# Patient Record
Sex: Male | Born: 1960 | Race: White | Hispanic: No | Marital: Married | Smoking: Never smoker
Health system: Southern US, Community
[De-identification: ages and names within clinical notes are randomized; demographics above are authoritative.]

## PROBLEM LIST (undated history)

## (undated) DIAGNOSIS — I2510 Atherosclerotic heart disease of native coronary artery without angina pectoris: Secondary | ICD-10-CM

## (undated) DIAGNOSIS — F445 Conversion disorder with seizures or convulsions: Secondary | ICD-10-CM

## (undated) DIAGNOSIS — K219 Gastro-esophageal reflux disease without esophagitis: Secondary | ICD-10-CM

## (undated) DIAGNOSIS — I69328 Other speech and language deficits following cerebral infarction: Secondary | ICD-10-CM

## (undated) DIAGNOSIS — E538 Deficiency of other specified B group vitamins: Secondary | ICD-10-CM

## (undated) DIAGNOSIS — T8859XA Other complications of anesthesia, initial encounter: Secondary | ICD-10-CM

## (undated) DIAGNOSIS — I499 Cardiac arrhythmia, unspecified: Secondary | ICD-10-CM

## (undated) DIAGNOSIS — Z923 Personal history of irradiation: Secondary | ICD-10-CM

## (undated) DIAGNOSIS — F449 Dissociative and conversion disorder, unspecified: Secondary | ICD-10-CM

## (undated) DIAGNOSIS — I82409 Acute embolism and thrombosis of unspecified deep veins of unspecified lower extremity: Secondary | ICD-10-CM

## (undated) DIAGNOSIS — I639 Cerebral infarction, unspecified: Secondary | ICD-10-CM

## (undated) DIAGNOSIS — J45909 Unspecified asthma, uncomplicated: Secondary | ICD-10-CM

## (undated) DIAGNOSIS — I219 Acute myocardial infarction, unspecified: Secondary | ICD-10-CM

## (undated) DIAGNOSIS — T4145XA Adverse effect of unspecified anesthetic, initial encounter: Secondary | ICD-10-CM

## (undated) DIAGNOSIS — R569 Unspecified convulsions: Secondary | ICD-10-CM

## (undated) DIAGNOSIS — R0600 Dyspnea, unspecified: Secondary | ICD-10-CM

## (undated) DIAGNOSIS — E119 Type 2 diabetes mellitus without complications: Secondary | ICD-10-CM

## (undated) DIAGNOSIS — G4730 Sleep apnea, unspecified: Secondary | ICD-10-CM

## (undated) DIAGNOSIS — R251 Tremor, unspecified: Secondary | ICD-10-CM

## (undated) DIAGNOSIS — M792 Neuralgia and neuritis, unspecified: Secondary | ICD-10-CM

## (undated) DIAGNOSIS — C189 Malignant neoplasm of colon, unspecified: Secondary | ICD-10-CM

## (undated) DIAGNOSIS — E785 Hyperlipidemia, unspecified: Secondary | ICD-10-CM

## (undated) DIAGNOSIS — Z87442 Personal history of urinary calculi: Secondary | ICD-10-CM

## (undated) HISTORY — PX: TONSILLECTOMY: SUR1361

---
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
## 1988-07-31 DIAGNOSIS — Z87442 Personal history of urinary calculi: Secondary | ICD-10-CM

## 1988-07-31 DIAGNOSIS — I219 Acute myocardial infarction, unspecified: Secondary | ICD-10-CM

## 1988-07-31 DIAGNOSIS — C189 Malignant neoplasm of colon, unspecified: Secondary | ICD-10-CM

## 1988-07-31 HISTORY — DX: Personal history of urinary calculi: Z87.442

## 1988-07-31 HISTORY — DX: Acute myocardial infarction, unspecified: I21.9

## 1988-07-31 HISTORY — DX: Malignant neoplasm of colon, unspecified: C18.9

## 2010-04-06 ENCOUNTER — Inpatient Hospital Stay: Payer: Self-pay | Admitting: Internal Medicine

## 2010-04-28 ENCOUNTER — Ambulatory Visit: Payer: Self-pay | Admitting: Family Medicine

## 2010-05-09 ENCOUNTER — Ambulatory Visit: Payer: Self-pay | Admitting: Internal Medicine

## 2010-12-13 ENCOUNTER — Inpatient Hospital Stay: Payer: Self-pay | Admitting: Family Medicine

## 2011-04-07 ENCOUNTER — Emergency Department: Payer: Self-pay | Admitting: *Deleted

## 2011-08-06 ENCOUNTER — Emergency Department: Payer: Self-pay | Admitting: Emergency Medicine

## 2011-08-06 LAB — CBC
HCT: 46.8 % (ref 40.0–52.0)
HGB: 16 g/dL (ref 13.0–18.0)
MCH: 31.7 pg (ref 26.0–34.0)
MCHC: 34.1 g/dL (ref 32.0–36.0)
MCV: 93 fL (ref 80–100)
Platelet: 249 10*3/uL (ref 150–440)
RBC: 5.04 10*6/uL (ref 4.40–5.90)

## 2011-08-06 LAB — COMPREHENSIVE METABOLIC PANEL
Albumin: 4 g/dL (ref 3.4–5.0)
Anion Gap: 14 (ref 7–16)
BUN: 16 mg/dL (ref 7–18)
Bilirubin,Total: 0.5 mg/dL (ref 0.2–1.0)
Chloride: 104 mmol/L (ref 98–107)
Creatinine: 1.36 mg/dL — ABNORMAL HIGH (ref 0.60–1.30)
EGFR (African American): >60
EGFR (Non-African Amer.): 59 — ABNORMAL LOW
Glucose: 111 mg/dL — ABNORMAL HIGH (ref 65–99)
Osmolality: 279 (ref 275–301)
Potassium: 3.7 mmol/L (ref 3.5–5.1)
SGOT(AST): 24 U/L (ref 15–37)
Sodium: 139 mmol/L (ref 136–145)
Total Protein: 7.9 g/dL (ref 6.4–8.2)

## 2011-08-06 LAB — MAGNESIUM: Magnesium: 1.7 mg/dL — ABNORMAL LOW

## 2011-08-06 LAB — RAPID INFLUENZA A&B ANTIGENS

## 2011-08-06 LAB — CK TOTAL AND CKMB (NOT AT ARMC): CK-MB: <0.5 ng/mL — ABNORMAL LOW (ref 0.5–3.6)

## 2011-08-06 LAB — TROPONIN I: Troponin-I: <0.02 ng/mL

## 2012-01-07 ENCOUNTER — Emergency Department: Payer: Self-pay | Admitting: Emergency Medicine

## 2012-01-07 LAB — COMPREHENSIVE METABOLIC PANEL
Albumin: 3.6 g/dL (ref 3.4–5.0)
Alkaline Phosphatase: 61 U/L (ref 50–136)
Anion Gap: 12 (ref 7–16)
BUN: 10 mg/dL (ref 7–18)
Bilirubin,Total: 0.4 mg/dL (ref 0.2–1.0)
Chloride: 106 mmol/L (ref 98–107)
Co2: 22 mmol/L (ref 21–32)
Creatinine: 1.07 mg/dL (ref 0.60–1.30)
Osmolality: 277 (ref 275–301)
SGPT (ALT): 37 U/L
Sodium: 140 mmol/L (ref 136–145)
Total Protein: 6.7 g/dL (ref 6.4–8.2)

## 2012-01-07 LAB — URINALYSIS, COMPLETE
Bacteria: NONE SEEN
Glucose,UR: NEGATIVE mg/dL (ref 0–75)
Leukocyte Esterase: NEGATIVE
Nitrite: NEGATIVE
Ph: 7 (ref 4.5–8.0)
Protein: NEGATIVE
RBC,UR: NONE SEEN /HPF (ref 0–5)
Specific Gravity: 1.014 (ref 1.003–1.030)
Squamous Epithelial: NONE SEEN
WBC UR: <1 /HPF (ref 0–5)

## 2012-01-07 LAB — CBC
HCT: 41.1 % (ref 40.0–52.0)
MCH: 30.9 pg (ref 26.0–34.0)
MCHC: 33.4 g/dL (ref 32.0–36.0)
MCV: 92 fL (ref 80–100)
Platelet: 224 10*3/uL (ref 150–440)
RBC: 4.45 10*6/uL (ref 4.40–5.90)
WBC: 7 10*3/uL (ref 3.8–10.6)

## 2012-01-07 LAB — TROPONIN I: Troponin-I: <0.02 ng/mL

## 2012-08-20 ENCOUNTER — Emergency Department: Payer: Self-pay | Admitting: Emergency Medicine

## 2012-08-20 LAB — BASIC METABOLIC PANEL
Anion Gap: 10 (ref 7–16)
BUN: 12 mg/dL (ref 7–18)
Calcium, Total: 9 mg/dL (ref 8.5–10.1)
Chloride: 108 mmol/L — ABNORMAL HIGH (ref 98–107)
Co2: 23 mmol/L (ref 21–32)
Creatinine: 1.17 mg/dL (ref 0.60–1.30)
EGFR (African American): >60
EGFR (Non-African Amer.): >60
Glucose: 70 mg/dL (ref 65–99)
Osmolality: 279 (ref 275–301)
Sodium: 141 mmol/L (ref 136–145)

## 2012-08-20 LAB — CBC WITH DIFFERENTIAL/PLATELET
Basophil #: 0.1 10*3/uL (ref 0.0–0.1)
Eosinophil %: 1.7 %
HCT: 44.7 % (ref 40.0–52.0)
HGB: 15.2 g/dL (ref 13.0–18.0)
Lymphocyte #: 2.3 10*3/uL (ref 1.0–3.6)
Lymphocyte %: 26.8 %
MCH: 31.2 pg (ref 26.0–34.0)
MCHC: 34.1 g/dL (ref 32.0–36.0)
MCV: 92 fL (ref 80–100)
Monocyte #: 0.9 x10 3/mm (ref 0.2–1.0)
Monocyte %: 10.1 %
Neutrophil %: 60.1 %
RDW: 13.4 % (ref 11.5–14.5)
WBC: 8.6 10*3/uL (ref 3.8–10.6)

## 2012-08-20 LAB — URINALYSIS, COMPLETE
Bacteria: NONE SEEN
Bilirubin,UR: NEGATIVE
Glucose,UR: 50 mg/dL (ref 0–75)
Nitrite: NEGATIVE
Ph: 6 (ref 4.5–8.0)
Protein: NEGATIVE
Specific Gravity: 1.021 (ref 1.003–1.030)
WBC UR: 2 /HPF (ref 0–5)

## 2012-08-20 LAB — DRUG SCREEN, URINE
Barbiturates, Ur Screen: NEGATIVE (ref ?–200)
Benzodiazepine, Ur Scrn: POSITIVE (ref ?–200)
Cocaine Metabolite,Ur ~~LOC~~: NEGATIVE (ref ?–300)
Methadone, Ur Screen: NEGATIVE (ref ?–300)
Phencyclidine (PCP) Ur S: NEGATIVE (ref ?–25)
Tricyclic, Ur Screen: POSITIVE (ref ?–1000)

## 2013-01-15 ENCOUNTER — Emergency Department: Payer: Self-pay

## 2013-01-15 LAB — URINALYSIS, COMPLETE
Bilirubin,UR: NEGATIVE
Blood: NEGATIVE
Glucose,UR: NEGATIVE mg/dL (ref 0–75)
Protein: NEGATIVE
Specific Gravity: 1.01 (ref 1.003–1.030)
Squamous Epithelial: <1
WBC UR: 2 /HPF (ref 0–5)

## 2013-01-15 LAB — TROPONIN I: Troponin-I: <0.02 ng/mL

## 2013-01-15 LAB — CBC
HCT: 44.2 % (ref 40.0–52.0)
MCHC: 34.3 g/dL (ref 32.0–36.0)
Platelet: 233 10*3/uL (ref 150–440)
RBC: 4.9 10*6/uL (ref 4.40–5.90)
WBC: 7.6 10*3/uL (ref 3.8–10.6)

## 2013-01-15 LAB — COMPREHENSIVE METABOLIC PANEL
Alkaline Phosphatase: 69 U/L (ref 50–136)
Anion Gap: 8 (ref 7–16)
Bilirubin,Total: 0.7 mg/dL (ref 0.2–1.0)
Chloride: 107 mmol/L (ref 98–107)
Co2: 25 mmol/L (ref 21–32)
EGFR (Non-African Amer.): >60
Glucose: 81 mg/dL (ref 65–99)
Osmolality: 278 (ref 275–301)
Potassium: 4 mmol/L (ref 3.5–5.1)
SGOT(AST): 14 U/L — ABNORMAL LOW (ref 15–37)
Sodium: 140 mmol/L (ref 136–145)
Total Protein: 7.3 g/dL (ref 6.4–8.2)

## 2013-01-15 LAB — AMMONIA: Ammonia, Plasma: 25 mcmol/L (ref 11–32)

## 2013-02-20 ENCOUNTER — Emergency Department: Payer: Self-pay | Admitting: Emergency Medicine

## 2013-02-20 LAB — CBC
HCT: 43.9 % (ref 40.0–52.0)
HGB: 15 g/dL (ref 13.0–18.0)
MCV: 91 fL (ref 80–100)
Platelet: 211 10*3/uL (ref 150–440)
RBC: 4.84 10*6/uL (ref 4.40–5.90)
WBC: 8 10*3/uL (ref 3.8–10.6)

## 2013-02-20 LAB — COMPREHENSIVE METABOLIC PANEL
Bilirubin,Total: 0.5 mg/dL (ref 0.2–1.0)
Calcium, Total: 9.7 mg/dL (ref 8.5–10.1)
Chloride: 103 mmol/L (ref 98–107)
Co2: 22 mmol/L (ref 21–32)
EGFR (African American): >60
Potassium: 3.7 mmol/L (ref 3.5–5.1)
SGOT(AST): 20 U/L (ref 15–37)
Sodium: 136 mmol/L (ref 136–145)

## 2013-02-20 LAB — TROPONIN I: Troponin-I: <0.02 ng/mL

## 2013-02-20 LAB — SEDIMENTATION RATE: Erythrocyte Sed Rate: 4 mm/hr (ref 0–20)

## 2013-02-20 LAB — APTT: Activated PTT: 26.6 secs (ref 23.6–35.9)

## 2013-02-20 LAB — PROTIME-INR: INR: 0.9

## 2013-11-22 ENCOUNTER — Ambulatory Visit: Payer: Self-pay | Admitting: Physician Assistant

## 2013-11-26 ENCOUNTER — Emergency Department: Payer: Self-pay | Admitting: Emergency Medicine

## 2013-11-26 LAB — BASIC METABOLIC PANEL
Anion Gap: 11 (ref 7–16)
BUN: 11 mg/dL (ref 7–18)
Calcium, Total: 9.3 mg/dL (ref 8.5–10.1)
Chloride: 100 mmol/L (ref 98–107)
Co2: 23 mmol/L (ref 21–32)
Creatinine: 1.03 mg/dL (ref 0.60–1.30)
EGFR (African American): >60
EGFR (Non-African Amer.): >60
GLUCOSE: 231 mg/dL — AB (ref 65–99)
OSMOLALITY: 275 (ref 275–301)
Potassium: 4.1 mmol/L (ref 3.5–5.1)
Sodium: 134 mmol/L — ABNORMAL LOW (ref 136–145)

## 2013-11-26 LAB — CBC
HCT: 49 % (ref 40.0–52.0)
HGB: 15.9 g/dL (ref 13.0–18.0)
MCH: 30.3 pg (ref 26.0–34.0)
MCHC: 32.4 g/dL (ref 32.0–36.0)
MCV: 93 fL (ref 80–100)
PLATELETS: 231 10*3/uL (ref 150–440)
RBC: 5.25 10*6/uL (ref 4.40–5.90)
RDW: 12.9 % (ref 11.5–14.5)
WBC: 8 10*3/uL (ref 3.8–10.6)

## 2013-11-26 LAB — URINALYSIS, COMPLETE
BILIRUBIN, UR: NEGATIVE
Bacteria: NONE SEEN
Glucose,UR: >=500 mg/dL (ref 0–75)
KETONE: NEGATIVE
Leukocyte Esterase: NEGATIVE
Nitrite: NEGATIVE
PH: 7 (ref 4.5–8.0)
PROTEIN: NEGATIVE
RBC,UR: NONE SEEN /HPF (ref 0–5)
Specific Gravity: 1.005 (ref 1.003–1.030)
Squamous Epithelial: NONE SEEN
WBC UR: <1 /HPF (ref 0–5)

## 2013-11-26 LAB — CARBAMAZEPINE LEVEL, TOTAL

## 2013-11-26 LAB — PHENYTOIN LEVEL, TOTAL: DILANTIN: 2 ug/mL — AB (ref 10.0–20.0)

## 2013-11-26 LAB — VALPROIC ACID LEVEL: Valproic Acid: <3 ug/mL — ABNORMAL LOW

## 2013-11-26 LAB — PHENOBARBITAL LEVEL: Phenobarbital: <2.1 ug/mL — ABNORMAL LOW (ref 15.0–40.0)

## 2014-01-19 ENCOUNTER — Emergency Department: Payer: Self-pay | Admitting: Emergency Medicine

## 2014-01-19 ENCOUNTER — Inpatient Hospital Stay (HOSPITAL_COMMUNITY)
Admission: AD | Admit: 2014-01-19 | Discharge: 2014-01-21 | DRG: 069 | Disposition: A | Discharge status: Home / Self Care | Payer: Medicare Other | Source: Other Acute Inpatient Hospital | Attending: Neurology | Admitting: Neurology

## 2014-01-19 ENCOUNTER — Inpatient Hospital Stay (HOSPITAL_COMMUNITY): Payer: Medicare Other

## 2014-01-19 ENCOUNTER — Encounter (HOSPITAL_COMMUNITY): Payer: Self-pay | Admitting: Neurology

## 2014-01-19 DIAGNOSIS — Z6832 Body mass index (BMI) 32.0-32.9, adult: Secondary | ICD-10-CM

## 2014-01-19 DIAGNOSIS — R4701 Aphasia: Secondary | ICD-10-CM | POA: Diagnosis present

## 2014-01-19 DIAGNOSIS — E119 Type 2 diabetes mellitus without complications: Secondary | ICD-10-CM | POA: Diagnosis present

## 2014-01-19 DIAGNOSIS — I252 Old myocardial infarction: Secondary | ICD-10-CM

## 2014-01-19 DIAGNOSIS — R29898 Other symptoms and signs involving the musculoskeletal system: Secondary | ICD-10-CM | POA: Diagnosis present

## 2014-01-19 DIAGNOSIS — G459 Transient cerebral ischemic attack, unspecified: Secondary | ICD-10-CM | POA: Diagnosis present

## 2014-01-19 DIAGNOSIS — I6330 Cerebral infarction due to thrombosis of unspecified cerebral artery: Secondary | ICD-10-CM | POA: Insufficient documentation

## 2014-01-19 DIAGNOSIS — I10 Essential (primary) hypertension: Secondary | ICD-10-CM | POA: Diagnosis present

## 2014-01-19 DIAGNOSIS — G4730 Sleep apnea, unspecified: Secondary | ICD-10-CM | POA: Diagnosis present

## 2014-01-19 DIAGNOSIS — R569 Unspecified convulsions: Secondary | ICD-10-CM | POA: Diagnosis present

## 2014-01-19 DIAGNOSIS — G458 Other transient cerebral ischemic attacks and related syndromes: Secondary | ICD-10-CM

## 2014-01-19 DIAGNOSIS — Z85038 Personal history of other malignant neoplasm of large intestine: Secondary | ICD-10-CM | POA: Diagnosis not present

## 2014-01-19 DIAGNOSIS — I2510 Atherosclerotic heart disease of native coronary artery without angina pectoris: Secondary | ICD-10-CM | POA: Diagnosis present

## 2014-01-19 DIAGNOSIS — E538 Deficiency of other specified B group vitamins: Secondary | ICD-10-CM | POA: Diagnosis present

## 2014-01-19 DIAGNOSIS — Z820 Family history of epilepsy and other diseases of the nervous system: Secondary | ICD-10-CM

## 2014-01-19 DIAGNOSIS — E785 Hyperlipidemia, unspecified: Secondary | ICD-10-CM | POA: Diagnosis present

## 2014-01-19 DIAGNOSIS — E669 Obesity, unspecified: Secondary | ICD-10-CM | POA: Diagnosis present

## 2014-01-19 DIAGNOSIS — I69998 Other sequelae following unspecified cerebrovascular disease: Secondary | ICD-10-CM

## 2014-01-19 HISTORY — DX: Sleep apnea, unspecified: G47.30

## 2014-01-19 HISTORY — DX: Type 2 diabetes mellitus without complications: E11.9

## 2014-01-19 HISTORY — DX: Atherosclerotic heart disease of native coronary artery without angina pectoris: I25.10

## 2014-01-19 HISTORY — DX: Deficiency of other specified B group vitamins: E53.8

## 2014-01-19 HISTORY — DX: Hyperlipidemia, unspecified: E78.5

## 2014-01-19 HISTORY — DX: Cerebral infarction, unspecified: I63.9

## 2014-01-19 HISTORY — DX: Malignant neoplasm of colon, unspecified: C18.9

## 2014-01-19 HISTORY — DX: Conversion disorder with seizures or convulsions: F44.5

## 2014-01-19 LAB — URINALYSIS, COMPLETE
BLOOD: NEGATIVE
Bacteria: NONE SEEN
Bilirubin,UR: NEGATIVE
Ketone: NEGATIVE
LEUKOCYTE ESTERASE: NEGATIVE
NITRITE: NEGATIVE
PROTEIN: NEGATIVE
Ph: 6 (ref 4.5–8.0)
RBC,UR: <1 /HPF (ref 0–5)
Specific Gravity: 1.023 (ref 1.003–1.030)
Squamous Epithelial: NONE SEEN
WBC UR: NONE SEEN /HPF (ref 0–5)

## 2014-01-19 LAB — COMPREHENSIVE METABOLIC PANEL
ALBUMIN: 3.5 g/dL (ref 3.4–5.0)
ALT: 29 U/L (ref 12–78)
ANION GAP: 7 (ref 7–16)
AST: 25 U/L (ref 15–37)
Alkaline Phosphatase: 74 U/L
BUN: 13 mg/dL (ref 7–18)
Bilirubin,Total: 0.5 mg/dL (ref 0.2–1.0)
CALCIUM: 9.6 mg/dL (ref 8.5–10.1)
Chloride: 107 mmol/L (ref 98–107)
Co2: 26 mmol/L (ref 21–32)
Creatinine: 1.06 mg/dL (ref 0.60–1.30)
Glucose: 141 mg/dL — ABNORMAL HIGH (ref 65–99)
Osmolality: 282 (ref 275–301)
POTASSIUM: 3.9 mmol/L (ref 3.5–5.1)
Sodium: 140 mmol/L (ref 136–145)
TOTAL PROTEIN: 6.8 g/dL (ref 6.4–8.2)

## 2014-01-19 LAB — CBC WITH DIFFERENTIAL/PLATELET
BASOS PCT: 0.9 %
Basophil #: 0.1 10*3/uL (ref 0.0–0.1)
Eosinophil #: 0.1 10*3/uL (ref 0.0–0.7)
Eosinophil %: 1.5 %
HCT: 46.6 % (ref 40.0–52.0)
HGB: 15.1 g/dL (ref 13.0–18.0)
LYMPHS ABS: 1.9 10*3/uL (ref 1.0–3.6)
LYMPHS PCT: 30.1 %
MCH: 30.6 pg (ref 26.0–34.0)
MCHC: 32.4 g/dL (ref 32.0–36.0)
MCV: 95 fL (ref 80–100)
Monocyte #: 0.7 x10 3/mm (ref 0.2–1.0)
Monocyte %: 11.1 %
Neutrophil #: 3.5 10*3/uL (ref 1.4–6.5)
Neutrophil %: 56.4 %
PLATELETS: 198 10*3/uL (ref 150–440)
RBC: 4.93 10*6/uL (ref 4.40–5.90)
RDW: 13.3 % (ref 11.5–14.5)
WBC: 6.3 10*3/uL (ref 3.8–10.6)

## 2014-01-19 LAB — TROPONIN I: Troponin-I: <0.02 ng/mL

## 2014-01-19 LAB — PROTIME-INR
INR: 0.9
Prothrombin Time: 12.5 secs (ref 11.5–14.7)

## 2014-01-19 LAB — MRSA PCR SCREENING: MRSA BY PCR: NEGATIVE

## 2014-01-19 LAB — GLUCOSE, CAPILLARY: GLUCOSE-CAPILLARY: 98 mg/dL (ref 70–99)

## 2014-01-19 MED ORDER — BIOTENE DRY MOUTH MT LIQD
15.0000 mL | Freq: Two times a day (BID) | OROMUCOSAL | Status: DC
Start: 2014-01-20 — End: 2014-01-20
  Administered 2014-01-20: 15 mL via OROMUCOSAL

## 2014-01-19 MED ORDER — INSULIN ASPART 100 UNIT/ML ~~LOC~~ SOLN: 0.0000 [IU] | Freq: Three times a day (TID) | SUBCUTANEOUS | Status: DC

## 2014-01-19 MED ORDER — PANTOPRAZOLE SODIUM 40 MG IV SOLR
40.0000 mg | Freq: Every day | INTRAVENOUS | Status: DC
  Administered 2014-01-19 – 2014-01-20 (×2): 40 mg via INTRAVENOUS
  Filled 2014-01-19 (×3): qty 40

## 2014-01-19 MED ORDER — SODIUM CHLORIDE 0.9 % IV SOLN
INTRAVENOUS | Status: DC
  Administered 2014-01-19 – 2014-01-21 (×3): via INTRAVENOUS

## 2014-01-19 MED ORDER — LABETALOL HCL 5 MG/ML IV SOLN: 10.0000 mg | INTRAVENOUS | Status: DC | PRN

## 2014-01-19 MED ORDER — SENNOSIDES-DOCUSATE SODIUM 8.6-50 MG PO TABS
1.0000 | ORAL_TABLET | Freq: Every evening | ORAL | Status: DC | PRN
  Filled 2014-01-19: qty 1

## 2014-01-19 MED ORDER — ACETAMINOPHEN 325 MG PO TABS: 650.0000 mg | ORAL_TABLET | ORAL | Status: DC | PRN

## 2014-01-19 MED ORDER — ACETAMINOPHEN 650 MG RE SUPP
650.0000 mg | RECTAL | Status: DC | PRN
  Administered 2014-01-19: 650 mg via RECTAL
  Filled 2014-01-19: qty 1

## 2014-01-19 MED ORDER — INSULIN ASPART 100 UNIT/ML ~~LOC~~ SOLN
0.0000 [IU] | SUBCUTANEOUS | Status: DC
  Administered 2014-01-20: 3 [IU] via SUBCUTANEOUS

## 2014-01-19 NOTE — H&P (Signed)
Admission H&P    Chief Complaint: Unable to speak HPI: Michael Gill is an 53 y.o. male with a history of stroke and pseudoseizures who was not feeling well today.  He had complaints of ha headache and numbness on the left side of his face which is his usual presentation for his pseudoseizures.  He then usually has weakness on the right side and starts to jerk.  Today instead of the jerking he became unable to speak.  It seems gait was affected as well.  Patient was evaluated at [facility] ED and IV tPA was administered.  Patient now transferred to [facility] for further care.  Modified NIHSS of 10.   The patient has a history of stroke three years ago with right sided weakness and effects on cognitive function.   went to the doctor's office and was able to ambulate with a walker and was able to talk  Date last known well: Date: 01/19/2014 Time last known well: Time: 15:45 tPA Given: Yes  Past Medical History  Diagnosis Date  . Pseudoseizure   . Diabetes mellitus   . Hyperlipidemia   . CVA (cerebral infarction)   . Colon cancer   . Sleep apnea   . B12 deficiency   . CAD (coronary artery disease)     s/p MI    Past Surgical History  Procedure Laterality Date  . Appendectomy    . Tonsillectomy     1 Family History  Problem Relation Age of Onset  . Cancer Brother   . Alzheimer's disease Mother     Social History:  reports that he has never smoked. He does not have any smokeless tobacco history on file. He reports that he does not drink alcohol or use illicit drugs.  Allergies: Codeine, sulfa drugs  Medications: Amitriptyline, ASA 81mg , Atorvastatin, Humalog, Loratadine, Losartan, Metformin, Trazadone    ROS: Unable to obtain-patient mute  Physical Examination: Blood pressure 126/78, pulse 79, temperature 98.2 F (36.8 C), temperature source Oral, resp. rate 16, height 5\' 10"  (1.778 m), weight 102 kg (224 lb 13.9 oz), SpO2 94.00%.  General Examination: HEENT-   Normocephalic, no lesions, without obvious abnormality.  Normal external eye and conjunctiva.  Normal TM's bilaterally.  Normal auditory canals and external ears. Normal external nose, mucus membranes and septum.  Normal pharynx. Neck supple with no masses, nodes, nodules or enlargement. Cardiovascular - S1, S2 normal Lungs - chest clear, no wheezing, rales, normal symmetric air entry Abdomen - soft, non-tender; bowel sounds normal; no masses,  no organomegaly Extremities - no edema  Neurologic Examination: Mental Status: Eyes closed.  Shakes head that he is unable to open his eyes.  Mute.  Follows simple commands.  Nods head in response to questioning.     Cranial Nerves: II: Discs flat bilaterally; Blinks to bilateral confrontation, pupils equal, round, reactive to light and accommodation III,IV, VI: Extra-ocular motions grossly intact bilaterally V,VII: Corneal intact bilaterally.  Unable to assess for facial droop VIII: hearing normal bilaterally IX,X: gag reflex present XI: bilateral shoulder shrug unable to be tested XII: midline tongue extension unable to be tested Motor: Able to hold left arm and leg off the bed.  Unable to left the right but makes attempt.  When the right arm is actively lifted the patient does not have flexing at the elbow but arm drops back down to the bed.  Right leg falls to the bed.   Sensory: Pinprick and light touch intact throughout, bilaterally Deep Tendon Reflexes:  2+ in the UE's, 1+ at the knees and absent at the ankles Plantars: Right: downgoing   Left: downgoing Cerebellar: Finger-to-nose  and normal heel-to-shin testing unable to be performed Gait: Unable to test CV: pulses palpable throughout     Laboratory Studies:  Lab work from [employer] [employer] reviewed including CBC, CMET, Coags, UA and troponin.  All were unremarkable.    Basic Metabolic Panel: No results found for this basename: NA, K, CL, CO2, GLUCOSE, BUN, CREATININE, CALCIUM, MG, PHOS,  in  the last 168 hours  Liver Function Tests: No results found for this basename: AST, ALT, ALKPHOS, BILITOT, PROT, ALBUMIN,  in the last 168 hours No results found for this basename: LIPASE, AMYLASE,  in the last 168 hours No results found for this basename: AMMONIA,  in the last 168 hours  CBC: No results found for this basename: WBC, NEUTROABS, HGB, HCT, MCV, PLT,  in the last 168 hours  Cardiac Enzymes: No results found for this basename: CKTOTAL, CKMB, CKMBINDEX, TROPONINI,  in the last 168 hours  BNP: No components found with this basename: POCBNP,   CBG: No results found for this basename: GLUCAP,  in the last 168 hours  Microbiology: No results found for this or any previous visit.  Coagulation Studies: No results found for this basename: LABPROT, INR,  in the last 72 hours  Urinalysis: No results found for this basename: COLORURINE, APPERANCEUR, LABSPEC, PHURINE, GLUCOSEU, HGBUR, BILIRUBINUR, KETONESUR, PROTEINUR, UROBILINOGEN, NITRITE, LEUKOCYTESUR,  in the last 168 hours  Lipid Panel:  No results found for this basename: chol,  trig,  hdl,  cholhdl,  vldl,  ldlcalc    HgbA1C:  No results found for this basename: HGBA1C    Urine Drug Screen:   No results found for this basename: labopia,  cocainscrnur,  labbenz,  amphetmu,  thcu,  labbarb    Alcohol Level: No results found for this basename: ETH,  in the last 168 hours  Other results: EKG: normal sinus rhythm at 83 bpm.  Imaging: CT head: No acute changes.  Assessment: 53 y.o. male with history of stroke presenting with acute onset loss of speech.  TPA administered at [facility] and patient transferred here for further management.  Patient on ASA at home.    Stroke Risk Factors - diabetes mellitus and hyperlipidemia  Plan: 1. HgbA1c, fasting lipid panel 2. MRI, MRA  of the brain without contrast 3. PT consult, OT consult, Speech consult 4. Echocardiogram 5. Carotid dopplers 6. Prophylactic therapy-None 7. Risk  factor modification 8. Telemetry monitoring 9. Frequent neuro checks 10. Admission to ICU 11. Repeat head CT in 24 hours  This patient is critically ill and at significant risk of neurological worsening, death and care requires constant monitoring of vital signs, hemodynamics,respiratory and cardiac monitoring, neurological assessment, discussion with family, other specialists and medical decision making of high complexity. I spent 60 minutes of neurocritical care time  in the care of  this patient.   Alexis Goodell, MD [facility] [phone] 01/19/2014, 10:06 PM

## 2014-01-20 ENCOUNTER — Inpatient Hospital Stay (HOSPITAL_COMMUNITY): Payer: Medicare Other

## 2014-01-20 DIAGNOSIS — I6350 Cerebral infarction due to unspecified occlusion or stenosis of unspecified cerebral artery: Secondary | ICD-10-CM

## 2014-01-20 DIAGNOSIS — R4701 Aphasia: Secondary | ICD-10-CM

## 2014-01-20 DIAGNOSIS — R269 Unspecified abnormalities of gait and mobility: Secondary | ICD-10-CM

## 2014-01-20 DIAGNOSIS — I517 Cardiomegaly: Secondary | ICD-10-CM

## 2014-01-20 LAB — GLUCOSE, CAPILLARY
GLUCOSE-CAPILLARY: 166 mg/dL — AB (ref 70–99)
GLUCOSE-CAPILLARY: 179 mg/dL — AB (ref 70–99)
Glucose-Capillary: 92 mg/dL (ref 70–99)
Glucose-Capillary: 101 mg/dL — ABNORMAL HIGH (ref 70–99)
Glucose-Capillary: 111 mg/dL — ABNORMAL HIGH (ref 70–99)
Glucose-Capillary: 179 mg/dL — ABNORMAL HIGH (ref 70–99)
Glucose-Capillary: 165 mg/dL — ABNORMAL HIGH (ref 70–99)

## 2014-01-20 LAB — TROPONIN I
Troponin I: <0.3 ng/mL (ref <0.30)
Troponin I: <0.3 ng/mL (ref <0.30)

## 2014-01-20 LAB — LIPID PANEL
Cholesterol: 107 mg/dL (ref 0–200)
HDL: 38 mg/dL — ABNORMAL LOW (ref >39)
LDL CALC: 50 mg/dL (ref 0–99)
Total CHOL/HDL Ratio: 2.8 RATIO
Triglycerides: 97 mg/dL (ref <150)
VLDL: 19 mg/dL (ref 0–40)

## 2014-01-20 LAB — HEMOGLOBIN A1C
Hgb A1c MFr Bld: 10.4 % — ABNORMAL HIGH (ref <5.7)
MEAN PLASMA GLUCOSE: 252 mg/dL — AB (ref <117)

## 2014-01-20 MED ORDER — INSULIN ASPART 100 UNIT/ML ~~LOC~~ SOLN
0.0000 [IU] | Freq: Three times a day (TID) | SUBCUTANEOUS | Status: DC
  Administered 2014-01-20: 3 [IU] via SUBCUTANEOUS
  Administered 2014-01-21: 2 [IU] via SUBCUTANEOUS

## 2014-01-20 NOTE — Progress Notes (Signed)
VASCULAR LAB PRELIMINARY  PRELIMINARY  PRELIMINARY  PRELIMINARY  Carotid duplex  completed.    Preliminary report:  Bilateral:  1-39% ICA stenosis.  Vertebral artery flow is antegrade.      Jiayi Lengacher, RVT 01/20/2014, 11:33 AM

## 2014-01-20 NOTE — Progress Notes (Signed)
Pt began having c/o nausea without vomiting.  Pt A&O x 4.  V/S/S.  MD (Neuro-Reynolds) notified and ordered stat head CT.  Will continue to monitor.

## 2014-01-20 NOTE — Progress Notes (Signed)
Stroke Team Progress Note  HISTORY Chief Complaint: Unable to speak  HPI: Michael Gill is an 53 y.o. male with a history of stroke and pseudoseizures who was not feeling well today. He had complaints of ha headache and numbness on the left side of his face which is his usual presentation for his pseudoseizures. He then usually has weakness on the right side and starts to jerk. Today instead of the jerking he became unable to speak. It seems gait was affected as well. Patient was evaluated at [facility] ED and IV tPA was administered. Patient now transferred to [facility] for further care. Modified NIHSS of 10.  The patient has a history of stroke three years ago with right sided weakness and effects on cognitive function. went to the doctor's office and was able to ambulate with a walker and was able to talk  Date last known well: Date: 01/19/2014  Time last known well: Time: 15:45  tPA Given: Yes    He was admitted to the neuro ICU for further evaluation and treatment.  SUBJECTIVE The patient is alert and cooperative, no family at bedside. The patient complains of some chest pain on the left side, some headache.  OBJECTIVE Most recent Vital Signs: Filed Vitals:   01/20/14 0430 01/20/14 0500 01/20/14 0600 01/20/14 0746  BP: 122/77 117/78 127/76   Pulse: 76 72 76   Temp:    97.5 F (36.4 C)  TempSrc:    Oral  Resp: 15 13 13    Height:      Weight:      SpO2: 96% 96% 97%    CBG (last 3)   Recent Labs  01/19/14 2241 01/19/14 2327 01/20/14 0317  GLUCAP 98 92 101*    IV Fluid Intake:   . sodium chloride 75 mL/hr at 01/20/14 0600    MEDICATIONS  . antiseptic oral rinse  15 mL Mouth Rinse BID  . insulin aspart  0-15 Units Subcutaneous 6 times per day  . pantoprazole (PROTONIX) IV  40 mg Intravenous QHS   PRN:  acetaminophen, acetaminophen, labetalol, senna-docusate  Diet:  NPO  liquids Activity:  Bedrest DVT Prophylaxis: none, got tPA  CLINICALLY SIGNIFICANT STUDIES Basic  Metabolic Panel: No results found for this basename: NA, K, CL, CO2, GLUCOSE, BUN, CREATININE, CALCIUM, MG, PHOS,  in the last 168 hours Liver Function Tests: No results found for this basename: AST, ALT, ALKPHOS, BILITOT, PROT, ALBUMIN,  in the last 168 hours CBC: No results found for this basename: WBC, NEUTROABS, HGB, HCT, MCV, PLT,  in the last 168 hours Coagulation: No results found for this basename: LABPROT, INR,  in the last 168 hours Cardiac Enzymes:  Recent Labs Lab 01/20/14 0115 01/20/14 0630  TROPONINI <0.30 <0.30   Urinalysis: No results found for this basename: COLORURINE, APPERANCEUR, LABSPEC, PHURINE, GLUCOSEU, HGBUR, BILIRUBINUR, KETONESUR, PROTEINUR, UROBILINOGEN, NITRITE, LEUKOCYTESUR,  in the last 168 hours Lipid Panel    Component Value Date/Time   CHOL 107 01/20/2014 0115   TRIG 97 01/20/2014 0115   HDL 38* 01/20/2014 0115   CHOLHDL 2.8 01/20/2014 0115   VLDL 19 01/20/2014 0115   LDLCALC 50 01/20/2014 0115   HgbA1C  No results found for this basename: HGBA1C    Urine Drug Screen:   No results found for this basename: labopia, cocainscrnur, labbenz, amphetmu, thcu, labbarb    Alcohol Level: No results found for this basename: ETH,  in the last 168 hours  Ct Head Wo Contrast  01/20/2014  CLINICAL DATA:  Stroke. Patient received tPA. Residual left-sided weakness.  EXAM: CT HEAD WITHOUT CONTRAST  TECHNIQUE: Contiguous axial images were obtained from the base of the skull through the vertex without intravenous contrast.  COMPARISON:  01/19/2014.  FINDINGS: Posterior fossa structures appear normal. No hyperdense MCA sign. Insular ribbon appears within normal limits. Gray-white differentiation in the basal ganglia within normal limits bilaterally. No mass lesion, mass effect, midline shift, hydrocephalus, or hemorrhage. No evidence of an acute or subacute infarct. Mastoid air cells clear. Other paranasal sinuses are within normal limits. 13 mm x 12 mm partially visualized  left parotid gland nodule again noted.  No interval change from CT head 01/19/2014.  IMPRESSION: 1. No acute intracranial abnormality or interval change from yesterday's head CT at 1753 hr. 2. Negative for hemorrhage following administration of thrombolytics.   Electronically Signed   By: Dereck Ligas M.D.   On: 01/20/2014 07:21   Dg Chest Port 1 View  01/20/2014   CLINICAL DATA:  Stroke.  EXAM: PORTABLE CHEST - 1 VIEW  COMPARISON:  11/26/2013  FINDINGS: Shallow inspiration. The heart size and mediastinal contours are within normal limits. Both lungs are clear. The visualized skeletal structures are unremarkable.  IMPRESSION: No active disease.   Electronically Signed   By: Lucienne Capers M.D.   On: 01/20/2014 02:03    CT of the brain   IMPRESSION:  1. No acute intracranial abnormality or interval change from  yesterday's head CT at 1753 hr.  2. Negative for hemorrhage following administration of  thrombolytics.  MRI of the brain    MRA of the brain    2D Echocardiogram    Carotid Doppler    CXR   IMPRESSION:  No active disease.  EKG    Normal sinus rhythm Normal ECG No old tracing to compare Therapy Recommendations pending  Physical Exam Blood pressure 127/76, pulse 76, temperature 97.5 F (36.4 C), temperature source Oral, resp. rate 13, height 5\' 10"  (1.778 m), weight 102 kg (224 lb 13.9 oz), SpO2 97.00%. Gen: Patient is well developed, well nourished elderly woman in no acute distress.  Cardiac: RRR. S1S2 audible. No M/R/G.  Extremities: Cap refill <2 secs. No cyanosis or edema. Pulses 2+ radial and DP. Pulmonary: Respirations regular, symmetric. Lungs clear to auscultation bilat. Abd: Soft, non-tender. BS audible x 4 quadrants.  G/U: Deferred  MS: Alert, follows commands. Oriented to person, place, time, and event. Speech: Speech fluent and non-dysarthric. Able to name and repeat. No alexia or agraphia.   CN: No visual field cut. PERRL. EOMs intact. Facial  sensation intact V1-3 on the right, decreased on the left face. No facial droop. Hearing grossly intact. Strong cough. Sternocleidomastoids and trapezius 5/5 strength. Tongue midline, full strength, no atrophy or fasciculations.  Strength: 5/5 in all four extremities proximally and distally. No drift is seen with the arms. Sensation: Intact to light touch in all four extremities.  Coordination: No ataxia or dysmetria on FTN or HTS bilat. Gait was not tested. Proprioception: Negative Romberg.   Reflexes: 2+ biceps, brachioradialis bilat. 2+ patellar, achilles bilat. No clonus. Downgoing toes bilat.    ASSESSMENT Michael Gill is a 53 y.o. male with prior history of stroke and pseudoseizures presenting with left facial numbness and headache. Status post IV t-PA . Stroke work up underway.   Patient in Neuro ICU on post-tPA care pathway  LDL 50  HgbA1C pending  Diabetes  Obesity  History of pseudoseizures  History of prior stroke,  left-sided deficit  Hypertension  Dyslipidemia, on atorvastatin   Hospital day # 1  Currently, the patient has essentially no deficits. The patient has a history pseudoseizures in the past, but he also has risk factors for stroke. Full stroke workup underway.  TREATMENT/PLAN  MRI brain  MRA head  Carotid Doppler study  2-D echocardiogram  Aspirin therapy following 24 hours following TPA  Mobilize patient following 24 hour period post TPA  Speech therapy evaluation  Physical and occupational therapy when appropriate  Restart atorvastatin when able to take oral medication  SIGNED Lenor Coffin   I have personally obtained a history, examined the patient, evaluated imaging results, and formulated the assessment and plan of care. I agree with the above.    To contact Stroke Continuity provider, please refer to [email or web address] After hours, contact General Neurology

## 2014-01-20 NOTE — Progress Notes (Signed)
Pt continues to have c/o headache and chest pain.  Pt does not appear in distress.  V/S remain stable.  MD (Neuro-Reynolds) notified and ordered troponin levels every 6 hrs x 3, starting now.  MD at bedside to assess pt.  Pt's wife and daughter are also at bedside and have been updated.  Per MD, continue prn acetaminophen as needed.  No additional prn pain med ordered at this time.  Will continue to monitor.

## 2014-01-20 NOTE — Progress Notes (Signed)
OT Cancellation Note  Patient Details Name: Michael Gill MRN: [id number] DOB: 11/04/1960   Cancelled Treatment:    Reason Eval/Treat Not Completed: Medical issues which prohibited therapy (strict bedrest x24 hours s/p TPA)  Peri Maris Pager: [phone]  01/20/2014, 8:47 AM

## 2014-01-20 NOTE — Plan of Care (Addendum)
Problem: tPA Day Progression Outcomes-Only if tPA administered Goal: Post tPA image without hemorrhage MRI scheduled for between 1700-1800 01/20/14

## 2014-01-20 NOTE — Progress Notes (Signed)
Pt has c/o headache and chest pain.  Pt was given prn acetaminophen earlier.  V/S/S.  MD (Neuro-Reynolds) notified and ordered an EKG.  Will continue to monitor.

## 2014-01-20 NOTE — Progress Notes (Signed)
Pt able to speak clearly after being mute since admission.  Speech is clear and pt is A&O x 4.  Pt continues to have c/o headache and chest pain.  V/S/S.  MD (Neuro-Reynolds) updated.  No new orders given at this time.  Will continue to monitor.

## 2014-01-20 NOTE — Progress Notes (Signed)
UR completed.  Michelle Bryson, RN BSN MHA CCM Trauma/Neuro ICU Case Manager [phone]  

## 2014-01-20 NOTE — Progress Notes (Signed)
PT Cancellation Note  Patient Details Name: Michael Gill MRN: [id number] DOB: 22-Jul-1961   Cancelled Treatment:    Reason Eval/Treat Not Completed: Patient not medically ready (24 post TPA bedrest)   Duncan Dull 01/20/2014, 12:18 PM Alben Deeds, [place] DPT  [phone]

## 2014-01-20 NOTE — Progress Notes (Signed)
Echocardiogram 2D Echocardiogram has been performed.  Michael Gill 01/20/2014, 3:58 PM

## 2014-01-20 NOTE — Evaluation (Signed)
Speech Language Pathology Evaluation Patient Details Name: Michael Gill MRN: [id number] DOB: 1961-04-30 Today's Date: 01/20/2014 Time: 5885-0277 SLP Time Calculation (min): 8 min  Problem List:  Patient Active Problem List   Diagnosis Date Noted  . Cerebral thrombosis with cerebral infarction 01/19/2014  . Unspecified cerebral artery occlusion with cerebral infarction 01/19/2014   Past Medical History:  Past Medical History  Diagnosis Date  . Pseudoseizure   . Diabetes mellitus   . Hyperlipidemia   . CVA (cerebral infarction)   . Colon cancer   . Sleep apnea   . B12 deficiency   . CAD (coronary artery disease)     s/p MI   Past Surgical History:  Past Surgical History  Procedure Laterality Date  . Appendectomy    . Tonsillectomy     HPI:  53 y.o. male with prior history of stroke and pseudoseizures presenting with left facial numbness and headache. Status post IV t-PA . Stroke work up underway.Initial head CT negative for acute intracranial abnormality   Assessment / Plan / Recommendation Clinical Impression  Patient presents with functional cognitive-linguistic skills. No SLP f/u indicated at this time.     SLP Assessment  Patient does not need any further Speech Lanaguage Pathology Services    Follow Up Recommendations  None       Pertinent Vitals/Pain n/a   SLP Goals  SLP Goals Progress/Goals/Alternative treatment plan discussed with pt/caregiver and they: Agree  SLP Evaluation Prior Functioning  Cognitive/Linguistic Baseline: Within functional limits   Cognition  Overall Cognitive Status: Within Functional Limits for tasks assessed Orientation Level: Oriented X4    Comprehension  Auditory Comprehension Overall Auditory Comprehension: Appears within functional limits for tasks assessed Visual Recognition/Discrimination Discrimination: Within Function Limits Reading Comprehension Reading Status: Not tested    Expression Expression Primary Mode of  Expression: Verbal Verbal Expression Overall Verbal Expression: Appears within functional limits for tasks assessed   Oral / Motor Oral Motor/Sensory Function Overall Oral Motor/Sensory Function: Appears within functional limits for tasks assessed Motor Speech Overall Motor Speech: Appears within functional limits for tasks assessed   GO   Michael Rainwater MA, CCC-SLP [phone]   Michael Gill 01/20/2014, 2:18 PM

## 2014-01-20 NOTE — Evaluation (Signed)
Clinical/Bedside Swallow Evaluation Patient Details  Name: Michael Gill MRN: [id number] Date of Birth: 09-Jul-1961  Today's Date: 01/20/2014 Time: 6834-1962 SLP Time Calculation (min): 10 min  Past Medical History:  Past Medical History  Diagnosis Date  . Pseudoseizure   . Diabetes mellitus   . Hyperlipidemia   . CVA (cerebral infarction)   . Colon cancer   . Sleep apnea   . B12 deficiency   . CAD (coronary artery disease)     s/p MI   Past Surgical History:  Past Surgical History  Procedure Laterality Date  . Appendectomy    . Tonsillectomy     HPI:  53 y.o. male with prior history of stroke and pseudoseizures presenting with left facial numbness and headache. Status post IV t-PA . Stroke work up underway.Initial head CT negative for acute intracranial abnormality   Assessment / Plan / Recommendation Clinical Impression  Patient presents with a functional oropharyngeal swallow without overt indication of aspiration. No SLP f/u indicated at this time.     Aspiration Risk  Mild    Diet Recommendation Regular;Thin liquid   Liquid Administration via: Cup;Straw Medication Administration: Whole meds with liquid Supervision: Patient able to self feed Compensations: Slow rate;Small sips/bites Postural Changes and/or Swallow Maneuvers: Seated upright 90 degrees    Other  Recommendations Oral Care Recommendations: Oral care BID   Follow Up Recommendations  None    Frequency and Duration        Pertinent Vitals/Pain n/a        Swallow Study    General HPI: 53 y.o. male with prior history of stroke and pseudoseizures presenting with left facial numbness and headache. Status post IV t-PA . Stroke work up underway.Initial head CT negative for acute intracranial abnormality Type of Study: Bedside swallow evaluation Diet Prior to this Study: NPO Temperature Spikes Noted: No Respiratory Status: Room air History of Recent Intubation: No Behavior/Cognition:  Alert;Cooperative;Pleasant mood Oral Cavity - Dentition: Adequate natural dentition Self-Feeding Abilities: Able to feed self Patient Positioning: Upright in bed Baseline Vocal Quality: Clear Volitional Cough: Strong Volitional Swallow: Able to elicit    Oral/Motor/Sensory Function Overall Oral Motor/Sensory Function: Appears within functional limits for tasks assessed   Ice Chips Ice chips: Not tested   Thin Liquid Thin Liquid: Within functional limits Presentation: Cup;Self Fed;Straw    Nectar Thick Nectar Thick Liquid: Not tested   Honey Thick Honey Thick Liquid: Not tested   Puree Puree: Within functional limits Presentation: Self Fed;Spoon   Solid   GO   Leah McCoy MA, CCC-SLP [phone]  Solid: Within functional limits Presentation: [place] 01/20/2014,2:16 PM

## 2014-01-21 DIAGNOSIS — G458 Other transient cerebral ischemic attacks and related syndromes: Secondary | ICD-10-CM

## 2014-01-21 LAB — TROPONIN I: Troponin I: <0.3 ng/mL (ref <0.30)

## 2014-01-21 LAB — GLUCOSE, CAPILLARY
GLUCOSE-CAPILLARY: 143 mg/dL — AB (ref 70–99)
GLUCOSE-CAPILLARY: 191 mg/dL — AB (ref 70–99)

## 2014-01-21 MED ORDER — PANTOPRAZOLE SODIUM 40 MG PO TBEC: 40.0000 mg | DELAYED_RELEASE_TABLET | Freq: Every day | ORAL | Status: DC

## 2014-01-21 MED ORDER — ASPIRIN EC 81 MG PO TBEC
81.0000 mg | DELAYED_RELEASE_TABLET | Freq: Every day | ORAL | Status: DC
  Administered 2014-01-21: 81 mg via ORAL
  Filled 2014-01-21: qty 1

## 2014-01-21 MED ORDER — INSULIN ASPART PROT & ASPART (70-30 MIX) 100 UNIT/ML ~~LOC~~ SUSP
40.0000 [IU] | Freq: Every day | SUBCUTANEOUS | Status: DC
  Filled 2014-01-21: qty 10

## 2014-01-21 MED ORDER — INSULIN LISPRO PROT & LISPRO (75-25 MIX) 100 UNIT/ML KWIKPEN: 40.0000 [IU] | PEN_INJECTOR | Freq: Every day | SUBCUTANEOUS | Status: DC

## 2014-01-21 MED ORDER — ATORVASTATIN CALCIUM 40 MG PO TABS
40.0000 mg | ORAL_TABLET | Freq: Every day | ORAL | Status: DC
  Filled 2014-01-21: qty 1

## 2014-01-21 MED ORDER — LOSARTAN POTASSIUM 25 MG PO TABS
25.0000 mg | ORAL_TABLET | Freq: Every day | ORAL | Status: DC
  Administered 2014-01-21: 25 mg via ORAL
  Filled 2014-01-21: qty 1

## 2014-01-21 MED ORDER — TRAZODONE HCL 50 MG PO TABS
50.0000 mg | ORAL_TABLET | Freq: Every day | ORAL | Status: DC
  Filled 2014-01-21: qty 1

## 2014-01-21 NOTE — Progress Notes (Signed)
PT Cancellation Note  Patient Details Name: Michael Gill MRN: [id number] DOB: 06-Nov-1960   Cancelled Treatment:    Reason Eval/Treat Not Completed: Patient not medically ready (pt still listed as strict bedrest) Will see once activity orders updated.   Duncan Dull 01/21/2014, 7:45 AM Alben Deeds, PT DPT  [phone]

## 2014-01-21 NOTE — Discharge Summary (Signed)
Stroke Discharge Summary  Patient ID: Michael Gill   MRN: [id number]      DOB: 1960-09-07  Date of Admission: 01/19/2014 Date of Discharge: 01/21/2014  Attending Physician:  Lenor Coffin, MD  Consulting Physician(s):     None  Patient's PCP:  PROVIDER NOT IN SYSTEM  Discharge Diagnoses:  Principal Problem:   TIA (transient ischemic attack)  BMI: Body mass index is 32.27 kg/(m^2).  Past Medical History  Diagnosis Date  . Pseudoseizure   . Diabetes mellitus   . Hyperlipidemia   . CVA (cerebral infarction)   . Colon cancer   . Sleep apnea   . B12 deficiency   . CAD (coronary artery disease)     s/p MI   Past Surgical History  Procedure Laterality Date  . Appendectomy    . Tonsillectomy        Medication List         aspirin EC 81 MG tablet  Take 81 mg by mouth daily.     atorvastatin 40 MG tablet  Commonly known as:  LIPITOR  Take 40 mg by mouth daily.     HUMALOG MIX 75/25 KWIKPEN (75-25) 100 UNIT/ML Kwikpen  Generic drug:  Insulin Lispro Prot & Lispro  Inject 40 Units into the skin daily.     losartan 25 MG tablet  Commonly known as:  COZAAR  Take 25 mg by mouth daily.     traZODone 50 MG tablet  Commonly known as:  DESYREL  Take 50-100 mg by mouth at bedtime.        LABORATORY STUDIES CBC No results found for this basename: wbc, rbc, hgb, hct, plt, mcv, mch, mchc, rdw, neutrabs, lymphsabs, monoabs, eosabs, basosabs   CMP No results found for this basename: na, k, cl, co2, glucose, bun, creatinine, calcium, prot, albumin, ast, alt, alkphos, bilitot, gfrnonaa, gfraa   COAGS No results found for this basename: INR, PROTIME   Lipid Panel    Component Value Date/Time   CHOL 107 01/20/2014 0115   TRIG 97 01/20/2014 0115   HDL 38* 01/20/2014 0115   CHOLHDL 2.8 01/20/2014 0115   VLDL 19 01/20/2014 0115   LDLCALC 50 01/20/2014 0115   HgbA1C  Lab Results  Component Value Date   HGBA1C 10.4* 01/20/2014   Cardiac Panel (last 3 results)   Recent Labs  01/20/14 1828 01/21/14 0028 01/21/14 0702  TROPONINI <0.30 <0.30 <0.30   Urinalysis No results found for this basename: colorurine, appearanceur, labspec, phurine, glucoseu, hgbur, bilirubinur, ketonesur, proteinur, urobilinogen, nitrite, leukocytesur   Urine Drug Screen  No results found for this basename: labopia, cocainscrnur, labbenz, amphetmu, thcu, labbarb    Alcohol Level No results found for this basename: eth     SIGNIFICANT DIAGNOSTIC STUDIES CT of the brain  IMPRESSION:  1. No acute intracranial abnormality or interval change from  yesterday's head CT at 1753 hr.  2. Negative for hemorrhage following administration of  thrombolytics.  MRI of the brain  IMPRESSION:  1. No evidence of acute intracranial abnormality.  2. Unremarkable head MRA.  MRA of the brain  See above  2D Echocardiogram  Study Conclusions  - Left ventricle: The cavity size was normal. Wall thickness was increased in a pattern of mild LVH. Systolic function was normal. The estimated ejection fraction was in the range of 55% to 60%. Wall motion was normal; there were no regional wall motion abnormalities. - Atrial septum: A patent foramen ovale cannot be excluded.  Carotid Doppler  Carotid duplex completed.  Preliminary report: Bilateral: 1-39% ICA stenosis. Vertebral artery flow is antegrade.  CXR  IMPRESSION:  No active disease.  EKG  Normal sinus rhythm  Normal ECG  No old tracing to compare    History of Present Illness :  Chief Complaint: Unable to speak  HPI: Michael Gill is an 53 y.o. male with a history of HTN, HLD, DM, prior stroke and pseudoseizures who presented 01/19/14 with complaints of headache and numbness on the left side of his face which is his usual presentation for his pseudoseizures. He then usually has weakness on the right side and starts to jerk. Today instead of the jerking he became unable to speak. It seems gait was affected as well. Patient was  evaluated at [facility] ED and IV tPA was administered. Patient now transferred to [facility] for further care. Modified NIHSS of 10.  The patient has a history of stroke three years ago with right sided weakness and effects on cognitive function. went to the doctor's office and was able to ambulate with a walker and was able to talk  Date last known well: Date: 01/19/2014  Time last known well: Time: 15:45  tPA Given: Yes   Hospital Course Patient presented 01/19/14 to the ED at [facility] with headache, left facial numbness, and speech difficulty. He was given IV tPA and transferred to [facility] for further management. He was admitted to the neuro ICU, and his symptoms completely resolved by the following morning. TIA workup complete. MRI revealed no acute abnormality and MRA was unremarkable. Transthoracic Echocardiogram did not show a definite source of embolus. Carotid Dopplers showed no significant stenosis. EKG showed Normal sinus rhythm. HgbA1C was 10.4, LDL was 50.   Patient with vascular risk factors of:   Hypertension  Hyperlipidema  Diabetes Mellitus  History of prior stroke  Patient's symptoms now completely resolved. Physical therapy evaluated patient and found no needs for continued treatment after discharge.   Discharge Exam  Blood pressure 125/76, pulse 98, temperature 97.9 F (36.6 C), temperature source Oral, resp. rate 16, height 5\' 10"  (1.778 m), weight 102 kg (224 lb 13.9 oz), SpO2 96.00%.   Physical Exam   General: The patient is alert and cooperative at the time of the examination. The patient is moderately to markedly obese.  Respiratory: Lung fields are clear  Cardiovascular: Regular rate and rhythm, no murmurs or rubs  Abdomen: Obese, nontender, positive bowel sounds  Skin: No significant peripheral edema is noted.   Neurologic Exam   Mental status: The patient is oriented x 3.  Cranial nerves: Facial symmetry is present. Speech is normal, no  aphasia or dysarthria is noted. Extraocular movements are full. Visual fields are full.  Motor: The patient has good strength in all 4 extremities.  Sensory examination: Soft touch sensation is symmetric on the face, arms, and legs.  Coordination: The patient has good finger-nose-finger and heel-to-shin bilaterally.  Gait and station: The gait was not tested. No drift is seen with the arms.  Reflexes: Deep tendon reflexes are symmetric.   Discharge Diet   Carb Control thin liquids  Discharge Plan    Disposition:  Home  aspirin 81 mg orally every day for secondary stroke prevention.  Ongoing risk factor control by Primary Care Physician.  Risk factor recommendations:    Hypertension: Continue current medication regimen   Hyperlipidemia: LDL is 50, meeting goal of LDL<70 due to history of stroke and diabetes.  Diabetes: HgbA1C was 10.4, indicating that diabetes is not well controlled. POC blood glucose results 101-179 while hospitalized. Please continue to advise patient about the importance of diet, exercise, and medication adherence for diabetes management.   Follow-up with PCP in 2 weeks.  Follow-up with Dr. Erlinda Hong at [facility] as needed  30 minutes were spent preparing discharge.  Signed Delbert Phenix, MSN, ANP-C, CNRN, MSCS [facility] Stroke Team [phone]  I have personally examined this patient, reviewed pertinent data and developed the plan of care. I agree with above.

## 2014-01-21 NOTE — Progress Notes (Signed)
Stroke Team Progress Note  HISTORY Chief Complaint: Unable to speak  HPI: Michael Gill is an 53 y.o. male with a history of stroke and pseudoseizures who was not feeling well today. He had complaints of ha headache and numbness on the left side of his face which is his usual presentation for his pseudoseizures. He then usually has weakness on the right side and starts to jerk. Today instead of the jerking he became unable to speak. It seems gait was affected as well. Patient was evaluated at [facility] ED and IV tPA was administered. Patient now transferred to [facility] for further care. Modified NIHSS of 10.  The patient has a history of stroke three years ago with right sided weakness and effects on cognitive function. went to the doctor's office and was able to ambulate with a walker and was able to talk  Date last known well: Date: 01/19/2014  Time last known well: Time: 15:45  tPA Given: Yes    He was admitted to the neuro ICU for further evaluation and treatment.  SUBJECTIVE The patient is alert and cooperative, the wife at bedside. The patient feels well this morning.  OBJECTIVE Most recent Vital Signs: Filed Vitals:   01/21/14 0500 01/21/14 0600 01/21/14 0700 01/21/14 0748  BP: 111/76 120/73 111/74   Pulse: 69 67 67   Temp:    97.9 F (36.6 C)  TempSrc:    Oral  Resp: 12 14 12    Height:      Weight:      SpO2: 97% 98% 98%    CBG (last 3)   Recent Labs  01/20/14 1541 01/20/14 1701 01/20/14 2149  GLUCAP 179* 165* 179*    IV Fluid Intake:   . sodium chloride 75 mL/hr at 01/21/14 0300    MEDICATIONS  . insulin aspart  0-15 Units Subcutaneous TID WC  . pantoprazole (PROTONIX) IV  40 mg Intravenous QHS   PRN:  acetaminophen, acetaminophen, labetalol, senna-docusate  Diet:  Carb Control  liquids Activity:  Up as tolerated DVT Prophylaxis: SCD  CLINICALLY SIGNIFICANT STUDIES Basic Metabolic Panel: No results found for this basename: NA, K, CL, CO2, GLUCOSE, BUN,  CREATININE, CALCIUM, MG, PHOS,  in the last 168 hours Liver Function Tests: No results found for this basename: AST, ALT, ALKPHOS, BILITOT, PROT, ALBUMIN,  in the last 168 hours CBC: No results found for this basename: WBC, NEUTROABS, HGB, HCT, MCV, PLT,  in the last 168 hours Coagulation: No results found for this basename: LABPROT, INR,  in the last 168 hours Cardiac Enzymes:   Recent Labs Lab 01/20/14 1208 01/20/14 1828 01/21/14 0028  TROPONINI <0.30 <0.30 <0.30   Urinalysis: No results found for this basename: COLORURINE, APPERANCEUR, LABSPEC, PHURINE, GLUCOSEU, HGBUR, BILIRUBINUR, KETONESUR, PROTEINUR, UROBILINOGEN, NITRITE, LEUKOCYTESUR,  in the last 168 hours Lipid Panel    Component Value Date/Time   CHOL 107 01/20/2014 0115   TRIG 97 01/20/2014 0115   HDL 38* 01/20/2014 0115   CHOLHDL 2.8 01/20/2014 0115   VLDL 19 01/20/2014 0115   LDLCALC 50 01/20/2014 0115   HgbA1C  Lab Results  Component Value Date   HGBA1C 10.4* 01/20/2014    Urine Drug Screen:   No results found for this basename: labopia,  cocainscrnur,  labbenz,  amphetmu,  thcu,  labbarb    Alcohol Level: No results found for this basename: ETH,  in the last 168 hours  Ct Head Wo Contrast  01/20/2014   CLINICAL DATA:  Stroke. Patient  received tPA. Residual left-sided weakness.  EXAM: CT HEAD WITHOUT CONTRAST  TECHNIQUE: Contiguous axial images were obtained from the base of the skull through the vertex without intravenous contrast.  COMPARISON:  01/19/2014.  FINDINGS: Posterior fossa structures appear normal. No hyperdense MCA sign. Insular ribbon appears within normal limits. Gray-white differentiation in the basal ganglia within normal limits bilaterally. No mass lesion, mass effect, midline shift, hydrocephalus, or hemorrhage. No evidence of an acute or subacute infarct. Mastoid air cells clear. Other paranasal sinuses are within normal limits. 13 mm x 12 mm partially visualized left parotid gland nodule again noted.   No interval change from CT head 01/19/2014.  IMPRESSION: 1. No acute intracranial abnormality or interval change from yesterday's head CT at 1753 hr. 2. Negative for hemorrhage following administration of thrombolytics.   Electronically Signed   By: Dereck Ligas M.D.   On: 01/20/2014 07:21   Dg Chest Port 1 View  01/20/2014   CLINICAL DATA:  Stroke.  EXAM: PORTABLE CHEST - 1 VIEW  COMPARISON:  11/26/2013  FINDINGS: Shallow inspiration. The heart size and mediastinal contours are within normal limits. Both lungs are clear. The visualized skeletal structures are unremarkable.  IMPRESSION: No active disease.   Electronically Signed   By: Lucienne Capers M.D.   On: 01/20/2014 02:03    CT of the brain   IMPRESSION:  1. No acute intracranial abnormality or interval change from  yesterday's head CT at 1753 hr.  2. Negative for hemorrhage following administration of  thrombolytics.  MRI of the brain   IMPRESSION:  1. No evidence of acute intracranial abnormality.  2. Unremarkable head MRA.  MRA of the brain   See above 2D Echocardiogram   Study Conclusions  - Left ventricle: The cavity size was normal. Wall thickness was increased in a pattern of mild LVH. Systolic function was normal. The estimated ejection fraction was in the range of 55% to 60%. Wall motion was normal; there were no regional wall motion abnormalities. - Atrial septum: A patent foramen ovale cannot be excluded.   Carotid Doppler   Carotid duplex completed.  Preliminary report: Bilateral: 1-39% ICA stenosis. Vertebral artery flow is antegrade.   CXR   IMPRESSION:  No active disease.  EKG    Normal sinus rhythm Normal ECG No old tracing to compare Therapy Recommendations pending   Physical Exam  General: The patient is alert and cooperative at the time of the examination. The patient is moderately to markedly obese.  Respiratory: Lung fields are clear  Cardiovascular: Regular rate and rhythm, no  murmurs or rubs  Abdomen: Obese, nontender, positive bowel sounds  Skin: No significant peripheral edema is noted.   Neurologic Exam  Mental status: The patient is oriented x 3.  Cranial nerves: Facial symmetry is present. Speech is normal, no aphasia or dysarthria is noted. Extraocular movements are full. Visual fields are full.  Motor: The patient has good strength in all 4 extremities.  Sensory examination: Soft touch sensation is symmetric on the face, arms, and legs.  Coordination: The patient has good finger-nose-finger and heel-to-shin bilaterally.  Gait and station: The gait was not tested. No drift is seen with the arms.  Reflexes: Deep tendon reflexes are symmetric.     ASSESSMENT Michael Gill is a 53 y.o. male with prior history of stroke and pseudoseizures presenting with left facial numbness and headache. Status post IV t-PA . Stroke work up underway.   Patient in Neuro ICU on post-tPA care  pathway  LDL 50  HgbA1C pending  Diabetes  Obesity  History of pseudoseizures  History of prior stroke, left-sided deficit  Hypertension  Dyslipidemia, on atorvastatin   Hospital day # 2  Currently, the patient has essentially no deficits. The patient has a history pseudoseizures in the past, but he also has risk factors for stroke. Stroke workup was negative, the patient either suffered a TIA or had a psychogenic stroke event.  TREATMENT/PLAN  Aspirin therapy   Mobilize patient, if the patient does well this morning, will discharge later today.  Physical and occupational therapy   Restart atorvastatin  SIGNED  Lenor Coffin  I have personally obtained a history, examined the patient, evaluated imaging results, and formulated the assessment and plan of care. I agree with the above.    To contact Stroke Continuity provider, please refer to [email or web address] After hours, contact General Neurology

## 2014-01-21 NOTE — Discharge Instructions (Signed)
Please follow up with your Primary Care provider in the next two weeks.   Please call [phone] or go to the nearest ER if you experience any signs or symptoms suggesting stroke, including:  Visual changes, severe headache, difficulty with speech or thinking, numbness or tingling, weakness, coordination difficulty, or difficulty with walking.

## 2014-01-21 NOTE — Progress Notes (Signed)
OT Cancellation Note  Patient Details Name: Michael Gill MRN: [id number] DOB: [birth year]   Cancelled Treatment:    Reason Eval/Treat Not Completed: OT screened, no needs identified, will sign off  [facility], OTR/L  [phone] 01/21/2014 01/21/2014, 1:41 PM

## 2014-01-21 NOTE — Evaluation (Signed)
Physical Therapy Evaluation Patient Details Name: Michael Gill MRN: [id number] DOB: 1960-08-08 Today's Date: 01/21/2014   History of Present Illness  Patient presented 01/19/14 to the ED at [facility] with headache, left facial numbness, and speech difficulty. He was given IV tPA and transferred to [facility] for further management. He was admitted to the neuro ICU, and his symptoms completely resolved by the following morning. TIA workup complete. MRI revealed no acute abnormality and MRA was unremarkable.  Clinical Impression  Patient independent with mobility, addressed home questions and increasing activity upon discharge. No further acute PT needs, will sign off.     Follow Up Recommendations No PT follow up    Equipment Recommendations  None recommended by PT    Recommendations for Other Services       Precautions / Restrictions Restrictions Weight Bearing Restrictions: No      Mobility  Bed Mobility Overal bed mobility: Independent                Transfers Overall transfer level: Independent                  Ambulation/Gait Ambulation/Gait assistance: Independent Ambulation Distance (Feet): 640 Feet Assistive device: None   Gait velocity: WFL   General Gait Details: steady with gait  Stairs Stairs: Yes Stairs assistance: Supervision Stair Management: One rail Right;Step to pattern Number of Stairs: 4 General stair comments: increased time to perform  Wheelchair Mobility    Modified Rankin (Stroke Patients Only) Modified Rankin (Stroke Patients Only) Pre-Morbid Rankin Score: No symptoms Modified Rankin: No significant disability     Balance Overall balance assessment: Modified Independent                               Standardized Balance Assessment Standardized Balance Assessment : Dynamic Gait Index   Dynamic Gait Index Level Surface: Normal Change in Gait Speed: Normal Gait with Horizontal Head Turns:  Normal Gait with Vertical Head Turns: Normal Gait and Pivot Turn: Mild Impairment Step Over Obstacle: Mild Impairment Step Around Obstacles: Normal Steps: Moderate Impairment Total Score: 20       Pertinent Vitals/Pain No pain, VSS    Home Living Family/patient expects to be discharged to:: Private residence Living Arrangements: Spouse/significant other Available Help at Discharge: Family Type of Home: House Home Access: Ramped entrance     Home Layout: One level Home Equipment: Environmental consultant - 2 wheels      Prior Function Level of Independence: Independent               Hand Dominance   Dominant Hand: Right    Extremity/Trunk Assessment   Upper Extremity Assessment: Overall WFL for tasks assessed           Lower Extremity Assessment: Overall WFL for tasks assessed         Communication   Communication: No difficulties  Cognition Arousal/Alertness: Awake/alert Behavior During Therapy: WFL for tasks assessed/performed                        General Comments General comments (skin integrity, edema, etc.): educated patient and family regarding mobility expectations, addressed all questions related to discharge with mobility. Instructed patient to gradually increase activity. No further concerns, will sign off, patient in agreement.    Exercises        Assessment/Plan    PT Assessment Patent does not need any further PT  services  PT Diagnosis     PT Problem List    PT Treatment Interventions     PT Goals (Current goals can be found in the Care Plan section) Acute Rehab PT Goals Patient Stated Goal: to go home PT Goal Formulation: No goals set, d/c therapy    Frequency     Barriers to discharge        Co-evaluation               End of Session Equipment Utilized During Treatment: Gait belt Activity Tolerance: Patient tolerated treatment well Patient left: in bed;with call bell/phone within reach;with family/visitor  present Nurse Communication: Mobility status         Time: 1310-1336 PT Time Calculation (min): 26 min   Charges:   PT Evaluation $Initial PT Evaluation Tier I: 1 Procedure PT Treatments $Gait Training: 8-22 mins $Self Care/Home Management: 8-22   PT G CodesDuncan Dull 01/21/2014, 3:57 PM Alben Deeds, [place] DPT  [phone]

## 2014-01-21 NOTE — Progress Notes (Signed)
OT Cancellation Note  Patient Details Name: Michael Gill MRN: [id number] DOB: Mar 01, 1961   Cancelled Treatment:    Reason Eval/Treat Not Completed: Other (comment) Pt remains on bedrest. Please notify when pt is appropriate to begin therapy. Thanks. [place], OTR/L  [phone] 01/21/2014 01/21/2014, 8:54 AM

## 2014-05-11 ENCOUNTER — Observation Stay: Payer: Self-pay | Admitting: Internal Medicine

## 2014-05-11 LAB — URINALYSIS, COMPLETE
BILIRUBIN, UR: NEGATIVE
Bacteria: NONE SEEN
Blood: NEGATIVE
Glucose,UR: NEGATIVE mg/dL (ref 0–75)
Ketone: NEGATIVE
Leukocyte Esterase: NEGATIVE
Nitrite: NEGATIVE
Ph: 6 (ref 4.5–8.0)
Protein: NEGATIVE
RBC,UR: <1 /HPF (ref 0–5)
Specific Gravity: 1.014 (ref 1.003–1.030)
Squamous Epithelial: NONE SEEN
WBC UR: 1 /HPF (ref 0–5)

## 2014-05-11 LAB — COMPREHENSIVE METABOLIC PANEL
ALBUMIN: 3.2 g/dL — AB (ref 3.4–5.0)
ALT: 28 U/L
ANION GAP: 8 (ref 7–16)
AST: 29 U/L (ref 15–37)
Alkaline Phosphatase: 62 U/L
BUN: 12 mg/dL (ref 7–18)
Bilirubin,Total: 0.6 mg/dL (ref 0.2–1.0)
CHLORIDE: 110 mmol/L — AB (ref 98–107)
CREATININE: 0.88 mg/dL (ref 0.60–1.30)
Calcium, Total: 8.1 mg/dL — ABNORMAL LOW (ref 8.5–10.1)
Co2: 24 mmol/L (ref 21–32)
EGFR (African American): >60
EGFR (Non-African Amer.): >60
GLUCOSE: 122 mg/dL — AB (ref 65–99)
Osmolality: 284 (ref 275–301)
POTASSIUM: 3.9 mmol/L (ref 3.5–5.1)
Sodium: 142 mmol/L (ref 136–145)
Total Protein: 6.2 g/dL — ABNORMAL LOW (ref 6.4–8.2)

## 2014-05-11 LAB — CBC WITH DIFFERENTIAL/PLATELET
BASOS PCT: 0.5 %
Basophil #: 0 10*3/uL (ref 0.0–0.1)
Eosinophil #: 0.2 10*3/uL (ref 0.0–0.7)
Eosinophil %: 2.4 %
HCT: 45.2 % (ref 40.0–52.0)
HGB: 14.7 g/dL (ref 13.0–18.0)
LYMPHS ABS: 1.3 10*3/uL (ref 1.0–3.6)
LYMPHS PCT: 17.6 %
MCH: 30.6 pg (ref 26.0–34.0)
MCHC: 32.5 g/dL (ref 32.0–36.0)
MCV: 94 fL (ref 80–100)
Monocyte #: 0.7 x10 3/mm (ref 0.2–1.0)
Monocyte %: 9 %
Neutrophil #: 5.3 10*3/uL (ref 1.4–6.5)
Neutrophil %: 70.5 %
PLATELETS: 208 10*3/uL (ref 150–440)
RBC: 4.79 10*6/uL (ref 4.40–5.90)
RDW: 12.5 % (ref 11.5–14.5)
WBC: 7.6 10*3/uL (ref 3.8–10.6)

## 2014-05-11 LAB — PROTIME-INR
INR: 1
Prothrombin Time: 12.8 secs (ref 11.5–14.7)

## 2014-05-11 LAB — TROPONIN I: Troponin-I: <0.02 ng/mL

## 2014-05-11 LAB — APTT: ACTIVATED PTT: 26.4 s (ref 23.6–35.9)

## 2014-05-12 LAB — LIPID PANEL
CHOLESTEROL: 91 mg/dL (ref 0–200)
HDL: 33 mg/dL — AB (ref 40–60)
LDL CHOLESTEROL, CALC: 38 mg/dL (ref 0–100)
Triglycerides: 102 mg/dL (ref 0–200)
VLDL CHOLESTEROL, CALC: 20 mg/dL (ref 5–40)

## 2014-07-11 ENCOUNTER — Emergency Department: Payer: Self-pay | Admitting: Emergency Medicine

## 2014-07-11 LAB — URINALYSIS, COMPLETE
BLOOD: NEGATIVE
Bacteria: NONE SEEN
Bilirubin,UR: NEGATIVE
Glucose,UR: 150 mg/dL (ref 0–75)
KETONE: NEGATIVE
Leukocyte Esterase: NEGATIVE
NITRITE: NEGATIVE
Ph: 5 (ref 4.5–8.0)
Protein: NEGATIVE
RBC,UR: NONE SEEN /HPF (ref 0–5)
SPECIFIC GRAVITY: 1.018 (ref 1.003–1.030)
Squamous Epithelial: NONE SEEN

## 2014-07-11 LAB — CBC
HCT: 46 % (ref 40.0–52.0)
HGB: 15.3 g/dL (ref 13.0–18.0)
MCH: 31.9 pg (ref 26.0–34.0)
MCHC: 33.3 g/dL (ref 32.0–36.0)
MCV: 96 fL (ref 80–100)
Platelet: 178 10*3/uL (ref 150–440)
RBC: 4.8 10*6/uL (ref 4.40–5.90)
RDW: 13.2 % (ref 11.5–14.5)
WBC: 6.2 10*3/uL (ref 3.8–10.6)

## 2014-07-11 LAB — COMPREHENSIVE METABOLIC PANEL
ANION GAP: 8 (ref 7–16)
AST: 26 U/L (ref 15–37)
Albumin: 3.2 g/dL — ABNORMAL LOW (ref 3.4–5.0)
Alkaline Phosphatase: 60 U/L
BUN: 20 mg/dL — ABNORMAL HIGH (ref 7–18)
Bilirubin,Total: 0.5 mg/dL (ref 0.2–1.0)
CALCIUM: 8.4 mg/dL — AB (ref 8.5–10.1)
CHLORIDE: 108 mmol/L — AB (ref 98–107)
CO2: 25 mmol/L (ref 21–32)
CREATININE: 1.26 mg/dL (ref 0.60–1.30)
EGFR (African American): >60
EGFR (Non-African Amer.): >60
Glucose: 166 mg/dL — ABNORMAL HIGH (ref 65–99)
OSMOLALITY: 288 (ref 275–301)
Potassium: 4 mmol/L (ref 3.5–5.1)
SGPT (ALT): 30 U/L
Sodium: 141 mmol/L (ref 136–145)
TOTAL PROTEIN: 6.5 g/dL (ref 6.4–8.2)

## 2014-07-11 LAB — TROPONIN I: Troponin-I: <0.02 ng/mL

## 2014-07-31 DIAGNOSIS — I82409 Acute embolism and thrombosis of unspecified deep veins of unspecified lower extremity: Secondary | ICD-10-CM

## 2014-07-31 HISTORY — DX: Acute embolism and thrombosis of unspecified deep veins of unspecified lower extremity: I82.409

## 2014-09-16 ENCOUNTER — Emergency Department: Payer: Self-pay | Admitting: Emergency Medicine

## 2014-09-29 ENCOUNTER — Emergency Department: Payer: Self-pay | Admitting: Internal Medicine

## 2014-10-22 ENCOUNTER — Emergency Department: Payer: Self-pay | Admitting: Emergency Medicine

## 2014-10-22 LAB — BASIC METABOLIC PANEL
Anion Gap: 9 (ref 7–16)
BUN: 11 mg/dL
CALCIUM: 8.9 mg/dL
CO2: 25 mmol/L
Chloride: 103 mmol/L
Creatinine: 1.02 mg/dL
EGFR (Non-African Amer.): >60
Glucose: 95 mg/dL
POTASSIUM: 4 mmol/L
Sodium: 137 mmol/L

## 2014-10-22 LAB — CBC
HCT: 45.5 % (ref 40.0–52.0)
HGB: 15.3 g/dL (ref 13.0–18.0)
MCH: 31.4 pg (ref 26.0–34.0)
MCHC: 33.7 g/dL (ref 32.0–36.0)
MCV: 93 fL (ref 80–100)
Platelet: 272 10*3/uL (ref 150–440)
RBC: 4.88 10*6/uL (ref 4.40–5.90)
RDW: 13.3 % (ref 11.5–14.5)
WBC: 6.8 10*3/uL (ref 3.8–10.6)

## 2014-10-22 LAB — TROPONIN I: Troponin-I: <0.03 ng/mL

## 2014-10-30 DIAGNOSIS — I82431 Acute embolism and thrombosis of right popliteal vein: Secondary | ICD-10-CM | POA: Insufficient documentation

## 2014-11-01 ENCOUNTER — Emergency Department
Admit: 2014-11-01 | Disposition: A | Discharge status: Other Acute Inpatient Hospital | Payer: Self-pay | Admitting: Internal Medicine

## 2014-11-01 ENCOUNTER — Ambulatory Visit (HOSPITAL_COMMUNITY)
Admission: EM | Admit: 2014-11-01 | Discharge: 2014-11-01 | Disposition: A | Discharge status: Home / Self Care | Payer: Medicare Other | Source: Other Acute Inpatient Hospital | Attending: Internal Medicine | Admitting: Internal Medicine

## 2014-11-01 DIAGNOSIS — A419 Sepsis, unspecified organism: Secondary | ICD-10-CM | POA: Insufficient documentation

## 2014-11-01 LAB — COMPREHENSIVE METABOLIC PANEL
ALBUMIN: 2.2 g/dL — AB
ALT: 27 U/L
ANION GAP: 12 (ref 7–16)
Alkaline Phosphatase: 97 U/L
BILIRUBIN TOTAL: 2.1 mg/dL — AB
BUN: 36 mg/dL — AB
CHLORIDE: 88 mmol/L — AB
Calcium, Total: 7.9 mg/dL — ABNORMAL LOW
Co2: 25 mmol/L
Creatinine: 1.92 mg/dL — ABNORMAL HIGH
EGFR (African American): 44 — ABNORMAL LOW
EGFR (Non-African Amer.): 38 — ABNORMAL LOW
Glucose: 286 mg/dL — ABNORMAL HIGH
POTASSIUM: 3.5 mmol/L
SGOT(AST): 20 U/L
Sodium: 125 mmol/L — ABNORMAL LOW
Total Protein: 5.9 g/dL — ABNORMAL LOW

## 2014-11-01 LAB — CBC WITH DIFFERENTIAL/PLATELET
Basophil #: 0 10*3/uL (ref 0.0–0.1)
Basophil %: 0.3 %
EOS PCT: 1.1 %
Eosinophil #: 0.1 10*3/uL (ref 0.0–0.7)
HCT: 33.3 % — ABNORMAL LOW (ref 40.0–52.0)
HGB: 11.3 g/dL — ABNORMAL LOW (ref 13.0–18.0)
Lymphocyte #: 0.4 10*3/uL — ABNORMAL LOW (ref 1.0–3.6)
Lymphocyte %: 4 %
MCH: 30.6 pg (ref 26.0–34.0)
MCHC: 33.9 g/dL (ref 32.0–36.0)
MCV: 90 fL (ref 80–100)
MONO ABS: 0.4 x10 3/mm (ref 0.2–1.0)
MONOS PCT: 4.2 %
NEUTROS PCT: 90.4 %
Neutrophil #: 8.5 10*3/uL — ABNORMAL HIGH (ref 1.4–6.5)
Platelet: 79 10*3/uL — ABNORMAL LOW (ref 150–440)
RBC: 3.68 10*6/uL — ABNORMAL LOW (ref 4.40–5.90)
RDW: 13.5 % (ref 11.5–14.5)
WBC: 9.4 10*3/uL (ref 3.8–10.6)

## 2014-11-01 LAB — URINALYSIS, COMPLETE
BILIRUBIN, UR: NEGATIVE
Ketone: NEGATIVE
LEUKOCYTE ESTERASE: NEGATIVE
Nitrite: NEGATIVE
PH: 5 (ref 4.5–8.0)
Protein: 100
RBC,UR: 6 /HPF (ref 0–5)
Specific Gravity: 1.012 (ref 1.003–1.030)
Squamous Epithelial: <1
WBC UR: 16 /HPF (ref 0–5)

## 2014-11-01 LAB — LACTIC ACID, PLASMA: Lactic Acid, Venous: 1.8 mmol/L

## 2014-11-01 LAB — PHOSPHORUS: PHOSPHORUS: 3 mg/dL

## 2014-11-01 LAB — TROPONIN I: Troponin-I: 0.04 ng/mL — ABNORMAL HIGH

## 2014-11-01 LAB — PROTIME-INR
INR: 1.2
PROTHROMBIN TIME: 14.9 s

## 2014-11-01 LAB — MAGNESIUM: MAGNESIUM: 1.9 mg/dL

## 2014-11-02 LAB — URINE CULTURE

## 2014-11-06 LAB — CULTURE, BLOOD (SINGLE)

## 2014-11-13 ENCOUNTER — Emergency Department
Admit: 2014-11-13 | Disposition: A | Discharge status: Home / Self Care | Payer: Self-pay | Admitting: Emergency Medicine

## 2014-11-13 LAB — CBC WITH DIFFERENTIAL/PLATELET
BASOS PCT: 0.7 %
Basophil #: 0.1 10*3/uL (ref 0.0–0.1)
EOS ABS: 0.8 10*3/uL — AB (ref 0.0–0.7)
Eosinophil %: 10.2 %
HCT: 32.2 % — ABNORMAL LOW (ref 40.0–52.0)
HGB: 10.7 g/dL — ABNORMAL LOW (ref 13.0–18.0)
Lymphocyte #: 1.3 10*3/uL (ref 1.0–3.6)
Lymphocyte %: 16.8 %
MCH: 30.7 pg (ref 26.0–34.0)
MCHC: 33.4 g/dL (ref 32.0–36.0)
MCV: 92 fL (ref 80–100)
Monocyte #: 0.4 x10 3/mm (ref 0.2–1.0)
Monocyte %: 5.2 %
NEUTROS PCT: 67.1 %
Neutrophil #: 5.1 10*3/uL (ref 1.4–6.5)
Platelet: 376 10*3/uL (ref 150–440)
RBC: 3.5 10*6/uL — AB (ref 4.40–5.90)
RDW: 15.8 % — AB (ref 11.5–14.5)
WBC: 7.6 10*3/uL (ref 3.8–10.6)

## 2014-11-13 LAB — BASIC METABOLIC PANEL
Anion Gap: 6 — ABNORMAL LOW (ref 7–16)
BUN: 7 mg/dL
CHLORIDE: 107 mmol/L
Calcium, Total: 8.5 mg/dL — ABNORMAL LOW
Co2: 26 mmol/L
Creatinine: 1 mg/dL
EGFR (African American): >60
EGFR (Non-African Amer.): >60
Glucose: 249 mg/dL — ABNORMAL HIGH
Potassium: 3.9 mmol/L
Sodium: 139 mmol/L

## 2014-11-13 LAB — PROTIME-INR
INR: 1
Prothrombin Time: 13.2 secs

## 2014-11-13 LAB — D-DIMER(ARMC): D-DIMER: 1740 ng/mL

## 2014-11-20 ENCOUNTER — Emergency Department
Admit: 2014-11-20 | Disposition: A | Discharge status: Home / Self Care | Payer: Self-pay | Admitting: Emergency Medicine

## 2014-11-20 LAB — CBC
HCT: 32.3 % — ABNORMAL LOW (ref 40.0–52.0)
HGB: 10.7 g/dL — ABNORMAL LOW (ref 13.0–18.0)
MCH: 30.4 pg (ref 26.0–34.0)
MCHC: 33.2 g/dL (ref 32.0–36.0)
MCV: 92 fL (ref 80–100)
Platelet: 248 10*3/uL (ref 150–440)
RBC: 3.52 10*6/uL — ABNORMAL LOW (ref 4.40–5.90)
RDW: 15.9 % — ABNORMAL HIGH (ref 11.5–14.5)
WBC: 6.4 10*3/uL (ref 3.8–10.6)

## 2014-11-20 LAB — BASIC METABOLIC PANEL
Anion Gap: 6 — ABNORMAL LOW (ref 7–16)
BUN: 9 mg/dL
CALCIUM: 8.6 mg/dL — AB
Chloride: 100 mmol/L — ABNORMAL LOW
Co2: 28 mmol/L
Creatinine: 1.08 mg/dL
Glucose: 184 mg/dL — ABNORMAL HIGH
POTASSIUM: 3.9 mmol/L
SODIUM: 134 mmol/L — AB

## 2014-11-20 LAB — PRO B NATRIURETIC PEPTIDE: B-Type Natriuretic Peptide: 30 pg/mL

## 2014-11-20 LAB — TROPONIN I: Troponin-I: <0.03 ng/mL

## 2014-11-21 NOTE — H&P (Signed)
PATIENT NAME:  Michael Gill, Michael Gill MR#:  [id number] DATE OF BIRTH:  1960-12-22  DATE OF ADMISSION:  05/11/2014  REFERRING PHYSICIAN:  Ahmed Prima, MD     PRIMARY CARE PHYSICIAN:  Marden Noble, MD   CHIEF COMPLAINT: Left-sided weakness.   HISTORY OF PRESENT ILLNESS:  A 54 year old Caucasian gentleman with history of type 2 diabetes complicated by neuropathy, which is insulin requiring; hyperlipidemia, history of CVA with residual minimal left-sided weakness, presenting with worsened left-sided weakness. According to the wife who is present at bedside states that he has acute onset of left-sided weakness about 2 to 3 hours prior to arrival to the Emergency Department with the inability to talk. His symptoms have been improving steadily but still present. He is now able to talk and is complaining of headache frontal in location, 7 out of 10, in intensity, throbbing in quality, no worsening or relieving factors. No radiation.   REVIEW OF SYSTEMS:  CONSTITUTIONAL: Denies fevers, chills, fatigue; positive for weakness as described above.  EYES: Denies blurred vision, double vision or eye pain.  HEENT: Denies tenderness, ear pain, hearing loss.  RESPIRATORY: Denies cough, wheeze, shortness of breath.  CARDIOVASCULAR: Denies chest pain, palpitations, edema.  GASTROINTESTINAL: Denies nausea, vomiting, diarrhea, or abdominal pain.  GENITOURINARY: Denies dysuria, hematuria.  ENDOCRINE:  Denies nocturia or thyroid problems.  HEMATOLOGIC:  He denies easy bruising, bleeding.  SKIN: Denies rash or lesion.  MUSCULOSKELETAL: Denies pain in neck, back, shoulder, knees, hips or arthritic symptoms.  NEUROLOGIC: Positive for headache as described above as well as left-sided weakness.  PSYCHIATRIC: Denies anxiety or depressive symptoms; otherwise, full review of systems performed by me is negative.   PAST MEDICAL HISTORY: Type 2 diabetes, insulin-requiring; complicated by peripheral neuropathy; hyperlipidemia,  history of CVA with residual minimal left-sided weakness requiring walker for ambulation as well as history of "pseudoseizures."   SOCIAL HISTORY: No alcohol, tobacco, or drug usage.   FAMILY HISTORY: No cardiovascular or pulmonary disorders.   ALLERGIES: CODEINE, SULFA DRUGS.   HOME MEDICATIONS: Aspirin 81 mg p.o. daily, Lyrica 75 mg p.o. b.i.d., 70/30 NovoLog 40 units in the morning, Oseni 25/15 mg p.o. daily, atorvastatin 40 mg p.o. at bedtime.   PHYSICAL EXAMINATION: VITAL SIGNS: Temperature 98.4, heart rate 78, respirations 26, blood pressure 136/91, sating 97% on room air, weight 108.6 kg, BMI 32.5.  GENERAL: Well-nourished, well-developed, Caucasian male currently in minimal distress given headache.  HEAD: Normocephalic, atraumatic.  EYES: Pupils equal, round, and reactive light, extraocular muscles intact, no scleral icterus.  MOUTH: Moist mucosal membrane, dentition intact, no abscess noted.  EAR AND NOSE AND THROAT:  Clear without exudate. No external lesions.  NECK: Supple. No thyromegaly. No nodules. No JVD.  PULMONARY: Clear to auscultation bilaterally without wheezes, rales or rhonchi, no use of accessory muscles, good respiratory effort.  CHEST: Nontender to palpation.  CARDIOVASCULAR: S1, S2, regular rate and rhythm, no murmurs, rubs, or gallops, no edema, pedal pulses are 2+ bilaterally.  GASTROINTESTINAL: Soft, nontender, nondistended, no masses, positive bowel sounds, no hepatosplenomegaly.   MUSCULOSKELETAL: No swelling, clubbing, or edema. Range of motion limited to the left upper and lower extremity given weakness.  NEUROLOGIC: Cranial nerves II through XII intact, does have strength 5/5 in her right upper and lower extremity in both proximal and distal flexion and extension and as compared to the left, the left upper extremity hand grip 3 out of 5, with strength 3 out of 5 in both proximal and distal flexion and extension, which  is the same as the left lower extremity 3  out of 5.  SKIN: No ulceration, lesions, rashes, cyanosis, warm, dry, turgor intact.  PSYCHIATRIC: Mood, affect blunted, he is awake, alert, oriented x 3, insight and judgment intact.   DIAGNOSTIC DATA: Sodium 142, potassium 3.9, chloride 110, bicarbonate 24, BUN 12, creatinine 0.88, glucose 122, protein 6.2, albumin 3.2, otherwise LFTs within normal limits; WBC of 7.6, hemoglobin 14.7, platelets of 208,000; urinalysis negative for evidence of infection; CT head performed, no acute cardiopulmonary process.   ASSESSMENT AND PLAN: A 54 year old Caucasian gentleman with a history of type 2 diabetes complicated by neuropathy as well as a history of cerebrovascular accident, presenting with worsening left-sided weakness.  1.  Transient ischemic attack. He does have improvement in neurological symptoms at this time. We will place him on telemetry under observational status, increase his aspirin therapy to 325 from 81. We will also continue the statin therapy. We will check an MRI, lipid panel and transthoracic echocardiogram as well as Doppler and performed q. 4 hour neurology checks.  2.  Type 2 diabetes, insulin-requiring, complicated by peripheral neuropathy. Place on insulin sliding scale with q. 6 hour Accu-Cheks. Hold p.o. agents. Continue his home doses of 70/30,  3.  Hyperlipidemia: Statin therapy.  4.  Neuropathy. Continue with Lyrica.  5.  Deep venous thrombosis prophylaxis with sequential compression devices.   Time spent:  Was 45 minutes.     ____________________________ Aaron Mose. Hower, MD dkh:nt D: 05/11/2014 21:40:29 ET T: 05/11/2014 22:22:06 ET JOB#: [id number]  cc: Aaron Mose. Hower, MD, <Dictator> DAVID Woodfin Ganja MD ELECTRONICALLY SIGNED 05/12/2014 0:44

## 2014-11-21 NOTE — Discharge Summary (Signed)
PATIENT NAME:  Michael Gill, Michael Gill MR#:  [id number] DATE OF BIRTH:  [birth year]  DATE OF ADMISSION:  05/11/2014 DATE OF DISCHARGE:  05/12/2014  ADMITTING DIAGNOSIS: Left-sided weakness.   DISCHARGE DIAGNOSES: 1.  Left-sided weakness, possibly due to a transient ischemic attack, now symptoms are resolved.  2.  Type 2 diabetes.  3.  Peripheral neuropathy.  4.  Hyperlipidemia.  5.  Previous history of CVA with residual left-sided weakness.   CONSULTANTS: None.   PERTINENT LABORATORIES AND EVALUATIONS: Urinalysis was negative on admission. Admitting glucose 122, BUN 12, creatinine 0.88, sodium 142, potassium 3.9, chloride 110, CO2 is 24. LFTs were normal, except total protein of 6.2 and albumin of 3.2. INR 1.0. WBC 7.6, hemoglobin 14.7, platelet count 208. CT scan of the head without contrast showed negative CT of the head. Fasting lipid panel: Total cholesterol 91, triglycerides 102, HDL 33, LDL 38. Echocardiogram of the heart showed normal EF. No significant valvular disease. MRI of the brain showed unchanged appearance to the brain without evidence of acute CVA. Bilateral carotid Dopplers showed no significant atherosclerotic plaque or stenosis.   HOSPITAL COURSE: Please refer to H and P done by the admitting physician. The patient is a 54 year old white male who has a history of previous CVA, presented with left-sided weakness. The patient's symptoms lasted for a few hours. He was brought to the ED and had a CT scan which was negative. The patient went further evaluation with an echocardiogram, carotid Dopplers, and MRI of the brain without any evidence of acute CVA. He was thought to have a possible TIA. The patient was on an aspirin. His aspirin was changed to Aggrenox. The patient at this time is doing much better, back to baseline, and is stable for discharge.   DISCHARGE MEDICATIONS: Atorvastatin 40 at bedtime, Oseni  25/50 mg daily, Lyrica 75 one tab p.o. b.i.d., trazodone 50 mg 1 to 2 tabs at  bedtime as needed, NovoLog 70/30 with 45 units in the morning, acetaminophen oxycodone 325/5 mg 1 tab p.o. q. 6 p.r.n. for pain, Aggrenox 1 tab p.o. b.i.d.   DISCHARGE DIET: Low-sodium, low-fat, low-cholesterol.   ACTIVITY: As tolerated.   FOLLOWUP: With primary MD in 1 to 2 weeks.    TIME SPENT: 35 minutes on this patient.    ____________________________ Lafonda Mosses. Posey Pronto, MD shp:at D: 05/13/2014 12:10:33 ET T: 05/13/2014 16:14:31 ET JOB#: [id number]  cc: Laroy Mustard H. Posey Pronto, MD, <Dictator> Alric Seton MD ELECTRONICALLY SIGNED 05/25/2014 13:57

## 2014-11-24 ENCOUNTER — Other Ambulatory Visit: Payer: Self-pay | Admitting: Family Medicine

## 2014-11-27 ENCOUNTER — Ambulatory Visit
Admit: 2014-11-27 | Disposition: A | Discharge status: Home / Self Care | Payer: Self-pay | Attending: Family Medicine | Admitting: Family Medicine

## 2015-01-31 ENCOUNTER — Emergency Department
Admission: EM | Admit: 2015-01-31 | Discharge: 2015-01-31 | Disposition: A | Discharge status: Home / Self Care | Payer: Medicare HMO | Attending: Emergency Medicine | Admitting: Emergency Medicine

## 2015-01-31 ENCOUNTER — Encounter: Payer: Self-pay | Admitting: Emergency Medicine

## 2015-01-31 DIAGNOSIS — Z794 Long term (current) use of insulin: Secondary | ICD-10-CM | POA: Insufficient documentation

## 2015-01-31 DIAGNOSIS — E119 Type 2 diabetes mellitus without complications: Secondary | ICD-10-CM | POA: Diagnosis not present

## 2015-01-31 DIAGNOSIS — L500 Allergic urticaria: Secondary | ICD-10-CM | POA: Diagnosis not present

## 2015-01-31 DIAGNOSIS — Z79899 Other long term (current) drug therapy: Secondary | ICD-10-CM | POA: Diagnosis not present

## 2015-01-31 DIAGNOSIS — Z7982 Long term (current) use of aspirin: Secondary | ICD-10-CM | POA: Diagnosis not present

## 2015-01-31 DIAGNOSIS — R21 Rash and other nonspecific skin eruption: Secondary | ICD-10-CM | POA: Diagnosis present

## 2015-01-31 DIAGNOSIS — L509 Urticaria, unspecified: Secondary | ICD-10-CM

## 2015-01-31 DIAGNOSIS — T7840XA Allergy, unspecified, initial encounter: Secondary | ICD-10-CM

## 2015-01-31 HISTORY — DX: Neuralgia and neuritis, unspecified: M79.2

## 2015-01-31 HISTORY — DX: Acute embolism and thrombosis of unspecified deep veins of unspecified lower extremity: I82.409

## 2015-01-31 MED ORDER — HYDROXYZINE HCL 25 MG PO TABS: 25.0000 mg | ORAL_TABLET | Freq: Four times a day (QID) | ORAL | Status: DC | PRN

## 2015-01-31 MED ORDER — HYDROXYZINE HCL 25 MG PO TABS
ORAL_TABLET | ORAL | Status: AC
  Administered 2015-01-31: 50 mg via ORAL
  Filled 2015-01-31: qty 2

## 2015-01-31 MED ORDER — FAMOTIDINE 20 MG PO TABS: 20.0000 mg | ORAL_TABLET | Freq: Two times a day (BID) | ORAL | Status: DC

## 2015-01-31 MED ORDER — PREDNISONE 20 MG PO TABS
ORAL_TABLET | ORAL | Status: DC
Start: 2015-01-31 — End: 2015-10-08

## 2015-01-31 MED ORDER — PREDNISONE 20 MG PO TABS
40.0000 mg | ORAL_TABLET | Freq: Once | ORAL | Status: AC
  Administered 2015-01-31: 40 mg via ORAL

## 2015-01-31 MED ORDER — FAMOTIDINE 20 MG PO TABS
20.0000 mg | ORAL_TABLET | Freq: Once | ORAL | Status: AC
  Administered 2015-01-31: 20 mg via ORAL

## 2015-01-31 MED ORDER — FAMOTIDINE 20 MG PO TABS
ORAL_TABLET | ORAL | Status: AC
  Administered 2015-01-31: 20 mg via ORAL
  Filled 2015-01-31: qty 1

## 2015-01-31 MED ORDER — HYDROXYZINE HCL 25 MG PO TABS
50.0000 mg | ORAL_TABLET | Freq: Once | ORAL | Status: AC
  Administered 2015-01-31: 50 mg via ORAL

## 2015-01-31 MED ORDER — PREDNISONE 20 MG PO TABS
ORAL_TABLET | ORAL | Status: AC
  Administered 2015-01-31: 40 mg via ORAL
  Filled 2015-01-31: qty 2

## 2015-01-31 NOTE — ED Notes (Signed)
Pt presents to ER alert and in NAD. Pt states he has had rash on his body for several days. Pt reports he has been taking benadryl at home to no relief.

## 2015-01-31 NOTE — ED Notes (Signed)
MD at bedside. 

## 2015-01-31 NOTE — ED Provider Notes (Signed)
[facility] Emergency Department Provider Note  ____________________________________________  Time seen: Approximately 2:05 AM  I have reviewed the triage vital signs and the nursing notes.   HISTORY  Chief Complaint Rash    HPI Michael Gill is a 54 y.o. male who presents with a 4 day history of generalized rash. Denies new exposures - no new medicines, foods, OTC medicines, insect bites, lotions, detergents, etc. Has been taking benadryl without relief of itching. Denies tongue or facial swelling, chest pain or difficulty breathing.   Past Medical History  Diagnosis Date  . Pseudoseizure   . Diabetes mellitus   . Hyperlipidemia   . CVA (cerebral infarction)   . Colon cancer   . Sleep apnea   . B12 deficiency   . CAD (coronary artery disease)     s/p MI  . DVT (deep venous thrombosis)   . Neuropathic pain, leg     Patient Active Problem List   Diagnosis Date Noted  . TIA (transient ischemic attack) 01/19/2014    Past Surgical History  Procedure Laterality Date  . Appendectomy    . Tonsillectomy      Current Outpatient Rx  Name  Route  Sig  Dispense  Refill  . gabapentin (NEURONTIN) 300 MG capsule   Oral   Take 300 mg by mouth at bedtime.         . pregabalin (LYRICA) 75 MG capsule   Oral   Take 75 mg by mouth 2 (two) times daily.         . rivaroxaban (XARELTO) 20 MG TABS tablet   Oral   Take 20 mg by mouth daily with supper.         Marland Kitchen aspirin EC 81 MG tablet   Oral   Take 81 mg by mouth daily.         Marland Kitchen atorvastatin (LIPITOR) 40 MG tablet   Oral   Take 40 mg by mouth daily.         . Insulin Lispro Prot & Lispro (HUMALOG MIX 75/25 KWIKPEN) (75-25) 100 UNIT/ML Kwikpen   Subcutaneous   Inject 45 Units into the skin daily.          Marland Kitchen losartan (COZAAR) 25 MG tablet   Oral   Take 25 mg by mouth daily.         . traZODone (DESYREL) 50 MG tablet   Oral   Take 50-100 mg by mouth at bedtime.            Allergies Codeine and Sulfa antibiotics  Family History  Problem Relation Age of Onset  . Cancer Brother   . Alzheimer's disease Mother     Social History History  Substance Use Topics  . Smoking status: Never Smoker   . Smokeless tobacco: Not on file  . Alcohol Use: No    Review of Systems Constitutional: No fever/chills Eyes: No visual changes. ENT: No sore throat. Cardiovascular: Denies chest pain. Respiratory: Denies shortness of breath. Gastrointestinal: No abdominal pain.  No nausea, no vomiting.  No diarrhea.  No constipation. Genitourinary: Negative for dysuria. Musculoskeletal: Negative for back pain. Skin: Positive for rash. Neurological: Negative for headaches, focal weakness or numbness.  10-point ROS otherwise negative.  ____________________________________________   PHYSICAL EXAM:  VITAL SIGNS: ED Triage Vitals  Enc Vitals Group     BP 01/31/15 0056 147/89 mmHg     Pulse Rate 01/31/15 0056 94     Resp 01/31/15 0056 20  Temp 01/31/15 0056 98.3 F (36.8 C)     Temp Source 01/31/15 0056 Oral     SpO2 01/31/15 0056 100 %     Weight 01/31/15 0056 238 lb (107.956 kg)     Height 01/31/15 0056 5\' 8"  (1.727 m)     Head Cir --      Peak Flow --      Pain Score --      Pain Loc --      Pain Edu? --      Excl. in [place]? --     Constitutional: Alert and oriented. Well appearing and in no acute distress. Eyes: Conjunctivae are normal. PERRL. EOMI. Head: Atraumatic. Nose: No congestion/rhinnorhea. Mouth/Throat: No tongue swelling or angioedema. Mucous membranes are moist.  Oropharynx non-erythematous. Neck: No stridor.   Cardiovascular: Normal rate, regular rhythm. Grossly normal heart sounds.  Good peripheral circulation. Respiratory: Normal respiratory effort.  No retractions. Lungs CTAB. Gastrointestinal: Soft and nontender. No distention. No abdominal bruits. No CVA tenderness. Musculoskeletal: No lower extremity tenderness nor edema.  No  joint effusions. Neurologic:  Normal speech and language. No gross focal neurologic deficits are appreciated. Speech is normal. No gait instability. Skin:  Skin is warm, dry and intact. Diffuse hives noted to all extremities and trunk. Psychiatric: Mood and affect are normal. Speech and behavior are normal.  ____________________________________________   LABS (all labs ordered are listed, but only abnormal results are displayed)  Labs Reviewed - No data to display ____________________________________________  EKG  None ____________________________________________  RADIOLOGY  None ____________________________________________   PROCEDURES  Procedure(s) performed: None  Critical Care performed: No  ____________________________________________   INITIAL IMPRESSION / ASSESSMENT AND PLAN / ED COURSE  Pertinent labs & imaging results that were available during my care of the patient were reviewed by me and considered in my medical decision making (see chart for details).  54y/o male who presents with acute allergic reaction with diffuse hives, no angioedema or airway distress. Discussed with patient and wife - patient is a diabetic, baseline BS 200s. Will prescribe lower dose of prednisone, pepcid, atarax, follow up for allergy testing. Strict return precautions given. Both verbalize understanding and agree with plan of care. ____________________________________________   FINAL CLINICAL IMPRESSION(S) / ED DIAGNOSES  Final diagnoses:  Allergic reaction, initial encounter  Hives      Paulette Blanch, MD 01/31/15 [phone]

## 2015-01-31 NOTE — Discharge Instructions (Signed)
1. Take the following medicines for the next 4 days: Prednisone $RemoveBeforeDE'40mg'aKFyXhRGJJGWlfv$  daily Pepcid $RemoveBefo'20mg'SqutFPTOMYV$  twice daily 2. Take Atarax (#20) as needed for itching. 3. Return to the ER for worsening symptoms, persistent vomiting, difficulty breathing or other concerns.  Allergies Allergies may happen from anything your body is sensitive to. This may be food, medicines, pollens, chemicals, and nearly anything around you in everyday life that produces allergens. An allergen is anything that causes an allergy producing substance. Heredity is often a factor in causing these problems. This means you may have some of the same allergies as your parents. Food allergies happen in all age groups. Food allergies are some of the most severe and life threatening. Some common food allergies are cow's milk, seafood, eggs, nuts, wheat, and soybeans. SYMPTOMS   Swelling around the mouth.  An itchy red rash or hives.  Vomiting or diarrhea.  Difficulty breathing. SEVERE ALLERGIC REACTIONS ARE LIFE-THREATENING. This reaction is called anaphylaxis. It can cause the mouth and throat to swell and cause difficulty with breathing and swallowing. In severe reactions only a trace amount of food (for example, peanut oil in a salad) may cause death within seconds. Seasonal allergies occur in all age groups. These are seasonal because they usually occur during the same season every year. They may be a reaction to molds, grass pollens, or tree pollens. Other causes of problems are house dust mite allergens, pet dander, and mold spores. The symptoms often consist of nasal congestion, a runny itchy nose associated with sneezing, and tearing itchy eyes. There is often an associated itching of the mouth and ears. The problems happen when you come in contact with pollens and other allergens. Allergens are the particles in the air that the body reacts to with an allergic reaction. This causes you to release allergic antibodies. Through a chain of events,  these eventually cause you to release histamine into the blood stream. Although it is meant to be protective to the body, it is this release that causes your discomfort. This is why you were given anti-histamines to feel better. If you are unable to pinpoint the offending allergen, it may be determined by skin or blood testing. Allergies cannot be cured but can be controlled with medicine. Hay fever is a collection of all or some of the seasonal allergy problems. It may often be treated with simple over-the-counter medicine such as diphenhydramine. Take medicine as directed. Do not drink alcohol or drive while taking this medicine. Check with your caregiver or package insert for child dosages. If these medicines are not effective, there are many new medicines your caregiver can prescribe. Stronger medicine such as nasal spray, eye drops, and corticosteroids may be used if the first things you try do not work well. Other treatments such as immunotherapy or desensitizing injections can be used if all else fails. Follow up with your caregiver if problems continue. These seasonal allergies are usually not life threatening. They are generally more of a nuisance that can often be handled using medicine. HOME CARE INSTRUCTIONS   If unsure what causes a reaction, keep a diary of foods eaten and symptoms that follow. Avoid foods that cause reactions.  If hives or rash are present:  Take medicine as directed.  You may use an over-the-counter antihistamine (diphenhydramine) for hives and itching as needed.  Apply cold compresses (cloths) to the skin or take baths in cool water. Avoid hot baths or showers. Heat will make a rash and itching worse.  If you  are severely allergic:  Following a treatment for a severe reaction, hospitalization is often required for closer follow-up.  Wear a medic-alert bracelet or necklace stating the allergy.  You and your family must learn how to give adrenaline or use an  anaphylaxis kit.  If you have had a severe reaction, always carry your anaphylaxis kit or EpiPen with you. Use this medicine as directed by your caregiver if a severe reaction is occurring. Failure to do so could have a fatal outcome. SEEK MEDICAL CARE IF:  You suspect a food allergy. Symptoms generally happen within 30 minutes of eating a food.  Your symptoms have not gone away within 2 days or are getting worse.  You develop new symptoms.  You want to retest yourself or your child with a food or drink you think causes an allergic reaction. Never do this if an anaphylactic reaction to that food or drink has happened before. Only do this under the care of a caregiver. SEEK IMMEDIATE MEDICAL CARE IF:   You have difficulty breathing, are wheezing, or have a tight feeling in your chest or throat.  You have a swollen mouth, or you have hives, swelling, or itching all over your body.  You have had a severe reaction that has responded to your anaphylaxis kit or an EpiPen. These reactions may return when the medicine has worn off. These reactions should be considered life threatening. MAKE SURE YOU:   Understand these instructions.  Will watch your condition.  Will get help right away if you are not doing well or get worse. Document Released: 10/10/2002 Document Revised: 11/11/2012 Document Reviewed: 03/16/2008 [facility] Patient Information 2015 [place], [place]. This information is not intended to replace advice given to you by your health care provider. Make sure you discuss any questions you have with your health care provider.  Hives Hives are itchy, red, swollen areas of the skin. They can vary in size and location on your body. Hives can come and go for hours or several days (acute hives) or for several weeks (chronic hives). Hives do not spread from person to person (noncontagious). They may get worse with scratching, exercise, and emotional stress. CAUSES   Allergic reaction to  food, additives, or drugs.  Infections, including the common cold.  Illness, such as vasculitis, lupus, or thyroid disease.  Exposure to sunlight, heat, or cold.  Exercise.  Stress.  Contact with chemicals. SYMPTOMS   Red or white swollen patches on the skin. The patches may change size, shape, and location quickly and repeatedly.  Itching.  Swelling of the hands, feet, and face. This may occur if hives develop deeper in the skin. DIAGNOSIS  Your caregiver can usually tell what is wrong by performing a physical exam. Skin or blood tests may also be done to determine the cause of your hives. In some cases, the cause cannot be determined. TREATMENT  Mild cases usually get better with medicines such as antihistamines. Severe cases may require an emergency epinephrine injection. If the cause of your hives is known, treatment includes avoiding that trigger.  HOME CARE INSTRUCTIONS   Avoid causes that trigger your hives.  Take antihistamines as directed by your caregiver to reduce the severity of your hives. Non-sedating or low-sedating antihistamines are usually recommended. Do not drive while taking an antihistamine.  Take any other medicines prescribed for itching as directed by your caregiver.  Wear loose-fitting clothing.  Keep all follow-up appointments as directed by your caregiver. SEEK MEDICAL CARE IF:  You have persistent or severe itching that is not relieved with medicine.  You have painful or swollen joints. SEEK IMMEDIATE MEDICAL CARE IF:   You have a fever.  Your tongue or lips are swollen.  You have trouble breathing or swallowing.  You feel tightness in the throat or chest.  You have abdominal pain. These problems may be the first sign of a life-threatening allergic reaction. Call your local emergency services (911 in [place] MAKE SURE YOU:   Understand these instructions.  Will watch your condition.  Will get help right away if you are not doing  well or get worse. Document Released: 07/17/2005 Document Revised: 07/22/2013 Document Reviewed: 10/10/2011 [facility] Patient Information 2015 [place], [place]. This information is not intended to replace advice given to you by your health care provider. Make sure you discuss any questions you have with your health care provider.

## 2015-02-11 ENCOUNTER — Other Ambulatory Visit: Payer: Self-pay

## 2015-02-11 ENCOUNTER — Emergency Department: Payer: Medicare HMO

## 2015-02-11 ENCOUNTER — Emergency Department
Admission: EM | Admit: 2015-02-11 | Discharge: 2015-02-11 | Disposition: A | Discharge status: Home / Self Care | Payer: Medicare HMO | Attending: Emergency Medicine | Admitting: Emergency Medicine

## 2015-02-11 ENCOUNTER — Encounter: Payer: Self-pay | Admitting: *Deleted

## 2015-02-11 DIAGNOSIS — Z7952 Long term (current) use of systemic steroids: Secondary | ICD-10-CM | POA: Insufficient documentation

## 2015-02-11 DIAGNOSIS — E119 Type 2 diabetes mellitus without complications: Secondary | ICD-10-CM | POA: Diagnosis not present

## 2015-02-11 DIAGNOSIS — R569 Unspecified convulsions: Secondary | ICD-10-CM | POA: Diagnosis present

## 2015-02-11 DIAGNOSIS — Z7982 Long term (current) use of aspirin: Secondary | ICD-10-CM | POA: Insufficient documentation

## 2015-02-11 DIAGNOSIS — Z79899 Other long term (current) drug therapy: Secondary | ICD-10-CM | POA: Diagnosis not present

## 2015-02-11 DIAGNOSIS — Z794 Long term (current) use of insulin: Secondary | ICD-10-CM | POA: Insufficient documentation

## 2015-02-11 LAB — CBC WITH DIFFERENTIAL/PLATELET
BASOS ABS: 0 10*3/uL (ref 0–0.1)
BASOS PCT: 1 %
EOS ABS: 0.2 10*3/uL (ref 0–0.7)
Eosinophils Relative: 2 %
HCT: 45.4 % (ref 40.0–52.0)
Hemoglobin: 15.4 g/dL (ref 13.0–18.0)
LYMPHS PCT: 22 %
Lymphs Abs: 1.7 10*3/uL (ref 1.0–3.6)
MCH: 29.9 pg (ref 26.0–34.0)
MCHC: 33.8 g/dL (ref 32.0–36.0)
MCV: 88.6 fL (ref 80.0–100.0)
MONOS PCT: 7 %
Monocytes Absolute: 0.6 10*3/uL (ref 0.2–1.0)
NEUTROS ABS: 5.2 10*3/uL (ref 1.4–6.5)
Neutrophils Relative %: 68 %
Platelets: 189 10*3/uL (ref 150–440)
RBC: 5.13 MIL/uL (ref 4.40–5.90)
RDW: 13.7 % (ref 11.5–14.5)
WBC: 7.8 10*3/uL (ref 3.8–10.6)

## 2015-02-11 LAB — BASIC METABOLIC PANEL
ANION GAP: 10 (ref 5–15)
BUN: 16 mg/dL (ref 6–20)
CALCIUM: 9 mg/dL (ref 8.9–10.3)
CHLORIDE: 101 mmol/L (ref 101–111)
CO2: 23 mmol/L (ref 22–32)
CREATININE: 1.02 mg/dL (ref 0.61–1.24)
GFR calc Af Amer: >60 mL/min (ref >60)
GFR calc non Af Amer: >60 mL/min (ref >60)
Glucose, Bld: 259 mg/dL — ABNORMAL HIGH (ref 65–99)
POTASSIUM: 4 mmol/L (ref 3.5–5.1)
SODIUM: 134 mmol/L — AB (ref 135–145)

## 2015-02-11 LAB — TROPONIN I

## 2015-02-11 MED ORDER — LORAZEPAM 2 MG/ML IJ SOLN
1.0000 mg | Freq: Once | INTRAMUSCULAR | Status: AC
  Administered 2015-02-11: 1 mg via INTRAVENOUS
  Filled 2015-02-11: qty 1

## 2015-02-11 NOTE — Discharge Instructions (Signed)
Nonepileptic Seizures °Nonepileptic seizures are seizures that are not caused by abnormal electrical signals in your brain. These seizures often seem like epileptic seizures, but they are not caused by epilepsy.  °There are two types of nonepileptic seizures: °· A physiologic nonepileptic seizure results from a disruption in your brain. °· A psychogenic seizure results from emotional stress. These seizures are sometimes called pseudoseizures. °CAUSES  °Causes of physiologic nonepileptic seizures include:  °· Sudden drop in blood pressure. °· Low blood sugar. °· Low levels of salt (sodium) in your blood. °· Low levels of calcium in your blood. °· Migraine. °· Heart rhythm problems. °· Sleep disorders. °· Drug and alcohol abuse. °Common causes of psychogenic nonepileptic seizures include: °· Stress. °· Emotional trauma. °· Sexual or physical abuse. °· Major life events, such as divorce or the death of a loved one. °· Mental health disorders, including panic attack and hyperactivity disorder. °SIGNS AND SYMPTOMS °A nonepileptic seizure can look like an epileptic seizure, including uncontrollable shaking (convulsions), or changes in attention, behavior, or the ability to remain awake and alert. However, there are some differences. Nonepileptic seizures usually: °· Do not cause physical injuries. °· Start slowly. °· Include crying or shrieking. °· Last longer than 2 minutes. °· Have a short recovery time without headache or exhaustion. °DIAGNOSIS  °Your health care provider can usually diagnose nonepileptic seizures after taking your medical history and giving you a physical exam. Your health care provider may want to talk to your friends or relatives who have seen you have a seizure.  °You may also need to have tests to look for causes of physiologic nonepileptic seizures. This may include an electroencephalogram (EEG), which is a test that measures electrical activity in your brain. If you have had an epileptic  seizure, the results of your EEG will be abnormal. If your health care provider thinks you have had a psychogenic nonepileptic seizure, you may need to see a mental health specialist for an evaluation. °TREATMENT  °Treatment depends on the type and cause of your seizures. °· For physiologic nonepileptic seizures, treatment is aimed at addressing the underlying condition that caused the seizures. These seizures usually stop when the underlying condition is properly treated. °· Nonepileptic seizures do not respond to the seizure medicines used to treat epilepsy. °· For psychogenic seizures, you may need to work with a mental health specialist. °HOME CARE INSTRUCTIONS °Home care will depend on the type of nonepileptic seizures you have.  °· Follow all your health care provider's instructions. °· Keep all your follow-up appointments. °SEEK MEDICAL CARE IF: °You continue to have seizures after treatment. °SEEK IMMEDIATE MEDICAL CARE IF: °· Your seizures change or become more frequent. °· You injure yourself during a seizure. °· You have one seizure after another. °· You have trouble recovering from a seizure. °· You have chest pain or trouble breathing. °MAKE SURE YOU: °· Understand these instructions. °· Will watch your condition. °· Will get help right away if you are not doing well or get worse. °Document Released: 09/01/2005 Document Revised: 12/01/2013 Document Reviewed: 05/13/2013 °ExitCare® Patient Information ©2015 [employer]. This information is not intended to replace advice given to you by your health care provider. Make sure you discuss any questions you have with your health care provider. ° °

## 2015-02-11 NOTE — ED Notes (Signed)
Wife and daughter are at bedside

## 2015-02-11 NOTE — ED Provider Notes (Signed)
[facility] Emergency Department Provider Note  ____________________________________________  Time seen: Seen upon arrival to the emergency department  I have reviewed the triage vital signs and the nursing notes.   HISTORY  Chief Complaint Seizures    HPI Michael Gill is a 54 y.o. male with a history of pseudoseizures and CVA who presents today with a pseudoseizure that started 20 minutes prior to arrival. Per the medics and the patient's wife the patient had been complaining of chest pain for the past 2-3 weeks and increased weakness. He had several falls earlier this week. He was going to his primary care doctor be evaluated for these issues when he began to have atypical pseudoseizure per his wife. He has been mumbling with repetitive movements to his lips. Per the medics, he was following some simple commands and responsive to pain. The wife says that he often has these pseudoseizures in response to pain. She says that they started about a year ago after his stroke. She says that his chest pain has been over the past 2-3 weeks. She does not know the location but says that it is worse with exertion and that the patient has experienced shortness of breath as well.   Past Medical History  Diagnosis Date  . Pseudoseizure   . Diabetes mellitus   . Hyperlipidemia   . CVA (cerebral infarction)   . Colon cancer   . Sleep apnea   . B12 deficiency   . CAD (coronary artery disease)     s/p MI  . DVT (deep venous thrombosis)   . Neuropathic pain, leg     Patient Active Problem List   Diagnosis Date Noted  . TIA (transient ischemic attack) 01/19/2014    Past Surgical History  Procedure Laterality Date  . Appendectomy    . Tonsillectomy      Current Outpatient Rx  Name  Route  Sig  Dispense  Refill  . aspirin EC 81 MG tablet   Oral   Take 81 mg by mouth daily.         Marland Kitchen atorvastatin (LIPITOR) 40 MG tablet   Oral   Take 40 mg by mouth daily.          . famotidine (PEPCID) 20 MG tablet   Oral   Take 1 tablet (20 mg total) by mouth 2 (two) times daily.   8 tablet   0   . gabapentin (NEURONTIN) 300 MG capsule   Oral   Take 300 mg by mouth at bedtime.         . hydrOXYzine (ATARAX/VISTARIL) 25 MG tablet   Oral   Take 1 tablet (25 mg total) by mouth every 6 (six) hours as needed for itching.   20 tablet   0   . Insulin Lispro Prot & Lispro (HUMALOG MIX 75/25 KWIKPEN) (75-25) 100 UNIT/ML Kwikpen   Subcutaneous   Inject 45 Units into the skin daily.          Marland Kitchen losartan (COZAAR) 25 MG tablet   Oral   Take 25 mg by mouth daily.         . predniSONE (DELTASONE) 20 MG tablet      2 tablets daily x 4 days   8 tablet   0   . pregabalin (LYRICA) 75 MG capsule   Oral   Take 75 mg by mouth 2 (two) times daily.         . rivaroxaban (XARELTO) 20 MG TABS tablet  Oral   Take 20 mg by mouth daily with supper.         . traZODone (DESYREL) 50 MG tablet   Oral   Take 50-100 mg by mouth at bedtime.           Allergies Codeine and Sulfa antibiotics  Family History  Problem Relation Age of Onset  . Cancer Brother   . Alzheimer's disease Mother     Social History History  Substance Use Topics  . Smoking status: Never Smoker   . Smokeless tobacco: Not on file  . Alcohol Use: No    Review of Systems Caveat secondary to patient having active pseudoseizure and being unable to answer questions.   ____________________________________________   PHYSICAL EXAM:  VITAL SIGNS: ED Triage Vitals  Enc Vitals Group     BP 02/11/15 1617 148/96 mmHg     Pulse Rate 02/11/15 1617 93     Resp 02/11/15 1617 18     Temp 02/11/15 1617 98.8 F (37.1 C)     Temp Source 02/11/15 1617 Axillary     SpO2 02/11/15 1617 96 %     Weight 02/11/15 1617 245 lb 11.2 oz (111.449 kg)     Height 02/11/15 1617 5\' 7"  (1.702 m)     Head Cir --      Peak Flow --      Pain Score --      Pain Loc --      Pain Edu? --      Excl.  in [place]? --     Constitutional: Eyes clenched and mumbling.  Eyes: Conjunctivae are normal. PERRL.  Head: Atraumatic. Nose: No congestion/rhinnorhea. Mouth/Throat: Mucous membranes are moist.  Oropharynx non-erythematous. Neck: No stridor.   Cardiovascular: Normal rate, regular rhythm. Grossly normal heart sounds.  Good peripheral circulation. Respiratory: Normal respiratory effort.  No retractions. Lungs CTAB. Gastrointestinal: Soft and nontender. No distention. No abdominal bruits. No CVA tenderness. Umbilical hernia which is soft and easily reducible. It is small and just feels the umbilicus without one to 2 cm at its largest diameter. Musculoskeletal: Mild to moderate bilateral lower extremity edema which is worse on the right side where the patient has a DVT. The patient's wife says that this is his baseline..  No joint effusions. Neurologic:  Normal speech and language. No gross focal neurologic deficits are appreciated. No gait instability. Skin:  Skin is warm, dry and intact. No rash noted. Psychiatric: Mood and affect are normal. Speech and behavior are normal.  ____________________________________________   LABS (all labs ordered are listed, but only abnormal results are displayed)  Labs Reviewed  BASIC METABOLIC PANEL - Abnormal; Notable for the following:    Sodium 134 (*)    Glucose, Bld 259 (*)    All other components within normal limits  CBC WITH DIFFERENTIAL/PLATELET  TROPONIN I   ____________________________________________  EKG  ED ECG REPORT I, Doran Stabler, the attending physician, personally viewed and interpreted this ECG.   Date: 02/11/2015  EKG Time: 1616  Rate: 44  Rhythm: normal sinus rhythm  Axis: Normal axis  Intervals:none  ST&T Change: No ST elevations or depressions. No abnormal T-wave inversions.  ____________________________________________  RADIOLOGY  No acute cardiopulmonary disease. I personally reviewed these images.  Mild  ethmoid sinus disease. No acute appearing infarct. ____________________________________________   PROCEDURES   ____________________________________________   INITIAL IMPRESSION / ASSESSMENT AND PLAN / ED COURSE  Pertinent labs & imaging results that were available during my care of  the patient were reviewed by me and considered in my medical decision making (see chart for details). ----------------------------------------- 7:59 PM on 02/11/2015 -----------------------------------------  Patient is now awake and alert. Prior to the CAT scan the patient had left-sided weakness in the patient as well as his family were concerned that he was having another stroke. However, when I examined the patient he appeared to be purposely moving his lips to the left creative facial droop. Furthermore, he had good tone in the upper and lower extremities despite saying that he was unable to move them. At the time at this reevaluation at 7:45 PM the patient has 5 out of 5 strength throughout and is able to ambulate with assistance at his baseline. He is slightly agitated which the patient's wife says is typical for him after he has his pseudoseizures. His condition has greatly improved with Ativan. His CAT scan as well as labs are reassuring. He will be discharged to home. He will follow back up with the [facility] for his ongoing chronic issues of weakness and chest pain. I offered to do a urinalysis but however, the patient would rather be discharged home at this time. We did discuss that this could be a cause of his weakness. However, the patient understands this and says that he does not want to stay for this further testing. Furthermore he denies any dysuria, abdominal pain. ____________________________________________   FINAL CLINICAL IMPRESSION(S) / ED DIAGNOSES  Acute pseudoseizure. Initial visit.    Orbie Pyo, MD 02/11/15 [facility]

## 2015-03-14 ENCOUNTER — Emergency Department: Payer: Medicare HMO

## 2015-03-14 ENCOUNTER — Emergency Department
Admission: EM | Admit: 2015-03-14 | Discharge: 2015-03-14 | Disposition: A | Discharge status: Home / Self Care | Payer: Medicare HMO | Attending: Emergency Medicine | Admitting: Emergency Medicine

## 2015-03-14 ENCOUNTER — Encounter: Payer: Self-pay | Admitting: Emergency Medicine

## 2015-03-14 ENCOUNTER — Ambulatory Visit
Admission: EM | Admit: 2015-03-14 | Discharge: 2015-03-14 | Disposition: A | Discharge status: Other Acute Inpatient Hospital | Payer: Medicare HMO | Attending: Emergency Medicine | Admitting: Emergency Medicine

## 2015-03-14 DIAGNOSIS — R109 Unspecified abdominal pain: Secondary | ICD-10-CM

## 2015-03-14 DIAGNOSIS — E119 Type 2 diabetes mellitus without complications: Secondary | ICD-10-CM | POA: Diagnosis not present

## 2015-03-14 DIAGNOSIS — Z7901 Long term (current) use of anticoagulants: Secondary | ICD-10-CM | POA: Insufficient documentation

## 2015-03-14 DIAGNOSIS — M79604 Pain in right leg: Secondary | ICD-10-CM | POA: Diagnosis not present

## 2015-03-14 DIAGNOSIS — M545 Low back pain, unspecified: Secondary | ICD-10-CM

## 2015-03-14 DIAGNOSIS — I82401 Acute embolism and thrombosis of unspecified deep veins of right lower extremity: Secondary | ICD-10-CM | POA: Diagnosis not present

## 2015-03-14 DIAGNOSIS — Z79899 Other long term (current) drug therapy: Secondary | ICD-10-CM | POA: Diagnosis not present

## 2015-03-14 DIAGNOSIS — R2241 Localized swelling, mass and lump, right lower limb: Secondary | ICD-10-CM | POA: Diagnosis present

## 2015-03-14 LAB — COMPREHENSIVE METABOLIC PANEL
ALT: 23 U/L (ref 17–63)
AST: 20 U/L (ref 15–41)
Albumin: 4.1 g/dL (ref 3.5–5.0)
Alkaline Phosphatase: 59 U/L (ref 38–126)
Anion gap: 10 (ref 5–15)
BILIRUBIN TOTAL: 0.6 mg/dL (ref 0.3–1.2)
BUN: 11 mg/dL (ref 6–20)
CO2: 23 mmol/L (ref 22–32)
Calcium: 9.4 mg/dL (ref 8.9–10.3)
Chloride: 106 mmol/L (ref 101–111)
Creatinine, Ser: 1.14 mg/dL (ref 0.61–1.24)
GFR calc Af Amer: >60 mL/min (ref >60)
Glucose, Bld: 164 mg/dL — ABNORMAL HIGH (ref 65–99)
Potassium: 4 mmol/L (ref 3.5–5.1)
Sodium: 139 mmol/L (ref 135–145)
Total Protein: 6.9 g/dL (ref 6.5–8.1)

## 2015-03-14 LAB — CBC
HCT: 47.3 % (ref 40.0–52.0)
HEMOGLOBIN: 15.9 g/dL (ref 13.0–18.0)
MCH: 29.6 pg (ref 26.0–34.0)
MCHC: 33.7 g/dL (ref 32.0–36.0)
MCV: 88 fL (ref 80.0–100.0)
PLATELETS: 201 10*3/uL (ref 150–440)
RBC: 5.38 MIL/uL (ref 4.40–5.90)
RDW: 13.2 % (ref 11.5–14.5)
WBC: 7 10*3/uL (ref 3.8–10.6)

## 2015-03-14 LAB — URINALYSIS COMPLETE WITH MICROSCOPIC (ARMC ONLY)
Bacteria, UA: NONE SEEN
Bilirubin Urine: NEGATIVE
Glucose, UA: 150 mg/dL — AB
HGB URINE DIPSTICK: NEGATIVE
Leukocytes, UA: NEGATIVE
Nitrite: NEGATIVE
PH: 5 (ref 5.0–8.0)
Protein, ur: 30 mg/dL — AB
SPECIFIC GRAVITY, URINE: 1.029 (ref 1.005–1.030)
SQUAMOUS EPITHELIAL / LPF: NONE SEEN

## 2015-03-14 LAB — PROTIME-INR
INR: 1.85
Prothrombin Time: 21.5 seconds — ABNORMAL HIGH (ref 11.4–15.0)

## 2015-03-14 LAB — APTT: APTT: 36 s (ref 24–36)

## 2015-03-14 MED ORDER — HYDROMORPHONE HCL 2 MG PO TABS: 2.0000 mg | ORAL_TABLET | Freq: Two times a day (BID) | ORAL | Status: DC | PRN

## 2015-03-14 MED ORDER — OXYCODONE-ACETAMINOPHEN 5-325 MG PO TABS: 2.0000 | ORAL_TABLET | Freq: Once | ORAL | Status: DC

## 2015-03-14 MED ORDER — OXYCODONE-ACETAMINOPHEN 5-325 MG PO TABS
1.0000 | ORAL_TABLET | Freq: Once | ORAL | Status: AC
Start: 2015-03-14 — End: 2015-03-14
  Administered 2015-03-14: 1 via ORAL

## 2015-03-14 MED ORDER — ONDANSETRON HCL 4 MG PO TABS: 4.0000 mg | ORAL_TABLET | Freq: Three times a day (TID) | ORAL | Status: DC | PRN

## 2015-03-14 MED ORDER — HYDROMORPHONE HCL 2 MG PO TABS
2.0000 mg | ORAL_TABLET | Freq: Once | ORAL | Status: AC
  Administered 2015-03-14: 2 mg via ORAL
  Filled 2015-03-14: qty 1

## 2015-03-14 MED ORDER — ONDANSETRON 4 MG PO TBDP
4.0000 mg | ORAL_TABLET | Freq: Once | ORAL | Status: AC
  Administered 2015-03-14: 4 mg via ORAL
  Filled 2015-03-14: qty 1

## 2015-03-14 NOTE — Discharge Instructions (Signed)
Abdominal Pain °Many things can cause abdominal pain. Usually, abdominal pain is not caused by a disease and will improve without treatment. It can often be observed and treated at home. Your health care provider will do a physical exam and possibly order blood tests and X-rays to help determine the seriousness of your pain. However, in many cases, more time must pass before a clear cause of the pain can be found. Before that point, your health care provider may not know if you need more testing or further treatment. °HOME CARE INSTRUCTIONS  °Monitor your abdominal pain for any changes. The following actions may help to alleviate any discomfort you are experiencing: °· Only take over-the-counter or prescription medicines as directed by your health care provider. °· Do not take laxatives unless directed to do so by your health care provider. °· Try a clear liquid diet (broth, tea, or water) as directed by your health care provider. Slowly move to a bland diet as tolerated. °SEEK MEDICAL CARE IF: °· You have unexplained abdominal pain. °· You have abdominal pain associated with nausea or diarrhea. °· You have pain when you urinate or have a bowel movement. °· You experience abdominal pain that wakes you in the night. °· You have abdominal pain that is worsened or improved by eating food. °· You have abdominal pain that is worsened with eating fatty foods. °· You have a fever. °SEEK IMMEDIATE MEDICAL CARE IF:  °· Your pain does not go away within 2 hours. °· You keep throwing up (vomiting). °· Your pain is felt only in portions of the abdomen, such as the right side or the left lower portion of the abdomen. °· You pass bloody or black tarry stools. °MAKE SURE YOU: °· Understand these instructions.   °· Will watch your condition.   °· Will get help right away if you are not doing well or get worse.   °Document Released: 04/26/2005 Document Revised: 07/22/2013 Document Reviewed: 03/26/2013 °[employer]® Patient Information  ©2015 [employer]. This information is not intended to replace advice given to you by your health care provider. Make sure you discuss any questions you have with your health care provider. ° °Please return immediately if condition worsens. Please contact her primary physician or the physician you were given for referral. If you have any specialist physicians involved in her treatment and plan please also contact them. Thank you for using [facility]. ° °

## 2015-03-14 NOTE — ED Notes (Signed)
Pt to go into room 17 once back from [place]

## 2015-03-14 NOTE — ED Provider Notes (Signed)
HPI  SUBJECTIVE:  Michael Gill is a 54 y.o. male who presents with 2 complaints. First he complains intermittent, waxing and waning, throbbing  right flank pain for several days. The back pain has been going on for 3-4 weeks. States that the pain started radiating to his groin today. Symptoms are worse with walking and bending over, no alleviating factors. He tried ibuprofen and Tylenol. He reports nausea, no vomiting, fevers.  No urinary urgency, frequency, cloudy or odorous urine, hematuria. No change in chronic right testicular swelling. No testicular pain. He states that this feels similar to when he had a left-sided nonobstructing kidney stone.   Second, he reports worsening swelling and pain in his right lower extremity where he has a known DVT. This has been going on for several days. He reports worsening burning, tingling, numbness He is currently on Xarelto, states that he is compliant with this. The symptoms are worse with walking, no alleviating factors. He  is supposed to have an appointment to see if the blood clot has extended. This has not yet been scheduled. He denies chest pain, coughing, wheezing, shortness of breath, hemoptysis.  Patient has an extensive medical history including CVA with aphasia, pseudoseizures, diabetes with diabetic neuropathy, not obstructing kidney stones, status post appendectomy, DVT right lower extremity, MI 25 years ago. He was admitted for sepsis on 11/18/14. He was last seen in the ED for pseudoseizures on 7/14.  Past Medical History  Diagnosis Date  . Pseudoseizure   . Diabetes mellitus   . Hyperlipidemia   . CVA (cerebral infarction)   . Colon cancer   . Sleep apnea   . B12 deficiency   . CAD (coronary artery disease)     s/p MI  . DVT (deep venous thrombosis)   . Neuropathic pain, leg     Past Surgical History  Procedure Laterality Date  . Appendectomy    . Tonsillectomy      Family History  Problem Relation Age of Onset  . Cancer  Brother   . Alzheimer's disease Mother     Social History  Substance Use Topics  . Smoking status: Never Smoker   . Smokeless tobacco: None  . Alcohol Use: No    No current facility-administered medications for this encounter.  Current outpatient prescriptions:  .  aspirin EC 81 MG tablet, Take 81 mg by mouth daily., Disp: , Rfl:  .  atorvastatin (LIPITOR) 40 MG tablet, Take 40 mg by mouth daily., Disp: , Rfl:  .  famotidine (PEPCID) 20 MG tablet, Take 1 tablet (20 mg total) by mouth 2 (two) times daily., Disp: 8 tablet, Rfl: 0 .  gabapentin (NEURONTIN) 300 MG capsule, Take 300 mg by mouth at bedtime., Disp: , Rfl:  .  hydrOXYzine (ATARAX/VISTARIL) 25 MG tablet, Take 1 tablet (25 mg total) by mouth every 6 (six) hours as needed for itching., Disp: 20 tablet, Rfl: 0 .  Insulin Lispro Prot & Lispro (HUMALOG MIX 75/25 KWIKPEN) (75-25) 100 UNIT/ML Kwikpen, Inject 60 Units into the skin daily. , Disp: , Rfl:  .  losartan (COZAAR) 25 MG tablet, Take 25 mg by mouth daily., Disp: , Rfl:  .  predniSONE (DELTASONE) 20 MG tablet, 2 tablets daily x 4 days, Disp: 8 tablet, Rfl: 0 .  pregabalin (LYRICA) 75 MG capsule, Take 75 mg by mouth 2 (two) times daily., Disp: , Rfl:  .  rivaroxaban (XARELTO) 20 MG TABS tablet, Take 20 mg by mouth daily with supper., Disp: , Rfl:  .  traZODone (DESYREL) 50 MG tablet, Take 50-100 mg by mouth at bedtime., Disp: , Rfl:   Allergies  Allergen Reactions  . Codeine     hallucinations  . Shellfish Allergy Hives  . Sulfa Antibiotics Hives     ROS  As noted in HPI.   Physical Exam  BP 139/93 mmHg  Pulse 89  Temp(Src) 97.7 F (36.5 C) (Oral)  Resp 16  Ht 5\' 8"  (1.727 m)  Wt 200 lb (90.719 kg)  BMI 30.42 kg/m2  SpO2 99%  Constitutional: Well developed, well nourished, appears uncomfortable Eyes: PERRL, EOMI, conjunctiva normal bilaterally HENT: Normocephalic, atraumatic,mucus membranes moist Respiratory: Clear to auscultation bilaterally, no rales,  no wheezing, no rhonchi Cardiovascular: Normal rate and rhythm, no murmurs, no gallops, no rubs GI: Soft, nondistended, normal bowel sounds, tenderness along the right flank,  suprapubic tenderness. Negative Murphy, negative McBurney. no rebound, no guarding Back: + R CVAT skin: No rash, skin intact Musculoskeletal: + edema RLE, R calf size > L, + tenderness behind the popliteal fossa, tenderness along the medial thigh and in the groin. Toes warm, pink, cap refill less than 2 seconds. DP 2+. Neurologic: Alert & oriented x 3, CN II-XII grossly intact, no motor deficits, sensation grossly intact Psychiatric: Speech and behavior appropriate   ED Course   Medications  oxyCODONE-acetaminophen (PERCOCET/ROXICET) 5-325 MG per tablet 1 tablet (1 tablet Oral Given 03/14/15 1540)    No orders of the defined types were placed in this encounter.   No results found for this or any previous visit (from the past 24 hour(s)). No results found.  ED Clinical Impression  Pain of right lower extremity  Flank pain  Right-sided low back pain without sciatica  DVT of leg (deep venous thrombosis), right   ED Assessment/Plan  [facility] narcotic database reviewed. Last prescription was for cough syrup in April. He is not on any chronic opiates.  Patient appears to have 2 problems, first, concern for kidney stone given H&P. However, he may have extension of the DVT into the pelvis/abdomen causing his symptoms. Transferring to the ED for comprehensive evaluation and imaging. withholdingToradol as patient is on Xarelto, but gave single Percocet as patient is opiate nave. Vitals are acceptable, patient is neurologically intact, feel that he is stable to go by private vehicle. Discussed rationale for transfer and the need to go to the ED immediately. Family agrees with plan.  *This clinic note was created using Dragon dictation software. Therefore, there may be occasional mistakes despite careful  proofreading.  ?    Melynda Ripple, MD 03/14/15 1555

## 2015-03-14 NOTE — ED Notes (Signed)
Iv charted on wrong pt

## 2015-03-14 NOTE — Discharge Instructions (Signed)
Go to the [facility] ER for comprehensive evaluation and imaging. We need to make sure that the blood clot has not extended up past your knee.

## 2015-03-14 NOTE — ED Provider Notes (Signed)
Time Seen: Approximately 1730  I have reviewed the triage notes  Chief Complaint: Leg Swelling and Flank Pain   History of Present Illness: Michael Gill is a 54 y.o. male who was referred here from the walk-in clinic and has twofold complaints. He's had already a venous Doppler study of his right lower extremity showed a deep venous thrombosis. Patient's already on Xarelto on an outpatient basis. DVT appears stable and denies any shortness of breath or chest pain. His secondary complaint was some right-sided abdominal pain which is what he was mainly referred here fo for further evaluation. Patient states he right-sided pain started approximately 2-3 weeks ago. States the pain is relatively constant nannies had a history of kidney stone he states he feels that this may be the same type of discomfort. He denies any fever at home. He denies any dysuria, hematuria, urinary frequency. Pain radiates all the way up and down his right side toward his right groin area. He denies any testicular pain or masses. He denies any left-sided abdominal or flank pain. He denies any trauma. Denies any loose stool or diarrhea.   Past Medical History  Diagnosis Date  . Pseudoseizure   . Diabetes mellitus   . Hyperlipidemia   . CVA (cerebral infarction)   . Colon cancer   . Sleep apnea   . B12 deficiency   . CAD (coronary artery disease)     s/p MI  . DVT (deep venous thrombosis)   . Neuropathic pain, leg     Patient Active Problem List   Diagnosis Date Noted  . TIA (transient ischemic attack) 01/19/2014    Past Surgical History  Procedure Laterality Date  . Appendectomy    . Tonsillectomy      Past Surgical History  Procedure Laterality Date  . Appendectomy    . Tonsillectomy      Current Outpatient Rx  Name  Route  Sig  Dispense  Refill  . atorvastatin (LIPITOR) 40 MG tablet   Oral   Take 40 mg by mouth daily.         . hydrOXYzine (ATARAX/VISTARIL) 25 MG tablet   Oral   Take 1  tablet (25 mg total) by mouth every 6 (six) hours as needed for itching. Patient taking differently: Take 20 mg by mouth every 6 (six) hours as needed for itching.    20 tablet   0   . ibuprofen (ADVIL,MOTRIN) 200 MG tablet   Oral   Take 200 mg by mouth every 6 (six) hours as needed for moderate pain.         . Insulin Lispro Prot & Lispro (HUMALOG MIX 75/25 KWIKPEN) (75-25) 100 UNIT/ML Kwikpen   Subcutaneous   Inject 45 Units into the skin daily.          . rivaroxaban (XARELTO) 20 MG TABS tablet   Oral   Take 20 mg by mouth daily with supper.         . traZODone (DESYREL) 50 MG tablet   Oral   Take 50-100 mg by mouth at bedtime.         . famotidine (PEPCID) 20 MG tablet   Oral   Take 1 tablet (20 mg total) by mouth 2 (two) times daily. Patient not taking: Reported on 03/14/2015   8 tablet   0   . HYDROmorphone (DILAUDID) 2 MG tablet   Oral   Take 1 tablet (2 mg total) by mouth every 12 (twelve) hours as needed  for severe pain.   20 tablet   0   . ondansetron (ZOFRAN) 4 MG tablet   Oral   Take 1 tablet (4 mg total) by mouth every 8 (eight) hours as needed for nausea or vomiting.   12 tablet   1   . predniSONE (DELTASONE) 20 MG tablet      2 tablets daily x 4 days Patient not taking: Reported on 03/14/2015   8 tablet   0     Allergies:  Codeine; Shellfish allergy; and Sulfa antibiotics  Family History: Family History  Problem Relation Age of Onset  . Cancer Brother   . Alzheimer's disease Mother     Social History: Social History  Substance Use Topics  . Smoking status: Never Smoker   . Smokeless tobacco: None  . Alcohol Use: No     Review of Systems:   10 point review of systems was performed and was otherwise negative:  Constitutional: No fever Eyes: No visual disturbances ENT: No sore throat, ear pain Cardiac: No chest pain Respiratory: No shortness of breath, wheezing, or stridor Abdomen: Crampy diffuse right-sided mainly toward  her right flank and right lower back toward her right groin. Endocrine: No weight loss, No night sweats Extremities: No peripheral edema, cyanosis Skin: No rashes, easy bruising Neurologic: No focal weakness, trouble with speech or swollowing Urologic: No dysuria, Hematuria, or urinary frequency   Physical Exam:  ED Triage Vitals  Enc Vitals Group     BP 03/14/15 1641 120/84 mmHg     Pulse Rate 03/14/15 1641 92     Resp 03/14/15 1641 16     Temp 03/14/15 1641 97.6 F (36.4 C)     Temp Source 03/14/15 1641 Oral     SpO2 03/14/15 1641 95 %     Weight 03/14/15 1641 239 lb (108.41 kg)     Height 03/14/15 1641 5\' 8"  (1.727 m)     Head Cir --      Peak Flow --      Pain Score 03/14/15 1641 8     Pain Loc --      Pain Edu? --      Excl. in [place]? --     General: Awake , Alert , and Oriented times 3; GCS 15 Head: Normal cephalic , atraumatic Eyes: Pupils equal , round, reactive to light Nose/Throat: No nasal drainage, patent upper airway without erythema or exudate.  Neck: Supple, Full range of motion, No anterior adenopathy or palpable thyroid masses Lungs: Clear to ascultation without wheezes , rhonchi, or rales Heart: Regular rate, regular rhythm without murmurs , gallops , or rubs Abdomen: Soft, non tender without rebound, guarding , or rigidity; bowel sounds positive and symmetric in all 4 quadrants. No organomegaly .        Extremities: 2 plus symmetric pulses. No edema, clubbing or cyanosis Neurologic: normal ambulation, Motor symmetric without deficits, sensory intact Skin: warm, dry, no rashes   Labs:   All laboratory work was reviewed including any pertinent negatives or positives listed below:  Labs Reviewed  URINALYSIS COMPLETEWITH MICROSCOPIC ([facility] ONLY) - Abnormal; Notable for the following:    Color, Urine YELLOW (*)    APPearance CLEAR (*)    Glucose, UA 150 (*)    Ketones, ur TRACE (*)    Protein, ur 30 (*)    All other components within normal limits   PROTIME-INR - Abnormal; Notable for the following:    Prothrombin Time 21.5 (*)  All other components within normal limits  COMPREHENSIVE METABOLIC PANEL - Abnormal; Notable for the following:    Glucose, Bld 164 (*)    All other components within normal limits  APTT  CBC   laboratory work reviewed and does not show any significant findings  EKG: None   CT RENAL STONE STUDY (Final result) Result time: 03/14/15 21:20:55   Final result by Rad Results In Interface (03/14/15 21:20:55)   Narrative:   CLINICAL DATA: Right flank pain for 2 days. Personal history of appendectomy.  EXAM: CT ABDOMEN AND PELVIS WITHOUT CONTRAST  TECHNIQUE: Multidetector CT imaging of the abdomen and pelvis was performed following the standard protocol without IV contrast.  COMPARISON: CT abdomen pelvis 01/15/2013.  FINDINGS: The lung bases are clear without focal nodule, mass, or airspace disease. The heart size is normal. No significant pleural or pericardial effusion is present.  The liver and spleen are within normal limits. The stomach is within normal limits. The duodenum and pancreas are unremarkable. The common bile duct and gallbladder are within normal limits. The adrenal glands are normal bilaterally. There is no nephrolithiasis. The right kidney is mildly atrophic compared to the left. The ureters are within normal limits bilaterally. Urinary bladder is within normal limits.  The rectosigmoid colon is unremarkable. The more proximal colon is within normal limits. The appendix is surgically absent. The small bowel is within normal limits. No significant adenopathy or free fluid is present. A paraumbilical hernia contains fat but no bowel.  The bone windows demonstrate no focal lytic or blastic lesions. The calcified disc is present at L4-5. Moderate central and bilateral foraminal stenosis is present.  IMPRESSION: 1. No evidence for nephrolithiasis or urinary tract  obstruction. 2. Mild atrophy of the right kidney. 3. No acute or focal lesion to explain the patient's symptoms.   Electronically Signed By: San Morelle M.D. On: 03/14/2015 21:20    Radiology:  I personally reviewed the radiologic studies   Procedures: None   Critical Care: None    ED Course: Differential diagnosis includes but is not exclusive to acute appendicitis, renal colic, testicular torsion, urinary tract infection, prostatitis,  diverticulitis, small bowel obstruction, colitis, abdominal aortic aneurysm, gastroenteritis, etc.   Patient primarily has some right flank pain and close examination shows some upper quadrant pain but I cannot find any obvious etiologies at this point. He may have some gastric irritation from being on the bed tender and advised him to take an over-the-counter antacid medication and prescribed him some general pain medication on an outpatient basis.  Assessment: Known DVT in the right lower extremity Acute right-sided abdominal pain of unknown etiology.   Final Clinical Impression: Acute right flank and abdominal pain uncertain etiology Final diagnoses:  Acute right flank pain     Plan:Patient was advised to return immediately if condition worsens. Patient was advised to follow up with her primary care physician or other specialized physicians involved and in their current assessment.            Daymon Larsen, MD 03/14/15 [phone]

## 2015-03-14 NOTE — ED Notes (Signed)
Patient states that he has a blood clot in his right leg.  Patient reports increase pain in his right leg, right side of his abdomen and back.  Patient reports nausea.

## 2015-03-14 NOTE — ED Notes (Signed)
Pt reports right leg pain for a few weeks, reports more swollen then normal. Pt reports numbness, tingling and burning to right leg (hx of neuropathy). Pt reports he is on xarelto for DVT behind right knee (dx in April). Pt also reports right flank pain, pt denies any urinary symptoms.

## 2015-04-21 ENCOUNTER — Encounter: Payer: Self-pay | Admitting: *Deleted

## 2015-04-21 ENCOUNTER — Emergency Department: Payer: Medicare HMO

## 2015-04-21 ENCOUNTER — Emergency Department
Admission: EM | Admit: 2015-04-21 | Discharge: 2015-04-21 | Disposition: A | Discharge status: Home / Self Care | Payer: Medicare HMO | Attending: Emergency Medicine | Admitting: Emergency Medicine

## 2015-04-21 DIAGNOSIS — Z7901 Long term (current) use of anticoagulants: Secondary | ICD-10-CM | POA: Diagnosis not present

## 2015-04-21 DIAGNOSIS — R569 Unspecified convulsions: Secondary | ICD-10-CM | POA: Diagnosis present

## 2015-04-21 DIAGNOSIS — Z794 Long term (current) use of insulin: Secondary | ICD-10-CM | POA: Insufficient documentation

## 2015-04-21 DIAGNOSIS — R51 Headache: Secondary | ICD-10-CM | POA: Insufficient documentation

## 2015-04-21 DIAGNOSIS — E11649 Type 2 diabetes mellitus with hypoglycemia without coma: Secondary | ICD-10-CM | POA: Diagnosis not present

## 2015-04-21 DIAGNOSIS — Z79899 Other long term (current) drug therapy: Secondary | ICD-10-CM | POA: Diagnosis not present

## 2015-04-21 DIAGNOSIS — E162 Hypoglycemia, unspecified: Secondary | ICD-10-CM

## 2015-04-21 DIAGNOSIS — F445 Conversion disorder with seizures or convulsions: Secondary | ICD-10-CM

## 2015-04-21 LAB — URINALYSIS COMPLETE WITH MICROSCOPIC (ARMC ONLY)
BACTERIA UA: NONE SEEN
BILIRUBIN URINE: NEGATIVE
Glucose, UA: NEGATIVE mg/dL
HGB URINE DIPSTICK: NEGATIVE
KETONES UR: NEGATIVE mg/dL
LEUKOCYTES UA: NEGATIVE
NITRITE: NEGATIVE
PH: 7 (ref 5.0–8.0)
Protein, ur: NEGATIVE mg/dL
RBC / HPF: NONE SEEN RBC/hpf (ref 0–5)
SPECIFIC GRAVITY, URINE: 1.002 — AB (ref 1.005–1.030)
Squamous Epithelial / LPF: NONE SEEN
WBC, UA: NONE SEEN WBC/hpf (ref 0–5)

## 2015-04-21 LAB — CBC
HEMATOCRIT: 43.5 % (ref 40.0–52.0)
Hemoglobin: 15 g/dL (ref 13.0–18.0)
MCH: 31 pg (ref 26.0–34.0)
MCHC: 34.5 g/dL (ref 32.0–36.0)
MCV: 90 fL (ref 80.0–100.0)
Platelets: 179 10*3/uL (ref 150–440)
RBC: 4.83 MIL/uL (ref 4.40–5.90)
RDW: 14.3 % (ref 11.5–14.5)
WBC: 8.8 10*3/uL (ref 3.8–10.6)

## 2015-04-21 LAB — BASIC METABOLIC PANEL
ANION GAP: 6 (ref 5–15)
BUN: 13 mg/dL (ref 6–20)
CALCIUM: 9.2 mg/dL (ref 8.9–10.3)
CO2: 27 mmol/L (ref 22–32)
Chloride: 107 mmol/L (ref 101–111)
Creatinine, Ser: 1.04 mg/dL (ref 0.61–1.24)
Glucose, Bld: 128 mg/dL — ABNORMAL HIGH (ref 65–99)
Potassium: 4 mmol/L (ref 3.5–5.1)
SODIUM: 140 mmol/L (ref 135–145)

## 2015-04-21 NOTE — ED Provider Notes (Signed)
[facility] Emergency Department Provider Note  ____________________________________________  Time seen: Approximately 7:08 PM  I have reviewed the triage vital signs and the nursing notes.   HISTORY  Chief Complaint Seizures  History is limited due to patient altered mental status, but his wife who witnessed the entire episode is here and describes everything in detail.  HPI CADAN MAGGART is a 54 y.o. male with a history of DM on insulin,frequent pseudoseizures, CAD and CVA, presenting for headache, hypoglycemia, and pseudoseizure. Wife states that they were a Art therapist today when the patient developed a mild headache and sweatiness, blood sugar was checked and it was in the 50s. He received a cookie, please of candy, and 2 Coca-Colas. He then was escorted to the restroom by a Engineer, structural, and when he returned he began to have a pseudoseizure which is described as aggressive flailing, kicking and agitation. Per wife, pseudoseizure was more aggressive than his typical pseudoseizure. He had associated slurred speech which is also considered typical for her pseudoseizures. EMS was called, patient blood sugar was in the 120s, and he was given 1 mg of Versed for his agitation. His wife denies any recent illness, changes in insulin dosage, changes in meals, nausea, vomiting, diarrhea, chest pain, abdominal pain, headaches.  At this time, the patient is able to respond with thumbs-up and thumbs down but is unable to give verbal answers.   Past Medical History  Diagnosis Date  . Pseudoseizure   . Diabetes mellitus   . Hyperlipidemia   . CVA (cerebral infarction)   . Colon cancer   . Sleep apnea   . B12 deficiency   . CAD (coronary artery disease)     s/p MI  . DVT (deep venous thrombosis)   . Neuropathic pain, leg     Patient Active Problem List   Diagnosis Date Noted  . TIA (transient ischemic attack) 01/19/2014    Past Surgical History  Procedure  Laterality Date  . Appendectomy    . Tonsillectomy      Current Outpatient Rx  Name  Route  Sig  Dispense  Refill  . atorvastatin (LIPITOR) 40 MG tablet   Oral   Take 40 mg by mouth daily.         . famotidine (PEPCID) 20 MG tablet   Oral   Take 1 tablet (20 mg total) by mouth 2 (two) times daily. Patient not taking: Reported on 03/14/2015   8 tablet   0   . HYDROmorphone (DILAUDID) 2 MG tablet   Oral   Take 1 tablet (2 mg total) by mouth every 12 (twelve) hours as needed for severe pain.   20 tablet   0   . hydrOXYzine (ATARAX/VISTARIL) 25 MG tablet   Oral   Take 1 tablet (25 mg total) by mouth every 6 (six) hours as needed for itching. Patient taking differently: Take 20 mg by mouth every 6 (six) hours as needed for itching.    20 tablet   0   . ibuprofen (ADVIL,MOTRIN) 200 MG tablet   Oral   Take 200 mg by mouth every 6 (six) hours as needed for moderate pain.         . Insulin Lispro Prot & Lispro (HUMALOG MIX 75/25 KWIKPEN) (75-25) 100 UNIT/ML Kwikpen   Subcutaneous   Inject 45 Units into the skin daily.          . ondansetron (ZOFRAN) 4 MG tablet   Oral   Take 1 tablet (  4 mg total) by mouth every 8 (eight) hours as needed for nausea or vomiting.   12 tablet   1   . predniSONE (DELTASONE) 20 MG tablet      2 tablets daily x 4 days Patient not taking: Reported on 03/14/2015   8 tablet   0   . rivaroxaban (XARELTO) 20 MG TABS tablet   Oral   Take 20 mg by mouth daily with supper.         . traZODone (DESYREL) 50 MG tablet   Oral   Take 50-100 mg by mouth at bedtime.           Allergies Codeine; Shellfish allergy; and Sulfa antibiotics  Family History  Problem Relation Age of Onset  . Cancer Brother   . Alzheimer's disease Mother     Social History Social History  Substance Use Topics  . Smoking status: Never Smoker   . Smokeless tobacco: None  . Alcohol Use: No    Review of Systems Limited due to altered mental  status  ____________________________________________   PHYSICAL EXAM:  VITAL SIGNS: ED Triage Vitals  Enc Vitals Group     BP 04/21/15 1844 123/87 mmHg     Pulse Rate 04/21/15 1844 93     Resp 04/21/15 1844 16     Temp 04/21/15 1844 98.1 F (36.7 C)     Temp Source 04/21/15 1844 Oral     SpO2 04/21/15 1844 95 %     Weight --      Height --      Head Cir --      Peak Flow --      Pain Score --      Pain Loc --      Pain Edu? --      Excl. in [place]? --     Constitutional: Patient is alerted and repeating "SHU SHU." He is lying in the stretcher with his eyes closed. He is able to answer questions by giving thumbs up or thumbs down. Eyes: Conjunctivae are normal.  EOMI. PERRLA. Patient has forcibly closed eyes and fights against me when I try to open his eyelids. Head: Atraumatic. No raccoon sign or Battle sign. Nose: No congestion/rhinnorhea. Mouth/Throat: Mucous membranes are moist.  Neck: No stridor.  Supple. Full range of motion without pain. Cardiovascular: Normal rate, regular rhythm. No murmurs, rubs or gallops.  Respiratory: Normal respiratory effort.  No retractions. Lungs CTAB.  No wheezes, rales or ronchi. Gastrointestinal: Soft and nontender. No distention. No peritoneal signs. Obese. Musculoskeletal: No LE edema. Moves all extremities well. Neurologic:  Alert with repetitive garbled speech. Moves all extremities well. Is unable to comply with full neurologic exam.  Skin:  Skin is warm, dry and intact. No rash noted.   ____________________________________________   LABS (all labs ordered are listed, but only abnormal results are displayed)  Labs Reviewed  BASIC METABOLIC PANEL - Abnormal; Notable for the following:    Glucose, Bld 128 (*)    All other components within normal limits  URINALYSIS COMPLETEWITH MICROSCOPIC ([facility] ONLY) - Abnormal; Notable for the following:    Color, Urine COLORLESS (*)    APPearance CLEAR (*)    Specific Gravity, Urine 1.002 (*)     All other components within normal limits  CBC   ____________________________________________  EKG  ED ECG REPORT ED ECG REPORT I, Eula Listen, the attending physician, personally viewed and interpreted this ECG.  Date: 04/21/2015 EKG Time: 1859 Rate: 88 Rhythm: normal sinus  rhythm QRS Axis: normal Intervals: normal ST/T Wave abnormalities: normal Conduction Disutrbances: none Narrative Interpretation: unremarkable  ____________________________________________  RADIOLOGY  Ct Head Wo Contrast  04/21/2015   CLINICAL DATA:  Slurred speech, left-sided facial droop.  EXAM: CT HEAD WITHOUT CONTRAST  TECHNIQUE: Contiguous axial images were obtained from the base of the skull through the vertex without intravenous contrast.  COMPARISON:  CT scan February 11, 2015.  FINDINGS: Bony calvarium appears intact. No mass effect or midline shift is noted. Ventricular size is within normal limits. There is no evidence of mass lesion, hemorrhage or acute infarction.  IMPRESSION: Normal head CT.   Electronically Signed   By: Marijo Conception, M.D.   On: 04/21/2015 19:31    ____________________________________________   PROCEDURES  Procedure(s) performed: None  Critical Care performed: No ____________________________________________   INITIAL IMPRESSION / ASSESSMENT AND PLAN / ED COURSE  Pertinent labs & imaging results that were available during my care of the patient were reviewed by me and considered in my medical decision making (see chart for details).  54 y.o. male with history of pseudoseizures and CVA, DM, presenting with episode of hypoglycemia followed by typical pseudoseizure. However, the patient does have history of CVA and has not regained normal mental status. I will get a CT scan to rule out any acute intercranial process. Labs will be obtained to reevaluate his blood sugar and other electrolytes. He will have serial point-of-care glucose testing until he has 3  normals.  Patient's wife reports that he usually improves after pseudoseizures by having people pray for him if it is posted on Facebook that he had a pseudoseizure. She has also called the church.  Plan reevaluation.  ----------------------------------------- 8:31 PM on 04/21/2015 -----------------------------------------  Patient is feeling much better. He is now answering questions, states he is feeling fine. His wife states he is back to his baseline. Repeat blood sugar is normal. Will plan to continue to monitor blood sugars, by mouth challenge, ambulate and if patient is clear from all of these things we will discharge him home.  ----------------------------------------- 9:42 PM on 04/21/2015 -----------------------------------------  The patient is back to his normal mental status. He has had 3 hours of normal blood sugars and is tolerating by mouth. We'll plan to discharge him home with follow-up with his PMD area and her symptoms return ____________________________________________  FINAL CLINICAL IMPRESSION(S) / ED DIAGNOSES  Final diagnoses:  Hypoglycemia  Pseudoseizure      NEW MEDICATIONS STARTED DURING THIS VISIT:  New Prescriptions   No medications on file     Eula Listen, MD 04/21/15 2143

## 2015-04-21 NOTE — ED Notes (Signed)
BG 84

## 2015-04-21 NOTE — ED Notes (Signed)
BG-113

## 2015-04-21 NOTE — ED Notes (Signed)
BG 158 after eating. Pt remains alert and oriented x 4. Wife at bedside. Reports he is feeling much better now.

## 2015-04-21 NOTE — ED Notes (Signed)
Pt now awake, alert and oriented x 4. Talking normally, no facial droop. Reports headache and feeling hungry. MD to bedside and states he can eat. We will recheck BG in 1 more hour to verify 3 normal BG levels. Will get pt something to eat.

## 2015-04-21 NOTE — ED Notes (Signed)
Pt given food and drink. Sat up in bed eating at this time. Wife at bedside. No further needs.

## 2015-04-21 NOTE — Discharge Instructions (Signed)
Please drink plenty of fluids to stay well hydrated. Please continue to to eat small, regular meals throughout the day to prevent low blood sugar.  Please return to the emergency department if you develop any low blood sugars, fainting, fevers, pain, or any other symptoms concerning to you.

## 2015-04-21 NOTE — ED Notes (Signed)
Per EMS pt picked up from [employer], pt had pseudoseizure. Hx of same. Wife states has pseudoseizures when he is in severe pain. Pt given 1 mg versed by EMS due to agitation, kicking, thrashing. VSS. BS WNL. Pt with slurred speech on arrival and left facial droop. Wife states this is typical with his seizures.

## 2015-04-21 NOTE — ED Notes (Signed)
Patient transported to CT 

## 2015-04-22 LAB — GLUCOSE, CAPILLARY
GLUCOSE-CAPILLARY: 84 mg/dL (ref 65–99)
GLUCOSE-CAPILLARY: 158 mg/dL — AB (ref 65–99)
Glucose-Capillary: 113 mg/dL — ABNORMAL HIGH (ref 65–99)

## 2015-06-09 ENCOUNTER — Encounter: Payer: Self-pay | Admitting: Emergency Medicine

## 2015-06-09 ENCOUNTER — Emergency Department: Payer: Medicare HMO

## 2015-06-09 ENCOUNTER — Emergency Department
Admission: EM | Admit: 2015-06-09 | Discharge: 2015-06-09 | Disposition: A | Discharge status: Home / Self Care | Payer: Medicare HMO | Attending: Emergency Medicine | Admitting: Emergency Medicine

## 2015-06-09 DIAGNOSIS — K859 Acute pancreatitis without necrosis or infection, unspecified: Secondary | ICD-10-CM | POA: Diagnosis not present

## 2015-06-09 DIAGNOSIS — Z79899 Other long term (current) drug therapy: Secondary | ICD-10-CM | POA: Diagnosis not present

## 2015-06-09 DIAGNOSIS — Z794 Long term (current) use of insulin: Secondary | ICD-10-CM | POA: Insufficient documentation

## 2015-06-09 DIAGNOSIS — Z7901 Long term (current) use of anticoagulants: Secondary | ICD-10-CM | POA: Diagnosis not present

## 2015-06-09 DIAGNOSIS — E1165 Type 2 diabetes mellitus with hyperglycemia: Secondary | ICD-10-CM | POA: Diagnosis not present

## 2015-06-09 DIAGNOSIS — M6281 Muscle weakness (generalized): Secondary | ICD-10-CM | POA: Diagnosis not present

## 2015-06-09 DIAGNOSIS — R51 Headache: Secondary | ICD-10-CM | POA: Insufficient documentation

## 2015-06-09 DIAGNOSIS — Z7952 Long term (current) use of systemic steroids: Secondary | ICD-10-CM | POA: Diagnosis not present

## 2015-06-09 DIAGNOSIS — R7309 Other abnormal glucose: Secondary | ICD-10-CM | POA: Diagnosis present

## 2015-06-09 LAB — CBC
HCT: 44.8 % (ref 40.0–52.0)
HEMOGLOBIN: 15.2 g/dL (ref 13.0–18.0)
MCH: 31 pg (ref 26.0–34.0)
MCHC: 33.9 g/dL (ref 32.0–36.0)
MCV: 91.5 fL (ref 80.0–100.0)
Platelets: 203 10*3/uL (ref 150–440)
RBC: 4.9 MIL/uL (ref 4.40–5.90)
RDW: 13.4 % (ref 11.5–14.5)
WBC: 6.9 10*3/uL (ref 3.8–10.6)

## 2015-06-09 LAB — LIPASE, BLOOD: LIPASE: 94 U/L — AB (ref 11–51)

## 2015-06-09 LAB — BASIC METABOLIC PANEL
ANION GAP: 4 — AB (ref 5–15)
BUN: 14 mg/dL (ref 6–20)
CALCIUM: 9.2 mg/dL (ref 8.9–10.3)
CO2: 28 mmol/L (ref 22–32)
Chloride: 105 mmol/L (ref 101–111)
Creatinine, Ser: 1.02 mg/dL (ref 0.61–1.24)
GLUCOSE: 192 mg/dL — AB (ref 65–99)
POTASSIUM: 4.3 mmol/L (ref 3.5–5.1)
SODIUM: 137 mmol/L (ref 135–145)

## 2015-06-09 LAB — URINALYSIS COMPLETE WITH MICROSCOPIC (ARMC ONLY)
BACTERIA UA: NONE SEEN
Bilirubin Urine: NEGATIVE
Glucose, UA: 50 mg/dL — AB
Hgb urine dipstick: NEGATIVE
Ketones, ur: NEGATIVE mg/dL
LEUKOCYTES UA: NEGATIVE
Nitrite: NEGATIVE
PH: 7 (ref 5.0–8.0)
PROTEIN: NEGATIVE mg/dL
SQUAMOUS EPITHELIAL / LPF: NONE SEEN
Specific Gravity, Urine: 1.009 (ref 1.005–1.030)

## 2015-06-09 LAB — TROPONIN I

## 2015-06-09 LAB — GLUCOSE, CAPILLARY: Glucose-Capillary: 194 mg/dL — ABNORMAL HIGH (ref 65–99)

## 2015-06-09 MED ORDER — LORAZEPAM 2 MG/ML IJ SOLN
INTRAMUSCULAR | Status: AC
  Administered 2015-06-09: 1 mg via INTRAVENOUS
  Filled 2015-06-09: qty 1

## 2015-06-09 MED ORDER — RIVAROXABAN 15 MG PO TABS
ORAL_TABLET | ORAL | Status: AC
  Administered 2015-06-09: 20 mg via ORAL
  Filled 2015-06-09: qty 2

## 2015-06-09 MED ORDER — OXYCODONE-ACETAMINOPHEN 5-325 MG PO TABS: 1.0000 | ORAL_TABLET | Freq: Four times a day (QID) | ORAL | Status: DC | PRN

## 2015-06-09 MED ORDER — SODIUM CHLORIDE 0.9 % IV BOLUS (SEPSIS)
1000.0000 mL | Freq: Once | INTRAVENOUS | Status: AC
  Administered 2015-06-09: 1000 mL via INTRAVENOUS

## 2015-06-09 MED ORDER — RIVAROXABAN 20 MG PO TABS
20.0000 mg | ORAL_TABLET | Freq: Once | ORAL | Status: AC
  Administered 2015-06-09: 20 mg via ORAL
  Filled 2015-06-09: qty 1

## 2015-06-09 MED ORDER — LORAZEPAM 2 MG/ML IJ SOLN
1.0000 mg | Freq: Once | INTRAMUSCULAR | Status: AC
  Administered 2015-06-09: 1 mg via INTRAVENOUS

## 2015-06-09 NOTE — ED Notes (Signed)
Pt reports blood glucose issues for two weeks. Pt states his sugar was 212 at home today. Pt states has been nauseated and does not feel well.

## 2015-06-09 NOTE — Discharge Instructions (Signed)

## 2015-06-09 NOTE — ED Notes (Signed)
Pt reports hyperglycemia today with the highest blood,sugar 212 complains of lower left abdominal pain" over pancreas" per pt

## 2015-06-09 NOTE — ED Notes (Signed)
CBG 194 in triage.

## 2015-06-09 NOTE — ED Provider Notes (Signed)
[facility] Emergency Department Provider Note  Time seen: 6:08 PM  I have reviewed the triage vital signs and the nursing notes.   HISTORY  Chief Complaint Blood Sugar Problem    HPI Michael Gill is a 54 y.o. male with a past medical history of diabetes, CVA, pseudoseizures, who presents the emergency department for an elevated blood glucose. According to the patient for the past 3-4 days he has had an intermittently elevated blood glucose which she states is somewhat abnormal for him. He has been dosing his insulin as prescribed. He states today he has felt somewhat weak with intermittent dizziness, has a mild headache feels like he is having "pancreas pain." Patient states he has been nauseated for the past 2 days with occasional vomiting. Describes his abdominal pain is mild, dizziness as mild, headache is moderate. Denies fever, diarrhea, dysuria, focal weakness or numbness.    Past Medical History  Diagnosis Date  . 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 Diabetes mellitus ([place])   . Hyperlipidemia   . CVA (cerebral infarction)   . Colon cancer ([place])   . Sleep apnea   . B12 deficiency   . CAD (coronary artery disease)     s/p MI  . DVT (deep venous thrombosis) ([place])   . Neuropathic pain, leg     Patient Active Problem List   Diagnosis Date Noted  . TIA (transient ischemic attack) 01/19/2014    Past Surgical History  Procedure Laterality Date  . Appendectomy    . Tonsillectomy      Current Outpatient Rx  Name  Route  Sig  Dispense  Refill  . atorvastatin (LIPITOR) 40 MG tablet   Oral   Take 40 mg by mouth daily.         . famotidine (PEPCID) 20 MG tablet   Oral   Take 1 tablet (20 mg total) by mouth 2 (two) times daily. Patient not taking: Reported on 03/14/2015   8 tablet   0   . HYDROmorphone (DILAUDID) 2 MG tablet   Oral   Take 1 tablet (2 mg total) by mouth every 12 (twelve) hours as needed for severe pain.   20 tablet   0   .  hydrOXYzine (ATARAX/VISTARIL) 25 MG tablet   Oral   Take 1 tablet (25 mg total) by mouth every 6 (six) hours as needed for itching. Patient taking differently: Take 20 mg by mouth every 6 (six) hours as needed for itching.    20 tablet   0   . ibuprofen (ADVIL,MOTRIN) 200 MG tablet   Oral   Take 200 mg by mouth every 6 (six) hours as needed for moderate pain.         . Insulin Lispro Prot & Lispro (HUMALOG MIX 75/25 KWIKPEN) (75-25) 100 UNIT/ML Kwikpen   Subcutaneous   Inject 45 Units into the skin daily.          . ondansetron (ZOFRAN) 4 MG tablet   Oral   Take 1 tablet (4 mg total) by mouth every 8 (eight) hours as needed for nausea or vomiting.   12 tablet   1   . predniSONE (DELTASONE) 20 MG tablet      2 tablets daily x 4 days Patient not taking: Reported on 03/14/2015   8 tablet   0   . rivaroxaban (XARELTO) 20 MG TABS tablet   Oral   Take 20 mg by mouth daily with supper.         Marland Kitchen  traZODone (DESYREL) 50 MG tablet   Oral   Take 50-100 mg by mouth at bedtime.           Allergies Codeine; Shellfish allergy; and Sulfa antibiotics  Family History  Problem Relation Age of Onset  . Cancer Brother   . Alzheimer's disease Mother     Social History Social History  Substance Use Topics  . Smoking status: Never Smoker   . Smokeless tobacco: None  . Alcohol Use: No    Review of Systems Constitutional: Negative for fever Cardiovascular: Negative for chest pain. Respiratory: Negative for shortness of breath. Gastrointestinal: Mild mid abdominal pain. Nausea and vomiting. Negative for diarrhea. Genitourinary: Negative for dysuria. Musculoskeletal: Negative for back pain. Neurological: Outer headache. Denies focal weakness or numbness. 10-point ROS otherwise negative.  ____________________________________________   PHYSICAL EXAM:  VITAL SIGNS: ED Triage Vitals  Enc Vitals Group     BP 06/09/15 1618 127/85 mmHg     Pulse Rate 06/09/15 1618 87      Resp 06/09/15 1618 18     Temp 06/09/15 1618 98.1 F (36.7 C)     Temp src --      SpO2 06/09/15 1618 95 %     Weight 06/09/15 1618 250 lb (113.399 kg)     Height 06/09/15 1618 5\' 8"  (1.727 m)     Head Cir --      Peak Flow --      Pain Score --      Pain Loc --      Pain Edu? --      Excl. in [place]? --     Constitutional: Alert and oriented. Anxious appearing. Eyes: Normal exam ENT   Head: Normocephalic and atraumatic.   Mouth/Throat: Mucous membranes are moist. Cardiovascular: Normal rate, regular rhythm. No murmur Respiratory: Normal respiratory effort without tachypnea nor retractions. Breath sounds are clear Gastrointestinal: Soft, mild periumbilical tenderness palpation. No rebound or guarding. No distention. Musculoskeletal: Nontender with normal range of motion in all extremities.  Neurologic:  Normal speech and language. No gross focal neurologic deficits  Skin:  Skin is warm, dry and intact.  Psychiatric: Patient is quite anxious appearing.  ____________________________________________   RADIOLOGY  CT head shows no acute abnormality  ____________________________________________    INITIAL IMPRESSION / ASSESSMENT AND PLAN / ED COURSE  Pertinent labs & imaging results that were available during my care of the patient were reviewed by me and considered in my medical decision making (see chart for details).  She presents the emergency department with elevated blood glucose and vague symptoms of dizziness, headache, generalized weakness, intermittent abdominal pain. Patient is very anxious on exam. He has a log of his blood glucose readings, it appears he has taken his blood sugar level approximately 10-15 times today, dosing insulin several times as well. Patient's current finger stick was 196 in triage. Overall the patient appears anxious, but in otherwise well exam. Equal grip strengths. Mild periumbilical tenderness palpation, but no rebound or guarding. No  reaction to palpation. We will check labs including troponin and lipase, urinalysis, and obtain a CT head given his headache and dizziness symptoms with a history of CVA in the past. No focal neurologic deficits identified.   CT head shows no acute abnormality. Lipase is slightly elevated. I discussed with the patient likely pancreatitis the need to limit his diet to a liquid diet for 48 hours then slowly increase his diet. We'll discharge her pain medication. The patient is follow up with his  primary care doctor in 1-2 days for recheck. I discussed my normal abdominal pain return precautions with the patient who is agreeable. ____________________________________________   FINAL CLINICAL IMPRESSION(S) / ED DIAGNOSES  Generalized weakness Hyperglycemia Abdominal pain Pancreatitis  Harvest Dark, MD 06/09/15 2104

## 2015-09-27 ENCOUNTER — Ambulatory Visit: Admission: RE | Admit: 2015-09-27 | Payer: Medicare HMO | Source: Ambulatory Visit | Admitting: Internal Medicine

## 2015-09-27 ENCOUNTER — Encounter: Admission: RE | Payer: Self-pay | Source: Ambulatory Visit

## 2015-09-27 SURGERY — LEFT HEART CATH AND CORONARY ANGIOGRAPHY
Anesthesia: Moderate Sedation

## 2015-10-08 ENCOUNTER — Emergency Department
Admission: EM | Admit: 2015-10-08 | Discharge: 2015-10-08 | Disposition: A | Discharge status: Home / Self Care | Payer: Medicare HMO | Attending: Emergency Medicine | Admitting: Emergency Medicine

## 2015-10-08 ENCOUNTER — Emergency Department: Payer: Medicare HMO

## 2015-10-08 ENCOUNTER — Encounter: Payer: Self-pay | Admitting: Emergency Medicine

## 2015-10-08 DIAGNOSIS — I2510 Atherosclerotic heart disease of native coronary artery without angina pectoris: Secondary | ICD-10-CM | POA: Insufficient documentation

## 2015-10-08 DIAGNOSIS — Z79899 Other long term (current) drug therapy: Secondary | ICD-10-CM | POA: Diagnosis not present

## 2015-10-08 DIAGNOSIS — M79605 Pain in left leg: Secondary | ICD-10-CM | POA: Diagnosis not present

## 2015-10-08 DIAGNOSIS — R079 Chest pain, unspecified: Secondary | ICD-10-CM | POA: Insufficient documentation

## 2015-10-08 DIAGNOSIS — E119 Type 2 diabetes mellitus without complications: Secondary | ICD-10-CM | POA: Diagnosis not present

## 2015-10-08 DIAGNOSIS — R200 Anesthesia of skin: Secondary | ICD-10-CM | POA: Insufficient documentation

## 2015-10-08 DIAGNOSIS — R0602 Shortness of breath: Secondary | ICD-10-CM | POA: Diagnosis not present

## 2015-10-08 DIAGNOSIS — Z794 Long term (current) use of insulin: Secondary | ICD-10-CM | POA: Diagnosis not present

## 2015-10-08 LAB — BASIC METABOLIC PANEL
ANION GAP: 13 (ref 5–15)
BUN: 16 mg/dL (ref 6–20)
CO2: 18 mmol/L — ABNORMAL LOW (ref 22–32)
Calcium: 9.3 mg/dL (ref 8.9–10.3)
Chloride: 105 mmol/L (ref 101–111)
Creatinine, Ser: 1.08 mg/dL (ref 0.61–1.24)
GFR calc Af Amer: >60 mL/min (ref >60)
GLUCOSE: 323 mg/dL — AB (ref 65–99)
POTASSIUM: 4 mmol/L (ref 3.5–5.1)
Sodium: 136 mmol/L (ref 135–145)

## 2015-10-08 LAB — CBC
HEMATOCRIT: 48.7 % (ref 40.0–52.0)
HEMOGLOBIN: 16.6 g/dL (ref 13.0–18.0)
MCH: 31.2 pg (ref 26.0–34.0)
MCHC: 34.1 g/dL (ref 32.0–36.0)
MCV: 91.3 fL (ref 80.0–100.0)
Platelets: 193 10*3/uL (ref 150–440)
RBC: 5.33 MIL/uL (ref 4.40–5.90)
RDW: 13 % (ref 11.5–14.5)
WBC: 7 10*3/uL (ref 3.8–10.6)

## 2015-10-08 LAB — TROPONIN I
Troponin I: <0.03 ng/mL (ref <0.031)
Troponin I: <0.03 ng/mL (ref <0.031)

## 2015-10-08 MED ORDER — NITROGLYCERIN 0.4 MG SL SUBL
0.4000 mg | SUBLINGUAL_TABLET | SUBLINGUAL | Status: DC | PRN
  Administered 2015-10-08 (×3): 0.4 mg via SUBLINGUAL

## 2015-10-08 MED ORDER — NITROGLYCERIN 0.4 MG SL SUBL
SUBLINGUAL_TABLET | SUBLINGUAL | Status: AC
  Administered 2015-10-08: 0.4 mg via SUBLINGUAL
  Filled 2015-10-08: qty 3

## 2015-10-08 MED ORDER — IOHEXOL 350 MG/ML SOLN
100.0000 mL | Freq: Once | INTRAVENOUS | Status: AC | PRN
  Administered 2015-10-08: 100 mL via INTRAVENOUS

## 2015-10-08 MED ORDER — ONDANSETRON HCL 4 MG/2ML IJ SOLN
4.0000 mg | Freq: Once | INTRAMUSCULAR | Status: AC
  Administered 2015-10-08: 4 mg via INTRAVENOUS
  Filled 2015-10-08: qty 2

## 2015-10-08 MED ORDER — MORPHINE SULFATE (PF) 4 MG/ML IV SOLN
4.0000 mg | Freq: Once | INTRAVENOUS | Status: AC
  Administered 2015-10-08: 4 mg via INTRAVENOUS
  Filled 2015-10-08: qty 1

## 2015-10-08 NOTE — Discharge Instructions (Signed)
Please seek medical attention for any high fevers, chest pain, shortness of breath, change in behavior, persistent vomiting, bloody stool or any other new or concerning symptoms. ° ° °Nonspecific Chest Pain °It is often hard to find the cause of chest pain. There is always a chance that your pain could be related to something serious, such as a heart attack or a blood clot in your lungs. Chest pain can also be caused by conditions that are not life-threatening. If you have chest pain, it is very important to follow up with your doctor. ° °HOME CARE °· If you were prescribed an antibiotic medicine, finish it all even if you start to feel better. °· Avoid any activities that cause chest pain. °· Do not use any tobacco products, including cigarettes, chewing tobacco, or electronic cigarettes. If you need help quitting, ask your doctor. °· Do not drink alcohol. °· Take medicines only as told by your doctor. °· Keep all follow-up visits as told by your doctor. This is important. This includes any further testing if your chest pain does not go away. °· Your doctor may tell you to keep your head raised (elevated) while you sleep. °· Make lifestyle changes as told by your doctor. These may include: °¨ Getting regular exercise. Ask your doctor to suggest some activities that are safe for you. °¨ Eating a heart-healthy diet. Your doctor or a diet specialist (dietitian) can help you to learn healthy eating options. °¨ Maintaining a healthy weight. °¨ Managing diabetes, if necessary. °¨ Reducing stress. °GET HELP IF: °· Your chest pain does not go away, even after treatment. °· You have a rash with blisters on your chest. °· You have a fever. °GET HELP RIGHT AWAY IF: °· Your chest pain is worse. °· You have an increasing cough, or you cough up blood. °· You have severe belly (abdominal) pain. °· You feel extremely weak. °· You pass out (faint). °· You have chills. °· You have sudden, unexplained chest discomfort. °· You have  sudden, unexplained discomfort in your arms, back, neck, or jaw. °· You have shortness of breath at any time. °· You suddenly start to sweat, or your skin gets clammy. °· You feel nauseous. °· You vomit. °· You suddenly feel light-headed or dizzy. °· Your heart begins to beat quickly, or it feels like it is skipping beats. °These symptoms may be an emergency. Do not wait to see if the symptoms will go away. Get medical help right away. Call your local emergency services (911 in the [place] Do not drive yourself to the hospital. °  °This information is not intended to replace advice given to you by your health care provider. Make sure you discuss any questions you have with your health care provider. °  °Document Released: 01/03/2008 Document Revised: 08/07/2014 Document Reviewed: 02/20/2014 °Elsevier Interactive Patient Education ©2016 [employer] ° °

## 2015-10-08 NOTE — ED Notes (Signed)
C/o chest pain, SOB x 3 days, symptoms worse today.  Has a heart catheterization scheduled for Tuesday March 14th.

## 2015-10-08 NOTE — ED Provider Notes (Signed)
[facility] Emergency Department Provider Note   ____________________________________________  Time seen: ~1805  I have reviewed the triage vital signs and the nursing notes.   HISTORY  Chief Complaint Chest pain  History limited by: Not Limited   HPI Michael Gill is a 55 y.o. male who presents to the emergency department today with primary concerns for chest pain and shortness of breath. He states that the symptoms have been getting worse for the past 2-3 days. He describes pain as being on the left side. He states there is some radiation to his left shoulder. He has had some associated shortness of breath. It has been severe. He has had a history of angina and has been seen by cardiology. Patient does have a heart catheterization scheduled for next week. In addition the patient states he has been having some left leg pain and a numb tongue. No fevers.   Past Medical History  Diagnosis Date  . 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 Diabetes mellitus ([place])   . Hyperlipidemia   . CVA (cerebral infarction)   . Colon cancer ([place])   . Sleep apnea   . B12 deficiency   . CAD (coronary artery disease)     s/p MI  . DVT (deep venous thrombosis) ([place])   . Neuropathic pain, leg     Patient Active Problem List   Diagnosis Date Noted  . TIA (transient ischemic attack) 01/19/2014    Past Surgical History  Procedure Laterality Date  . Appendectomy    . Tonsillectomy      Current Outpatient Rx  Name  Route  Sig  Dispense  Refill  . atorvastatin (LIPITOR) 40 MG tablet   Oral   Take 40 mg by mouth daily.         . famotidine (PEPCID) 20 MG tablet   Oral   Take 1 tablet (20 mg total) by mouth 2 (two) times daily. Patient not taking: Reported on 03/14/2015   8 tablet   0   . HYDROmorphone (DILAUDID) 2 MG tablet   Oral   Take 1 tablet (2 mg total) by mouth every 12 (twelve) hours as needed for severe pain.   20 tablet   0   . hydrOXYzine (ATARAX/VISTARIL) 25 MG  tablet   Oral   Take 1 tablet (25 mg total) by mouth every 6 (six) hours as needed for itching. Patient taking differently: Take 20 mg by mouth every 6 (six) hours as needed for itching.    20 tablet   0   . ibuprofen (ADVIL,MOTRIN) 200 MG tablet   Oral   Take 200 mg by mouth every 6 (six) hours as needed for moderate pain.         . Insulin Lispro Prot & Lispro (HUMALOG MIX 75/25 KWIKPEN) (75-25) 100 UNIT/ML Kwikpen   Subcutaneous   Inject 45 Units into the skin daily.          . ondansetron (ZOFRAN) 4 MG tablet   Oral   Take 1 tablet (4 mg total) by mouth every 8 (eight) hours as needed for nausea or vomiting.   12 tablet   1   . oxyCODONE-acetaminophen (ROXICET) 5-325 MG tablet   Oral   Take 1 tablet by mouth every 6 (six) hours as needed.   20 tablet   0   . predniSONE (DELTASONE) 20 MG tablet      2 tablets daily x 4 days Patient not taking: Reported on 03/14/2015   8  tablet   0   . rivaroxaban (XARELTO) 20 MG TABS tablet   Oral   Take 20 mg by mouth daily with supper.         . traZODone (DESYREL) 50 MG tablet   Oral   Take 50-100 mg by mouth at bedtime.           Allergies Codeine; Shellfish allergy; and Sulfa antibiotics  Family History  Problem Relation Age of Onset  . Cancer Brother   . Alzheimer's disease Mother     Social History Social History  Substance Use Topics  . Smoking status: Never Smoker   . Smokeless tobacco: None  . Alcohol Use: No    Review of Systems  Constitutional: Negative for fever. Cardiovascular: Negative for chest pain. Respiratory: Negative for shortness of breath. Gastrointestinal: Negative for abdominal pain, vomiting and diarrhea. Neurological: Negative for headaches, focal weakness or numbness.   10-point ROS otherwise negative.  ____________________________________________   PHYSICAL EXAM:  VITAL SIGNS: ED Triage Vitals  Enc Vitals Group     BP 10/08/15 1616 148/79 mmHg     Pulse Rate  10/08/15 1616 92     Resp 10/08/15 1616 22     Temp --      Temp src --      SpO2 10/08/15 1616 97 %     Weight 10/08/15 1616 240 lb (108.863 kg)     Height 10/08/15 1616 5\' 8"  (1.727 m)     Head Cir --      Peak Flow --      Pain Score 10/08/15 1617 8   Constitutional: Alert and oriented. Appears uncomfortable.  Eyes: Conjunctivae are normal. PERRL. Normal extraocular movements. ENT   Head: Normocephalic and atraumatic.   Nose: No congestion/rhinnorhea.   Mouth/Throat: Mucous membranes are moist.   Neck: No stridor. Hematological/Lymphatic/Immunilogical: No cervical lymphadenopathy. Cardiovascular: Normal rate, regular rhythm.  No murmurs, rubs, or gallops. Respiratory: Normal respiratory effort without tachypnea nor retractions. Breath sounds are clear and equal bilaterally. No wheezes/rales/rhonchi. Gastrointestinal: Soft and nontender. No distention.  Genitourinary: Deferred Musculoskeletal: Normal range of motion in all extremities. No joint effusions.  No lower extremity tenderness nor edema. Neurologic:  Normal speech and language. No gross focal neurologic deficits are appreciated.  Skin:  Skin is warm, dry and intact. No rash noted. Psychiatric: Mood and affect are normal. Speech and behavior are normal. Patient exhibits appropriate insight and judgment.  ____________________________________________    LABS (pertinent positives/negatives)  Labs Reviewed  BASIC METABOLIC PANEL - Abnormal; Notable for the following:    CO2 18 (*)    Glucose, Bld 323 (*)    All other components within normal limits  CBC  TROPONIN I  TROPONIN I     ____________________________________________   EKG  I, Nance Pear, attending physician, personally viewed and interpreted this EKG  EKG Time: 1621 Rate: 99 Rhythm: normal sinus rhythm Axis: normal Intervals: qtc 444 QRS: narrow ST changes: no st elevation Impression: abnormal  ekg   ____________________________________________    RADIOLOGY  CXR IMPRESSION: Mild cardiomegaly without evidence of pulmonary edema. There is no acute cardiopulmonary abnormality.  CT angio IMPRESSION: No evidence of pulmonary emboli.  Mild dependent basilar opacities -likely atelectasis.  No other significant abnormalities.  ____________________________________________   PROCEDURES  Procedure(s) performed: None  Critical Care performed: No  ____________________________________________   INITIAL IMPRESSION / ASSESSMENT AND PLAN / ED COURSE  Pertinent labs & imaging results that were available during my care of the patient  were reviewed by me and considered in my medical decision making (see chart for details).  Patient presented to the emergency department today with concerns for chest pain. Patient does have a history of angina and is followed by Dr. Clayborn Bigness. 2 sets of troponin were negative here additionally a CT angiogram was done to evaluate for PE which was negative. Patient felt better after stay here in the emergency department. Will plan on discharging home to follow up with Dr. Clayborn Bigness has catheterization scheduled for this week.  ____________________________________________   FINAL CLINICAL IMPRESSION(S) / ED DIAGNOSES  Final diagnoses:  Chest pain, unspecified chest pain type     Nance Pear, MD 10/08/15 2333

## 2015-10-12 ENCOUNTER — Encounter: Payer: Self-pay | Admitting: Certified Registered Nurse Anesthetist

## 2015-10-12 ENCOUNTER — Encounter
Admission: RE | Disposition: A | Discharge status: Home / Self Care | Payer: Self-pay | Source: Ambulatory Visit | Attending: Internal Medicine

## 2015-10-12 ENCOUNTER — Ambulatory Visit
Admission: RE | Admit: 2015-10-12 | Discharge: 2015-10-12 | Disposition: A | Discharge status: Home / Self Care | Payer: Medicare HMO | Source: Ambulatory Visit | Attending: Internal Medicine | Admitting: Internal Medicine

## 2015-10-12 ENCOUNTER — Ambulatory Visit: Payer: Medicare HMO

## 2015-10-12 DIAGNOSIS — Z85038 Personal history of other malignant neoplasm of large intestine: Secondary | ICD-10-CM | POA: Diagnosis not present

## 2015-10-12 DIAGNOSIS — I209 Angina pectoris, unspecified: Secondary | ICD-10-CM | POA: Diagnosis not present

## 2015-10-12 DIAGNOSIS — E669 Obesity, unspecified: Secondary | ICD-10-CM | POA: Diagnosis not present

## 2015-10-12 DIAGNOSIS — M79604 Pain in right leg: Secondary | ICD-10-CM | POA: Insufficient documentation

## 2015-10-12 DIAGNOSIS — I69859 Hemiplegia and hemiparesis following other cerebrovascular disease affecting unspecified side: Secondary | ICD-10-CM | POA: Diagnosis not present

## 2015-10-12 DIAGNOSIS — Z7982 Long term (current) use of aspirin: Secondary | ICD-10-CM | POA: Insufficient documentation

## 2015-10-12 DIAGNOSIS — Z9889 Other specified postprocedural states: Secondary | ICD-10-CM | POA: Diagnosis not present

## 2015-10-12 DIAGNOSIS — I10 Essential (primary) hypertension: Secondary | ICD-10-CM | POA: Insufficient documentation

## 2015-10-12 DIAGNOSIS — E119 Type 2 diabetes mellitus without complications: Secondary | ICD-10-CM | POA: Diagnosis not present

## 2015-10-12 DIAGNOSIS — G4733 Obstructive sleep apnea (adult) (pediatric): Secondary | ICD-10-CM | POA: Insufficient documentation

## 2015-10-12 DIAGNOSIS — Z7901 Long term (current) use of anticoagulants: Secondary | ICD-10-CM | POA: Insufficient documentation

## 2015-10-12 DIAGNOSIS — I82401 Acute embolism and thrombosis of unspecified deep veins of right lower extremity: Secondary | ICD-10-CM

## 2015-10-12 DIAGNOSIS — Z79891 Long term (current) use of opiate analgesic: Secondary | ICD-10-CM | POA: Insufficient documentation

## 2015-10-12 DIAGNOSIS — Z86718 Personal history of other venous thrombosis and embolism: Secondary | ICD-10-CM | POA: Insufficient documentation

## 2015-10-12 DIAGNOSIS — Z794 Long term (current) use of insulin: Secondary | ICD-10-CM | POA: Insufficient documentation

## 2015-10-12 DIAGNOSIS — R011 Cardiac murmur, unspecified: Secondary | ICD-10-CM | POA: Insufficient documentation

## 2015-10-12 DIAGNOSIS — G8929 Other chronic pain: Secondary | ICD-10-CM | POA: Diagnosis not present

## 2015-10-12 DIAGNOSIS — R0602 Shortness of breath: Secondary | ICD-10-CM | POA: Diagnosis not present

## 2015-10-12 DIAGNOSIS — Z6838 Body mass index (BMI) 38.0-38.9, adult: Secondary | ICD-10-CM | POA: Diagnosis not present

## 2015-10-12 DIAGNOSIS — I252 Old myocardial infarction: Secondary | ICD-10-CM | POA: Insufficient documentation

## 2015-10-12 DIAGNOSIS — Z79899 Other long term (current) drug therapy: Secondary | ICD-10-CM | POA: Diagnosis not present

## 2015-10-12 DIAGNOSIS — R079 Chest pain, unspecified: Secondary | ICD-10-CM | POA: Diagnosis present

## 2015-10-12 HISTORY — PX: CARDIAC CATHETERIZATION: SHX172

## 2015-10-12 SURGERY — LEFT HEART CATH AND CORONARY ANGIOGRAPHY
Anesthesia: Moderate Sedation | Laterality: Left

## 2015-10-12 MED ORDER — MIDAZOLAM HCL 2 MG/2ML IJ SOLN
INTRAMUSCULAR | Status: DC | PRN
Start: 2015-10-12 — End: 2015-10-12
  Administered 2015-10-12 (×2): 1 mg via INTRAVENOUS

## 2015-10-12 MED ORDER — HEPARIN (PORCINE) IN NACL 2-0.9 UNIT/ML-% IJ SOLN
INTRAMUSCULAR | Status: AC
  Filled 2015-10-12: qty 1000

## 2015-10-12 MED ORDER — MIDAZOLAM HCL 2 MG/2ML IJ SOLN
INTRAMUSCULAR | Status: AC
  Filled 2015-10-12: qty 2

## 2015-10-12 MED ORDER — METHYLPREDNISOLONE SODIUM SUCC 125 MG IJ SOLR
INTRAMUSCULAR | Status: AC
  Administered 2015-10-12: 125 mg via INTRAVENOUS
  Filled 2015-10-12: qty 2

## 2015-10-12 MED ORDER — DIPHENHYDRAMINE HCL 25 MG PO CAPS
ORAL_CAPSULE | ORAL | Status: AC
  Administered 2015-10-12: 25 mg via ORAL
  Filled 2015-10-12: qty 1

## 2015-10-12 MED ORDER — FENTANYL CITRATE (PF) 100 MCG/2ML IJ SOLN
INTRAMUSCULAR | Status: DC | PRN
  Administered 2015-10-12 (×2): 25 ug via INTRAVENOUS

## 2015-10-12 MED ORDER — SODIUM CHLORIDE 0.9% FLUSH: 3.0000 mL | Freq: Two times a day (BID) | INTRAVENOUS | Status: DC

## 2015-10-12 MED ORDER — SODIUM CHLORIDE 0.9 % IV SOLN
INTRAVENOUS | Status: DC
  Administered 2015-10-12: 12:00:00 via INTRAVENOUS

## 2015-10-12 MED ORDER — METHYLPREDNISOLONE SODIUM SUCC 125 MG IJ SOLR
125.0000 mg | Freq: Once | INTRAMUSCULAR | Status: AC
  Administered 2015-10-12: 125 mg via INTRAVENOUS

## 2015-10-12 MED ORDER — FENTANYL CITRATE (PF) 100 MCG/2ML IJ SOLN
INTRAMUSCULAR | Status: AC
  Filled 2015-10-12: qty 2

## 2015-10-12 MED ORDER — DIPHENHYDRAMINE HCL 25 MG PO TABS
25.0000 mg | ORAL_TABLET | Freq: Once | ORAL | Status: AC
  Administered 2015-10-12: 25 mg via ORAL
  Filled 2015-10-12: qty 1

## 2015-10-12 SURGICAL SUPPLY — 9 items
CATH INFINITI 5FR ANG PIGTAIL (CATHETERS) ×3 IMPLANT
CATH INFINITI 5FR JL4 (CATHETERS) ×3 IMPLANT
CATH INFINITI JR4 5F (CATHETERS) ×3 IMPLANT
DEVICE CLOSURE MYNXGRIP 5F (Vascular Products) ×3 IMPLANT
KIT MANI 3VAL PERCEP (MISCELLANEOUS) ×3 IMPLANT
NEEDLE PERC 18GX7CM (NEEDLE) ×3 IMPLANT
PACK CARDIAC CATH (CUSTOM PROCEDURE TRAY) ×3 IMPLANT
SHEATH AVANTI 5FR X 11CM (SHEATH) ×3 IMPLANT
WIRE EMERALD 3MM-J .035X150CM (WIRE) ×3 IMPLANT

## 2015-10-12 NOTE — Discharge Instructions (Signed)

## 2015-10-12 NOTE — OR Nursing (Signed)
MD read [place] reports, order to discharge patient home given

## 2015-10-12 NOTE — OR Nursing (Signed)
Patient sat up , groin stable, eating lunch and reports "my leg is numb" patient moving toes on command, reports lateral side of calf feeling numb and unable to lift leg at knee, MD paged and aware of above., new orders noted

## 2015-10-12 NOTE — OR Nursing (Signed)
Continues reporting "unable to move leg", patient does respond to noxious stimuli with toe nailbed pressure,,awaiting ultrasound

## 2015-10-13 ENCOUNTER — Encounter: Payer: Self-pay | Admitting: Internal Medicine

## 2015-12-24 ENCOUNTER — Inpatient Hospital Stay: Payer: Medicare HMO | Admitting: Hematology and Oncology

## 2016-01-17 ENCOUNTER — Inpatient Hospital Stay: Payer: Medicare HMO | Admitting: Hematology and Oncology

## 2016-02-18 ENCOUNTER — Ambulatory Visit: Payer: Medicare HMO | Admitting: Hematology and Oncology

## 2016-03-10 ENCOUNTER — Inpatient Hospital Stay: Payer: Medicare HMO

## 2016-03-10 ENCOUNTER — Ambulatory Visit
Admission: RE | Admit: 2016-03-10 | Discharge: 2016-03-10 | Disposition: A | Discharge status: Home / Self Care | Payer: Medicare HMO | Source: Ambulatory Visit | Attending: Hematology and Oncology | Admitting: Hematology and Oncology

## 2016-03-10 ENCOUNTER — Other Ambulatory Visit: Payer: Self-pay | Admitting: Hematology and Oncology

## 2016-03-10 ENCOUNTER — Encounter (INDEPENDENT_AMBULATORY_CARE_PROVIDER_SITE_OTHER): Payer: Self-pay

## 2016-03-10 ENCOUNTER — Inpatient Hospital Stay: Payer: Medicare HMO | Attending: Hematology and Oncology | Admitting: Hematology and Oncology

## 2016-03-10 DIAGNOSIS — Z7982 Long term (current) use of aspirin: Secondary | ICD-10-CM | POA: Insufficient documentation

## 2016-03-10 DIAGNOSIS — E119 Type 2 diabetes mellitus without complications: Secondary | ICD-10-CM | POA: Insufficient documentation

## 2016-03-10 DIAGNOSIS — G4730 Sleep apnea, unspecified: Secondary | ICD-10-CM | POA: Diagnosis not present

## 2016-03-10 DIAGNOSIS — E538 Deficiency of other specified B group vitamins: Secondary | ICD-10-CM | POA: Diagnosis not present

## 2016-03-10 DIAGNOSIS — I2510 Atherosclerotic heart disease of native coronary artery without angina pectoris: Secondary | ICD-10-CM | POA: Diagnosis not present

## 2016-03-10 DIAGNOSIS — E785 Hyperlipidemia, unspecified: Secondary | ICD-10-CM | POA: Insufficient documentation

## 2016-03-10 DIAGNOSIS — I82431 Acute embolism and thrombosis of right popliteal vein: Secondary | ICD-10-CM

## 2016-03-10 DIAGNOSIS — I252 Old myocardial infarction: Secondary | ICD-10-CM | POA: Diagnosis not present

## 2016-03-10 DIAGNOSIS — Z79899 Other long term (current) drug therapy: Secondary | ICD-10-CM | POA: Insufficient documentation

## 2016-03-10 DIAGNOSIS — Z8673 Personal history of transient ischemic attack (TIA), and cerebral infarction without residual deficits: Secondary | ICD-10-CM | POA: Insufficient documentation

## 2016-03-10 DIAGNOSIS — Z86718 Personal history of other venous thrombosis and embolism: Secondary | ICD-10-CM | POA: Diagnosis not present

## 2016-03-10 LAB — COMPREHENSIVE METABOLIC PANEL
ALT: 20 U/L (ref 17–63)
AST: 17 U/L (ref 15–41)
Albumin: 4.4 g/dL (ref 3.5–5.0)
Alkaline Phosphatase: 61 U/L (ref 38–126)
Anion gap: 7 (ref 5–15)
BUN: 14 mg/dL (ref 6–20)
CO2: 23 mmol/L (ref 22–32)
Calcium: 8.9 mg/dL (ref 8.9–10.3)
Chloride: 107 mmol/L (ref 101–111)
Creatinine, Ser: 1.09 mg/dL (ref 0.61–1.24)
GFR calc Af Amer: >60 mL/min (ref >60)
GFR calc non Af Amer: >60 mL/min (ref >60)
Glucose, Bld: 145 mg/dL — ABNORMAL HIGH (ref 65–99)
Potassium: 3.8 mmol/L (ref 3.5–5.1)
Sodium: 137 mmol/L (ref 135–145)
Total Bilirubin: 1 mg/dL (ref 0.3–1.2)
Total Protein: 7.3 g/dL (ref 6.5–8.1)

## 2016-03-10 LAB — CBC WITH DIFFERENTIAL/PLATELET
Basophils Absolute: 0 10*3/uL (ref 0–0.1)
Basophils Relative: 0 %
Eosinophils Absolute: 0.1 10*3/uL (ref 0–0.7)
Eosinophils Relative: 1 %
HCT: 47.7 % (ref 40.0–52.0)
Hemoglobin: 16.9 g/dL (ref 13.0–18.0)
Lymphocytes Relative: 20 %
Lymphs Abs: 1.8 10*3/uL (ref 1.0–3.6)
MCH: 31.7 pg (ref 26.0–34.0)
MCHC: 35.3 g/dL (ref 32.0–36.0)
MCV: 89.7 fL (ref 80.0–100.0)
Monocytes Absolute: 0.8 10*3/uL (ref 0.2–1.0)
Monocytes Relative: 9 %
Neutro Abs: 6.1 10*3/uL (ref 1.4–6.5)
Neutrophils Relative %: 70 %
Platelets: 212 10*3/uL (ref 150–440)
RBC: 5.32 MIL/uL (ref 4.40–5.90)
RDW: 13.1 % (ref 11.5–14.5)
WBC: 8.8 10*3/uL (ref 3.8–10.6)

## 2016-03-10 LAB — ANTITHROMBIN III: AntiThromb III Func: 104 % (ref 75–120)

## 2016-03-10 NOTE — Progress Notes (Signed)
[facility] day:  03/10/16  Chief Complaint: Michael Gill is a 55 y.o. male with DVT who is referred in consultation by Dr. Rutherford Guys for assessment and management.  HPI:  The patient was hospitalized at [facility] in 10/2014 for sepsis with aseptic meningitis, viral pneumonia with hypoxic respiratory failure.  While hospitalized, he was noted to have a right lower extremity DVT (age indeterminant).  Chest CT angiogram on 11/04/2014 revealed no evidence of pulmonary embolism.  He was started on Xarelto.  He states that he stopped taking Xarelto in 09/2015 as it was too expensive.  He has had several imaging studies:   Bilateral lower extremity duplex on 11/13/2014 revealed nonocclusive thrombus in the right popliteal vein. Acuity was uncertain.  There was no evidence of deep venous thrombosis of the left lower extremity.  Bilateral lower extremity duplex on 03/14/2015 revealed nonocclusive deep venous thrombosis within the right popliteal vein.  There was no evidence of deep venous thrombosis within the left lower extremity.  Right lower extremity duplex on 10/12/2015 revealed no evidence of acute DVT within the right lower extremity.  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  Chest CT angiogram on 10/08/2015 revealed no evidence of pulmonary embolism.  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  Echo in 10/2014 revealed a normal LVEF and normal right ventricular contractile performance.  Symptomatically, he states "I do what I can".  He has pain in his legs (right greater than left) when walking. He needs a wheelchair to get up stairs. His activity varies day to day.  He notes that last night his sugar dropped.  He describes an episode where he turns in his arms and legs (? pseudoseizure).  He has a pain in his right leg.  He had a colonoscopy 2-3 years ago which was  "ok" per patient (he notes a few polyps).  He does not smoke.   Past Medical History:  Diagnosis Date  . B12 deficiency   . CAD (coronary artery disease)    s/p MI  . Colon cancer ([place])   . CVA (cerebral infarction)   . Diabetes mellitus ([place])   . DVT (deep venous thrombosis) ([place])   . Hyperlipidemia   . Neuropathic pain, leg   . 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 Sleep apnea     Past Surgical History:  Procedure Laterality Date  . APPENDECTOMY    . CARDIAC CATHETERIZATION Left 10/12/2015   Procedure: Left Heart Cath and Coronary Angiography;  Surgeon: Yolonda Kida, MD;  Location: [facility];  Service: Cardiovascular;  Laterality: Left;  . TONSILLECTOMY      Family History  Problem Relation Age of Onset  . Cancer Brother   . Alzheimer's disease Mother     Social History:  reports that he has never smoked. He does not have any smokeless tobacco history on file. He reports that he does not drink alcohol or use drugs.  He is a [employer].  He is disabled.  He has been married to his wife 9 years (10 years in 10/2016).  The patient is accompanied by his wife, Butch Penny, today.  Allergies:  Allergies  Allergen Reactions  . Codeine Other (See Comments)    Reaction:  Hallucinations   . Shellfish Allergy Hives  . Sulfa Antibiotics Hives  Current Medications: Current Outpatient Prescriptions  Medication Sig Dispense Refill  . aspirin EC 81 MG tablet Take 81 mg by mouth daily.    Marland Kitchen atorvastatin (LIPITOR) 10 MG tablet Take 10 mg by mouth daily.    Marland Kitchen gabapentin (NEURONTIN) 300 MG capsule Take 300 mg by mouth 3 (three) times daily.    . insulin aspart protamine- aspart (NOVOLOG MIX 70/30) (70-30) 100 UNIT/ML injection Inject 33 Units into the skin 2 (two) times daily.     . traZODone (DESYREL) 50 MG tablet Take 50-100 mg by mouth at bedtime as needed for sleep.     No current facility-administered medications for this visit.     Review of  Systems:  GENERAL:  Fatigue.  No fevers.  Sweats when sugars drop.  Weight loss of 13 pounds (cut back). PERFORMANCE STATUS (ECOG):  3 HEENT:  Visual changes.  No runny nose, sore throat, mouth sores or tenderness. Lungs: Shortness of breath, "some".  Non-productive cough.  No hemoptysis. Cardiac:  No chest pain, palpitations, orthopnea, or PND. GI:  No nausea, vomiting, diarrhea, constipation, melena or hematochezia. GU:  No urgency, frequency, dysuria, or hematuria. Musculoskeletal:  No back pain.  Hip pain.  No muscle tenderness. Extremities:  No pain or swelling. Skin:  No rashes or skin changes. Neuro:  Some headaches.  Pseudoseizures.  CVA x 6.  No numbness or weakness, balance or coordination issues. Endocrine:  Diabetes.  No thyroid issues, hot flashes or night sweats. Psych:  No mood changes, depression or anxiety. Pain:  No focal pain. Review of systems:  All other systems reviewed and found to be negative.  Physical Exam: Blood pressure 115/77, pulse 90, temperature 97.7 F (36.5 C), temperature source Tympanic, resp. rate 18, weight 243 lb 9.4 oz (110.5 kg). GENERAL:  Well developed, well nourished, gentleman sitting comfortably in a wheelchair in the exam room rubbing his legs. MENTAL STATUS:  Alert and oriented to person, place and time. HEAD:  Short gray hair. Male pattern baldness.  Normocephalic, atraumatic, face symmetric, no Cushingoid features. EYES:  Glasses.  Blue eyes.  Pupils equal round and reactive to light and accomodation.  No conjunctivitis or scleral icterus. ENT:  Oropharynx clear without lesion.  Tongue normal. Mucous membranes moist.  RESPIRATORY:  Clear to auscultation without rales, wheezes or rhonchi. CARDIOVASCULAR:  Regular rate and rhythm without murmur, rub or gallop. ABDOMEN:  Soft, non-tender, with active bowel sounds, and no appreciable hepatosplenomegaly.  No masses. SKIN:  No rashes, ulcers or lesions. EXTREMITIES: Right lower extremity edema.   No skin discoloration or tenderness.  No palpable cords. LYMPH NODES: No palpable cervical, supraclavicular, axillary or inguinal adenopathy  NEUROLOGICAL: Unremarkable. PSYCH:  Appropriate.   No visits with results within 3 Day(s) from this visit.  Latest known visit with results is:  Admission on 10/08/2015, Discharged on 10/08/2015  Component Date Value Ref Range Status  . Sodium 10/08/2015 136  135 - 145 mmol/L Final  . Potassium 10/08/2015 4.0  3.5 - 5.1 mmol/L Final  . Chloride 10/08/2015 105  101 - 111 mmol/L Final  . CO2 10/08/2015 18* 22 - 32 mmol/L Final  . Glucose, Bld 10/08/2015 323* 65 - 99 mg/dL Final  . BUN 10/08/2015 16  6 - 20 mg/dL Final  . Creatinine, Ser 10/08/2015 1.08  0.61 - 1.24 mg/dL Final  . Calcium 10/08/2015 9.3  8.9 - 10.3 mg/dL Final  . GFR calc non Af Amer 10/08/2015 >60  >60 mL/min Final  . GFR calc  Af Amer 10/08/2015 >60  >60 mL/min Final   Comment: (NOTE) The eGFR has been calculated using the CKD EPI equation. This calculation has not been validated in all clinical situations. eGFR's persistently <60 mL/min signify possible Chronic Kidney Disease.   . Anion gap 10/08/2015 13  5 - 15 Final  . WBC 10/08/2015 7.0  3.8 - 10.6 K/uL Final  . RBC 10/08/2015 5.33  4.40 - 5.90 MIL/uL Final  . Hemoglobin 10/08/2015 16.6  13.0 - 18.0 g/dL Final  . HCT 10/08/2015 48.7  40.0 - 52.0 % Final  . MCV 10/08/2015 91.3  80.0 - 100.0 fL Final  . MCH 10/08/2015 31.2  26.0 - 34.0 pg Final  . MCHC 10/08/2015 34.1  32.0 - 36.0 g/dL Final  . RDW 10/08/2015 13.0  11.5 - 14.5 % Final  . Platelets 10/08/2015 193  150 - 440 K/uL Final  . Troponin I 10/08/2015 <0.03  <0.031 ng/mL Final   Comment:        NO INDICATION OF MYOCARDIAL INJURY.   . Troponin I 10/08/2015 <0.03  <0.031 ng/mL Final   Comment:        NO INDICATION OF MYOCARDIAL INJURY.     Assessment:  ZIMRI BRENNEN is a 55 y.o. male with a history of right popliteal vein DVT discovered while hospitalized at  [facility] in 10/2014.  He stopped taking Xarelto in 09/2015 secondary to cost issues.  He has had several imaging studies:   Bilateral lower extremity duplex on 11/13/2014 revealed nonocclusive thrombus in the right popliteal vein. Acuity was uncertain.  There was no evidence of deep venous thrombosis of the left lower extremity.  Right lower extremity duplex on 10/12/2015 revealed no evidence of acute DVT within the right lower extremity.  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  Chest CT angiogram on 10/08/2015 revealed no evidence of pulmonary embolism.  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  He has pseudoseizures.  He has undergone an extensive work-up.  Echo in 10/2014 revealed a normal LVEF and normal right ventricular contractile performance.  He had a colonoscopy 2-3 years ago which was "ok" per patient (a few polyps).  Symptomatically, he has a pain in his right leg.    Plan: 1.  Review diagnosis of right popliteal DVT in setting of recent hospitalization (acuity unclear).  He was treated with almost a year of anticoagulation.  Discuss hypercoagulable work-up. 2.  Labs today:  CBC with diff, CMP, factor V Leiden, prothrombin gene mutation, protein C activity/antigen, protein S activity/antigen, ATIII activity/antigen, lupus anticoagulant, anticardiolipin antibodies, beta2-glycoprotein. 3.  Right lower extremity ultrasound. 4.  RTC in 2 weeks for MD assessment and review of testing.   Lequita Asal, MD  03/10/2016, 12:26 PM

## 2016-03-10 NOTE — Progress Notes (Signed)
States had "rough night" last night due to pain in right leg and difficulty with blood sugars. Past 3-4 weeks pt has had severe pain in right leg due to DVT. Took xarelto for almost 1 year and had to stop taking due to cost of medication.

## 2016-03-11 LAB — PROTEIN C ACTIVITY: Protein C Activity: 137 % (ref 73–180)

## 2016-03-11 LAB — PROTEIN S, TOTAL: Protein S Ag, Total: 149 % (ref 60–150)

## 2016-03-11 LAB — LUPUS ANTICOAGULANT PANEL
DRVVT: 39.2 s (ref 0.0–47.0)
PTT Lupus Anticoagulant: 32.3 s (ref 0.0–51.9)

## 2016-03-11 LAB — PROTEIN S ACTIVITY: Protein S Activity: 99 % (ref 63–140)

## 2016-03-12 LAB — PROTEIN C, TOTAL: Protein C, Total: 96 % (ref 60–150)

## 2016-03-13 LAB — CARDIOLIPIN ANTIBODIES, IGG, IGM, IGA
Anticardiolipin IgA: <9 APL U/mL (ref 0–11)
Anticardiolipin IgG: <9 GPL U/mL (ref 0–14)
Anticardiolipin IgM: <9 MPL U/mL (ref 0–12)

## 2016-03-14 LAB — BETA-2-GLYCOPROTEIN I ABS, IGG/M/A
Beta-2 Glyco I IgG: <9 GPI IgG units (ref 0–20)
Beta-2-Glycoprotein I IgA: <9 GPI IgA units (ref 0–25)
Beta-2-Glycoprotein I IgM: <9 GPI IgM units (ref 0–32)

## 2016-03-15 LAB — PROTHROMBIN GENE MUTATION

## 2016-03-15 LAB — FACTOR 5 LEIDEN

## 2016-03-27 ENCOUNTER — Inpatient Hospital Stay: Payer: Medicare HMO | Admitting: Hematology and Oncology

## 2016-03-27 ENCOUNTER — Encounter: Payer: Self-pay | Admitting: Hematology and Oncology

## 2016-03-27 NOTE — Progress Notes (Deleted)
Michael Gill  Chief Complaint: Michael Gill is a 55 y.o. male with DVT who is seen for review of work-up and discussion regarding direction of therapy.  HPI:  The patient was last seen in the hematology clinic on 03/10/2016 for initial consultation.  He was discovered to have a right popliteal DVT (age indeterminate) during at admission at [facility] for sepsis with aseptic meningitis, viral pneumonia with hypoxic respiratory failure.  He was on Xarelto from 10/2014 to 09/2015.  Xarelto was stopped secondary to costs.  At last visit, he complained of right leg pain.  Exam revealed edema.  Right lower extremity duplex revealed no evidence of acute or chronic DVT within the right lower extremity.  Hypercoagulable workup revealed the following normal studies:  CBC with differential, comprehensive metabolic panel,  Factor V Leiden, lupus anticoagulant panel, anticardiolipin antibodies, beta-2 glycoprotein, protein C total (96%), protein C activity (137%), protein S total (149%), protein S activity (99%), and ant-thrombin III activity (104%).    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   Symptomatically, he states "I do what I can".  He has pain in his legs (right greater than left) when walking. He needs a wheelchair to get up stairs. His activity varies day to day.  He notes that last night his sugar dropped.  He describes an episode where he turns in his arms and legs (? pseudoseizure).  He has a pain in his right leg.  He had a colonoscopy 2-3 years ago which was "ok" per patient (he notes a few polyps).  He does not smoke.   Past Medical History:  Diagnosis Date  . B12 deficiency   . CAD (coronary artery disease)    s/p MI  . Colon cancer ([place])   . CVA (cerebral infarction)   . Diabetes mellitus ([place])   . DVT (deep venous thrombosis) ([place])   . Hyperlipidemia   . Neuropathic pain, leg   .  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 Sleep apnea     Past Surgical History:  Procedure Laterality Date  . APPENDECTOMY    . CARDIAC CATHETERIZATION Left 10/12/2015   Procedure: Left Heart Cath and Coronary Angiography;  Surgeon: Yolonda Kida, MD;  Location: [place] [facility];  Service: Cardiovascular;  Laterality: Left;  . TONSILLECTOMY      Family History  Problem Relation Age of Onset  . Cancer Brother   . Alzheimer's disease Mother     Social History:  reports that he has never smoked. He does not have any smokeless tobacco history on file. He reports that he does not drink alcohol or use drugs.  He is a [employer].  He is disabled.  He has been married to his wife 9 years (10 years in 10/2016).  The patient is accompanied by his wife, Butch Penny, today.  Allergies:  Allergies  Allergen Reactions  . Codeine Other (See Comments)    Reaction:  Hallucinations   . Shellfish Allergy Hives  . Sulfa Antibiotics Hives    Current Medications: Current Outpatient Prescriptions  Medication Sig Dispense Refill  . aspirin EC 81 MG tablet Take 81 mg by mouth daily.    Marland Kitchen atorvastatin (LIPITOR) 10 MG tablet Take 10 mg by mouth daily.    Marland Kitchen gabapentin (NEURONTIN) 300 MG capsule Take 300 mg by mouth 3 (three) times daily.    . insulin aspart protamine- aspart (  NOVOLOG MIX 70/30) (70-30) 100 UNIT/ML injection Inject 33 Units into the skin 2 (two) times daily.     . traZODone (DESYREL) 50 MG tablet Take 50-100 mg by mouth at bedtime as needed for sleep.     No current facility-administered medications for this visit.     Review of Systems:  GENERAL:  Fatigue.  No fevers.  Sweats when sugars drop.  Weight loss of 13 pounds (cut back). PERFORMANCE STATUS (ECOG):  3 HEENT:  Visual changes.  No runny nose, sore throat, mouth sores or tenderness. Lungs: Shortness of breath, "some".  Non-productive cough.  No hemoptysis. Cardiac:  No chest pain, palpitations, orthopnea, or  PND. GI:  No nausea, vomiting, diarrhea, constipation, melena or hematochezia. GU:  No urgency, frequency, dysuria, or hematuria. Musculoskeletal:  No back pain.  Hip pain.  No muscle tenderness. Extremities:  No pain or swelling. Skin:  No rashes or skin changes. Neuro:  Some headaches.  Pseudoseizures.  CVA x 6.  No numbness or weakness, balance or coordination issues. Endocrine:  Diabetes.  No thyroid issues, hot flashes or night sweats. Psych:  No mood changes, depression or anxiety. Pain:  No focal pain. Review of systems:  All other systems reviewed and found to be negative.  Physical Exam: There were no vitals taken for this visit. GENERAL:  Well developed, well nourished, gentleman sitting comfortably in a wheelchair in the exam room rubbing his legs. MENTAL STATUS:  Alert and oriented to person, place and time. HEAD:  Short gray hair. Male pattern baldness.  Normocephalic, atraumatic, face symmetric, no Cushingoid features. EYES:  Glasses.  Blue eyes.  Pupils equal round and reactive to light and accomodation.  No conjunctivitis or scleral icterus. ENT:  Oropharynx clear without lesion.  Tongue normal. Mucous membranes moist.  RESPIRATORY:  Clear to auscultation without rales, wheezes or rhonchi. CARDIOVASCULAR:  Regular rate and rhythm without murmur, rub or gallop. ABDOMEN:  Soft, non-tender, with active bowel sounds, and no appreciable hepatosplenomegaly.  No masses. SKIN:  No rashes, ulcers or lesions. EXTREMITIES: Right lower extremity edema.  No skin discoloration or tenderness.  No palpable cords. LYMPH NODES: No palpable cervical, supraclavicular, axillary or inguinal adenopathy  NEUROLOGICAL: Unremarkable. PSYCH:  Appropriate.   No visits with results within 3 Day(s) from this visit.  Latest known visit with results is:  Appointment on 03/10/2016  Component Date Value Ref Range Status  . WBC 03/10/2016 8.8  3.8 - 10.6 K/uL Final  . RBC 03/10/2016 5.32  4.40 - 5.90  MIL/uL Final  . Hemoglobin 03/10/2016 16.9  13.0 - 18.0 g/dL Final  . HCT 03/10/2016 47.7  40.0 - 52.0 % Final  . MCV 03/10/2016 89.7  80.0 - 100.0 fL Final  . MCH 03/10/2016 31.7  26.0 - 34.0 pg Final  . MCHC 03/10/2016 35.3  32.0 - 36.0 g/dL Final  . RDW 03/10/2016 13.1  11.5 - 14.5 % Final  . Platelets 03/10/2016 212  150 - 440 K/uL Final  . Neutrophils Relative % 03/10/2016 70  % Final  . Neutro Abs 03/10/2016 6.1  1.4 - 6.5 K/uL Final  . Lymphocytes Relative 03/10/2016 20  % Final  . Lymphs Abs 03/10/2016 1.8  1.0 - 3.6 K/uL Final  . Monocytes Relative 03/10/2016 9  % Final  . Monocytes Absolute 03/10/2016 0.8  0.2 - 1.0 K/uL Final  . Eosinophils Relative 03/10/2016 1  % Final  . Eosinophils Absolute 03/10/2016 0.1  0 - 0.7 K/uL Final  . Basophils  Relative 03/10/2016 0  % Final  . Basophils Absolute 03/10/2016 0.0  0 - 0.1 K/uL Final  . Sodium 03/10/2016 137  135 - 145 mmol/L Final  . Potassium 03/10/2016 3.8  3.5 - 5.1 mmol/L Final  . Chloride 03/10/2016 107  101 - 111 mmol/L Final  . CO2 03/10/2016 23  22 - 32 mmol/L Final  . Glucose, Bld 03/10/2016 145* 65 - 99 mg/dL Final  . BUN 03/10/2016 14  6 - 20 mg/dL Final  . Creatinine, Ser 03/10/2016 1.09  0.61 - 1.24 mg/dL Final  . Calcium 03/10/2016 8.9  8.9 - 10.3 mg/dL Final  . Total Protein 03/10/2016 7.3  6.5 - 8.1 g/dL Final  . Albumin 03/10/2016 4.4  3.5 - 5.0 g/dL Final  . AST 03/10/2016 17  15 - 41 U/L Final  . ALT 03/10/2016 20  17 - 63 U/L Final  . Alkaline Phosphatase 03/10/2016 61  38 - 126 U/L Final  . Total Bilirubin 03/10/2016 1.0  0.3 - 1.2 mg/dL Final  . GFR calc non Af Amer 03/10/2016 >60  >60 mL/min Final  . GFR calc Af Amer 03/10/2016 >60  >60 mL/min Final   Comment: (NOTE) The eGFR has been calculated using the CKD EPI equation. This calculation has not been validated in all clinical situations. eGFR's persistently <60 mL/min signify possible Chronic Kidney Disease.   . Anion gap 03/10/2016 7  5 - 15  Final  . Anticardiolipin IgG 03/13/2016 <9  0 - 14 GPL U/mL Final   Comment: (NOTE)                          Negative:              <15                          Indeterminate:     15 - 20                          Low-Med Positive: >20 - 80                          High Positive:         >80   . Anticardiolipin IgM 03/13/2016 <9  0 - 12 MPL U/mL Final   Comment: (NOTE)                          Negative:              <13                          Indeterminate:     13 - 20                          Low-Med Positive: >20 - 80                          High Positive:         >80   . Anticardiolipin IgA 03/13/2016 <9  0 - 11 APL U/mL Final   Comment: (NOTE)  Negative:              <12                          Indeterminate:     12 - 20                          Low-Med Positive: >20 - 80                          High Positive:         >80 Performed At: [facility] [place], [place] [id number] Lindon Romp MD [id number]   . Recommendations-F5LEID: 03/15/2016 Comment   Final   Comment: (NOTE) Result:  Negative (no mutation found) Factor V Leiden is a specific mutation (R506Q) in the factor V gene that is associated with an increased risk of venous thrombosis. Factor V Leiden is more resistant to inactivation by activated protein C.  As a result, factor V persists in the circulation leading to a mild hyper- coagulable state.  The Leiden mutation accounts for 90% - 95% of APC resistance.  Factor V Leiden has been reported in patients with deep vein thrombosis, pulmonary embolus, central retinal vein occlusion, cerebral sinus thrombosis and hepatic vein thrombosis. Other risk factors to be considered in the workup for venous thrombosis include the G20210A mutation in the factor II (prothrombin) gene, protein S and C deficiency, and antithrombin deficiencies. Anticardiolipin antibody and lupus anticoagulant analysis may be appropriate  for certain patients, as well as homocysteine levels. Contact your local [facility] for information on how to order additi                          onal testing if desired.   . Comment 03/15/2016 Comment   Corrected   Comment: (NOTE) **Genetic counselors are available for health care providers to**  discuss results at [phone]). Methodology: DNA analysis of the Factor V gene was performed by allele-specific PCR. The diagnostic sensitivity and specificity is >99% for both. Molecular-based testing is highly accurate, but as in any laboratory test, diagnostic errors may occur. All test results must be combined with clinical information for the most accurate interpretation. This test was developed and its performance characteristics determined by [facility]. It has not been cleared or approved by the Food and Drug Administration. References: Voelkerding K (1996).  Clin Lab Med [phone]. Allison Quarry, PhD, [facility] Ruben Reason, PhD, [facility] Jens Som, PhD, [facility] Annetta Maw, M.S., PhD, [facility] Alfredo Bach, PhD, [facility] Norva Riffle, PhD, [facility] Earlean Polka PhD, [facility] Performed At: [place], [place] [id number] Rema Fendt                          D HU:[id number]   . Recommendations-PTGENE: 03/15/2016 Comment*  Final   Comment: (NOTE) SINGLE G-20210-A MUTATION IDENTIFIED (HETEROZYGOTE) Comment: A point mutation (G20210A) in the factor II (prothrombin) gene is the second most common cause of inherited thrombophilia. The incidence of this mutation in the [place] Caucasian population is about 2% and in the [place] American population it is approximately 0.5%. This mutation is rare in the [place] and Native American population. Being heterozygous for a prothrombin mutation increases the risk for developing venous thrombosis about 2  to 3 times above the general population risk. Being homozygous for the prothrombin gene  mutation increases the relative risk for venous thrombosis further, although it is not yet known how much further the risk is increased. In women heterozygous for the prothrombin gene mutation, the use of estrogen containing oral contraceptives increases the relative risk of venous thrombosis about 16 times and the risk of developing cerebral thrombosis is also significantly increased. In                           pregnancy the prothrombin gene mutation increases risk for venous thrombosis and may increase risk for stillbirth, placental abruption, pre-eclampsia and fetal growth restriction. If the patient possesses two or more congenital or acquired thrombophilic risk factors, the risk for thrombosis may rise to more than the sum of the risk ratios for the individual mutations. This assay detects only the prothrombin G20210A mutation and does not measure genetic abnormalities elsewhere in the genome. Other thrombotic risk factors may be pursued through systematic clinical laboratory analysis. These factors include the R506Q (Leiden) mutation in the Factor V gene, plasma homocysteine levels, as well as testing for deficiencies of antithrombin III, protein C and protein S.   . Additional Information 03/15/2016 Comment   Final   Comment: (NOTE) Genetic Counselors are available for health care providers to discuss results at [phone]). Methodology: DNA analysis of the Factor II gene was performed by PCR amplification followed by restriction analysis. The diagnostic sensitivity is >99% for both. All the tests must be combined with clinical information for the most accurate interpretation. Molecular-based testing is highly accurate, but as in any laboratory test, diagnostic errors may occur. This test was developed and its performance characteristics determined by [facility]. It has not been cleared or approved by the Food and Drug Administration. Poort SR, et al. Blood. 1996;  90:3009-2330. Varga EA. Circulation. 2004; 076:A26-J33. Mervin Hack, et [place]; 19:700-703. Allison Quarry, PhD, [facility] Ruben Reason, PhD, [facility] Jens Som, PhD, [facility] Annetta Maw, M.S., PhD, [facility] Alfredo Bach, PhD, [facility] Norva Riffle, PhD, [facility] Earlean Polka,                           PhD, [facility] Performed At: [facility] [address] [place], [place] [id number] Nechama Guard MD [id number]   . Beta-2 Glyco I IgG 03/14/2016 <9  0 - 20 GPI IgG units Final   Comment: (NOTE) The reference interval reflects a 3SD or 99th percentile interval, which is thought to represent a potentially clinically significant result in accordance with the International Consensus Statement on the classification criteria for definitive antiphospholipid syndrome (APS). J Thromb Haem 2006;4:295-306.   . Beta-2-Glycoprotein I IgM 03/14/2016 <9  0 - 32 GPI IgM units Final   Comment: (NOTE) The reference interval reflects a 3SD or 99th percentile interval, which is thought to represent a potentially clinically significant result in accordance with the International Consensus Statement on the classification criteria for definitive antiphospholipid syndrome (APS). J Thromb Haem 2006;4:295-306. Performed At: [facility] [place], [place] [id number] Lindon Romp MD [id number]   . Beta-2-Glycoprotein I IgA 03/14/2016 <9  0 - 25 GPI IgA units Final   Comment: (NOTE) The reference interval reflects a 3SD or 99th percentile interval, which is thought to represent a potentially clinically significant result in accordance with the International Consensus  Statement on the classification criteria for definitive antiphospholipid syndrome (APS). J Thromb Haem 2006;4:295-306.   Marland Kitchen PTT Lupus Anticoagulant 03/11/2016 32.3  0.0 - 51.9 sec Final  . DRVVT 03/11/2016 39.2  0.0 - 47.0 sec Final  . Lupus Anticoag Interp  03/11/2016 Comment:   Corrected   Comment: (NOTE) No lupus anticoagulant was detected. Performed At: [facility] [place], [place] [id number] Lindon Romp MD [id number]   . Protein C, Total 03/12/2016 96  60 - 150 % Final   Comment: (NOTE) Performed At: [facility] [place], [place] [id number] Lindon Romp MD [id number]   . Protein C Activity 03/11/2016 137  73 - 180 % Final   Comment: (NOTE) Performed At: [facility] [place], [place] [id number] Lindon Romp MD [id number]   . Protein S Activity 03/11/2016 99  63 - 140 % Final   Comment: (NOTE) Protein S activity may be falsely increased (masking an abnormal, low result) in patients receiving direct Xa inhibitor (e.g., rivaroxaban, apixaban, edoxaban) or a direct thrombin inhibitor (e.g., dabigatran) anticoagulant treatment due to assay interference by these drugs. Performed At: [facility] [place], [place] [id number] Lindon Romp MD [id number]   . Protein S Ag, Total 03/11/2016 149  60 - 150 % Final   Comment: (NOTE) This test was developed and its performance characteristics determined by [facility]. It has not been cleared or approved by the Food and Drug Administration. Performed At: [facility] [place], [place] [id number] Lindon Romp MD [id number]   . AntiThromb III Func 03/10/2016 104  75 - 120 % Final    Assessment:  Michael Gill is a 55 y.o. male with a history of right popliteal vein DVT discovered while hospitalized at [facility] in 10/2014.  He stopped taking Xarelto in 09/2015 secondary to cost issues.  He has had several imaging studies:   Bilateral lower extremity duplex on 11/13/2014 revealed nonocclusive thrombus in the right popliteal vein. Acuity was uncertain.  There was no evidence of deep venous thrombosis of the left lower extremity.  Right lower extremity  duplex on 10/12/2015 revealed no evidence of acute DVT within the right lower extremity.  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  Chest CT angiogram on 10/08/2015 revealed no evidence of pulmonary embolism.  Right lower extremity duplex on 03/10/2016 revealed no evidence of acute or chronic DVT within the right lower extremity.   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  He has pseudoseizures.  He has undergone an extensive work-up.  Echo in 10/2014 revealed a normal LVEF and normal right ventricular contractile performance.  He had a colonoscopy 2-3 years ago which was "ok" per patient (a few polyps).  Symptomatically, he has a pain in his right leg.    Plan: 1.  Review diagnosis of right popliteal DVT in setting of recent hospitalization (acuity unclear).  He was treated with almost a year of anticoagulation.  Discuss hypercoagulable work-up. 2.  Labs today:  CBC with diff, CMP, factor V Leiden, prothrombin gene mutation, protein C activity/antigen, protein S activity/antigen, ATIII activity/antigen, lupus anticoagulant, anticardiolipin antibodies, beta2-glycoprotein. 3.  Right lower extremity ultrasound. 4.  RTC in 2 weeks for MD assessment and review of testing.   Lequita Asal, MD  03/27/2016, 6:31 AM

## 2016-04-20 ENCOUNTER — Emergency Department: Payer: Medicare HMO

## 2016-04-20 ENCOUNTER — Encounter: Payer: Self-pay | Admitting: Emergency Medicine

## 2016-04-20 ENCOUNTER — Inpatient Hospital Stay: Payer: Medicare HMO | Attending: Hematology and Oncology | Admitting: Hematology and Oncology

## 2016-04-20 ENCOUNTER — Emergency Department
Admission: EM | Admit: 2016-04-20 | Discharge: 2016-04-20 | Disposition: A | Discharge status: Home / Self Care | Payer: Medicare HMO | Attending: Emergency Medicine | Admitting: Emergency Medicine

## 2016-04-20 ENCOUNTER — Encounter: Payer: Self-pay | Admitting: Hematology and Oncology

## 2016-04-20 VITALS — BP 130/89 | HR 80 | Temp 97.2°F | Resp 18 | Wt 252.6 lb

## 2016-04-20 DIAGNOSIS — Z79899 Other long term (current) drug therapy: Secondary | ICD-10-CM

## 2016-04-20 DIAGNOSIS — M79604 Pain in right leg: Secondary | ICD-10-CM

## 2016-04-20 DIAGNOSIS — Z8673 Personal history of transient ischemic attack (TIA), and cerebral infarction without residual deficits: Secondary | ICD-10-CM | POA: Insufficient documentation

## 2016-04-20 DIAGNOSIS — Z7982 Long term (current) use of aspirin: Secondary | ICD-10-CM | POA: Insufficient documentation

## 2016-04-20 DIAGNOSIS — Z794 Long term (current) use of insulin: Secondary | ICD-10-CM | POA: Insufficient documentation

## 2016-04-20 DIAGNOSIS — I2510 Atherosclerotic heart disease of native coronary artery without angina pectoris: Secondary | ICD-10-CM | POA: Insufficient documentation

## 2016-04-20 DIAGNOSIS — I252 Old myocardial infarction: Secondary | ICD-10-CM | POA: Diagnosis not present

## 2016-04-20 DIAGNOSIS — R0600 Dyspnea, unspecified: Secondary | ICD-10-CM

## 2016-04-20 DIAGNOSIS — I82431 Acute embolism and thrombosis of right popliteal vein: Secondary | ICD-10-CM

## 2016-04-20 DIAGNOSIS — Z85038 Personal history of other malignant neoplasm of large intestine: Secondary | ICD-10-CM | POA: Diagnosis not present

## 2016-04-20 DIAGNOSIS — Z86718 Personal history of other venous thrombosis and embolism: Secondary | ICD-10-CM | POA: Diagnosis not present

## 2016-04-20 DIAGNOSIS — N62 Hypertrophy of breast: Secondary | ICD-10-CM

## 2016-04-20 DIAGNOSIS — E785 Hyperlipidemia, unspecified: Secondary | ICD-10-CM | POA: Diagnosis not present

## 2016-04-20 DIAGNOSIS — E119 Type 2 diabetes mellitus without complications: Secondary | ICD-10-CM | POA: Diagnosis not present

## 2016-04-20 DIAGNOSIS — D6852 Prothrombin gene mutation: Secondary | ICD-10-CM | POA: Diagnosis not present

## 2016-04-20 DIAGNOSIS — R0602 Shortness of breath: Secondary | ICD-10-CM | POA: Diagnosis not present

## 2016-04-20 DIAGNOSIS — G4730 Sleep apnea, unspecified: Secondary | ICD-10-CM | POA: Insufficient documentation

## 2016-04-20 DIAGNOSIS — E538 Deficiency of other specified B group vitamins: Secondary | ICD-10-CM | POA: Insufficient documentation

## 2016-04-20 LAB — COMPREHENSIVE METABOLIC PANEL
ALK PHOS: 58 U/L (ref 38–126)
ALT: 32 U/L (ref 17–63)
AST: 22 U/L (ref 15–41)
Albumin: 3.9 g/dL (ref 3.5–5.0)
Anion gap: 4 — ABNORMAL LOW (ref 5–15)
BILIRUBIN TOTAL: 0.9 mg/dL (ref 0.3–1.2)
BUN: 13 mg/dL (ref 6–20)
CALCIUM: 9.1 mg/dL (ref 8.9–10.3)
CHLORIDE: 109 mmol/L (ref 101–111)
CO2: 23 mmol/L (ref 22–32)
CREATININE: 1.08 mg/dL (ref 0.61–1.24)
Glucose, Bld: 210 mg/dL — ABNORMAL HIGH (ref 65–99)
Potassium: 3.9 mmol/L (ref 3.5–5.1)
Sodium: 136 mmol/L (ref 135–145)
TOTAL PROTEIN: 6.6 g/dL (ref 6.5–8.1)

## 2016-04-20 LAB — CBC WITH DIFFERENTIAL/PLATELET
BASOS PCT: 0 %
Basophils Absolute: 0 10*3/uL (ref 0–0.1)
EOS ABS: 0.1 10*3/uL (ref 0–0.7)
EOS PCT: 2 %
HCT: 44.5 % (ref 40.0–52.0)
HEMOGLOBIN: 15.8 g/dL (ref 13.0–18.0)
Lymphocytes Relative: 27 %
Lymphs Abs: 2 10*3/uL (ref 1.0–3.6)
MCH: 32.1 pg (ref 26.0–34.0)
MCHC: 35.5 g/dL (ref 32.0–36.0)
MCV: 90.5 fL (ref 80.0–100.0)
MONOS PCT: 11 %
Monocytes Absolute: 0.8 10*3/uL (ref 0.2–1.0)
NEUTROS PCT: 60 %
Neutro Abs: 4.5 10*3/uL (ref 1.4–6.5)
PLATELETS: 189 10*3/uL (ref 150–440)
RBC: 4.91 MIL/uL (ref 4.40–5.90)
RDW: 13.2 % (ref 11.5–14.5)
WBC: 7.4 10*3/uL (ref 3.8–10.6)

## 2016-04-20 LAB — PROTIME-INR
INR: 0.91
PROTHROMBIN TIME: 12.2 s (ref 11.4–15.2)

## 2016-04-20 LAB — APTT: APTT: 29 s (ref 24–36)

## 2016-04-20 MED ORDER — DIPHENHYDRAMINE HCL 50 MG/ML IJ SOLN
50.0000 mg | Freq: Once | INTRAMUSCULAR | Status: AC
  Administered 2016-04-20: 50 mg via INTRAVENOUS
  Filled 2016-04-20: qty 1

## 2016-04-20 MED ORDER — IOPAMIDOL (ISOVUE-370) INJECTION 76%
75.0000 mL | Freq: Once | INTRAVENOUS | Status: AC | PRN
  Administered 2016-04-20: 75 mL via INTRAVENOUS

## 2016-04-20 NOTE — ED Triage Notes (Signed)
Pt presents to ED with c/o swelling and pain to R calf. Pt states hx of DVT in that leg. Pt also c/o intermittent SHOB, pt not noted to be [facility] during triage. Pt states was sent by the cancer center physician.

## 2016-04-20 NOTE — Progress Notes (Signed)
Patient states he has left breast swelling that is tender and sore.  Also states he has "brown spots" on the areolas of both breasts.

## 2016-04-20 NOTE — ED Notes (Signed)
Discharge instructions reviewed with patient. Questions fielded by this RN. Patient verbalizes understanding of instructions. Patient discharged home in stable condition per Stafford MD . No acute distress noted at time of discharge.  

## 2016-04-20 NOTE — ED Provider Notes (Signed)
[facility] Emergency Department Provider Note  ____________________________________________  Time seen: Approximately 5:50 PM  I have reviewed the triage vital signs and the nursing notes.   HISTORY  Chief Complaint Leg Pain and Shortness of Breath    HPI Michael Gill is a 55 y.o. male sent to the ED from oncology clinic due to right leg pain and swelling for the past 2 weeks associated with shortness of breath and chest pressure that are constant and pleuritic. No alleviating factors. Also has cough which is nonproductive. No fevers or chills. No syncope. Has a history of DVT. Has a history of colon cancer in the 1990s.     Past Medical History:  Diagnosis Date  . B12 deficiency   . CAD (coronary artery disease)    s/p MI  . Colon cancer ([place])   . CVA (cerebral infarction)   . Diabetes mellitus ([place])   . DVT (deep venous thrombosis) ([place])   . Hyperlipidemia   . Neuropathic pain, leg   . 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 Sleep apnea      Patient Active Problem List   Diagnosis Date Noted  . Deep vein thrombosis (DVT) of popliteal vein of right lower extremity ([place]) 10/30/2014  . TIA (transient ischemic attack) 01/19/2014     Past Surgical History:  Procedure Laterality Date  . APPENDECTOMY    . CARDIAC CATHETERIZATION Left 10/12/2015   Procedure: Left Heart Cath and Coronary Angiography;  Surgeon: Yolonda Kida, MD;  Location: [place];  Service: Cardiovascular;  Laterality: Left;  . TONSILLECTOMY       Prior to Admission medications   Medication Sig Start Date End Date Taking? Authorizing Provider  aspirin EC 81 MG tablet Take 81 mg by mouth every morning.    Yes Historical Provider, MD  atorvastatin (LIPITOR) 10 MG tablet Take 10 mg by mouth every evening.    Yes Historical Provider, MD  gabapentin (NEURONTIN) 300 MG capsule Take 300 mg by mouth 3 (three) times daily.   Yes Historical Provider, MD  insulin aspart protamine-  aspart (NOVOLOG MIX 70/30) (70-30) 100 UNIT/ML injection Inject 33 Units into the skin 2 (two) times daily.    Yes Historical Provider, MD  traZODone (DESYREL) 50 MG tablet Take 50-100 mg by mouth at bedtime as needed for sleep.   Yes Historical Provider, MD     Allergies Codeine; Shellfish allergy; and Sulfa antibiotics   Family History  Problem Relation Age of Onset  . Alzheimer's disease Mother   . Cancer Brother     Social History Social History  Substance Use Topics  . Smoking status: Never Smoker  . Smokeless tobacco: Never Used  . Alcohol use No    Review of Systems  Constitutional:   No fever or chills.  ENT:   No sore throat. No rhinorrhea. Cardiovascular:   Positive as above chest pain. Respiratory:   Positive shortness of breath and cough.  Musculoskeletal:   Right leg pain and Swelling 10-point ROS otherwise negative.  ____________________________________________   PHYSICAL EXAM:  VITAL SIGNS: ED Triage Vitals [04/20/16 1635]  Enc Vitals Group     BP 127/80     Pulse Rate 76     Resp 16     Temp 97.7 F (36.5 C)     Temp Source Oral     SpO2 95 %     Weight 240 lb (108.9 kg)     Height 5\' 8"  (1.727 m)  Head Circumference      Peak Flow      Pain Score 7     Pain Loc      Pain Edu?      Excl. in [place]?     Vital signs reviewed, nursing assessments reviewed.   Constitutional:   Alert and oriented. Well appearing and in no distress. Eyes:   No scleral icterus. No conjunctival pallor. PERRL. EOMI.  No nystagmus. ENT   Head:   Normocephalic and atraumatic.   Nose:   No congestion/rhinnorhea. No septal hematoma   Mouth/Throat:   MMM, no pharyngeal erythema. No peritonsillar mass.    Neck:   No stridor. No SubQ emphysema. No meningismus. Hematological/Lymphatic/Immunilogical:   No cervical lymphadenopathy. Cardiovascular:   RRR. Symmetric bilateral radial and DP pulses.  No murmurs.  Respiratory:   Normal respiratory effort  without tachypnea nor retractions. Breath sounds are clear and equal bilaterally. No wheezes/rales/rhonchi.Chest wall nontender Gastrointestinal:   Soft and nontender. Non distended. There is no CVA tenderness.  No rebound, rigidity, or guarding. Genitourinary:   deferred Musculoskeletal:   Right calf circumference enlarged, Tender. No phlegmasia Neurologic:   Normal speech and language.  CN 2-10 normal. Motor grossly intact. No gross focal neurologic deficits are appreciated.  Skin:    Skin is warm, dry and intact. No rash noted.  No petechiae, purpura, or bullae.  ____________________________________________    LABS (pertinent positives/negatives) (all labs ordered are listed, but only abnormal results are displayed) Labs Reviewed  COMPREHENSIVE METABOLIC PANEL - Abnormal; Notable for the following:       Result Value   Glucose, Bld 210 (*)    Anion gap 4 (*)    All other components within normal limits  CBC WITH DIFFERENTIAL/PLATELET  APTT  PROTIME-INR   ____________________________________________   EKG  Interpreted by me  Date: 04/20/2016  Rate: 77  Rhythm: normal sinus rhythm  QRS Axis: normal  Intervals: normal  ST/T Wave abnormalities: normal  Conduction Disutrbances: none  Narrative Interpretation: unremarkable      ____________________________________________    RADIOLOGY  Ultrasound right lower extremity negative for DVT  ____________________________________________   PROCEDURES Procedures  ____________________________________________   INITIAL IMPRESSION / ASSESSMENT AND PLAN / ED COURSE  Pertinent labs & imaging results that were available during my care of the patient were reviewed by me and considered in my medical decision making (see chart for details).  Patient presents with right calf pain and swelling and pleuritic chest pain in the setting of history of DVT and cancer. Ultrasound of the leg is negative for DVT. We'll proceed to CT  angiogram of the chest to evaluate for PE.     ----------------------------------------- 8:10 PM on 04/20/2016 -----------------------------------------  CT angiogram negative. No infiltrates. Case discussed with patient and family. Follow closely with primary care doctor for continued monitoring of symptoms.Considering the patient's symptoms, medical history, and physical examination today, I have low suspicion for ACS, PE, TAD, pneumothorax, carditis, mediastinitis, pneumonia, CHF, or sepsis.    Clinical Course   ____________________________________________   FINAL CLINICAL IMPRESSION(S) / ED DIAGNOSES  Final diagnoses:  Right leg pain  Dyspnea       Portions of this note were generated with dragon dictation software. Dictation errors may occur despite best attempts at proofreading.    Carrie Mew, MD 04/20/16 2011

## 2016-04-20 NOTE — ED Notes (Signed)
Patient transported to CT 

## 2016-04-20 NOTE — Progress Notes (Signed)
[facility] day:  04/20/16  Chief Complaint: Michael Gill is a 55 y.o. male with a DVT who is seen for review of work-up and discussion regarding direction of therapy.  HPI:  The patient was last seen in the hematology clinic on 03/10/2016 for initial consultation.  He was discovered to have a right popliteal DVT (age indeterminate) during at admission at [facility] for sepsis with aseptic meningitis, viral pneumonia with hypoxic respiratory failure.  He was on Xarelto from 10/2014 to 09/2015.  Xarelto was stopped secondary to costs.  At last visit, he complained of right leg pain.  Exam revealed edema.  Right lower extremity duplex revealed no evidence of acute or chronic DVT within the right lower extremity.  Hypercoagulable workup revealed the following normal studies:  CBC with differential, comprehensive metabolic panel,  Factor V Leiden, lupus anticoagulant panel, anticardiolipin antibodies, beta-2 glycoprotein, protein C total (96%), protein C activity (137%), protein S total (149%), protein S activity (99%), and ant-thrombin III activity (104%).    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  Symptomatically, he notes increased shortness of breath x 2-4 weeks.  He notes pain on taking a deep breath (? pleuritic) in the middle of his chest.  He notes breast changes with pain and swelling and "brown spots around the areolar".  He denies any hemoptysis.   Past Medical History:  Diagnosis Date  . B12 deficiency   . CAD (coronary artery disease)    s/p MI  . Colon cancer ([place])   . CVA (cerebral infarction)   . Diabetes mellitus ([place])   . DVT (deep venous thrombosis) ([place])   . Hyperlipidemia   . Neuropathic pain, leg   . 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 Sleep apnea     Past Surgical History:  Procedure Laterality Date  . APPENDECTOMY    . CARDIAC CATHETERIZATION Left 10/12/2015   Procedure: Left Heart Cath and  Coronary Angiography;  Surgeon: Yolonda Kida, MD;  Location: [facility];  Service: Cardiovascular;  Laterality: Left;  . TONSILLECTOMY      Family History  Problem Relation Age of Onset  . Cancer Brother   . Alzheimer's disease Mother     Social History:  reports that he has never smoked. He does not have any smokeless tobacco history on file. He reports that he does not drink alcohol or use drugs.  He is a [employer].  He is disabled.  He has been married to his wife 9 years (10 years in 10/2016).  The patient is accompanied by his wife, Butch Penny, today.  Allergies:  Allergies  Allergen Reactions  . Codeine Other (See Comments)    Reaction:  Hallucinations   . Shellfish Allergy Hives  . Sulfa Antibiotics Hives    Current Medications: Current Outpatient Prescriptions  Medication Sig Dispense Refill  . aspirin EC 81 MG tablet Take 81 mg by mouth daily.    Marland Kitchen atorvastatin (LIPITOR) 10 MG tablet Take 10 mg by mouth daily.    Marland Kitchen gabapentin (NEURONTIN) 300 MG capsule Take 300 mg by mouth 3 (three) times daily.    . insulin aspart protamine- aspart (NOVOLOG MIX 70/30) (70-30) 100 UNIT/ML injection Inject 33 Units into the skin 2 (two) times daily.     . traZODone (DESYREL) 50 MG tablet Take 50-100 mg by mouth at bedtime as needed for sleep.     No current  facility-administered medications for this visit.     Review of Systems:  GENERAL:  Feels "ok".  No fevers or sweats.  Weight up and down by 5-10 pounds. PERFORMANCE STATUS (ECOG):  3 HEENT:  No runny nose, sore throat, mouth sores or tenderness. Lungs: Shortness of breath x 2-4 weeks.  Mid sternal pain on taking a deep breath.  No cough.  No hemoptysis. Cardiac:  No chest pain, palpitations, orthopnea, or PND. GI:  No nausea, vomiting, diarrhea, constipation, melena or hematochezia. GU:  No urgency, frequency, dysuria, or hematuria. Musculoskeletal:  No back pain.  Hip pain.  No muscle  tenderness. Extremities:  No pain or swelling. Skin:  No rashes or skin changes. Neuro:  Pseudoseizures.  CVA x 6.  No numbness or weakness, balance or coordination issues. Endocrine:  Diabetes.  No thyroid issues, hot flashes or night sweats. Psych:  No mood changes, depression or anxiety. Pain:  No focal pain. Review of systems:  All other systems reviewed and found to be negative.  Physical Exam: Blood pressure 130/89, pulse 80, temperature 97.2 F (36.2 C), temperature source Tympanic, resp. rate 18, weight 252 lb 10.4 oz (114.6 kg). GENERAL:  Well developed, well nourished, gentleman sitting comfortably in the exam room in no acute distress.  He struggles to ambulate and is short of breath.. MENTAL STATUS:  Alert and oriented to person, place and time. HEAD:  Short gray hair. Male pattern baldness.  Normocephalic, atraumatic, face symmetric, no Cushingoid features. EYES:  Glasses.  Blue eyes.  Pupils equal round and reactive to light and accomodation.  No conjunctivitis or scleral icterus. ENT:  Oropharynx clear without lesion.  Tongue normal. Mucous membranes moist.  RESPIRATORY:  Clear to auscultation without rales, wheezes or rhonchi. CARDIOVASCULAR:  Regular rate and rhythm without murmur, rub or gallop. BREASTS:  Gynecomastia, tender (bilateral).  Bilateral areola tiny brown pigmentation.  Apparent fibrocystic changes on the right.  Left breast with small area of discrete firmness in upper outer quadrant.  Left breast > right breast tenderness. ABDOMEN:  Soft, non-tender, with active bowel sounds, and no appreciable hepatosplenomegaly.  No masses. SKIN:  No rashes, ulcers or lesions. EXTREMITIES: Right lower extremity edema, tender.  No skin discoloration or tenderness.  No palpable cords. LYMPH NODES: No palpable cervical, supraclavicular, axillary or inguinal adenopathy  NEUROLOGICAL: Unremarkable. PSYCH:  Appropriate.   No visits with results within 3 Day(s) from this visit.   Latest known visit with results is:  Appointment on 03/10/2016  Component Date Value Ref Range Status  . WBC 03/10/2016 8.8  3.8 - 10.6 K/uL Final  . RBC 03/10/2016 5.32  4.40 - 5.90 MIL/uL Final  . Hemoglobin 03/10/2016 16.9  13.0 - 18.0 g/dL Final  . HCT 03/10/2016 47.7  40.0 - 52.0 % Final  . MCV 03/10/2016 89.7  80.0 - 100.0 fL Final  . MCH 03/10/2016 31.7  26.0 - 34.0 pg Final  . MCHC 03/10/2016 35.3  32.0 - 36.0 g/dL Final  . RDW 03/10/2016 13.1  11.5 - 14.5 % Final  . Platelets 03/10/2016 212  150 - 440 K/uL Final  . Neutrophils Relative % 03/10/2016 70  % Final  . Neutro Abs 03/10/2016 6.1  1.4 - 6.5 K/uL Final  . Lymphocytes Relative 03/10/2016 20  % Final  . Lymphs Abs 03/10/2016 1.8  1.0 - 3.6 K/uL Final  . Monocytes Relative 03/10/2016 9  % Final  . Monocytes Absolute 03/10/2016 0.8  0.2 - 1.0 K/uL Final  . Eosinophils  Relative 03/10/2016 1  % Final  . Eosinophils Absolute 03/10/2016 0.1  0 - 0.7 K/uL Final  . Basophils Relative 03/10/2016 0  % Final  . Basophils Absolute 03/10/2016 0.0  0 - 0.1 K/uL Final  . Sodium 03/10/2016 137  135 - 145 mmol/L Final  . Potassium 03/10/2016 3.8  3.5 - 5.1 mmol/L Final  . Chloride 03/10/2016 107  101 - 111 mmol/L Final  . CO2 03/10/2016 23  22 - 32 mmol/L Final  . Glucose, Bld 03/10/2016 145* 65 - 99 mg/dL Final  . BUN 03/10/2016 14  6 - 20 mg/dL Final  . Creatinine, Ser 03/10/2016 1.09  0.61 - 1.24 mg/dL Final  . Calcium 03/10/2016 8.9  8.9 - 10.3 mg/dL Final  . Total Protein 03/10/2016 7.3  6.5 - 8.1 g/dL Final  . Albumin 03/10/2016 4.4  3.5 - 5.0 g/dL Final  . AST 03/10/2016 17  15 - 41 U/L Final  . ALT 03/10/2016 20  17 - 63 U/L Final  . Alkaline Phosphatase 03/10/2016 61  38 - 126 U/L Final  . Total Bilirubin 03/10/2016 1.0  0.3 - 1.2 mg/dL Final  . GFR calc non Af Amer 03/10/2016 >60  >60 mL/min Final  . GFR calc Af Amer 03/10/2016 >60  >60 mL/min Final   Comment: (NOTE) The eGFR has been calculated using the CKD EPI  equation. This calculation has not been validated in all clinical situations. eGFR's persistently <60 mL/min signify possible Chronic Kidney Disease.   . Anion gap 03/10/2016 7  5 - 15 Final  . Anticardiolipin IgG 03/13/2016 <9  0 - 14 GPL U/mL Final   Comment: (NOTE)                          Negative:              <15                          Indeterminate:     15 - 20                          Low-Med Positive: >20 - 80                          High Positive:         >80   . Anticardiolipin IgM 03/13/2016 <9  0 - 12 MPL U/mL Final   Comment: (NOTE)                          Negative:              <13                          Indeterminate:     13 - 20                          Low-Med Positive: >20 - 80                          High Positive:         >80   . Anticardiolipin IgA 03/13/2016 <9  0 - 11 APL U/mL Final   Comment: (NOTE)  Negative:              <12                          Indeterminate:     12 - 20                          Low-Med Positive: >20 - 80                          High Positive:         >80 Performed At: [facility] [place], [place] [id number] Lindon Romp MD GQ:[id number]   . Recommendations-F5LEID: 03/15/2016 Comment   Final   Comment: (NOTE) Result:  Negative (no mutation found) Factor V Leiden is a specific mutation (R506Q) in the factor V gene that is associated with an increased risk of venous thrombosis. Factor V Leiden is more resistant to inactivation by activated protein C.  As a result, factor V persists in the circulation leading to a mild hyper- coagulable state.  The Leiden mutation accounts for 90% - 95% of APC resistance.  Factor V Leiden has been reported in patients with deep vein thrombosis, pulmonary embolus, central retinal vein occlusion, cerebral sinus thrombosis and hepatic vein thrombosis. Other risk factors to be considered in the workup for venous thrombosis include  the G20210A mutation in the factor II (prothrombin) gene, protein S and C deficiency, and antithrombin deficiencies. Anticardiolipin antibody and lupus anticoagulant analysis may be appropriate for certain patients, as well as homocysteine levels. Contact your local [facility] for information on how to order additi                          onal testing if desired.   . Comment 03/15/2016 Comment   Corrected   Comment: (NOTE) **Genetic counselors are available for health care providers to**  discuss results at [phone] [phone]). Methodology: DNA analysis of the Factor V gene was performed by allele-specific PCR. The diagnostic sensitivity and specificity is >99% for both. Molecular-based testing is highly accurate, but as in any laboratory test, diagnostic errors may occur. All test results must be combined with clinical information for the most accurate interpretation. This test was developed and its performance characteristics determined by [facility]. It has not been cleared or approved by the Food and Drug Administration. References: Voelkerding K (1996).  Clin Lab Med [phone]. Allison Quarry, PhD, [facility] Ruben Reason, PhD, [facility] Jens Som, PhD, [facility] Annetta Maw, M.S., PhD, [facility] Alfredo Bach, PhD, [facility] Norva Riffle, PhD, [facility] Earlean Polka PhD, [facility] Performed At: [place], [place] [id number] Rema Fendt                          D QB:[id number]   . Recommendations-PTGENE: 03/15/2016 Comment*  Final   Comment: (NOTE) SINGLE G-20210-A MUTATION IDENTIFIED (HETEROZYGOTE) Comment: A point mutation (G20210A) in the factor II (prothrombin) gene is the second most common cause of inherited thrombophilia. The incidence of this mutation in the [place] Caucasian population is about 2% and in the [place] American population it is approximately 0.5%. This mutation is rare in the [place] and Native American  population. Being heterozygous for a prothrombin mutation increases the risk for developing venous thrombosis about  2 to 3 times above the general population risk. Being homozygous for the prothrombin gene mutation increases the relative risk for venous thrombosis further, although it is not yet known how much further the risk is increased. In women heterozygous for the prothrombin gene mutation, the use of estrogen containing oral contraceptives increases the relative risk of venous thrombosis about 16 times and the risk of developing cerebral thrombosis is also significantly increased. In                           pregnancy the prothrombin gene mutation increases risk for venous thrombosis and may increase risk for stillbirth, placental abruption, pre-eclampsia and fetal growth restriction. If the patient possesses two or more congenital or acquired thrombophilic risk factors, the risk for thrombosis may rise to more than the sum of the risk ratios for the individual mutations. This assay detects only the prothrombin G20210A mutation and does not measure genetic abnormalities elsewhere in the genome. Other thrombotic risk factors may be pursued through systematic clinical laboratory analysis. These factors include the R506Q (Leiden) mutation in the Factor V gene, plasma homocysteine levels, as well as testing for deficiencies of antithrombin III, protein C and protein S.   . Additional Information 03/15/2016 Comment   Final   Comment: (NOTE) Genetic Counselors are available for health care providers to discuss results at [phone] [phone]). Methodology: DNA analysis of the Factor II gene was performed by PCR amplification followed by restriction analysis. The diagnostic sensitivity is >99% for both. All the tests must be combined with clinical information for the most accurate interpretation. Molecular-based testing is highly accurate, but as in any laboratory test, diagnostic  errors may occur. This test was developed and its performance characteristics determined by [facility]. It has not been cleared or approved by the Food and Drug Administration. Poort SR, et al. Blood. 1996; 24:2353-6144. Varga EA. Circulation. 2004; 315:Q00-Q67. Mervin Hack, et [place]; 19:700-703. Allison Quarry, PhD, [facility] Ruben Reason, PhD, [facility] Jens Som, PhD, [facility] Annetta Maw, M.S., PhD, [facility] Alfredo Bach, PhD, [facility] Norva Riffle, PhD, [facility] Earlean Polka,                           PhD, [facility] Performed At: [facility] [address] [place], [place] [id number] Nechama Guard MD ZT:[id number]   . Beta-2 Glyco I IgG 03/14/2016 <9  0 - 20 GPI IgG units Final   Comment: (NOTE) The reference interval reflects a 3SD or 99th percentile interval, which is thought to represent a potentially clinically significant result in accordance with the International Consensus Statement on the classification criteria for definitive antiphospholipid syndrome (APS). J Thromb Haem 2006;4:295-306.   . Beta-2-Glycoprotein I IgM 03/14/2016 <9  0 - 32 GPI IgM units Final   Comment: (NOTE) The reference interval reflects a 3SD or 99th percentile interval, which is thought to represent a potentially clinically significant result in accordance with the International Consensus Statement on the classification criteria for definitive antiphospholipid syndrome (APS). J Thromb Haem 2006;4:295-306. Performed At: [facility] [place], [place] [id number] Lindon Romp MD [id number]   . Beta-2-Glycoprotein I IgA 03/14/2016 <9  0 - 25 GPI IgA units Final   Comment: (NOTE) The reference interval reflects a 3SD or 99th percentile interval, which is thought to represent a potentially clinically significant result in accordance with the International Consensus  Statement on the classification criteria for definitive  antiphospholipid syndrome (APS). J Thromb Haem 2006;4:295-306.   Marland Kitchen PTT Lupus Anticoagulant 03/11/2016 32.3  0.0 - 51.9 sec Final  . DRVVT 03/11/2016 39.2  0.0 - 47.0 sec Final  . Lupus Anticoag Interp 03/11/2016 Comment:   Corrected   Comment: (NOTE) No lupus anticoagulant was detected. Performed At: [facility] [place], [place] [id number] Lindon Romp MD RA:[id number]   . Protein C, Total 03/12/2016 96  60 - 150 % Final   Comment: (NOTE) Performed At: [facility] [place], [place] [id number] Lindon Romp MD [id number]   . Protein C Activity 03/11/2016 137  73 - 180 % Final   Comment: (NOTE) Performed At: [facility] [place], [place] [id number] Lindon Romp MD [id number]   . Protein S Activity 03/11/2016 99  63 - 140 % Final   Comment: (NOTE) Protein S activity may be falsely increased (masking an abnormal, low result) in patients receiving direct Xa inhibitor (e.g., rivaroxaban, apixaban, edoxaban) or a direct thrombin inhibitor (e.g., dabigatran) anticoagulant treatment due to assay interference by these drugs. Performed At: [facility] [place], [place] [id number] Lindon Romp MD [id number]   . Protein S Ag, Total 03/11/2016 149  60 - 150 % Final   Comment: (NOTE) This test was developed and its performance characteristics determined by [facility]. It has not been cleared or approved by the Food and Drug Administration. Performed At: [facility] [place], [place] [id number] Lindon Romp MD EK:[id number]   . AntiThromb III Func 03/10/2016 104  75 - 120 % Final    Assessment:  Michael Gill is a 54 y.o. male with a history of right popliteal vein DVT and prothrombin gene mutation (heterozygote point mutation G20210A) discovered while hospitalized at [facility] in 10/2014.  He stopped taking Xarelto in 09/2015 secondary to cost  issues.  He has had several imaging studies:   Bilateral lower extremity duplex on 11/13/2014 revealed nonocclusive thrombus in the right popliteal vein. Acuity was uncertain.  There was no evidence of deep venous thrombosis of the left lower extremity.  Right lower extremity duplex on 10/12/2015 revealed no evidence of acute DVT within the right lower extremity.  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  Chest CT angiogram on 10/08/2015 revealed no evidence of pulmonary embolism.  Right lower extremity duplex on 03/10/2016 revealed no evidence of acute or chronic DVT within the right lower extremity.  Hypercoagulable workup on 03/10/2016 revealed the following normal studies:  CBC with differential, comprehensive metabolic panel,  Factor V Leiden, lupus anticoagulant panel, anticardiolipin antibodies, beta-2 glycoprotein, protein C total (96%), protein C activity (137%), protein S total (149%), protein S activity (99%), and ant-thrombin III activity (104%).    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  He has pseudoseizures.  He has undergone an extensive work-up.  Echo in 10/2014 revealed a normal LVEF and normal right ventricular contractile performance.  He had a colonoscopy 2-3 years ago which was "ok" per patient (a few polyps).  Symptomatically, he notes increased shortness of breath x 2-4 weeks.  He notes pain on taking a deep breath (? pleuritic) in the middle of his chest.  He notes breast changes with pain and swelling and "  brown spots around the areolar".  Exam reveals gynecomastia  possibly related to Lipitor.  Plan: 1.  Review diagnosis of prothrombin gene mutation.  Discuss plan for life long anticoagulation if develops another clot.  Discuss analogy of boat with rocks (rocks being risk factors for thrombosis; sinking  representing a clot).  Discuss anticoagulation in at risk times. 2.  Discuss current chest discomfort.  Refer to ER.  If D-dimers elevated will need to r/o pulmonary embolism. 3.  Follow-up with Dr. Rebeca Alert regarding gynecomastia and left breast changes. 4.  Patient to ER. 5.  RTC in 3 months for MD assessment and +/- labs.  Addendum:  Chest CT angiogram was negative.  Right lower extremity duplex revealed no evidence of DVT.   Lequita Asal, MD  04/20/2016, 3:18 PM

## 2016-04-22 ENCOUNTER — Encounter: Payer: Self-pay | Admitting: Hematology and Oncology

## 2016-06-05 ENCOUNTER — Emergency Department: Payer: Medicare HMO

## 2016-06-05 ENCOUNTER — Observation Stay
Admission: EM | Admit: 2016-06-05 | Discharge: 2016-06-06 | Disposition: A | Discharge status: Home / Self Care | Payer: Medicare HMO | Attending: Internal Medicine | Admitting: Internal Medicine

## 2016-06-05 DIAGNOSIS — Z91013 Allergy to seafood: Secondary | ICD-10-CM | POA: Diagnosis not present

## 2016-06-05 DIAGNOSIS — I081 Rheumatic disorders of both mitral and tricuspid valves: Secondary | ICD-10-CM | POA: Insufficient documentation

## 2016-06-05 DIAGNOSIS — R4701 Aphasia: Secondary | ICD-10-CM | POA: Diagnosis present

## 2016-06-05 DIAGNOSIS — G459 Transient cerebral ischemic attack, unspecified: Principal | ICD-10-CM | POA: Diagnosis present

## 2016-06-05 DIAGNOSIS — Z7982 Long term (current) use of aspirin: Secondary | ICD-10-CM | POA: Diagnosis not present

## 2016-06-05 DIAGNOSIS — E119 Type 2 diabetes mellitus without complications: Secondary | ICD-10-CM | POA: Insufficient documentation

## 2016-06-05 DIAGNOSIS — I2510 Atherosclerotic heart disease of native coronary artery without angina pectoris: Secondary | ICD-10-CM | POA: Insufficient documentation

## 2016-06-05 DIAGNOSIS — M6281 Muscle weakness (generalized): Secondary | ICD-10-CM

## 2016-06-05 DIAGNOSIS — Z85038 Personal history of other malignant neoplasm of large intestine: Secondary | ICD-10-CM | POA: Insufficient documentation

## 2016-06-05 DIAGNOSIS — Z885 Allergy status to narcotic agent status: Secondary | ICD-10-CM | POA: Insufficient documentation

## 2016-06-05 DIAGNOSIS — I252 Old myocardial infarction: Secondary | ICD-10-CM | POA: Diagnosis not present

## 2016-06-05 DIAGNOSIS — E538 Deficiency of other specified B group vitamins: Secondary | ICD-10-CM | POA: Diagnosis not present

## 2016-06-05 DIAGNOSIS — R569 Unspecified convulsions: Secondary | ICD-10-CM | POA: Diagnosis not present

## 2016-06-05 DIAGNOSIS — E785 Hyperlipidemia, unspecified: Secondary | ICD-10-CM | POA: Diagnosis not present

## 2016-06-05 DIAGNOSIS — R262 Difficulty in walking, not elsewhere classified: Secondary | ICD-10-CM

## 2016-06-05 DIAGNOSIS — Z794 Long term (current) use of insulin: Secondary | ICD-10-CM | POA: Diagnosis not present

## 2016-06-05 DIAGNOSIS — Z882 Allergy status to sulfonamides status: Secondary | ICD-10-CM | POA: Insufficient documentation

## 2016-06-05 DIAGNOSIS — G4730 Sleep apnea, unspecified: Secondary | ICD-10-CM | POA: Diagnosis not present

## 2016-06-05 DIAGNOSIS — Z86718 Personal history of other venous thrombosis and embolism: Secondary | ICD-10-CM | POA: Diagnosis not present

## 2016-06-05 DIAGNOSIS — Z8673 Personal history of transient ischemic attack (TIA), and cerebral infarction without residual deficits: Secondary | ICD-10-CM | POA: Diagnosis not present

## 2016-06-05 DIAGNOSIS — E1110 Type 2 diabetes mellitus with ketoacidosis without coma: Secondary | ICD-10-CM

## 2016-06-05 LAB — URINE DRUG SCREEN, QUALITATIVE (ARMC ONLY)
Amphetamines, Ur Screen: NOT DETECTED
BARBITURATES, UR SCREEN: NOT DETECTED
Benzodiazepine, Ur Scrn: NOT DETECTED
CANNABINOID 50 NG, UR ~~LOC~~: NOT DETECTED
Cocaine Metabolite,Ur ~~LOC~~: NOT DETECTED
MDMA (ECSTASY) UR SCREEN: NOT DETECTED
METHADONE SCREEN, URINE: NOT DETECTED
Opiate, Ur Screen: NOT DETECTED
Phencyclidine (PCP) Ur S: NOT DETECTED
TRICYCLIC, UR SCREEN: NOT DETECTED

## 2016-06-05 LAB — URINALYSIS COMPLETE WITH MICROSCOPIC (ARMC ONLY)
Bacteria, UA: NONE SEEN
Bilirubin Urine: NEGATIVE
Glucose, UA: 50 mg/dL — AB
HGB URINE DIPSTICK: NEGATIVE
KETONES UR: NEGATIVE mg/dL
LEUKOCYTES UA: NEGATIVE
NITRITE: NEGATIVE
PH: 6 (ref 5.0–8.0)
PROTEIN: NEGATIVE mg/dL
RBC / HPF: NONE SEEN RBC/hpf (ref 0–5)
SPECIFIC GRAVITY, URINE: 1.004 — AB (ref 1.005–1.030)
Squamous Epithelial / LPF: NONE SEEN
WBC UA: NONE SEEN WBC/hpf (ref 0–5)

## 2016-06-05 LAB — DIFFERENTIAL
BASOS ABS: 0 10*3/uL (ref 0–0.1)
BASOS PCT: 0 %
EOS ABS: 0.2 10*3/uL (ref 0–0.7)
EOS PCT: 3 %
Lymphocytes Relative: 28 %
Lymphs Abs: 2.3 10*3/uL (ref 1.0–3.6)
MONOS PCT: 9 %
Monocytes Absolute: 0.8 10*3/uL (ref 0.2–1.0)
NEUTROS PCT: 60 %
Neutro Abs: 5.1 10*3/uL (ref 1.4–6.5)

## 2016-06-05 LAB — CBC
HCT: 46.7 % (ref 40.0–52.0)
Hemoglobin: 16.1 g/dL (ref 13.0–18.0)
MCH: 31.6 pg (ref 26.0–34.0)
MCHC: 34.4 g/dL (ref 32.0–36.0)
MCV: 91.9 fL (ref 80.0–100.0)
Platelets: 173 10*3/uL (ref 150–440)
RBC: 5.09 MIL/uL (ref 4.40–5.90)
RDW: 13.1 % (ref 11.5–14.5)
WBC: 8.5 10*3/uL (ref 3.8–10.6)

## 2016-06-05 LAB — COMPREHENSIVE METABOLIC PANEL
ALT: 21 U/L (ref 17–63)
ANION GAP: 10 (ref 5–15)
AST: 26 U/L (ref 15–41)
Albumin: 3.8 g/dL (ref 3.5–5.0)
Alkaline Phosphatase: 57 U/L (ref 38–126)
BUN: 12 mg/dL (ref 6–20)
CHLORIDE: 107 mmol/L (ref 101–111)
CO2: 21 mmol/L — ABNORMAL LOW (ref 22–32)
Calcium: 9.1 mg/dL (ref 8.9–10.3)
Creatinine, Ser: 1.07 mg/dL (ref 0.61–1.24)
GFR calc Af Amer: >60 mL/min (ref >60)
Glucose, Bld: 170 mg/dL — ABNORMAL HIGH (ref 65–99)
POTASSIUM: 4.3 mmol/L (ref 3.5–5.1)
Sodium: 138 mmol/L (ref 135–145)
TOTAL PROTEIN: 6.7 g/dL (ref 6.5–8.1)
Total Bilirubin: 0.5 mg/dL (ref 0.3–1.2)

## 2016-06-05 LAB — APTT: APTT: 27 s (ref 24–36)

## 2016-06-05 LAB — GLUCOSE, CAPILLARY
GLUCOSE-CAPILLARY: 165 mg/dL — AB (ref 65–99)
Glucose-Capillary: 152 mg/dL — ABNORMAL HIGH (ref 65–99)

## 2016-06-05 LAB — PROTIME-INR
INR: 0.92
Prothrombin Time: 12.3 seconds (ref 11.4–15.2)

## 2016-06-05 MED ORDER — TRAZODONE HCL 50 MG PO TABS: 50.0000 mg | ORAL_TABLET | Freq: Every evening | ORAL | Status: DC | PRN

## 2016-06-05 MED ORDER — INSULIN ASPART PROT & ASPART (70-30 MIX) 100 UNIT/ML ~~LOC~~ SUSP
33.0000 [IU] | Freq: Two times a day (BID) | SUBCUTANEOUS | Status: DC
  Administered 2016-06-06: 33 [IU] via SUBCUTANEOUS
  Filled 2016-06-05: qty 33

## 2016-06-05 MED ORDER — GABAPENTIN 100 MG PO CAPS
100.0000 mg | ORAL_CAPSULE | Freq: Three times a day (TID) | ORAL | Status: DC
  Administered 2016-06-06: 11:00:00 100 mg via ORAL
  Filled 2016-06-05: qty 1

## 2016-06-05 MED ORDER — ACETAMINOPHEN 325 MG PO TABS: 650.0000 mg | ORAL_TABLET | Freq: Four times a day (QID) | ORAL | Status: DC | PRN

## 2016-06-05 MED ORDER — INSULIN ASPART 100 UNIT/ML ~~LOC~~ SOLN
0.0000 [IU] | Freq: Three times a day (TID) | SUBCUTANEOUS | Status: DC
  Administered 2016-06-06: 12:00:00 2 [IU] via SUBCUTANEOUS
  Administered 2016-06-06: 1 [IU] via SUBCUTANEOUS
  Filled 2016-06-05: qty 1
  Filled 2016-06-05: qty 2

## 2016-06-05 MED ORDER — GABAPENTIN 300 MG PO CAPS: 300.0000 mg | ORAL_CAPSULE | Freq: Three times a day (TID) | ORAL | Status: DC

## 2016-06-05 MED ORDER — STROKE: EARLY STAGES OF RECOVERY BOOK
Freq: Once | Status: AC
  Administered 2016-06-05: 21:00:00

## 2016-06-05 MED ORDER — ASPIRIN EC 81 MG PO TBEC
81.0000 mg | DELAYED_RELEASE_TABLET | ORAL | Status: DC
  Administered 2016-06-06: 06:00:00 81 mg via ORAL
  Filled 2016-06-05: qty 1

## 2016-06-05 MED ORDER — ATORVASTATIN CALCIUM 20 MG PO TABS: 10.0000 mg | ORAL_TABLET | Freq: Every evening | ORAL | Status: DC

## 2016-06-05 MED ORDER — ENOXAPARIN SODIUM 40 MG/0.4ML ~~LOC~~ SOLN
40.0000 mg | SUBCUTANEOUS | Status: DC
  Administered 2016-06-05: 40 mg via SUBCUTANEOUS
  Filled 2016-06-05: qty 0.4

## 2016-06-05 MED ORDER — INSULIN ASPART PROT & ASPART (70-30 MIX) 100 UNIT/ML ~~LOC~~ SUSP
33.0000 [IU] | Freq: Two times a day (BID) | SUBCUTANEOUS | Status: DC
  Filled 2016-06-05: qty 33

## 2016-06-05 MED ORDER — OXYCODONE-ACETAMINOPHEN 5-325 MG PO TABS
1.0000 | ORAL_TABLET | Freq: Four times a day (QID) | ORAL | Status: DC | PRN
Start: 2016-06-05 — End: 2016-06-06
  Administered 2016-06-05: 23:00:00 1 via ORAL
  Administered 2016-06-06: 12:00:00 2 via ORAL
  Filled 2016-06-05: qty 2
  Filled 2016-06-05: qty 1

## 2016-06-05 NOTE — ED Notes (Signed)
[facility]  DONE  REPORT  GIVEN  TO  DR  Marcelene Butte MD

## 2016-06-05 NOTE — ED Triage Notes (Signed)
Pt presents with R sided weakenss x 15 minutes. L sided facial droop. LKW 15 minutes PTA. Pt stuttering, but alert to name and DOB. Pt leaning toward L. Pt able to follow commands. Points to entire R side when asked about pain.

## 2016-06-05 NOTE — ED Provider Notes (Signed)
Time Seen: Approximately 1650  I have reviewed the triage notes  Chief Complaint: Code Stroke   History of Present Illness: Michael Gill is a 55 y.o. male who presents as a code stroke. 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 Does have a history of previous cerebral vascular infarct along with coronary artery disease. His wife states he stopped speaking there was no obvious facial droop. Patient stopped moving the right side of his body. Patient will not verbalize his complaints at this point and it sounds as though he is having more right-sided pain as opposed to weakness. She was seen by the specialist on call neurology who agrees that he doesn't feel that this is a code stroke at this time.   Past Medical History:  Diagnosis Date  . B12 deficiency   . CAD (coronary artery disease)    s/p MI  . Colon cancer ([place])   . CVA (cerebral infarction)   . Diabetes mellitus ([place])   . DVT (deep venous thrombosis) ([place])   . Hyperlipidemia   . Neuropathic pain, leg   . Pseudoseizure   . Sleep apnea     Patient Active Problem List   Diagnosis Date Noted  . Deep vein thrombosis (DVT) of popliteal vein of right lower extremity ([place]) 10/30/2014  . TIA (transient ischemic attack) 01/19/2014    Past Surgical History:  Procedure Laterality Date  . APPENDECTOMY    . CARDIAC CATHETERIZATION Left 10/12/2015   Procedure: Left Heart Cath and Coronary Angiography;  Surgeon: Yolonda Kida, MD;  Location: [facility];  Service: Cardiovascular;  Laterality: Left;  . TONSILLECTOMY      Past Surgical History:  Procedure Laterality Date  . APPENDECTOMY    . CARDIAC CATHETERIZATION Left 10/12/2015   Procedure: Left Heart Cath and Coronary Angiography;  Surgeon: Yolonda Kida, MD;  Location: [facility];  Service: Cardiovascular;  Laterality: Left;  . TONSILLECTOMY        Allergies:  Codeine; Shellfish allergy; and Sulfa antibiotics  Family  History: Family History  Problem Relation Age of Onset  . Alzheimer's disease Mother   . Cancer Brother     Social History: Social History  Substance Use Topics  . Smoking status: Never Smoker  . Smokeless tobacco: Never Used  . Alcohol use No     Review of Systems:   10 point review of systems was performed and was otherwise negative: Review of systems was acquired from the wife as patient's currently nonverbal Constitutional: No fever Eyes: No visual disturbances ENT: No sore throat, ear pain Cardiac: No chest pain Respiratory: No shortness of breath, wheezing, or stridor Abdomen: No abdominal pain, no vomiting, No diarrhea Endocrine: No weight loss, No night sweats Extremities: No peripheral edema, cyanosis Skin: No rashes, easy bruising Neurologic: She states his right lower extremities seemed to "" gets this "". Urologic: No dysuria, Hematuria, or urinary frequency   Physical Exam:  ED Triage Vitals  Enc Vitals Group     BP 06/05/16 1640 (!) 142/92     Pulse Rate 06/05/16 1640 (!) 101     Resp 06/05/16 1640 20     Temp 06/05/16 1640 97.9 F (36.6 C)     Temp Source 06/05/16 1640 Oral     SpO2 06/05/16 1640 98 %     Weight 06/05/16 2035 246 lb 5 oz (111.7 kg)     Height 06/05/16 2035 5\' 8"  (1.727 m)  Head Circumference --      Peak Flow --      Pain Score 06/05/16 1802 7     Pain Loc --      Pain Edu? --      Excl. in [place]? --     General: Awake , And arousable. There is no focal weakness of his face still the does not seem to want to speak. He will open his eyes on command. Head: Normal cephalic , atraumatic Eyes: Pupils equal , round, reactive to light Nose/Throat: No nasal drainage, patent upper airway without erythema or exudate.  Neck: Supple, Full range of motion, No anterior adenopathy or palpable thyroid masses Lungs: Clear to ascultation without wheezes , rhonchi, or rales Heart: Regular rate, regular rhythm without murmurs , gallops , or  rubs Abdomen: Soft, non tender without rebound, guarding , or rigidity; bowel sounds positive and symmetric in all 4 quadrants. No organomegaly .        Extremities: 2 plus symmetric pulses. No edema, clubbing or cyanosis Neurologic: Patient has bilateral downgoing Babinski. He will not attempt to squeeze her hand or wiggle his toes on command in either lower extremity or upper extremities Skin: warm, dry, no rashes   Labs:   All laboratory work was reviewed including any pertinent negatives or positives listed below:  Labs Reviewed  COMPREHENSIVE METABOLIC PANEL - Abnormal; Notable for the following:       Result Value   CO2 21 (*)    Glucose, Bld 170 (*)    All other components within normal limits  URINALYSIS COMPLETEWITH MICROSCOPIC ([facility] ONLY) - Abnormal; Notable for the following:    Color, Urine STRAW (*)    APPearance CLEAR (*)    Glucose, UA 50 (*)    Specific Gravity, Urine 1.004 (*)    All other components within normal limits  GLUCOSE, CAPILLARY - Abnormal; Notable for the following:    Glucose-Capillary 165 (*)    All other components within normal limits  GLUCOSE, CAPILLARY - Abnormal; Notable for the following:    Glucose-Capillary 152 (*)    All other components within normal limits  PROTIME-INR  APTT  CBC  DIFFERENTIAL  URINE DRUG SCREEN, QUALITATIVE ([facility] ONLY)  HEMOGLOBIN A1C  LIPID PANEL  I-STAT TROPOININ, ED    EKG:  ED ECG REPORT I, Daymon Larsen, the attending physician, personally viewed and interpreted this ECG.  Date: 06/05/2016 EKG Time: 1703 Rate: 82 Rhythm: normal sinus rhythm QRS Axis: normal Intervals: normal ST/T Wave abnormalities: normal Conduction Disturbances: none Narrative Interpretation: unremarkable No acute ischemic changes  Radiology:  "Ct Head Wo Contrast  Result Date: 06/05/2016 CLINICAL DATA:  Right arm weakness.  Code stroke. EXAM: CT HEAD WITHOUT CONTRAST TECHNIQUE: Contiguous axial images were obtained from the  base of the skull through the vertex without intravenous contrast. COMPARISON:  06/09/2015 FINDINGS: Brain: No evidence of acute infarction, hemorrhage, hydrocephalus, extra-axial collection or mass lesion/mass effect. Vascular: No hyperdense vessel or unexpected calcification. Skull: Normal. Negative for fracture or focal lesion. Sinuses/Orbits: No acute finding. Other: None. IMPRESSION: 1. Normal unenhanced CT scan of the brain. These results were called by telephone at the time of interpretation on 06/05/2016 at 4:59 pm to Nurse Jeannett Senior, who verbally acknowledged these results. Electronically Signed   By: Lajean Manes M.D.   On: 06/05/2016 17:01  "  I personally reviewed the radiologic studies    ED Course: * The neurologist and I both agree that is difficult to make  the patient a TPA candidate based on his current clinical presentation. The patient will be continued to be evaluated for neurologic emergency. I reviewed the case with the hospitalist, further disposition and management is per their evaluation with likely neurology consultation Clinical Course      Assessment: aphasia   Final Clinical Impression:   Final diagnoses:  Aphasia     Plan:  Inpatient management           Daymon Larsen, MD 06/05/16 2253

## 2016-06-05 NOTE — ED Notes (Signed)
Pt. Taken to stat CT.

## 2016-06-05 NOTE — ED Notes (Signed)
Code  Stroke  Called  To 333 

## 2016-06-05 NOTE — ED Notes (Signed)
Soc  called

## 2016-06-05 NOTE — H&P (Signed)
Michael Gill at [place] NAME: Michael Gill    MR#:  [id number]  DATE OF BIRTH:  08/08/1960  DATE OF ADMISSION:  06/05/2016  PRIMARY CARE PHYSICIAN: Letta Median, MD   REQUESTING/REFERRING PHYSICIAN:  Derrek Gu MD  CHIEF COMPLAINT:   Chief Complaint  Patient presents with  . Code Stroke    HISTORY OF PRESENT ILLNESS: Michael Gill  is a 55 y.o. male with a known history of B12 deficiency, coronary artery disease, reported history of CVA, pseudoseizures and untreated sleep apnea who had similar type episode in May 2017 was seen at [facility] and was discharged from the ER. After being seen by neurology who felt that patient had a conversional disorder. Patient today was at [facility] and according to his wife he started feeling  " weird" and stated that he wanted to go to the hospital. Patient also started having right-sided weakness in his legs and states that he also started having bilateral facial numbness. He also states that he started having pain in that right leg. He is also complaining of headache. In the ER CT scan of the head is negative. PAST MEDICAL HISTORY:   Past Medical History:  Diagnosis Date  . B12 deficiency   . CAD (coronary artery disease)    s/p MI  . Colon cancer ([place])   . CVA (cerebral infarction)   . Diabetes mellitus ([place])   . DVT (deep venous thrombosis) ([place])   . Hyperlipidemia   . Neuropathic pain, leg   . Pseudoseizure   . Sleep apnea     PAST SURGICAL HISTORY: Past Surgical History:  Procedure Laterality Date  . APPENDECTOMY    . CARDIAC CATHETERIZATION Left 10/12/2015   Procedure: Left Heart Cath and Coronary Angiography;  Surgeon: Yolonda Kida, MD;  Location: [place] [facility];  Service: Cardiovascular;  Laterality: Left;  . TONSILLECTOMY      SOCIAL HISTORY:  Social History  Substance Use Topics  . Smoking status: Never Smoker  . Smokeless tobacco: Never Used  . Alcohol use No    FAMILY  HISTORY:  Family History  Problem Relation Age of Onset  . Alzheimer's disease Mother   . Cancer Brother     DRUG ALLERGIES:  Allergies  Allergen Reactions  . Codeine Other (See Comments)    Reaction:  Hallucinations   . Shellfish Allergy Hives  . Sulfa Antibiotics Hives    REVIEW OF SYSTEMS:   CONSTITUTIONAL: No fever, fatigue or weakness.  EYES: No blurred or double vision.  EARS, NOSE, AND THROAT: No tinnitus or ear pain.  RESPIRATORY: No cough, shortness of breath, wheezing or hemoptysis.  CARDIOVASCULAR: No chest pain, orthopnea, edema.  GASTROINTESTINAL: No nausea, vomiting, diarrhea or abdominal pain.  GENITOURINARY: No dysuria, hematuria.  ENDOCRINE: No polyuria, nocturia,  HEMATOLOGY: No anemia, easy bruising or bleeding SKIN: No rash or lesion. MUSCULOSKELETAL: No joint pain or arthritis.   NEUROLOGIC: Complains of right upper extremity and right lower extremity weakness bilateral facial numbness complains of headache  PSYCHIATRY: No anxiety or depression.   MEDICATIONS AT HOME:  Prior to Admission medications   Medication Sig Start Date End Date Taking? Authorizing Provider  aspirin EC 81 MG tablet Take 81 mg by mouth every morning.    Yes Historical Provider, MD  atorvastatin (LIPITOR) 10 MG tablet Take 10 mg by mouth every evening.    Yes Historical Provider, MD  gabapentin (NEURONTIN) 300 MG capsule Take 300 mg by mouth  3 (three) times daily.   Yes Historical Provider, MD  insulin aspart protamine- aspart (NOVOLOG MIX 70/30) (70-30) 100 UNIT/ML injection Inject 33 Units into the skin 2 (two) times daily.    Yes Historical Provider, MD  traZODone (DESYREL) 50 MG tablet Take 50-100 mg by mouth at bedtime as needed for sleep.   Yes Historical Provider, MD      PHYSICAL EXAMINATION:   VITAL SIGNS: Blood pressure 136/81, pulse (!) 101, temperature 97.9 F (36.6 C), temperature source Oral, resp. rate 16, SpO2 98 %.  GENERAL:  55 y.o.-year-old patient lying in  the bed with no acute distress.  EYES: Pupils equal, round, reactive to light and accommodation. No scleral icterus. Extraocular muscles intact.  HEENT: Head atraumatic, normocephalic. Oropharynx and nasopharynx clear.  NECK:  Supple, no jugular venous distention. No thyroid enlargement, no tenderness.  LUNGS: Normal breath sounds bilaterally, no wheezing, rales,rhonchi or crepitation. No use of accessory muscles of respiration.  CARDIOVASCULAR: S1, S2 normal. No murmurs, rubs, or gallops.  ABDOMEN: Soft, nontender, nondistended. Bowel sounds present. No organomegaly or mass.  EXTREMITIES: No pedal edema, cyanosis, or clubbing.  NEUROLOGIC: Cranial nerves II through XII are intact. Muscle strength 4/5 in Right upper extremity and right lower extremity. Sensation intact. Gait not checked.  PSYCHIATRIC: The patient is alert and oriented x 3. Appears anxious SKIN: No obvious rash, lesion, or ulcer.   LABORATORY PANEL:   CBC  Recent Labs Lab 06/05/16 1706  WBC 8.5  HGB 16.1  HCT 46.7  PLT 173  MCV 91.9  MCH 31.6  MCHC 34.4  RDW 13.1  LYMPHSABS 2.3  MONOABS 0.8  EOSABS 0.2  BASOSABS 0.0   ------------------------------------------------------------------------------------------------------------------  Chemistries   Recent Labs Lab 06/05/16 1706  NA 138  K 4.3  CL 107  CO2 21*  GLUCOSE 170*  BUN 12  CREATININE 1.07  CALCIUM 9.1  AST 26  ALT 21  ALKPHOS 57  BILITOT 0.5   ------------------------------------------------------------------------------------------------------------------ CrCl cannot be calculated (Unknown ideal weight.). ------------------------------------------------------------------------------------------------------------------ No results for input(s): TSH, T4TOTAL, T3FREE, THYROIDAB in the last 72 hours.  Invalid input(s): FREET3   Coagulation profile  Recent Labs Lab 06/05/16 1706  INR 0.92    ------------------------------------------------------------------------------------------------------------------- No results for input(s): DDIMER in the last 72 hours. -------------------------------------------------------------------------------------------------------------------  Cardiac Enzymes No results for input(s): CKMB, TROPONINI, MYOGLOBIN in the last 168 hours.  Invalid input(s): CK ------------------------------------------------------------------------------------------------------------------ Invalid input(s): POCBNP  ---------------------------------------------------------------------------------------------------------------  Urinalysis    Component Value Date/Time   COLORURINE STRAW (A) 06/05/2016 1706   APPEARANCEUR CLEAR (A) 06/05/2016 1706   APPEARANCEUR Hazy 11/01/2014 1413   LABSPEC 1.004 (L) 06/05/2016 1706   LABSPEC 1.012 11/01/2014 1413   PHURINE 6.0 06/05/2016 1706   GLUCOSEU 50 (A) 06/05/2016 1706   GLUCOSEU 150 mg/dL 11/01/2014 1413   HGBUR NEGATIVE 06/05/2016 1706   BILIRUBINUR NEGATIVE 06/05/2016 1706   BILIRUBINUR Negative 11/01/2014 1413   KETONESUR NEGATIVE 06/05/2016 1706   PROTEINUR NEGATIVE 06/05/2016 1706   NITRITE NEGATIVE 06/05/2016 1706   LEUKOCYTESUR NEGATIVE 06/05/2016 1706   LEUKOCYTESUR Negative 11/01/2014 1413     RADIOLOGY: Ct Head Wo Contrast  Result Date: 06/05/2016 CLINICAL DATA:  Right arm weakness.  Code stroke. EXAM: CT HEAD WITHOUT CONTRAST TECHNIQUE: Contiguous axial images were obtained from the base of the skull through the vertex without intravenous contrast. COMPARISON:  06/09/2015 FINDINGS: Brain: No evidence of acute infarction, hemorrhage, hydrocephalus, extra-axial collection or mass lesion/mass effect. Vascular: No hyperdense vessel or unexpected calcification. Skull: Normal. Negative for fracture  or focal lesion. Sinuses/Orbits: No acute finding. Other: None. IMPRESSION: 1. Normal unenhanced CT scan of the  brain. These results were called by telephone at the time of interpretation on 06/05/2016 at 4:59 pm to Nurse Jeannett Senior, who verbally acknowledged these results. Electronically Signed   By: Lajean Manes M.D.   On: 06/05/2016 17:01    EKG: Orders placed or performed during the hospital encounter of 06/05/16  . ED EKG  . ED EKG    IMPRESSION AND PLAN: Patient is a 55 year old white male presenting with right sided weakness  1. Right-sided weakness I have low suspicion for CVA his symptoms are not consistent I will place under observation obtain MRI of the brain, carotid Dopplers and echocardiogram of the heart. Continue aspirin  2. Diabetes type 2 I will place patient on sliding scale insulin continue 7030 insulin  3. Hyperlipidemia continue Lipitor  4. History of pseudoseizure Continue gabapentin and trazodone  5. Miscellaneous Lovenox for DVT prophylaxis   All the records are reviewed and case discussed with ED provider. Management plans discussed with the patient, family and they are in agreement.  CODE STATUS: Code Status History    This patient does not have a recorded code status. Please follow your organizational policy for patients in this situation.       TOTAL TIME TAKING CARE OF THIS PATIENT: 55 minutes.    Dustin Flock M.D on 06/05/2016 at 6:17 PM  Between 7am to 6pm - Pager - [phone]  After 6pm go to [email or web address] - password [email or web address] [place] [facility]  Office  [phone]  CC: Primary care physician; Letta Median, MD

## 2016-06-05 NOTE — Progress Notes (Signed)
[place] responded to a pager to visit a Pt a code blue Pt in the ED Rm 24. When the [facility] arrived, the Pt had been take to CT Scan, [place] talked to Pt's wife who was very anxious and calmed her. Pt returned, he was alert, responsive, but could not verbalized a word. [place] left to visit with another Pt, and returned to visit with the Pt again at 5:41pm. Pt recognized the [facility] voice, opened his eyes, and talked with both the [facility] and his wife. Pt wanted to know if [facility] is spirit filled; [place] answered yes, and then Pt requested for prayers for himself and his family, which the [facility], provided.    06/05/16 1900  Clinical Encounter Type  Visited With Patient;Patient and family together  Visit Type Initial;Spiritual support;Code;ED  Referral From Nurse  Consult/Referral To Nurse  Spiritual Encounters  Spiritual Needs Prayer;Other (Comment)  Stress Factors  Patient Stress Factors None identified  Family Stress Factors Other (Comment)

## 2016-06-06 ENCOUNTER — Observation Stay: Payer: Medicare HMO

## 2016-06-06 ENCOUNTER — Observation Stay
Admit: 2016-06-06 | Discharge: 2016-06-06 | Disposition: A | Discharge status: Home / Self Care | Payer: Medicare HMO | Attending: Internal Medicine | Admitting: Internal Medicine

## 2016-06-06 LAB — LIPID PANEL
Cholesterol: 125 mg/dL (ref 0–200)
HDL: 37 mg/dL — AB (ref >40)
LDL Cholesterol: 65 mg/dL (ref 0–99)
Total CHOL/HDL Ratio: 3.4 RATIO
Triglycerides: 114 mg/dL (ref <150)
VLDL: 23 mg/dL (ref 0–40)

## 2016-06-06 LAB — GLUCOSE, CAPILLARY
GLUCOSE-CAPILLARY: 145 mg/dL — AB (ref 65–99)
GLUCOSE-CAPILLARY: 177 mg/dL — AB (ref 65–99)

## 2016-06-06 LAB — ECHOCARDIOGRAM COMPLETE
Height: 68 in
WEIGHTICAEL: 3941 [oz_av]

## 2016-06-06 NOTE — Evaluation (Signed)
Occupational Therapy Evaluation Patient Details Name: Michael Gill MRN: [id number] DOB: Jan 01, 1961 Today's Date: 06/06/2016    History of Present Illness Pt. is a 55 y.o. male who was admitted to [facility] with right sided weakness.   Clinical Impression   Pt. Is a 55 y.o. Male who was admitted to [facility] with right sided weakness. Pt. Is now able to use his LUE and hand for ADL tasks. Pt. Grip strength: R: 30#, L: 40#. Pt. could benefit from. Pt. Reports a tremor in the left hand. Pt. sensation to light touch, and proprioceptive awareness are all intact. No further OT services are warranted at this time at this level of care. Pt. could benefit from follow-up OT services for a home assessment, pt. Education in home modification, and DME.    Follow Up Recommendations  Home health OT    Equipment Recommendations       Recommendations for Other Services                                                    ADL Overall ADL's : Needs assistance/impaired Eating/Feeding: Set up   Grooming: Set up           Upper Body Dressing : Minimal assistance;Set up                     General ADL Comments: Pt. is able to use his bilateral UE's for use during ADLs.     Vision     Perception     Praxis      Pertinent Vitals/Pain Pain Assessment: No/denies pain     Hand Dominance Right   Extremity/Trunk Assessment Upper Extremity Assessment Upper Extremity Assessment: Generalized weakness           Communication Communication Communication: No difficulties   Cognition Arousal/Alertness: Awake/alert Behavior During Therapy: WFL for tasks assessed/performed Overall Cognitive Status: Within Functional Limits for tasks assessed                     General Comments       Exercises       Shoulder Instructions      Home Living Family/patient expects to be discharged to:: Private residence Living Arrangements: Spouse/significant  other Available Help at Discharge: Family Type of Home: House Home Access: Ramped entrance     Home Layout: One level     Bathroom Shower/Tub: Tub/shower unit;Curtain Shower/tub characteristics: Curtain       Home Equipment: Environmental consultant - 2 wheels          Prior Functioning/Environment Level of Independence: Independent                 OT Problem List: Decreased strength;Pain;Decreased activity tolerance   OT Treatment/Interventions:      OT Goals(Current goals can be found in the care plan section)    OT Frequency:     Barriers to D/C:            Co-evaluation              End of Session    Activity Tolerance: Patient tolerated treatment well Patient left: with call bell/phone within reach   Time: 1120-1145 OT Time Calculation (min): 25 min Charges:  OT General Charges $OT Visit: 1 Procedure OT Evaluation $OT Eval Moderate Complexity: 1 Procedure  G-Codes:    Harrel Carina, MS, OTR/L 06/06/2016, 2:12 PM

## 2016-06-06 NOTE — Evaluation (Signed)
Physical Therapy Evaluation Patient Details Name: Michael Gill MRN: [id number] DOB: 01-Jul-1961 Today's Date: 06/06/2016   History of Present Illness  Pt. is a 55 y.o. male who was admitted to [facility] with right sided weakness.  CT and MRI negative for CVA.  Clinical Impression  Pt did well with PT and he and his wife report that he feels he is at his baseline. Overall pt did well with mobility and ambulation and ultimately he did well with all acts.  He has mild baseline R LE pain/limp that appears to be unchanged.  Pt should be safe to return home w/o further PT intervention.     Follow Up Recommendations No PT follow up    Equipment Recommendations       Recommendations for Other Services       Precautions / Restrictions Precautions Precautions: Fall Restrictions Weight Bearing Restrictions: No      Mobility  Bed Mobility Overal bed mobility: Independent                Transfers Overall transfer level: Independent Equipment used: Rolling walker (2 wheeled) (Pt able to rise and maintain balance w/o AD)             General transfer comment: Pt with good confidence and balance in getting to standing  Ambulation/Gait Ambulation/Gait assistance: Modified independent (Device/Increase time) Ambulation Distance (Feet): 200 Feet Assistive device: Rolling walker (2 wheeled)       General Gait Details: Pt able to maintain consistent though slow cadence.  He has very minimal R LE limp that is apparently baseline, no LOBs or other safety issues.   Stairs            Wheelchair Mobility    Modified Rankin (Stroke Patients Only)       Balance Overall balance assessment: Modified Independent                                           Pertinent Vitals/Pain Pain Assessment: No/denies pain    Home Living Family/patient expects to be discharged to:: Private residence Living Arrangements: Spouse/significant other Available Help at Discharge:  Family Type of Home: House Home Access: Ramped entrance     Home Layout: One level Home Equipment: Environmental consultant - 2 wheels      Prior Function Level of Independence: Independent               Hand Dominance   Dominant Hand: Right    Extremity/Trunk Assessment   Upper Extremity Assessment: Defer to OT evaluation           Lower Extremity Assessment: Overall WFL for tasks assessed         Communication   Communication: No difficulties  Cognition Arousal/Alertness: Awake/alert Behavior During Therapy: WFL for tasks assessed/performed Overall Cognitive Status: Within Functional Limits for tasks assessed                      General Comments      Exercises     Assessment/Plan    PT Assessment Patent does not need any further PT services  PT Problem List            PT Treatment Interventions      PT Goals (Current goals can be found in the Care Plan section)  Acute Rehab PT Goals Patient Stated Goal: go home soon  Frequency     Barriers to discharge        Co-evaluation               End of Session Equipment Utilized During Treatment: Gait belt Activity Tolerance: Patient tolerated treatment well Patient left: with bed alarm set;with call bell/phone within reach      Functional Assessment Tool Used: clinical judgement Functional Limitation: Mobility: Walking and moving around Mobility: Walking and Moving Around Current Status [id number]): At least 1 percent but less than 20 percent impaired, limited or restricted Mobility: Walking and Moving Around Goal Status [id number]): At least 1 percent but less than 20 percent impaired, limited or restricted Mobility: Walking and Moving Around Discharge Status [phone]): At least 1 percent but less than 20 percent impaired, limited or restricted    Time: 1400-1422 PT Time Calculation (min) (ACUTE ONLY): 22 min   Charges:   PT Evaluation $PT Eval Low Complexity: 1 Procedure     PT G Codes:    PT G-Codes **NOT FOR INPATIENT CLASS** Functional Assessment Tool Used: clinical judgement Functional Limitation: Mobility: Walking and moving around Mobility: Walking and Moving Around Current Status [id number]): At least 1 percent but less than 20 percent impaired, limited or restricted Mobility: Walking and Moving Around Goal Status [phone]): At least 1 percent but less than 20 percent impaired, limited or restricted Mobility: Walking and Moving Around Discharge Status [phone]): At least 1 percent but less than 20 percent impaired, limited or restricted    Kreg Shropshire, DPT 06/06/2016, 3:50 PM

## 2016-06-06 NOTE — Care Management Obs Status (Signed)
[place] NOTIFICATION   Patient Details  Name: Michael Gill MRN: [id number] Date of Birth: [birth year]   Medicare Observation Status Notification Given:  Yes    Shelbie Ammons, RN 06/06/2016, 1:39 PM

## 2016-06-06 NOTE — Progress Notes (Signed)
Patient discharged home per MD order. All discharge instructions given and all questions answered. 

## 2016-06-06 NOTE — Progress Notes (Signed)
While rounding, [place] made initial visit to room 103. Pt was enjoying breakfast. Wife was bedside. Pt was in good spirits and spoke highly of the on-call [facility] who ministered with them last evening. CH provided prayer and a blessing for the meal. CH is available for follow up as needed.    06/06/16 1200  Clinical Encounter Type  Visited With Patient;Patient and family together  Visit Type Initial;Spiritual support  Referral From Nurse  Spiritual Encounters  Spiritual Needs Prayer

## 2016-06-06 NOTE — Progress Notes (Signed)
*  PRELIMINARY RESULTS* Echocardiogram 2D Echocardiogram has been performed.  Michael Gill 06/06/2016, 7:59 AM

## 2016-06-06 NOTE — Progress Notes (Signed)
SLP Cancellation Note  Patient Details Name: Michael Gill MRN: [id number] DOB: 10/21/1960   Cancelled treatment:       Reason Eval/Treat Not Completed: SLP screened, no needs identified, will sign off (in the area of Swallowing/Dysphagia per NSG report). Tolerating po's and swallowing pills per NSG report.  Pt initially stopped talking during time of admission, per report. Currently, pt is verbally responding to NSG re: ordering a breakfast tray; speech is appropriate to indicate wants/needs adequately, per NSG. Will await Neurology consult and MRA/MRI results to rule in/out a CVA before initiating a Cognitive-Linguistic evaluation. NSG agreed.   Orinda Kenner, MS, CCC-SLP Michael Gill 06/06/2016, 10:20 AM

## 2016-06-06 NOTE — Care Management (Signed)
Admitted to [facility] with the diagnosis of TIA under observation status. Lives with wife, Butch Penny x 10 years [phone]). Seen Dr. Rebeca Alert at [facility] 2 weeks ago. Prescriptions are filled at [facility]. Home Health per [facility] 6 years go. No skilled facility. No home oxygen. Last fall was a month ago. Wonderful appetite. Takes care of all basic activities of daily living himself, doesn't drive. Wife drives. Rolling walker in the home.  Discharge to home today per Dr. Posey Pronto. No follow-up needs identified. Shelbie Ammons RN MSN CCM Care Management [phone]

## 2016-06-06 NOTE — Discharge Summary (Signed)
[place] [facility] [place] NAME: Fitzpatrick Vivian    MR#:  [id number]  DATE OF BIRTH:  [birth year]  DATE OF ADMISSION:  06/05/2016 ADMITTING PHYSICIAN: Dustin Flock, MD  DATE OF DISCHARGE: 06/06/16  PRIMARY CARE PHYSICIAN: Letta Median, MD    ADMISSION DIAGNOSIS:  Aphasia [R47.01]  DISCHARGE DIAGNOSIS:  TIA  SECONDARY DIAGNOSIS:   Past Medical History:  Diagnosis Date  . B12 deficiency   . CAD (coronary artery disease)    s/p MI  . Colon cancer ([place])   . CVA (cerebral infarction)   . Diabetes mellitus ([place])   . DVT (deep venous thrombosis) ([place])   . Hyperlipidemia   . Neuropathic pain, leg   . Pseudoseizure   . Sleep apnea     HOSPITAL COURSE:   55 year old white male presenting with right sided weakness  1. TIA -came in with right sided pain,weakness and mild slurred speech Continue aspirin -MRI brain neg for CVA -PT to see pt  2. Diabetes type 2 - on sliding scale insulin and continue 7030 insulin  3. Hyperlipidemia continue Lipitor  4. History of pseudoseizure Continue gabapentin and trazodone  5. Miscellaneous Lovenox for DVT prophylaxis  Overall feels a lot better D/c home after seen by PT D/w pt and wife CONSULTS OBTAINED:    DRUG ALLERGIES:   Allergies  Allergen Reactions  . Codeine Other (See Comments)    Reaction:  Hallucinations   . Shellfish Allergy Hives  . Sulfa Antibiotics Hives    DISCHARGE MEDICATIONS:   Current Discharge Medication List    CONTINUE these medications which have NOT CHANGED   Details  aspirin EC 81 MG tablet Take 81 mg by mouth every morning.     atorvastatin (LIPITOR) 10 MG tablet Take 10 mg by mouth every evening.     gabapentin (NEURONTIN) 300 MG capsule Take 100 mg by mouth 3 (three) times daily.     insulin aspart protamine- aspart (NOVOLOG MIX 70/30) (70-30) 100 UNIT/ML injection Inject 33 Units into the skin 2 (two) times daily.     traZODone  (DESYREL) 50 MG tablet Take 50-100 mg by mouth at bedtime as needed for sleep.        If you experience worsening of your admission symptoms, develop shortness of breath, life threatening emergency, suicidal or homicidal thoughts you must seek medical attention immediately by calling 911 or calling your MD immediately  if symptoms less severe.  You Must read complete instructions/literature along with all the possible adverse reactions/side effects for all the Medicines you take and that have been prescribed to you. Take any new Medicines after you have completely understood and accept all the possible adverse reactions/side effects.   Please note  You were cared for by a hospitalist during your hospital stay. If you have any questions about your discharge medications or the care you received while you were in the hospital after you are discharged, you can call the unit and asked to speak with the hospitalist on call if the hospitalist that took care of you is not available. Once you are discharged, your primary care physician will handle any further medical issues. Please note that NO REFILLS for any discharge medications will be authorized once you are discharged, as it is imperative that you return to your primary care physician (or establish a relationship with a primary care physician if you do not have one) for your aftercare needs so that they can reassess your  need for medications and monitor your lab values. Today   SUBJECTIVE  Doing well   VITAL SIGNS:  Blood pressure 126/82, pulse 82, temperature 98.2 F (36.8 C), temperature source Oral, resp. rate 20, height 5\' 8"  (1.727 m), weight 111.7 kg (246 lb 5 oz), SpO2 95 %.  I/O:   Intake/Output Summary (Last 24 hours) at 06/06/16 1249 Last data filed at 06/06/16 0100  Gross per 24 hour  Intake                0 ml  Output              750 ml  Net             -750 ml    PHYSICAL EXAMINATION:  GENERAL:  55 y.o.-year-old patient  lying in the bed with no acute distress. obese EYES: Pupils equal, round, reactive to light and accommodation. No scleral icterus. Extraocular muscles intact.  HEENT: Head atraumatic, normocephalic. Oropharynx and nasopharynx clear.  NECK:  Supple, no jugular venous distention. No thyroid enlargement, no tenderness.  LUNGS: Normal breath sounds bilaterally, no wheezing, rales,rhonchi or crepitation. No use of accessory muscles of respiration.  CARDIOVASCULAR: S1, S2 normal. No murmurs, rubs, or gallops.  ABDOMEN: Soft, non-tender, non-distended. Bowel sounds present. No organomegaly or mass.  EXTREMITIES: No pedal edema, cyanosis, or clubbing.  NEUROLOGIC: Cranial nerves II through XII are intact. Muscle strength 5/5 in all extremities. Sensation intact. Gait not checked.  PSYCHIATRIC: The patient is alert and oriented x 3.  SKIN: No obvious rash, lesion, or ulcer.   DATA REVIEW:   CBC   Recent Labs Lab 06/05/16 1706  WBC 8.5  HGB 16.1  HCT 46.7  PLT 173    Chemistries   Recent Labs Lab 06/05/16 1706  NA 138  K 4.3  CL 107  CO2 21*  GLUCOSE 170*  BUN 12  CREATININE 1.07  CALCIUM 9.1  AST 26  ALT 21  ALKPHOS 57  BILITOT 0.5    Microbiology Results   No results found for this or any previous visit (from the past 240 hour(s)).  RADIOLOGY:  Ct Head Wo Contrast  Result Date: 06/05/2016 CLINICAL DATA:  Right arm weakness.  Code stroke. EXAM: CT HEAD WITHOUT CONTRAST TECHNIQUE: Contiguous axial images were obtained from the base of the skull through the vertex without intravenous contrast. COMPARISON:  06/09/2015 FINDINGS: Brain: No evidence of acute infarction, hemorrhage, hydrocephalus, extra-axial collection or mass lesion/mass effect. Vascular: No hyperdense vessel or unexpected calcification. Skull: Normal. Negative for fracture or focal lesion. Sinuses/Orbits: No acute finding. Other: None. IMPRESSION: 1. Normal unenhanced CT scan of the brain. These results were  called by telephone at the time of interpretation on 06/05/2016 at 4:59 pm to Nurse Jeannett Senior, who verbally acknowledged these results. Electronically Signed   By: Lajean Manes M.D.   On: 06/05/2016 17:01   Mr Brain Wo Contrast  Result Date: 06/06/2016 CLINICAL DATA:  55 year old diabetic male with hyperlipidemia and pseudo seizures presenting with episode of patient stopping speaking. Stopped moving right-side of body. Would not verbalized at this point. More right-sided pain as opposed to weakness. Subsequent encounter. EXAM: MRI HEAD WITHOUT CONTRAST MRA HEAD WITHOUT CONTRAST TECHNIQUE: Multiplanar, multiecho pulse sequences of the brain and surrounding structures were obtained without intravenous contrast. Angiographic images of the head were obtained using MRA technique without contrast. COMPARISON:  06/05/2016 CT. 05/12/2014 MR. 01/20/2014 MR and MR angiogram. 02/20/2013 neck CT. 04/08/2010 PET-CT. FINDINGS: MRI HEAD FINDINGS Brain:  No acute infarct or intracranial hemorrhage. Minimal punctate nonspecific white matter changes are stable and may reflect result of mild chronic microvascular disease. No intracranial mass lesion noted on this unenhanced exam. No hydrocephalus. Slightly small pituitary gland unchanged. Vascular: As below. Skull and upper cervical spine: Negative. Sinuses/Orbits: No acute orbital abnormality. Minimal mucosal thickening ethmoid sinus air cells and right sphenoid sinus. Minimal polypoid opacification maxillary sinuses. Other: Left parotid nonspecific lesions measuring up to 2.3 cm. Findings have been noted previously and changed slightly since 2011 when largest lesion measured up to 2 cm. MRA HEAD FINDINGS Exam is motion degraded. Anterior circulation without medium or large size vessel significant stenosis or occlusion. Branch vessel narrowing and irregularity may be related to motion artifact versus intracranial atherosclerotic changes. Fetal contribution to the posterior cerebral  arteries. Left vertebral artery is dominant. Right vertebral artery markedly attenuated in caliber after takeoff of the right posterior inferior cerebellar artery. Narrowing proximal basilar artery versus result of artifact. Moderate narrowing portions of the posterior inferior cerebellar artery greater on the right. Nonvisualized anterior inferior cerebellar artery bilaterally. Narrowing distal posterior cerebral artery branches greater on the right. No aneurysm noted. IMPRESSION: MRI HEAD No acute infarct or intracranial hemorrhage. Minimal punctate nonspecific white matter changes are stable and may reflect result of mild chronic microvascular disease. Minimal mucosal thickening ethmoid sinus air cells and right sphenoid sinus. Minimal polypoid opacification maxillary sinuses. Left parotid nonspecific lesions measuring up to 2.3 cm. Findings have been noted previously and changed slightly since 2011 when largest lesion measured up to 2 cm. MRA HEAD Exam is motion degraded. Question branch vessel atherosclerotic changes versus motion artifact. Electronically Signed   By: Genia Del M.D.   On: 06/06/2016 10:29   US Carotid Bilateral (at [facility] And [facility] Only)  Result Date: 06/06/2016 CLINICAL DATA:  TIA. EXAM: BILATERAL CAROTID DUPLEX ULTRASOUND TECHNIQUE: Pearline Cables scale imaging, color Doppler and duplex ultrasound were performed of bilateral carotid and vertebral arteries in the neck. COMPARISON:  CT 06/05/2016. FINDINGS: Criteria: Quantification of carotid stenosis is based on velocity parameters that correlate the residual internal carotid diameter with NASCET-based stenosis levels, using the diameter of the distal internal carotid lumen as the denominator for stenosis measurement. The following velocity measurements were obtained: RIGHT ICA:  69/19 cm/sec CCA:  [id number] cm/sec SYSTOLIC ICA/CCA RATIO:  0.9 DIASTOLIC ICA/CCA RATIO:  0.8 ECA:  68 cm/sec LEFT ICA:  57/24 cm/sec CCA:  [id number] cm/sec SYSTOLIC ICA/CCA RATIO:   0.7 DIASTOLIC ICA/CCA RATIO:  0.7 ECA:  65 cm/sec RIGHT CAROTID ARTERY: No significant carotid atherosclerotic vascular disease. RIGHT VERTEBRAL ARTERY:  Patent with antegrade flow. LEFT CAROTID ARTERY: No significant carotid atherosclerotic vascular disease . LEFT VERTEBRAL ARTERY:  Patent antegrade flow . IMPRESSION: 1. No significant carotid atherosclerotic vascular disease. 2. Vertebral arteries are patent with antegrade flow. Electronically Signed   By: Marcello Moores  Register   On: 06/06/2016 08:57   Mr Jodene Nam Head/brain Wo Cm  Result Date: 06/06/2016 CLINICAL DATA:  55 year old diabetic male with hyperlipidemia and pseudo seizures presenting with episode of patient stopping speaking. Stopped moving right-side of body. Would not verbalized at this point. More right-sided pain as opposed to weakness. Subsequent encounter. EXAM: MRI HEAD WITHOUT CONTRAST MRA HEAD WITHOUT CONTRAST TECHNIQUE: Multiplanar, multiecho pulse sequences of the brain and surrounding structures were obtained without intravenous contrast. Angiographic images of the head were obtained using MRA technique without contrast. COMPARISON:  06/05/2016 CT. 05/12/2014 MR. 01/20/2014 MR and MR angiogram. 02/20/2013 neck CT. 04/08/2010 PET-CT.  FINDINGS: MRI HEAD FINDINGS Brain: No acute infarct or intracranial hemorrhage. Minimal punctate nonspecific white matter changes are stable and may reflect result of mild chronic microvascular disease. No intracranial mass lesion noted on this unenhanced exam. No hydrocephalus. Slightly small pituitary gland unchanged. Vascular: As below. Skull and upper cervical spine: Negative. Sinuses/Orbits: No acute orbital abnormality. Minimal mucosal thickening ethmoid sinus air cells and right sphenoid sinus. Minimal polypoid opacification maxillary sinuses. Other: Left parotid nonspecific lesions measuring up to 2.3 cm. Findings have been noted previously and changed slightly since 2011 when largest lesion measured up to 2  cm. MRA HEAD FINDINGS Exam is motion degraded. Anterior circulation without medium or large size vessel significant stenosis or occlusion. Branch vessel narrowing and irregularity may be related to motion artifact versus intracranial atherosclerotic changes. Fetal contribution to the posterior cerebral arteries. Left vertebral artery is dominant. Right vertebral artery markedly attenuated in caliber after takeoff of the right posterior inferior cerebellar artery. Narrowing proximal basilar artery versus result of artifact. Moderate narrowing portions of the posterior inferior cerebellar artery greater on the right. Nonvisualized anterior inferior cerebellar artery bilaterally. Narrowing distal posterior cerebral artery branches greater on the right. No aneurysm noted. IMPRESSION: MRI HEAD No acute infarct or intracranial hemorrhage. Minimal punctate nonspecific white matter changes are stable and may reflect result of mild chronic microvascular disease. Minimal mucosal thickening ethmoid sinus air cells and right sphenoid sinus. Minimal polypoid opacification maxillary sinuses. Left parotid nonspecific lesions measuring up to 2.3 cm. Findings have been noted previously and changed slightly since 2011 when largest lesion measured up to 2 cm. MRA HEAD Exam is motion degraded. Question branch vessel atherosclerotic changes versus motion artifact. Electronically Signed   By: Genia Del M.D.   On: 06/06/2016 10:29     Management plans discussed with the patient, family and they are in agreement.  CODE STATUS:     Code Status Orders        Start     Ordered   06/05/16 2027  Full code  Continuous     06/05/16 2026    Code Status History    Date Active Date Inactive Code Status Order ID Comments User Context   This patient has a current code status but no historical code status.      TOTAL TIME TAKING CARE OF THIS PATIENT: 40 minutes.    Lasean Rahming M.D on 06/06/2016 at 12:49 PM  Between 7am to  6pm - Pager - [phone] After 6pm go to [email or web address] - password [email or web address] [facility]  Office  [phone]  CC: Primary care physician; Letta Median, MD

## 2016-06-07 LAB — HEMOGLOBIN A1C
HEMOGLOBIN A1C: 7.4 % — AB (ref 4.8–5.6)
MEAN PLASMA GLUCOSE: 166 mg/dL

## 2016-06-07 NOTE — Progress Notes (Signed)
   06/06/16 1411  OT G-codes **NOT FOR INPATIENT CLASS**  Functional Assessment Tool Used Clinical judgement based on pt. current functional status  Self Care Current Status [id number]) CJ  Self Care Goal Status [id number]) CJ  06/07/2016: Late entry G-code assessed and entered by Harrel Carina, MS, OTR/L. Initial patient evaluation was performed by Harrel Carina, MS, OTR/L

## 2016-06-27 ENCOUNTER — Other Ambulatory Visit: Payer: Self-pay | Admitting: Family Medicine

## 2016-06-27 DIAGNOSIS — N63 Unspecified lump in unspecified breast: Secondary | ICD-10-CM

## 2016-07-11 ENCOUNTER — Ambulatory Visit
Admission: RE | Admit: 2016-07-11 | Discharge: 2016-07-11 | Disposition: A | Discharge status: Home / Self Care | Payer: Medicare HMO | Source: Ambulatory Visit | Attending: Family Medicine | Admitting: Family Medicine

## 2016-07-11 DIAGNOSIS — N63 Unspecified lump in unspecified breast: Secondary | ICD-10-CM

## 2016-07-11 DIAGNOSIS — N644 Mastodynia: Secondary | ICD-10-CM | POA: Insufficient documentation

## 2016-07-11 DIAGNOSIS — N62 Hypertrophy of breast: Secondary | ICD-10-CM | POA: Insufficient documentation

## 2016-07-27 ENCOUNTER — Inpatient Hospital Stay: Payer: Medicare HMO

## 2016-07-27 ENCOUNTER — Inpatient Hospital Stay: Payer: Medicare HMO | Admitting: Hematology and Oncology

## 2016-08-08 ENCOUNTER — Encounter: Payer: Self-pay | Admitting: *Deleted

## 2016-08-08 ENCOUNTER — Emergency Department
Admission: EM | Admit: 2016-08-08 | Discharge: 2016-08-08 | Disposition: A | Discharge status: Home / Self Care | Payer: Medicare HMO | Attending: Emergency Medicine | Admitting: Emergency Medicine

## 2016-08-08 DIAGNOSIS — Z85038 Personal history of other malignant neoplasm of large intestine: Secondary | ICD-10-CM | POA: Diagnosis not present

## 2016-08-08 DIAGNOSIS — Z7982 Long term (current) use of aspirin: Secondary | ICD-10-CM | POA: Diagnosis not present

## 2016-08-08 DIAGNOSIS — I2510 Atherosclerotic heart disease of native coronary artery without angina pectoris: Secondary | ICD-10-CM | POA: Diagnosis not present

## 2016-08-08 DIAGNOSIS — E119 Type 2 diabetes mellitus without complications: Secondary | ICD-10-CM | POA: Insufficient documentation

## 2016-08-08 DIAGNOSIS — Z794 Long term (current) use of insulin: Secondary | ICD-10-CM | POA: Diagnosis not present

## 2016-08-08 DIAGNOSIS — H9202 Otalgia, left ear: Secondary | ICD-10-CM | POA: Diagnosis not present

## 2016-08-08 LAB — CBC WITH DIFFERENTIAL/PLATELET
Basophils Absolute: 0 10*3/uL (ref 0–0.1)
Basophils Relative: 1 %
EOS ABS: 0.1 10*3/uL (ref 0–0.7)
Eosinophils Relative: 2 %
HCT: 45.9 % (ref 40.0–52.0)
HEMOGLOBIN: 15.9 g/dL (ref 13.0–18.0)
LYMPHS ABS: 1.6 10*3/uL (ref 1.0–3.6)
LYMPHS PCT: 27 %
MCH: 31.5 pg (ref 26.0–34.0)
MCHC: 34.6 g/dL (ref 32.0–36.0)
MCV: 91.3 fL (ref 80.0–100.0)
MONOS PCT: 10 %
Monocytes Absolute: 0.6 10*3/uL (ref 0.2–1.0)
NEUTROS PCT: 60 %
Neutro Abs: 3.6 10*3/uL (ref 1.4–6.5)
Platelets: 177 10*3/uL (ref 150–440)
RBC: 5.03 MIL/uL (ref 4.40–5.90)
RDW: 12.8 % (ref 11.5–14.5)
WBC: 5.9 10*3/uL (ref 3.8–10.6)

## 2016-08-08 LAB — SEDIMENTATION RATE: SED RATE: 2 mm/h (ref 0–20)

## 2016-08-08 MED ORDER — OXYCODONE-ACETAMINOPHEN 5-325 MG PO TABS: 1.0000 | ORAL_TABLET | ORAL | 0 refills | Status: DC | PRN

## 2016-08-08 MED ORDER — NEOMYCIN-POLYMYXIN-HC 3.5-10000-1 OT SOLN: 3.0000 [drp] | Freq: Four times a day (QID) | OTIC | 0 refills | Status: AC

## 2016-08-08 MED ORDER — OXYCODONE-ACETAMINOPHEN 5-325 MG PO TABS
2.0000 | ORAL_TABLET | Freq: Once | ORAL | Status: AC
  Administered 2016-08-08: 2 via ORAL
  Filled 2016-08-08: qty 2

## 2016-08-08 NOTE — ED Notes (Signed)
Pt in via triage with complaints left ear pain radiating down into neck and up into left eye and temple region since Saturday.  Swelling noted to left neck and face.  Pt ambulatory to room, no immediate distress at this time.

## 2016-08-08 NOTE — ED Provider Notes (Signed)
[facility] Emergency Department Provider Note   ____________________________________________   None    (approximate)  I have reviewed the triage vital signs and the nursing notes.   HISTORY  Chief Complaint Otalgia and Headache   HPI Michael Gill is a 56 y.o. male is here with complaint of left ear pain 3 days. Patient states that he is taken Advil last evening without any relief. Patient states that her pain radiates up to his head and down his neck. He denies any fever or chills. He is unaware of any upper respiratory symptoms. Patient states he has had similar symptoms in the past and was seen by ENT but he does not recall where he saw his doctor. Patient has a history of TIAs. Wife is present and states that she does not recall the name of his ENT as well or at which hospital they were out. Currently patient denies any dizziness, nausea or vomiting. Patient states that the left side of his face is extremely tender near his ear. There is been no drainage. He rates his pain as 10 over 10.   Past Medical History:  Diagnosis Date  . B12 deficiency   . CAD (coronary artery disease)    s/p MI  . Colon cancer ([place])   . CVA (cerebral infarction)   . Diabetes mellitus ([place])   . DVT (deep venous thrombosis) ([place])   . Hyperlipidemia   . Neuropathic pain, leg   . Pseudoseizure   . Sleep apnea     Patient Active Problem List   Diagnosis Date Noted  . Deep vein thrombosis (DVT) of popliteal vein of right lower extremity ([place]) 10/30/2014  . TIA (transient ischemic attack) 01/19/2014    Past Surgical History:  Procedure Laterality Date  . APPENDECTOMY    . CARDIAC CATHETERIZATION Left 10/12/2015   Procedure: Left Heart Cath and Coronary Angiography;  Surgeon: Yolonda Kida, MD;  Location: [place] [facility];  Service: Cardiovascular;  Laterality: Left;  . TONSILLECTOMY      Prior to Admission medications   Medication Sig Start Date End Date  Taking? Authorizing Provider  aspirin EC 81 MG tablet Take 81 mg by mouth every morning.     Historical Provider, MD  atorvastatin (LIPITOR) 10 MG tablet Take 10 mg by mouth every evening.     Historical Provider, MD  gabapentin (NEURONTIN) 300 MG capsule Take 100 mg by mouth 3 (three) times daily.     Historical Provider, MD  insulin aspart protamine- aspart (NOVOLOG MIX 70/30) (70-30) 100 UNIT/ML injection Inject 33 Units into the skin 2 (two) times daily.     Historical Provider, MD  neomycin-polymyxin-hydrocortisone (CORTISPORIN) otic solution Place 3 drops into the left ear 4 (four) times daily. 08/08/16 08/18/16  Johnn Hai, PA-C  oxyCODONE-acetaminophen (PERCOCET) 5-325 MG tablet Take 1 tablet by mouth every 4 (four) hours as needed for severe pain. 08/08/16   Johnn Hai, PA-C  traZODone (DESYREL) 50 MG tablet Take 50-100 mg by mouth at bedtime as needed for sleep.    Historical Provider, MD    Allergies Codeine; Shellfish allergy; and Sulfa antibiotics  Family History  Problem Relation Age of Onset  . Alzheimer's disease Mother   . Cancer Brother     Social History Social History  Substance Use Topics  . Smoking status: Never Smoker  . Smokeless tobacco: Never Used  . Alcohol use No    Review of Systems Constitutional: No fever/chills Eyes: No  visual changes. ENT: Positive for left ear pain. Cardiovascular: Denies chest pain. Respiratory: Denies shortness of breath. Gastrointestinal: No abdominal pain.  No nausea, no vomiting.   Musculoskeletal: Negative for back pain. Skin: Negative for rash. Neurological: Negative for headaches, focal weakness or numbness.  10-point ROS otherwise negative.  ____________________________________________   PHYSICAL EXAM:  VITAL SIGNS: ED Triage Vitals  Enc Vitals Group     BP 08/08/16 0758 (!) 145/86     Pulse Rate 08/08/16 0758 82     Resp 08/08/16 0758 18     Temp 08/08/16 0758 97.3 F (36.3 C)     Temp Source  08/08/16 0758 Oral     SpO2 08/08/16 0758 96 %     Weight 08/08/16 0759 245 lb (111.1 kg)     Height 08/08/16 0759 5\' 8"  (1.727 m)     Head Circumference --      Peak Flow --      Pain Score 08/08/16 0759 10     Pain Loc --      Pain Edu? --      Excl. in [place]? --     Constitutional: Alert and oriented. Well appearing and in no acute distress. Eyes: Conjunctivae are normal. PERRL. EOMI. Head: Atraumatic. Nose: No congestion/rhinnorhea.  Right EAC and TM are clear. Left EAC is extremely tender to visualize. There is no exudate or drainage in the canal. TM is dull but no erythema or injection is noted and no fluid level present. Patient is also tender preauricular area with out erythema or soft tissue swelling. There is no drainage from the canal. Mouth/Throat: Mucous membranes are moist.  Oropharynx non-erythematous. Neck: No stridor.   Hematological/Lymphatic/Immunilogical: No cervical lymphadenopathy. Cardiovascular: Normal rate, regular rhythm. Grossly normal heart sounds.  Good peripheral circulation. Respiratory: Normal respiratory effort.  No retractions. Lungs CTAB. Musculoskeletal: Moves upper and lower extremities without any difficulty.  Neurologic:  Normal speech and language. No gross focal neurologic deficits are appreciated. No gait instability. Skin:  Skin is warm, dry and intact. No rash noted. Psychiatric: Mood and affect are normal. Speech and behavior are normal.  ____________________________________________   LABS (all labs ordered are listed, but only abnormal results are displayed)  Labs Reviewed  CBC WITH DIFFERENTIAL/PLATELET  SEDIMENTATION RATE     PROCEDURES  Procedure(s) performed: None  Procedures  Critical Care performed: No  ____________________________________________   INITIAL IMPRESSION / ASSESSMENT AND PLAN / ED COURSE  Pertinent labs & imaging results that were available during my care of the patient were reviewed by me and considered  in my medical decision making (see chart for details).    Clinical Course    Patient is follow-up with [facility] and information for Dr. Tami Ribas was given since he is the specialist on call today. Patient and spouse was made aware that her white count and sedimentation rate were within normal limits. While in the emergency room patient was given Percocet which eased his pain and he was able to sleep while waiting for test results. Patient was given a prescription for Percocet along with Cortisporin otic suspension. He is to follow-up with ENT for further evaluation of his ear pain.  ____________________________________________   FINAL CLINICAL IMPRESSION(S) / ED DIAGNOSES  Final diagnoses:  Left ear pain      NEW MEDICATIONS STARTED DURING THIS VISIT:  Discharge Medication List as of 08/08/2016 10:08 AM    START taking these medications   Details  neomycin-polymyxin-hydrocortisone (CORTISPORIN) otic solution Place 3 drops  into the left ear 4 (four) times daily., Starting Tue 08/08/2016, Until Fri 08/18/2016, Print    oxyCODONE-acetaminophen (PERCOCET) 5-325 MG tablet Take 1 tablet by mouth every 4 (four) hours as needed for severe pain., Starting Tue 08/08/2016, Print         Note:  This document was prepared using Dragon voice recognition software and may include unintentional dictation errors.    Johnn Hai, PA-C 08/08/16 [place], MD 08/12/16 1504

## 2016-08-08 NOTE — ED Triage Notes (Signed)
Pt states left ear pain radiating up to his head and down his neck since Saturday, states no relief with advil, states he has had this problem on and off for several years, states he has seen an ENT

## 2016-08-08 NOTE — Discharge Instructions (Signed)
Follow-up with Dr. Tami Ribas who is on call for [facility].  Further testing needs to be done to find the cause of your ear pain. Today's lab tests did not show any signs of infection or elevated sedimentation rate. Percocet as needed for pain. Cortisporin otic 4 times a day to help with ear pain.

## 2016-08-24 ENCOUNTER — Emergency Department
Admission: EM | Admit: 2016-08-24 | Discharge: 2016-08-24 | Disposition: A | Discharge status: Home / Self Care | Payer: Medicare HMO | Attending: Emergency Medicine | Admitting: Emergency Medicine

## 2016-08-24 ENCOUNTER — Encounter: Payer: Self-pay | Admitting: Emergency Medicine

## 2016-08-24 ENCOUNTER — Emergency Department: Payer: Medicare HMO

## 2016-08-24 DIAGNOSIS — Z7982 Long term (current) use of aspirin: Secondary | ICD-10-CM | POA: Diagnosis not present

## 2016-08-24 DIAGNOSIS — Z794 Long term (current) use of insulin: Secondary | ICD-10-CM | POA: Insufficient documentation

## 2016-08-24 DIAGNOSIS — R0602 Shortness of breath: Secondary | ICD-10-CM

## 2016-08-24 DIAGNOSIS — I2510 Atherosclerotic heart disease of native coronary artery without angina pectoris: Secondary | ICD-10-CM | POA: Diagnosis not present

## 2016-08-24 DIAGNOSIS — E119 Type 2 diabetes mellitus without complications: Secondary | ICD-10-CM | POA: Insufficient documentation

## 2016-08-24 DIAGNOSIS — J189 Pneumonia, unspecified organism: Secondary | ICD-10-CM

## 2016-08-24 DIAGNOSIS — Z85038 Personal history of other malignant neoplasm of large intestine: Secondary | ICD-10-CM | POA: Diagnosis not present

## 2016-08-24 LAB — CBC
HCT: 42.9 % (ref 40.0–52.0)
HEMOGLOBIN: 15 g/dL (ref 13.0–18.0)
MCH: 31.5 pg (ref 26.0–34.0)
MCHC: 35 g/dL (ref 32.0–36.0)
MCV: 90 fL (ref 80.0–100.0)
PLATELETS: 191 10*3/uL (ref 150–440)
RBC: 4.77 MIL/uL (ref 4.40–5.90)
RDW: 12.9 % (ref 11.5–14.5)
WBC: 6.4 10*3/uL (ref 3.8–10.6)

## 2016-08-24 LAB — TROPONIN I

## 2016-08-24 LAB — BASIC METABOLIC PANEL
ANION GAP: 5 (ref 5–15)
BUN: 16 mg/dL (ref 6–20)
CALCIUM: 9 mg/dL (ref 8.9–10.3)
CO2: 25 mmol/L (ref 22–32)
CREATININE: 1 mg/dL (ref 0.61–1.24)
Chloride: 108 mmol/L (ref 101–111)
GLUCOSE: 119 mg/dL — AB (ref 65–99)
Potassium: 4.1 mmol/L (ref 3.5–5.1)
Sodium: 138 mmol/L (ref 135–145)

## 2016-08-24 LAB — BRAIN NATRIURETIC PEPTIDE: B NATRIURETIC PEPTIDE 5: 8 pg/mL (ref 0.0–100.0)

## 2016-08-24 MED ORDER — ONDANSETRON 4 MG PO TBDP
4.0000 mg | ORAL_TABLET | Freq: Once | ORAL | Status: AC
  Administered 2016-08-24: 4 mg via ORAL

## 2016-08-24 MED ORDER — IPRATROPIUM-ALBUTEROL 0.5-2.5 (3) MG/3ML IN SOLN
3.0000 mL | Freq: Once | RESPIRATORY_TRACT | Status: AC
  Administered 2016-08-24: 3 mL via RESPIRATORY_TRACT
  Filled 2016-08-24: qty 3

## 2016-08-24 MED ORDER — AZITHROMYCIN 250 MG PO TABS: ORAL_TABLET | ORAL | 0 refills | Status: AC

## 2016-08-24 MED ORDER — ONDANSETRON 4 MG PO TBDP
ORAL_TABLET | ORAL | Status: AC
  Filled 2016-08-24: qty 1

## 2016-08-24 MED ORDER — ALBUTEROL SULFATE HFA 108 (90 BASE) MCG/ACT IN AERS: 2.0000 | INHALATION_SPRAY | Freq: Four times a day (QID) | RESPIRATORY_TRACT | 0 refills | Status: DC | PRN

## 2016-08-24 NOTE — Discharge Instructions (Signed)
Please seek medical attention for any high fevers, chest pain, shortness of breath, change in behavior, persistent vomiting, bloody stool or any other new or concerning symptoms.  

## 2016-08-24 NOTE — ED Provider Notes (Signed)
[facility] Emergency Department Provider Note   ____________________________________________   I have reviewed the triage vital signs and the nursing notes.   HISTORY  Chief Complaint Shortness of Breath   History limited by: Not Limited   HPI Michael Gill is a 56 y.o. male who presents to the emergency department today because of concerns for shortness of breath and leg swelling. Patient states the shortness breath is been present past 3 days. He is having a hard time lying flat. There is associated cough productive of clear phlegm. In addition there is swelling in his legs. There is some discomfort with the swelling. Patient denies similar symptoms in the past. He denies any fevers.    Past Medical History:  Diagnosis Date  . B12 deficiency   . CAD (coronary artery disease)    s/p MI  . Colon cancer ([place])   . CVA (cerebral infarction)   . Diabetes mellitus ([place])   . DVT (deep venous thrombosis) ([place])   . Hyperlipidemia   . Neuropathic pain, leg   . Pseudoseizure   . Sleep apnea     Patient Active Problem List   Diagnosis Date Noted  . Deep vein thrombosis (DVT) of popliteal vein of right lower extremity ([place]) 10/30/2014  . TIA (transient ischemic attack) 01/19/2014    Past Surgical History:  Procedure Laterality Date  . APPENDECTOMY    . CARDIAC CATHETERIZATION Left 10/12/2015   Procedure: Left Heart Cath and Coronary Angiography;  Surgeon: Yolonda Kida, MD;  Location: [place] [facility];  Service: Cardiovascular;  Laterality: Left;  . TONSILLECTOMY      Prior to Admission medications   Medication Sig Start Date End Date Taking? Authorizing Provider  aspirin EC 81 MG tablet Take 81 mg by mouth every morning.     Historical Provider, MD  atorvastatin (LIPITOR) 10 MG tablet Take 10 mg by mouth every evening.     Historical Provider, MD  gabapentin (NEURONTIN) 300 MG capsule Take 100 mg by mouth 3 (three) times daily.     Historical  Provider, MD  insulin aspart protamine- aspart (NOVOLOG MIX 70/30) (70-30) 100 UNIT/ML injection Inject 33 Units into the skin 2 (two) times daily.     Historical Provider, MD  oxyCODONE-acetaminophen (PERCOCET) 5-325 MG tablet Take 1 tablet by mouth every 4 (four) hours as needed for severe pain. 08/08/16   Johnn Hai, PA-C  traZODone (DESYREL) 50 MG tablet Take 50-100 mg by mouth at bedtime as needed for sleep.    Historical Provider, MD    Allergies Codeine; Shellfish allergy; and Sulfa antibiotics  Family History  Problem Relation Age of Onset  . Alzheimer's disease Mother   . Cancer Brother     Social History Social History  Substance Use Topics  . Smoking status: Never Smoker  . Smokeless tobacco: Never Used  . Alcohol use No    Review of Systems  Constitutional: Negative for fever. Cardiovascular: Positive for chest pain. Respiratory: Positive for shortness of breath. Gastrointestinal: Negative for abdominal pain, vomiting and diarrhea. Genitourinary: Negative for dysuria. Musculoskeletal: Negative for back pain. Positive for leg swelling and pain. Skin: Negative for rash. Neurological: Negative for headaches, focal weakness or numbness.  10-point ROS otherwise negative.  ____________________________________________   PHYSICAL EXAM:  VITAL SIGNS: ED Triage Vitals  Enc Vitals Group     BP 08/24/16 1540 130/90     Pulse Rate 08/24/16 1540 74     Resp 08/24/16 1540 17  Temp 08/24/16 1540 97.5 F (36.4 C)     Temp src --      SpO2 08/24/16 1540 96 %     Weight 08/24/16 1541 245 lb (111.1 kg)     Height 08/24/16 1541 5\' 8"  (1.727 m)     Head Circumference --      Peak Flow --      Pain Score 08/24/16 1541 10    Constitutional: Alert and oriented. Well appearing and in no distress. Eyes: Conjunctivae are normal. Normal extraocular movements. ENT   Head: Normocephalic and atraumatic.   Nose: No congestion/rhinnorhea.   Mouth/Throat:  Mucous membranes are moist.   Neck: No stridor. Hematological/Lymphatic/Immunilogical: No cervical lymphadenopathy. Cardiovascular: Normal rate, regular rhythm.  No murmurs, rubs, or gallops.  Respiratory: Normal respiratory effort without tachypnea nor retractions. Breath sounds are clear and equal bilaterally. No wheezes/rales/rhonchi. Gastrointestinal: Soft and non tender. No rebound. No guarding.  Genitourinary: Deferred Musculoskeletal: Normal range of motion in all extremities. 1+ pitting bilateral lower extremity edema.  Neurologic:  Normal speech and language. No gross focal neurologic deficits are appreciated.  Skin:  Skin is warm, dry and intact. No rash noted. Psychiatric: Mood and affect are normal. Speech and behavior are normal. Patient exhibits appropriate insight and judgment.  ____________________________________________    LABS (pertinent positives/negatives)  Labs Reviewed  BASIC METABOLIC PANEL - Abnormal; Notable for the following:       Result Value   Glucose, Bld 119 (*)    All other components within normal limits  CBC  TROPONIN I  BRAIN NATRIURETIC PEPTIDE     ____________________________________________   EKG  I, Nance Pear, attending physician, personally viewed and interpreted this EKG  EKG Time: 1543 Rate: 80 Rhythm: normal sinus rhythm Axis: normal Intervals: qtc 429 QRS: narrow ST changes: no st elevation Impression: normal ekg   ____________________________________________    RADIOLOGY  CXR IMPRESSION:  Subtle increased density in the left lower lung possibly in the  lingula which may reflect atelectasis or early pneumonia. Followup  PA and lateral chest X-ray is recommended in 3-4 weeks following  trial of antibiotic therapy to ensure resolution.     ___________________________________________   PROCEDURES  Procedures  ____________________________________________   INITIAL IMPRESSION / ASSESSMENT AND PLAN /  ED COURSE  Pertinent labs & imaging results that were available during my care of the patient were reviewed by me and considered in my medical decision making (see chart for details).  Patient presented to the emergency department today because of concerns for some shortness of breath. Patient also had some concern for bilateral leg swelling. BNP was not concerning. Trop negative. No edema on CXR. Possible pneumonia. Will give patient z-pak. Doubt PE given bilateral nature of swelling, no pleuritis. No bloody cough.  ____________________________________________   FINAL CLINICAL IMPRESSION(S) / ED DIAGNOSES  Final diagnoses:  SOB (shortness of breath)  Community acquired pneumonia, unspecified laterality     Note: This dictation was prepared with Sales executive. Any transcriptional errors that result from this process are unintentional     Nance Pear, MD 08/24/16 2125

## 2016-08-24 NOTE — ED Triage Notes (Signed)
Patient presents to ED via POV from home with c/o SOB x3 days. Patient also c/o bilateral leg edema. Denies hx of CHF.

## 2016-08-24 NOTE — ED Notes (Signed)
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 Pt reports hx of CVA with left sided deficits, DVT to right popliteal. Pt reports hx of viral meningitis in 2016.  Pt c/o SOB, productive cough, bilateral lower leg edema/pain.

## 2016-10-15 ENCOUNTER — Emergency Department: Payer: No Typology Code available for payment source

## 2016-10-15 ENCOUNTER — Emergency Department
Admission: EM | Admit: 2016-10-15 | Discharge: 2016-10-15 | Disposition: A | Discharge status: Home / Self Care | Payer: No Typology Code available for payment source | Attending: Emergency Medicine | Admitting: Emergency Medicine

## 2016-10-15 ENCOUNTER — Encounter: Payer: Self-pay | Admitting: Emergency Medicine

## 2016-10-15 DIAGNOSIS — Z794 Long term (current) use of insulin: Secondary | ICD-10-CM | POA: Diagnosis not present

## 2016-10-15 DIAGNOSIS — Z7982 Long term (current) use of aspirin: Secondary | ICD-10-CM | POA: Insufficient documentation

## 2016-10-15 DIAGNOSIS — E119 Type 2 diabetes mellitus without complications: Secondary | ICD-10-CM | POA: Insufficient documentation

## 2016-10-15 DIAGNOSIS — Y939 Activity, unspecified: Secondary | ICD-10-CM | POA: Insufficient documentation

## 2016-10-15 DIAGNOSIS — S199XXA Unspecified injury of neck, initial encounter: Secondary | ICD-10-CM | POA: Diagnosis present

## 2016-10-15 DIAGNOSIS — S161XXA Strain of muscle, fascia and tendon at neck level, initial encounter: Secondary | ICD-10-CM | POA: Diagnosis not present

## 2016-10-15 DIAGNOSIS — R310 Gross hematuria: Secondary | ICD-10-CM | POA: Insufficient documentation

## 2016-10-15 DIAGNOSIS — Y92410 Unspecified street and highway as the place of occurrence of the external cause: Secondary | ICD-10-CM | POA: Diagnosis not present

## 2016-10-15 DIAGNOSIS — R1031 Right lower quadrant pain: Secondary | ICD-10-CM | POA: Insufficient documentation

## 2016-10-15 DIAGNOSIS — R0789 Other chest pain: Secondary | ICD-10-CM | POA: Insufficient documentation

## 2016-10-15 DIAGNOSIS — Z85038 Personal history of other malignant neoplasm of large intestine: Secondary | ICD-10-CM | POA: Diagnosis not present

## 2016-10-15 DIAGNOSIS — I2510 Atherosclerotic heart disease of native coronary artery without angina pectoris: Secondary | ICD-10-CM | POA: Diagnosis not present

## 2016-10-15 DIAGNOSIS — Y999 Unspecified external cause status: Secondary | ICD-10-CM | POA: Diagnosis not present

## 2016-10-15 DIAGNOSIS — T148XXA Other injury of unspecified body region, initial encounter: Secondary | ICD-10-CM

## 2016-10-15 LAB — BASIC METABOLIC PANEL
ANION GAP: 6 (ref 5–15)
BUN: 14 mg/dL (ref 6–20)
CHLORIDE: 106 mmol/L (ref 101–111)
CO2: 24 mmol/L (ref 22–32)
Calcium: 9.1 mg/dL (ref 8.9–10.3)
Creatinine, Ser: 1.08 mg/dL (ref 0.61–1.24)
GFR calc Af Amer: >60 mL/min (ref >60)
GFR calc non Af Amer: >60 mL/min (ref >60)
GLUCOSE: 253 mg/dL — AB (ref 65–99)
POTASSIUM: 3.8 mmol/L (ref 3.5–5.1)
Sodium: 136 mmol/L (ref 135–145)

## 2016-10-15 LAB — TROPONIN I: Troponin I: <0.03 ng/mL (ref <0.03)

## 2016-10-15 LAB — CBC
HCT: 43.8 % (ref 40.0–52.0)
HEMOGLOBIN: 15 g/dL (ref 13.0–18.0)
MCH: 31.7 pg (ref 26.0–34.0)
MCHC: 34.2 g/dL (ref 32.0–36.0)
MCV: 92.7 fL (ref 80.0–100.0)
Platelets: 199 10*3/uL (ref 150–440)
RBC: 4.72 MIL/uL (ref 4.40–5.90)
RDW: 13 % (ref 11.5–14.5)
WBC: 6.3 10*3/uL (ref 3.8–10.6)

## 2016-10-15 MED ORDER — IOPAMIDOL (ISOVUE-300) INJECTION 61%
100.0000 mL | Freq: Once | INTRAVENOUS | Status: AC | PRN
  Administered 2016-10-15: 100 mL via INTRAVENOUS

## 2016-10-15 MED ORDER — SODIUM CHLORIDE 0.9 % IV BOLUS (SEPSIS)
500.0000 mL | INTRAVENOUS | Status: AC
  Administered 2016-10-15: 500 mL via INTRAVENOUS

## 2016-10-15 MED ORDER — ONDANSETRON 4 MG PO TBDP: ORAL_TABLET | ORAL | 0 refills | Status: DC
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
## 2017-05-25 ENCOUNTER — Emergency Department: Payer: Medicare HMO

## 2017-05-25 ENCOUNTER — Emergency Department
Admission: EM | Admit: 2017-05-25 | Discharge: 2017-05-25 | Disposition: A | Discharge status: Home / Self Care | Payer: Medicare HMO | Attending: Emergency Medicine | Admitting: Emergency Medicine

## 2017-05-25 DIAGNOSIS — M79604 Pain in right leg: Secondary | ICD-10-CM

## 2017-05-25 DIAGNOSIS — R4701 Aphasia: Secondary | ICD-10-CM | POA: Diagnosis not present

## 2017-05-25 DIAGNOSIS — E119 Type 2 diabetes mellitus without complications: Secondary | ICD-10-CM | POA: Diagnosis not present

## 2017-05-25 DIAGNOSIS — Z7982 Long term (current) use of aspirin: Secondary | ICD-10-CM | POA: Insufficient documentation

## 2017-05-25 DIAGNOSIS — R4781 Slurred speech: Secondary | ICD-10-CM | POA: Diagnosis present

## 2017-05-25 DIAGNOSIS — Z794 Long term (current) use of insulin: Secondary | ICD-10-CM | POA: Insufficient documentation

## 2017-05-25 DIAGNOSIS — Z79899 Other long term (current) drug therapy: Secondary | ICD-10-CM | POA: Insufficient documentation

## 2017-05-25 DIAGNOSIS — I2510 Atherosclerotic heart disease of native coronary artery without angina pectoris: Secondary | ICD-10-CM | POA: Diagnosis not present

## 2017-05-25 DIAGNOSIS — Z8673 Personal history of transient ischemic attack (TIA), and cerebral infarction without residual deficits: Secondary | ICD-10-CM | POA: Diagnosis not present

## 2017-05-25 DIAGNOSIS — M79605 Pain in left leg: Secondary | ICD-10-CM | POA: Diagnosis not present

## 2017-05-25 LAB — DIFFERENTIAL
Basophils Absolute: 0 10*3/uL (ref 0–0.1)
Basophils Relative: 0 %
EOS PCT: 2 %
Eosinophils Absolute: 0.2 10*3/uL (ref 0–0.7)
LYMPHS ABS: 1.9 10*3/uL (ref 1.0–3.6)
LYMPHS PCT: 23 %
MONO ABS: 0.8 10*3/uL (ref 0.2–1.0)
Monocytes Relative: 10 %
Neutro Abs: 5.3 10*3/uL (ref 1.4–6.5)
Neutrophils Relative %: 65 %

## 2017-05-25 LAB — CBC
HEMATOCRIT: 44.9 % (ref 40.0–52.0)
HEMOGLOBIN: 15.3 g/dL (ref 13.0–18.0)
MCH: 31.5 pg (ref 26.0–34.0)
MCHC: 34 g/dL (ref 32.0–36.0)
MCV: 92.7 fL (ref 80.0–100.0)
Platelets: 196 10*3/uL (ref 150–440)
RBC: 4.85 MIL/uL (ref 4.40–5.90)
RDW: 13 % (ref 11.5–14.5)
WBC: 8.2 10*3/uL (ref 3.8–10.6)

## 2017-05-25 LAB — GLUCOSE, CAPILLARY: GLUCOSE-CAPILLARY: 242 mg/dL — AB (ref 65–99)

## 2017-05-25 LAB — COMPREHENSIVE METABOLIC PANEL
ALK PHOS: 60 U/L (ref 38–126)
ALT: 20 U/L (ref 17–63)
ANION GAP: 9 (ref 5–15)
AST: 18 U/L (ref 15–41)
Albumin: 3.7 g/dL (ref 3.5–5.0)
BILIRUBIN TOTAL: 0.9 mg/dL (ref 0.3–1.2)
BUN: 12 mg/dL (ref 6–20)
CALCIUM: 9.1 mg/dL (ref 8.9–10.3)
CO2: 25 mmol/L (ref 22–32)
Chloride: 102 mmol/L (ref 101–111)
Creatinine, Ser: 1.18 mg/dL (ref 0.61–1.24)
GFR calc non Af Amer: >60 mL/min (ref >60)
Glucose, Bld: 260 mg/dL — ABNORMAL HIGH (ref 65–99)
Potassium: 4 mmol/L (ref 3.5–5.1)
Sodium: 136 mmol/L (ref 135–145)
TOTAL PROTEIN: 6.8 g/dL (ref 6.5–8.1)

## 2017-05-25 LAB — PROTIME-INR
INR: 0.92
Prothrombin Time: 12.3 seconds (ref 11.4–15.2)

## 2017-05-25 LAB — TROPONIN I: Troponin I: <0.03 ng/mL (ref <0.03)

## 2017-05-25 LAB — APTT: aPTT: 27 seconds (ref 24–36)

## 2017-05-25 NOTE — ED Notes (Signed)
Reviewed discharge instructions, follow-up care with patient and spouse. Patient/spouse verbalized understanding.

## 2017-05-25 NOTE — ED Notes (Signed)
Called [facility]  1803 for code stroke

## 2017-05-25 NOTE — ED Notes (Signed)
Patient transported to Ultrasound 
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
## 2017-05-25 NOTE — ED Provider Notes (Signed)
[facility] Emergency Department Provider Note ____________________________________________   I have reviewed the triage vital signs and the triage nursing note.  HISTORY  Chief Complaint Code Stroke   Historian Level 5 Caveat History Limited by inability to communicate with words, he is able to give thumbs up and thumbs down Wife provides some history although she is a limited historian as well  HPI Michael Gill is a 56 y.o. male presents to the ED with neurologic complaints as well as pain complaints, wife states the patient was starting to have some slurred speech around 5 PM, followed by really inability to speak words.  Sound like he was also complaining of right leg pain which she does often have.  In triage he seemed to have trouble moving the right arm and right leg and was unable to speak verbally.  Wife states that he has a history of "9 TIAs in the past "and when I asked her about prior stroke or TPA, she states he did receive TPA one time.  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  Wife states that she did not notice any tremors or movement today so she did not think this was a pseudoseizure.  Chart history notes several prior episodes of similar complaints of slurred speech or aphasia and some episodes with extremity weakness where patient has had once TPA in 2015 followed by negative MRI.  Since then he has had several ED visits for similar episodes which were felt to be not related to acute stroke, and patient had negative MRIs.  Patient gives me the thumbs up that he is having pain in both legs.  He gives me a thumbs down for negative in terms of numbness.   Past Medical History:  Diagnosis Date  . B12 deficiency   . CAD (coronary artery disease)    s/p MI  . Colon cancer ([place])   . CVA (cerebral infarction)   . Diabetes mellitus ([place])   . DVT (deep venous thrombosis) ([place])   . Hyperlipidemia   . Neuropathic  pain, leg   . Pseudoseizure   . Sleep apnea     Patient Active Problem List   Diagnosis Date Noted  . Deep vein thrombosis (DVT) of popliteal vein of right lower extremity ([place]) 10/30/2014  . TIA (transient ischemic attack) 01/19/2014    Past Surgical History:  Procedure Laterality Date  . APPENDECTOMY    . CARDIAC CATHETERIZATION Left 10/12/2015   Procedure: Left Heart Cath and Coronary Angiography;  Surgeon: Yolonda Kida, MD;  Location: [facility];  Service: Cardiovascular;  Laterality: Left;  . TONSILLECTOMY      Prior to Admission medications   Medication Sig Start Date End Date Taking? Authorizing Provider  aspirin EC 81 MG tablet Take 81 mg by mouth every morning.    Yes [provider]  gabapentin (NEURONTIN) 300 MG capsule Take 100 mg by mouth 3 (three) times daily.    Yes [provider]  albuterol (PROVENTIL HFA;VENTOLIN HFA) 108 (90 Base) MCG/ACT inhaler Inhale 2 puffs into the lungs every 6 (six) hours as needed for wheezing or shortness of breath. 08/24/16   Nance Pear, MD  atorvastatin (LIPITOR) 10 MG tablet Take 10 mg by mouth every evening.     [provider]  insulin aspart protamine- aspart (NOVOLOG MIX 70/30) (70-30) 100 UNIT/ML injection Inject 33 Units into the skin 2 (two) times daily.     [provider]  ondansetron (ZOFRAN ODT) 4 MG disintegrating tablet Allow 1-2 tablets to dissolve in your mouth every 8 hours as needed for nausea/vomiting 10/15/16   Hinda Kehr, MD  oxyCODONE-acetaminophen (PERCOCET) 5-325 MG tablet Take 1 tablet by mouth every 4 (four) hours as needed for severe pain. 08/08/16   Johnn Hai, PA-C  traZODone (DESYREL) 50 MG tablet Take 50-100 mg by mouth at bedtime as needed for sleep.    [provider]    Allergies  Allergen Reactions  . Codeine Other (See Comments)    Reaction:  Hallucinations   . Shellfish Allergy Hives  . Sulfa Antibiotics Hives    Family  History  Problem Relation Age of Onset  . Alzheimer's disease Mother   . Cancer Brother     Social History Social History  Substance Use Topics  . Smoking status: Never Smoker  . Smokeless tobacco: Never Used  . Alcohol use No    Review of Systems  Level 5 Caveat History Limited by inability to speak. ____________________________________________   PHYSICAL EXAM:  VITAL SIGNS: ED Triage Vitals  Enc Vitals Group     BP 05/25/17 1832 (!) 132/100     Pulse Rate 05/25/17 1832 84     Resp 05/25/17 1832 14     Temp 05/25/17 1832 98 F (36.7 C)     Temp Source 05/25/17 1832 Axillary     SpO2 05/25/17 1832 97 %     Weight 05/25/17 1830 240 lb (108.9 kg)     Height 05/25/17 1830 5\' 10"  (1.778 m)     Head Circumference --      Peak Flow --      Pain Score 05/25/17 1829 0     Pain Loc --      Pain Edu? --      Excl. in [place]? --      Constitutional: Alert with eyes open to voice, but patient unable to speak.  He occasionally grimaces and looks like he might be in pain. HEENT   Head: Normocephalic and atraumatic.      Eyes: Conjunctivae are normal. Pupils equal and round.       Ears:         Nose: No congestion/rhinnorhea.   Mouth/Throat: Mucous membranes are moist.   Neck: No stridor. Cardiovascular/Chest: Normal rate, regular rhythm.  No murmurs, rubs, or gallops. Respiratory: Normal respiratory effort without tachypnea nor retractions. Breath sounds are clear and equal bilaterally. No wheezes/rales/rhonchi. Gastrointestinal: Soft. No distention, no guarding, no rebound. Nontender.    Genitourinary/rectal:Deferred Musculoskeletal: Patient indicates pain to bilateral lower extremities.  He does have 2+ lower extremity pitting edema bilateral lower extremities, right slightly worse than left. Neurologic: Unable to speak.  He is unable to really cooperate with facial muscle testing, unclear, no clear or obvious facial droop.  Although he appears he cannot lift both  arms when I place them in the air in front of him there is no drift and they are equal.  Patient indicates grimacing in pain when I try to lift each leg off the stretcher, and then indicates with thumbs down that he is not having weakness, but he is having pain in bilateral legs. Skin:  Skin is warm, dry and intact. No rash noted. Psychiatric: Mood and affect are normal. Speech and behavior are normal. Patient exhibits appropriate insight and judgment.   ____________________________________________  LABS (pertinent positives/negatives) I, Lisa Roca, MD the attending physician have reviewed the labs noted below.  Labs Reviewed  COMPREHENSIVE METABOLIC PANEL - Abnormal; Notable for the following:       Result Value   Glucose, Bld 260 (*)    All other components within normal limits  GLUCOSE, CAPILLARY - Abnormal; Notable for the following:    Glucose-Capillary 242 (*)    All other components within normal limits  PROTIME-INR  APTT  CBC  DIFFERENTIAL  TROPONIN I  CBG MONITORING, ED    ____________________________________________    EKG I, Lisa Roca, MD, the attending physician have personally viewed and interpreted all ECGs.  81 bpm normal sinus rhythm.  Narrow QS renal axis.  Normal ST and T wave ____________________________________________  RADIOLOGY All Xrays were viewed by me.  Imaging interpreted by Radiologist, and I, Lisa Roca, MD the attending physician have reviewed the radiologist interpretation noted below.  CT head without contrast:  IMPRESSION: 1. No acute intracranial infarct or other process identified. 2. ASPECTS is 10. Critical Value/emergent results were called by telephone at the time of interpretation on 05/25/2017 at 6:23 pm to Dr. Conni Slipper , who verbally acknowledged these results.  [place] for dvt:  IMPRESSION: No evidence of deep venous thrombosis.  MRI brain without contrast:  FINDINGS: Mildly motion degraded examination.  BRAIN:  No reduced diffusion to suggest acute ischemia. No susceptibility artifact to suggest hemorrhage. The ventricles and sulci are normal for patient's age. A few scattered nonspecific punctate supratentorial white matter FLAIR T2 hyperintensities, normal for age. No suspicious parenchymal signal, mass or mass effect. No abnormal extra-axial fluid collections.  VASCULAR: Normal major intracranial vascular flow voids present at skull base.  SKULL AND UPPER CERVICAL SPINE: No abnormal sellar expansion. No suspicious calvarial bone marrow signal. Craniocervical junction maintained.  SINUSES/ORBITS: Scattered mucosal retention cyst and mild paranasal sinus mucosal thickening without air-fluid levels. Mastoid air cells are well aerated. The included ocular globes and orbital contents are non-suspicious.  OTHER: Re- demonstration of multiple low signal masses LEFT parotid gland measuring to 2 cm.  IMPRESSION: 1. No acute intracranial process ; negative mildly motion degraded noncontrast MRI of the head for age. 2. Stable nonspecific LEFT parotid masses. Recommend ENT consultation on a nonemergent basis if not previously done. __________________________________________  PROCEDURES  Procedure(s) performed: None  Critical Care performed: CRITICAL CARE Performed by: Lisa Roca   Total critical care time: 40 minutes  Critical care time was exclusive of separately billable procedures and treating other patients.  Critical care was necessary to treat or prevent imminent or life-threatening deterioration.  Critical care was time spent personally by me on the following activities: development of treatment plan with patient and/or surrogate as well as nursing, discussions with consultants, evaluation of patient's response to treatment, examination of patient, obtaining history from patient or surrogate, ordering and performing treatments and interventions, ordering and review of  laboratory studies, ordering and review of radiographic studies, pulse oximetry and re-evaluation of patient's condition.   ____________________________________________  No current facility-administered medications on file prior to encounter.    Current Outpatient Prescriptions on File Prior to Encounter  Medication Sig Dispense Refill  . aspirin EC 81 MG tablet Take 81 mg by mouth every morning.     . gabapentin (NEURONTIN) 300 MG capsule Take 100 mg by mouth 3 (three) times daily.     Marland Kitchen albuterol (PROVENTIL HFA;VENTOLIN HFA) 108 (90 Base) MCG/ACT inhaler Inhale 2 puffs into the lungs every 6 (six) hours as needed for wheezing or shortness of breath. 1 Inhaler 0  . atorvastatin (LIPITOR) 10 MG tablet  Take 10 mg by mouth every evening.     . insulin aspart protamine- aspart (NOVOLOG MIX 70/30) (70-30) 100 UNIT/ML injection Inject 33 Units into the skin 2 (two) times daily.     . ondansetron (ZOFRAN ODT) 4 MG disintegrating tablet Allow 1-2 tablets to dissolve in your mouth every 8 hours as needed for nausea/vomiting 30 tablet 0  . oxyCODONE-acetaminophen (PERCOCET) 5-325 MG tablet Take 1 tablet by mouth every 4 (four) hours as needed for severe pain. 20 tablet 0  . traZODone (DESYREL) 50 MG tablet Take 50-100 mg by mouth at bedtime as needed for sleep.      ____________________________________________  ED COURSE / ASSESSMENT AND PLAN  Pertinent labs & imaging results that were available during my care of the patient were reviewed by me and considered in my medical decision making (see chart for details).   Unusual presentation with aphasia, without clear facial droop, or focal neurologic deficit on the extremities, he is not moving any of the 4 extremities very well, however he is able to hold his arms up against gravity equally.  He is able to move his toes.  He is complaining of pain to both legs rather than numbness.  They are somewhat swollen I am going to check an ultrasound to rule  out DVT given history of prior DVT, he is no longer on Xarelto.  Initial history was quite confusing for the patient who is unable to provide history and the wife who does not provide adequate history.  Chart review however was quite helpful in indicating prior TPA in 2015, multiple episodes of somewhat similar episodes that ultimately resolved with negative MRI, also multiple episodes of flailing or movement disorder/considered pseudoseizure.  No movements today.  Patient is able to interact with me through the thumbs up and thumbs down.  Code stroke had been initiated, head CT was negative.  I was able to speak with the specialist on-call neurologist who agreed clinical scenario seems unlikely acute stroke, recommends against TPA.  Does recommend MRI.  MRI without acute findings.  Per radiology will refer to ENT for sinus thickening.  Reevaluation of patient 10:45pm -patient awake and alert and completely resolved.  Patient is pleasant and is happy that MRI looks normal and he is ready to go home.  DIFFERENTIAL DIAGNOSIS: Including but not limited to ischemic or hemorrhagic stroke, pseudoseizure, complicated migraine, infection, elect light disturbance, seizure, etc.  CONSULTATIONS:   Specialist on-call tele-neurologist -does not note acute stroke pattern, less suspicious of acute stroke at this point, recommends against TPA.  Does recommend MRI.   Patient / Family / Caregiver informed of clinical course, medical decision-making process, and agree with plan.   I discussed return precautions, follow-up instructions, and discharge instructions with patient and/or family.  Discharge Instructions : You are evaluated for lower extremity pain, as well as speech problems, and your exam and evaluation are overall reassuring in the emergency department today.  Please follow-up with your primary care doctor.  Your MRI showed some sinus thickening and radiologist recommended possible follow-up with ENT  which I provided you with office number.  Return to the emergency department immediately for any new or worsening symptoms including headache, confusion or altered mental status, vision problems, weakness, numbness, seizure, fever, chest pain, leg swelling, or any other symptoms concerning to you.  ___________________________________________   FINAL CLINICAL IMPRESSION(S) / ED DIAGNOSES   Final diagnoses:  Aphasia  Bilateral leg pain  Note: This dictation was prepared with Dragon dictation. Any transcriptional errors that result from this process are unintentional    Lisa Roca, MD 05/25/17 2257

## 2017-05-25 NOTE — ED Notes (Addendum)
Removed 20 gauge peripheral IV from right AC. Area clean, dry and intact. Catheter intact.

## 2017-05-25 NOTE — ED Triage Notes (Signed)
Pt arrived by POV for c/o "stroke" - MD at bedside

## 2017-05-25 NOTE — Discharge Instructions (Signed)
You are evaluated for lower extremity pain, as well as speech problems, and your exam and evaluation are overall reassuring in the emergency department today.  Please follow-up with your primary care doctor.  Your MRI showed some sinus thickening and radiologist recommended possible follow-up with ENT which I provided you with office number.  Return to the emergency department immediately for any new or worsening symptoms including headache, confusion or altered mental status, vision problems, weakness, numbness, seizure, fever, chest pain, leg swelling, or any other symptoms concerning to you.

## 2017-05-25 NOTE — ED Notes (Addendum)
Pt is now verbal and oriented (speech is clear) - responds appropriately to questions and commands

## 2017-06-15 ENCOUNTER — Emergency Department
Admission: EM | Admit: 2017-06-15 | Discharge: 2017-06-16 | Disposition: A | Discharge status: Home / Self Care | Payer: Medicare HMO | Attending: Emergency Medicine | Admitting: Emergency Medicine

## 2017-06-15 DIAGNOSIS — Z7982 Long term (current) use of aspirin: Secondary | ICD-10-CM | POA: Insufficient documentation

## 2017-06-15 DIAGNOSIS — M79604 Pain in right leg: Secondary | ICD-10-CM

## 2017-06-15 DIAGNOSIS — Z8673 Personal history of transient ischemic attack (TIA), and cerebral infarction without residual deficits: Secondary | ICD-10-CM | POA: Insufficient documentation

## 2017-06-15 DIAGNOSIS — Z79899 Other long term (current) drug therapy: Secondary | ICD-10-CM | POA: Insufficient documentation

## 2017-06-15 DIAGNOSIS — E119 Type 2 diabetes mellitus without complications: Secondary | ICD-10-CM | POA: Diagnosis not present

## 2017-06-15 DIAGNOSIS — R2243 Localized swelling, mass and lump, lower limb, bilateral: Secondary | ICD-10-CM | POA: Diagnosis present

## 2017-06-15 DIAGNOSIS — R600 Localized edema: Secondary | ICD-10-CM | POA: Diagnosis not present

## 2017-06-15 DIAGNOSIS — M79605 Pain in left leg: Secondary | ICD-10-CM | POA: Diagnosis not present

## 2017-06-15 DIAGNOSIS — Z794 Long term (current) use of insulin: Secondary | ICD-10-CM | POA: Insufficient documentation

## 2017-06-15 DIAGNOSIS — I2510 Atherosclerotic heart disease of native coronary artery without angina pectoris: Secondary | ICD-10-CM | POA: Insufficient documentation

## 2017-06-15 NOTE — ED Triage Notes (Signed)
Patient c/o bilateral lower leg swelling, no hx of the same. Patient has visible redness/bruising to area. Patient started on lasix on Wednesday for this issue. Patient c/o 10 out 10 leg pain.

## 2017-06-16 ENCOUNTER — Emergency Department: Payer: Medicare HMO

## 2017-06-16 LAB — CBC WITH DIFFERENTIAL/PLATELET
Basophils Absolute: 0 10*3/uL (ref 0–0.1)
Basophils Relative: 0 %
EOS ABS: 0.2 10*3/uL (ref 0–0.7)
EOS PCT: 4 %
HCT: 42.7 % (ref 40.0–52.0)
Hemoglobin: 14.5 g/dL (ref 13.0–18.0)
LYMPHS ABS: 2 10*3/uL (ref 1.0–3.6)
Lymphocytes Relative: 36 %
MCH: 31.6 pg (ref 26.0–34.0)
MCHC: 34 g/dL (ref 32.0–36.0)
MCV: 92.9 fL (ref 80.0–100.0)
MONO ABS: 0.8 10*3/uL (ref 0.2–1.0)
Monocytes Relative: 15 %
Neutro Abs: 2.5 10*3/uL (ref 1.4–6.5)
Neutrophils Relative %: 45 %
PLATELETS: 227 10*3/uL (ref 150–440)
RBC: 4.59 MIL/uL (ref 4.40–5.90)
RDW: 13.3 % (ref 11.5–14.5)
WBC: 5.5 10*3/uL (ref 3.8–10.6)

## 2017-06-16 LAB — COMPREHENSIVE METABOLIC PANEL
ALBUMIN: 3.8 g/dL (ref 3.5–5.0)
ALT: 20 U/L (ref 17–63)
AST: 18 U/L (ref 15–41)
Alkaline Phosphatase: 67 U/L (ref 38–126)
Anion gap: 9 (ref 5–15)
BILIRUBIN TOTAL: 0.9 mg/dL (ref 0.3–1.2)
BUN: 19 mg/dL (ref 6–20)
CO2: 25 mmol/L (ref 22–32)
CREATININE: 1.3 mg/dL — AB (ref 0.61–1.24)
Calcium: 8.9 mg/dL (ref 8.9–10.3)
Chloride: 102 mmol/L (ref 101–111)
GFR calc Af Amer: >60 mL/min (ref >60)
GFR calc non Af Amer: 59 mL/min — ABNORMAL LOW (ref >60)
GLUCOSE: 219 mg/dL — AB (ref 65–99)
POTASSIUM: 3.8 mmol/L (ref 3.5–5.1)
Sodium: 136 mmol/L (ref 135–145)
Total Protein: 6.9 g/dL (ref 6.5–8.1)

## 2017-06-16 LAB — BRAIN NATRIURETIC PEPTIDE: B Natriuretic Peptide: 7 pg/mL (ref 0.0–100.0)

## 2017-06-16 MED ORDER — DIAZEPAM 2 MG PO TABS
2.0000 mg | ORAL_TABLET | Freq: Once | ORAL | Status: AC
  Administered 2017-06-16: 2 mg via ORAL
  Filled 2017-06-16: qty 1

## 2017-06-16 MED ORDER — MORPHINE SULFATE (PF) 4 MG/ML IV SOLN
4.0000 mg | Freq: Once | INTRAVENOUS | Status: AC
  Administered 2017-06-16: 4 mg via INTRAVENOUS
  Filled 2017-06-16: qty 1

## 2017-06-16 MED ORDER — FUROSEMIDE 10 MG/ML IJ SOLN
20.0000 mg | Freq: Once | INTRAMUSCULAR | Status: AC
  Administered 2017-06-16: 20 mg via INTRAVENOUS
  Filled 2017-06-16: qty 4

## 2017-06-16 MED ORDER — ONDANSETRON HCL 4 MG/2ML IJ SOLN
4.0000 mg | Freq: Once | INTRAMUSCULAR | Status: AC
  Administered 2017-06-16: 4 mg via INTRAVENOUS
  Filled 2017-06-16: qty 2

## 2017-06-16 MED ORDER — OXYCODONE-ACETAMINOPHEN 5-325 MG PO TABS: 1.0000 | ORAL_TABLET | ORAL | 0 refills | Status: DC | PRN

## 2017-06-16 NOTE — ED Notes (Signed)
Pt transported to US

## 2017-06-16 NOTE — ED Notes (Signed)
Per [place] tech, Michael Gill, pt voided x 3 total times in x-ray.

## 2017-06-16 NOTE — ED Notes (Signed)
Patient had episode of urinary incontinence in wheelchair while in waiting room. Patient given blue scrub pants and underwear to change into. Incontinence pad placed on bed.

## 2017-06-16 NOTE — Discharge Instructions (Signed)
1. You may take pain medicine as needed (Percocet #15). 2. Use your walker as needed for balance this weekend. 3. Return to the ER for worsening symptoms, persistent vomiting, difficulty breathing or other concerns.

## 2017-06-16 NOTE — ED Notes (Signed)
IV attempt x 2 by this RN, x 2 by Theodoro Kos, and x 1 [place] by Kerrie Pleasure.

## 2017-06-16 NOTE — ED Provider Notes (Signed)
[facility] Emergency Department Provider Note   ____________________________________________   First MD Initiated Contact with Patient 06/16/17 0022     (approximate)  I have reviewed the triage vital signs and the nursing notes.   HISTORY  Chief Complaint Leg Swelling    HPI Michael Gill is a 56 y.o. male who presents to the ED from home with a chief complaint of bilateral leg swelling.  Patient has a history of CAD, CVA, pseudoseizures, chronic bilateral leg pain, history of right lower extremity DVT no longer on Xarelto who presents with worsening bilateral for the past several weeks.  He was started on 20 mg daily Lasix by his PCP 2 days ago.  Complains of pain to bilateral lower legs secondary to swelling.  Denies associated chest pain or shortness of breath.  Denies fever, chills, abdominal pain, nausea, vomiting.  Denies recent travel or trauma.  Nothing makes his symptoms better or worse.   Past Medical History:  Diagnosis Date  . B12 deficiency   . CAD (coronary artery disease)    s/p MI  . Colon cancer ([place])   . CVA (cerebral infarction)   . Diabetes mellitus ([place])   . DVT (deep venous thrombosis) ([place])   . Hyperlipidemia   . Neuropathic pain, leg   . Pseudoseizure   . Sleep apnea     Patient Active Problem List   Diagnosis Date Noted  . Deep vein thrombosis (DVT) of popliteal vein of right lower extremity ([place]) 10/30/2014  . TIA (transient ischemic attack) 01/19/2014    Past Surgical History:  Procedure Laterality Date  . APPENDECTOMY    . Left Heart Cath and Coronary Angiography Left 10/12/2015   Performed by Yolonda Kida, MD at [facility]  . TONSILLECTOMY      Prior to Admission medications   Medication Sig Start Date End Date Taking? Authorizing Provider  albuterol (PROVENTIL HFA;VENTOLIN HFA) 108 (90 Base) MCG/ACT inhaler Inhale 2 puffs into the lungs every 6 (six) hours as needed for wheezing or shortness  of breath. 08/24/16   Nance Pear, MD  aspirin EC 81 MG tablet Take 81 mg by mouth every morning.     [provider]  atorvastatin (LIPITOR) 10 MG tablet Take 10 mg by mouth every evening.     [provider]  gabapentin (NEURONTIN) 300 MG capsule Take 100 mg by mouth 3 (three) times daily.     [provider]  insulin aspart protamine- aspart (NOVOLOG MIX 70/30) (70-30) 100 UNIT/ML injection Inject 33 Units into the skin 2 (two) times daily.     [provider]  ondansetron (ZOFRAN ODT) 4 MG disintegrating tablet Allow 1-2 tablets to dissolve in your mouth every 8 hours as needed for nausea/vomiting 10/15/16   Hinda Kehr, MD  oxyCODONE-acetaminophen (ROXICET) 5-325 MG tablet Take 1 tablet every 4 (four) hours as needed by mouth for severe pain. 06/16/17   Paulette Blanch, MD  traZODone (DESYREL) 50 MG tablet Take 50-100 mg by mouth at bedtime as needed for sleep.    [provider]    Allergies Codeine; Shellfish allergy; and Sulfa antibiotics  Family History  Problem Relation Age of Onset  . Alzheimer's disease Mother   . Cancer Brother     Social History Social History   Tobacco Use  . Smoking status: Never Smoker  . Smokeless tobacco: Never Used  Substance Use Topics  . Alcohol use: No  . Drug use: No  Review of Systems  Constitutional: No fever/chills. Eyes: No visual changes. ENT: No sore throat. Cardiovascular: Denies chest pain. Respiratory: Denies shortness of breath. Gastrointestinal: No abdominal pain.  No nausea, no vomiting.  No diarrhea.  No constipation. Genitourinary: Negative for dysuria. Musculoskeletal: Positive for bilateral lower extremity swelling and pain. Negative for back pain. Skin: Negative for rash. Neurological: Negative for headaches, focal weakness or numbness.   ____________________________________________   PHYSICAL EXAM:  VITAL SIGNS: ED Triage Vitals  Enc Vitals Group     BP  06/15/17 2334 135/80     Pulse Rate 06/15/17 2334 95     Resp 06/15/17 2334 18     Temp 06/15/17 2334 97.7 F (36.5 C)     Temp Source 06/15/17 2334 Oral     SpO2 06/15/17 2334 98 %     Weight 06/15/17 2334 240 lb (108.9 kg)     Height --      Head Circumference --      Peak Flow --      Pain Score 06/15/17 2333 10     Pain Loc --      Pain Edu? --      Excl. in [place]? --     Constitutional: Alert and oriented. Well appearing and in no acute distress. Eyes: Conjunctivae are normal. PERRL. EOMI. Head: Atraumatic. Nose: No congestion/rhinnorhea. Mouth/Throat: Mucous membranes are moist.  Oropharynx non-erythematous. Neck: No stridor.   Cardiovascular: Normal rate, regular rhythm. Grossly normal heart sounds.  Good peripheral circulation. Respiratory: Normal respiratory effort.  No retractions. Lungs CTAB. Gastrointestinal: Soft and nontender. No distention. No abdominal bruits. No CVA tenderness. Musculoskeletal: Bilateral lower extremity tenderness secondary to 2+ nonpitting edema.  Palpable and symmetrical distal pulses.  Brisk, less than 5-second capillary refill.  Symmetrically warm limbs without evidence for ischemia. Neurologic:  Normal speech and language. No gross focal neurologic deficits are appreciated.  Skin:  Skin is warm, dry and intact. No rash noted. Psychiatric: Mood and affect are normal. Speech and behavior are normal.  ____________________________________________   LABS (all labs ordered are listed, but only abnormal results are displayed)  Labs Reviewed  COMPREHENSIVE METABOLIC PANEL - Abnormal; Notable for the following components:      Result Value   Glucose, Bld 219 (*)    Creatinine, Ser 1.30 (*)    GFR calc non Af Amer 59 (*)    All other components within normal limits  CBC WITH DIFFERENTIAL/PLATELET  BRAIN NATRIURETIC PEPTIDE    ____________________________________________  EKG  None ____________________________________________  RADIOLOGY  Dg Chest 2 View  Result Date: 06/16/2017 CLINICAL DATA:  56 year old male with bilateral leg swelling. EXAM: CHEST  2 VIEW COMPARISON:  Chest CT dated 10/15/2016 FINDINGS: The lungs are clear. There is no pleural effusion or pneumothorax. Top-normal cardiac silhouette. No acute osseous pathology. IMPRESSION: No active cardiopulmonary disease. Electronically Signed   By: Anner Crete M.D.   On: 06/16/2017 00:31   US Venous Img Lower Bilateral  Result Date: 06/16/2017 CLINICAL DATA:  Acute onset of bilateral lower extremity swelling and pain, with erythema and bruising. EXAM: BILATERAL LOWER EXTREMITY VENOUS DOPPLER ULTRASOUND TECHNIQUE: Gray-scale sonography with graded compression, as well as color Doppler and duplex ultrasound were performed to evaluate the lower extremity deep venous systems from the level of the common femoral vein and including the common femoral, femoral, profunda femoral, popliteal and calf veins including the posterior tibial, peroneal and gastrocnemius veins when visible. The superficial great saphenous vein was also interrogated. Spectral Doppler  was utilized to evaluate flow at rest and with distal augmentation maneuvers in the common femoral, femoral and popliteal veins. COMPARISON:  Bilateral lower extremity venous Doppler ultrasound performed 05/25/2017 FINDINGS: RIGHT LOWER EXTREMITY Common Femoral Vein: No evidence of thrombus. Normal compressibility, respiratory phasicity and response to augmentation. Saphenofemoral Junction: No evidence of thrombus. Normal compressibility and flow on color Doppler imaging. Profunda Femoral Vein: No evidence of thrombus. Normal compressibility and flow on color Doppler imaging. Femoral Vein: No evidence of thrombus. Normal compressibility, respiratory phasicity and response to augmentation. Popliteal Vein: No  evidence of thrombus. Normal compressibility, respiratory phasicity and response to augmentation. Calf Veins: No evidence of thrombus. Normal compressibility and flow on color Doppler imaging. Superficial Great Saphenous Vein: No evidence of thrombus. Normal compressibility. Venous Reflux:  None. Other Findings:  None. LEFT LOWER EXTREMITY Common Femoral Vein: No evidence of thrombus. Normal compressibility, respiratory phasicity and response to augmentation. Saphenofemoral Junction: No evidence of thrombus. Normal compressibility and flow on color Doppler imaging. Profunda Femoral Vein: No evidence of thrombus. Normal compressibility and flow on color Doppler imaging. Femoral Vein: No evidence of thrombus. Normal compressibility, respiratory phasicity and response to augmentation. Popliteal Vein: No evidence of thrombus. Normal compressibility, respiratory phasicity and response to augmentation. Calf Veins: No evidence of thrombus. Normal compressibility and flow on color Doppler imaging. Superficial Great Saphenous Vein: No evidence of thrombus. Normal compressibility. Venous Reflux:  None. Other Findings:  None. IMPRESSION: No evidence of deep venous thrombosis. Electronically Signed   By: Garald Balding M.D.   On: 06/16/2017 03:06    ____________________________________________   PROCEDURES  Procedure(s) performed: None  Procedures  Critical Care performed: No  ____________________________________________   INITIAL IMPRESSION / ASSESSMENT AND PLAN / ED COURSE  As part of my medical decision making, I reviewed the following data within the [place] History obtained from family, Nursing notes reviewed and incorporated, Labs reviewed, Radiograph reviewed and Notes from prior ED visits.   56 year old male who presents with worsening bilateral lower extremity swelling, on 20 mg Lasix started 2 days ago.  No chest pain or shortness of breath to suggest PE.  History of DVT, no  longer on Xarelto.  Differential diagnosis includes but is not limited to CAD, CHF, peripheral edema, DVT.  Laboratory and chest x-ray results unremarkable except for mild renal insufficiency.  Will administer IV Lasix, analgesia and obtain Doppler ultrasounds of bilateral lower extremities to evaluate for DVT.  Clinical Course as of Jun 16 326  Sat Jun 16, 2017  [id number] Patient feeling much better after morphine.  Updated patient and spouse of ultrasound results.  Patient urinated times already.  We will continue his Lasix at the current dosage of 20 mg daily.  TED hose provided.  Will discharge home with limited prescription for analgesics.  Patient has a walker at home; encouraged him to use the walker for the next few days for balance.  Strict return precautions given.  Both verbalize understanding and agree with plan of care.  [JS]    Clinical Course User Index [JS] Paulette Blanch, MD     ____________________________________________   FINAL CLINICAL IMPRESSION(S) / ED DIAGNOSES  Final diagnoses:  Bilateral lower extremity edema  Pain in both lower extremities     ED Discharge Orders        Ordered    oxyCODONE-acetaminophen (ROXICET) 5-325 MG tablet  Every 4 hours PRN     06/16/17 0325       Note:  This  document was prepared using Systems analyst and may include unintentional dictation errors.    Paulette Blanch, MD 06/16/17 [phone]

## 2017-06-16 NOTE — ED Notes (Signed)
[place] called this RN to inform that pt was urinating at this time.

## 2017-07-02 ENCOUNTER — Ambulatory Visit
Admission: RE | Admit: 2017-07-02 | Discharge: 2017-07-02 | Disposition: A | Discharge status: Home / Self Care | Payer: Medicare HMO | Source: Ambulatory Visit | Attending: Family Medicine | Admitting: Family Medicine

## 2017-07-02 ENCOUNTER — Other Ambulatory Visit: Payer: Self-pay | Admitting: Family Medicine

## 2017-07-02 DIAGNOSIS — R0602 Shortness of breath: Secondary | ICD-10-CM

## 2017-07-02 DIAGNOSIS — I517 Cardiomegaly: Secondary | ICD-10-CM | POA: Insufficient documentation

## 2017-07-02 DIAGNOSIS — R609 Edema, unspecified: Secondary | ICD-10-CM

## 2017-11-21 ENCOUNTER — Ambulatory Visit: Payer: Medicare HMO | Attending: Neurology

## 2017-11-21 DIAGNOSIS — R0683 Snoring: Secondary | ICD-10-CM | POA: Insufficient documentation

## 2017-11-21 DIAGNOSIS — G4733 Obstructive sleep apnea (adult) (pediatric): Secondary | ICD-10-CM | POA: Diagnosis present

## 2017-11-21 DIAGNOSIS — G4761 Periodic limb movement disorder: Secondary | ICD-10-CM | POA: Diagnosis not present

## 2018-02-24 ENCOUNTER — Encounter: Payer: Self-pay | Admitting: Emergency Medicine

## 2018-02-24 ENCOUNTER — Emergency Department
Admission: EM | Admit: 2018-02-24 | Discharge: 2018-02-24 | Disposition: A | Discharge status: Home / Self Care | Payer: Medicare HMO | Attending: Emergency Medicine | Admitting: Emergency Medicine

## 2018-02-24 ENCOUNTER — Emergency Department: Payer: Medicare HMO

## 2018-02-24 DIAGNOSIS — E785 Hyperlipidemia, unspecified: Secondary | ICD-10-CM | POA: Insufficient documentation

## 2018-02-24 DIAGNOSIS — Z7982 Long term (current) use of aspirin: Secondary | ICD-10-CM | POA: Diagnosis not present

## 2018-02-24 DIAGNOSIS — Z79899 Other long term (current) drug therapy: Secondary | ICD-10-CM | POA: Diagnosis not present

## 2018-02-24 DIAGNOSIS — R200 Anesthesia of skin: Secondary | ICD-10-CM | POA: Diagnosis not present

## 2018-02-24 DIAGNOSIS — Z8673 Personal history of transient ischemic attack (TIA), and cerebral infarction without residual deficits: Secondary | ICD-10-CM | POA: Diagnosis not present

## 2018-02-24 DIAGNOSIS — Z794 Long term (current) use of insulin: Secondary | ICD-10-CM | POA: Insufficient documentation

## 2018-02-24 DIAGNOSIS — E119 Type 2 diabetes mellitus without complications: Secondary | ICD-10-CM | POA: Diagnosis not present

## 2018-02-24 DIAGNOSIS — G629 Polyneuropathy, unspecified: Secondary | ICD-10-CM | POA: Diagnosis not present

## 2018-02-24 DIAGNOSIS — Z86718 Personal history of other venous thrombosis and embolism: Secondary | ICD-10-CM | POA: Diagnosis not present

## 2018-02-24 DIAGNOSIS — I2510 Atherosclerotic heart disease of native coronary artery without angina pectoris: Secondary | ICD-10-CM | POA: Insufficient documentation

## 2018-02-24 DIAGNOSIS — G6289 Other specified polyneuropathies: Secondary | ICD-10-CM

## 2018-02-24 DIAGNOSIS — M79604 Pain in right leg: Secondary | ICD-10-CM | POA: Diagnosis present

## 2018-02-24 LAB — DIFFERENTIAL
Basophils Absolute: 0 10*3/uL (ref 0–0.1)
Basophils Relative: 1 %
EOS ABS: 0.2 10*3/uL (ref 0–0.7)
EOS PCT: 3 %
LYMPHS ABS: 2 10*3/uL (ref 1.0–3.6)
Lymphocytes Relative: 33 %
Monocytes Absolute: 0.7 10*3/uL (ref 0.2–1.0)
Monocytes Relative: 11 %
NEUTROS PCT: 52 %
Neutro Abs: 3.1 10*3/uL (ref 1.4–6.5)

## 2018-02-24 LAB — COMPREHENSIVE METABOLIC PANEL
ALT: 26 U/L (ref 0–44)
ANION GAP: 7 (ref 5–15)
AST: 21 U/L (ref 15–41)
Albumin: 3.8 g/dL (ref 3.5–5.0)
Alkaline Phosphatase: 51 U/L (ref 38–126)
BILIRUBIN TOTAL: 0.7 mg/dL (ref 0.3–1.2)
BUN: 19 mg/dL (ref 6–20)
CO2: 24 mmol/L (ref 22–32)
Calcium: 9.1 mg/dL (ref 8.9–10.3)
Chloride: 108 mmol/L (ref 98–111)
Creatinine, Ser: 0.93 mg/dL (ref 0.61–1.24)
GFR calc Af Amer: >60 mL/min (ref >60)
GFR calc non Af Amer: >60 mL/min (ref >60)
GLUCOSE: 269 mg/dL — AB (ref 70–99)
POTASSIUM: 4.3 mmol/L (ref 3.5–5.1)
SODIUM: 139 mmol/L (ref 135–145)
TOTAL PROTEIN: 6.5 g/dL (ref 6.5–8.1)

## 2018-02-24 LAB — CBC
HCT: 44.9 % (ref 40.0–52.0)
Hemoglobin: 15.8 g/dL (ref 13.0–18.0)
MCH: 31.9 pg (ref 26.0–34.0)
MCHC: 35.2 g/dL (ref 32.0–36.0)
MCV: 90.7 fL (ref 80.0–100.0)
PLATELETS: 205 10*3/uL (ref 150–440)
RBC: 4.95 MIL/uL (ref 4.40–5.90)
RDW: 13.5 % (ref 11.5–14.5)
WBC: 6 10*3/uL (ref 3.8–10.6)

## 2018-02-24 LAB — PROTIME-INR
INR: 0.88
PROTHROMBIN TIME: 11.9 s (ref 11.4–15.2)

## 2018-02-24 LAB — TROPONIN I: Troponin I: <0.03 ng/mL (ref <0.03)

## 2018-02-24 LAB — APTT: aPTT: 27 seconds (ref 24–36)

## 2018-02-24 LAB — GLUCOSE, CAPILLARY: GLUCOSE-CAPILLARY: 280 mg/dL — AB (ref 70–99)

## 2018-02-24 MED ORDER — ONDANSETRON HCL 4 MG/2ML IJ SOLN
4.0000 mg | INTRAMUSCULAR | Status: AC
  Administered 2018-02-24: 4 mg via INTRAVENOUS
  Filled 2018-02-24: qty 2

## 2018-02-24 MED ORDER — MORPHINE SULFATE (PF) 4 MG/ML IV SOLN
4.0000 mg | Freq: Once | INTRAVENOUS | Status: AC
  Administered 2018-02-24: 4 mg via INTRAVENOUS
  Filled 2018-02-24: qty 1

## 2018-02-24 NOTE — Discharge Instructions (Signed)
Your workup in the Emergency Department today was reassuring.  We did not find any specific abnormalities.  We recommend you drink plenty of fluids, take your regular medications and/or any new ones prescribed today, and follow up with the doctor(s) listed in these documents as recommended.  Return to the Emergency Department if you develop new or worsening symptoms that concern you.  

## 2018-02-24 NOTE — ED Notes (Signed)
Patient transported to Ultrasound 

## 2018-02-24 NOTE — ED Triage Notes (Signed)
First Nurse Note:  Patient arrives c/o aphasia and right sided numbness since approximately 1730.  Patient states right lower leg and been numb "on and off" all day.

## 2018-02-24 NOTE — ED Provider Notes (Signed)
[facility] Emergency Department Provider Note  ____________________________________________   First MD Initiated Contact with Patient 02/24/18 1841     (approximate)  I have reviewed the triage vital signs and the nursing notes.   HISTORY  Chief Complaint Code Stroke   Level 5 caveat:  history/ROS limited by acute/critical illness   HPI Michael Gill is a 57 y.o. male with a complicated medical history as listed below which includes multiple episodes that were attributed to TIAs, possible prior CVA, a prior right lower extremity DVT, chronic neuropathic leg pain, and pseudoseizures.  He presents by private vehicle for evaluation  of strokelike symptoms.  The patient cannot or will not provide much in the way of history.  He is reporting severe pain in his right leg, primarily the lower leg as well as some swelling.  He is also reporting some pain and swelling in his left leg but primarily it is right that is bothering him.  However he is also reporting pain in his right arm.  He alternates between stating that his arm and his leg are numb and that they are painful.  He is hesitant when he answers questions and seems to be having trouble with word finding.  He has no obvious facial droop but only occasionally when he talks well one side of his mouth droop downward and then it will correct.  He denies fever/chills, chest pain, shortness of breath, nausea, vomiting, and abdominal pain.  His history is vague, however, and the rest of it is provided by his wife who is at bedside.  She reports that they were at church after a normal day when he started having acute onset of the symptoms as described above, primarily the pain.  Many people in the congregation gathered around him and called her.    Past Medical History:  Diagnosis Date  . B12 deficiency   . CAD (coronary artery disease)    s/p MI  . Colon cancer ([place])   . CVA (cerebral infarction)   . Diabetes  mellitus ([place])   . DVT (deep venous thrombosis) ([place])   . Hyperlipidemia   . Neuropathic pain, leg   . Pseudoseizure   . Sleep apnea     Patient Active Problem List   Diagnosis Date Noted  . Deep vein thrombosis (DVT) of popliteal vein of right lower extremity ([place]) 10/30/2014  . TIA (transient ischemic attack) 01/19/2014    Past Surgical History:  Procedure Laterality Date  . APPENDECTOMY    . CARDIAC CATHETERIZATION Left 10/12/2015   Procedure: Left Heart Cath and Coronary Angiography;  Surgeon: Yolonda Kida, MD;  Location: [facility];  Service: Cardiovascular;  Laterality: Left;  . TONSILLECTOMY      Prior to Admission medications   Medication Sig Start Date End Date Taking? Authorizing Provider  acetaminophen (TYLENOL) 500 MG tablet Take 500 mg by mouth every 6 (six) hours as needed for moderate pain.   Yes [provider]  aspirin EC 81 MG tablet Take 81 mg by mouth every morning.    Yes [provider]  gabapentin (NEURONTIN) 300 MG capsule Take 100 mg by mouth 3 (three) times daily.    Yes [provider]  insulin aspart protamine- aspart (NOVOLOG MIX 70/30) (70-30) 100 UNIT/ML injection Inject 34 Units into the skin 2 (two) times daily.    Yes [provider]    Allergies Codeine; Shellfish allergy; and Sulfa antibiotics  Family History  Problem  Relation Age of Onset  . Alzheimer's disease Mother   . Cancer Brother     Social History Social History   Tobacco Use  . Smoking status: Never Smoker  . Smokeless tobacco: Never Used  Substance Use Topics  . Alcohol use: No  . Drug use: No    Review of Systems Level 5 caveat:  history/ROS limited by acute/critical illness, see HPI for details  ____________________________________________   PHYSICAL EXAM:  VITAL SIGNS: ED Triage Vitals  Enc Vitals Group     BP 02/24/18 1829 (!) 148/87     Pulse Rate 02/24/18 1829 82     Resp 02/24/18 1829 (!) 24     Temp  02/24/18 1829 98.4 F (36.9 C)     Temp Source 02/24/18 1829 Oral     SpO2 02/24/18 1829 99 %     Weight 02/24/18 1840 108.9 kg (240 lb)     Height 02/24/18 1840 1.778 m (5\' 10" )     Head Circumference --      Peak Flow --      Pain Score 02/24/18 1840 6     Pain Loc --      Pain Edu? --      Excl. in [place]? --     Constitutional: Alert and oriented.  Generally well-appearing but is moaning and seems to be in some distress from pain. Eyes: Conjunctivae are normal. PERRL. EOMI. initially patient will not open eyes but did so after I asked multiple times for him to open his eyes and look at me. Head: Atraumatic. Nose: No congestion/rhinnorhea. Mouth/Throat: Mucous membranes are moist. Neck: No stridor.  No meningeal signs.   Cardiovascular: Normal rate, regular rhythm. Good peripheral circulation. Grossly normal heart sounds. Respiratory: Normal respiratory effort.  No retractions. Lungs CTAB. Gastrointestinal: Soft and nontender. No distention.  Musculoskeletal: Trace pitting edema in bilateral lower extremities.  Patient reports severe tenderness to palpation of the right lower extremity.  No color change, no significant differentiation between the circumference of the 2 legs. No gross deformities of extremities. Neurologic: Inconsistent neurological exam.  Patient's participation is minimal and the results vary from exam to exam when exam components are repeated. NIHSS is a 5, but this is primarily due to cooperation and refusal to lift his RLE due to pain. Skin:  Skin is warm, dry and intact. No rash noted. Psychiatric: Mood and affect are somewhat anxious and depressed, minimally cooperative with exam and history.  ____________________________________________   LABS (all labs ordered are listed, but only abnormal results are displayed)  Labs Reviewed  GLUCOSE, CAPILLARY - Abnormal; Notable for the following components:      Result Value   Glucose-Capillary 280 (*)    All other  components within normal limits  COMPREHENSIVE METABOLIC PANEL - Abnormal; Notable for the following components:   Glucose, Bld 269 (*)    All other components within normal limits  PROTIME-INR  APTT  CBC  DIFFERENTIAL  TROPONIN I  URINE DRUG SCREEN, QUALITATIVE ([facility] ONLY)  CBG MONITORING, ED   ____________________________________________  EKG  ED ECG REPORT I, Hinda Kehr, the attending physician, personally viewed and interpreted this ECG.  Date: 02/24/2018 EKG Time: 18: 44 Rate: 76 Rhythm: normal sinus rhythm QRS Axis: normal Intervals: normal ST/T Wave abnormalities: normal Narrative Interpretation: no evidence of acute ischemia  ____________________________________________  RADIOLOGY I, Hinda Kehr, personally discussed these images and results by phone with the on-call radiologist and used this discussion as part of my medical decision  making.    ED MD interpretation:  No indication of acute stroke.  No evidence of DVT on ultrasound.  Official radiology report(s): US Venous Img Lower Bilateral  Result Date: 02/24/2018 CLINICAL DATA:  Bilateral lower extremity pain, swelling EXAM: BILATERAL LOWER EXTREMITY VENOUS DOPPLER ULTRASOUND TECHNIQUE: Gray-scale sonography with graded compression, as well as color Doppler and duplex ultrasound were performed to evaluate the lower extremity deep venous systems from the level of the common femoral vein and including the common femoral, femoral, profunda femoral, popliteal and calf veins including the posterior tibial, peroneal and gastrocnemius veins when visible. The superficial great saphenous vein was also interrogated. Spectral Doppler was utilized to evaluate flow at rest and with distal augmentation maneuvers in the common femoral, femoral and popliteal veins. COMPARISON:  None. FINDINGS: RIGHT LOWER EXTREMITY Common Femoral Vein: No evidence of thrombus. Normal compressibility, respiratory phasicity and response to  augmentation. Saphenofemoral Junction: No evidence of thrombus. Normal compressibility and flow on color Doppler imaging. Profunda Femoral Vein: No evidence of thrombus. Normal compressibility and flow on color Doppler imaging. Femoral Vein: No evidence of thrombus. Normal compressibility, respiratory phasicity and response to augmentation. Popliteal Vein: No evidence of thrombus. Normal compressibility, respiratory phasicity and response to augmentation. Calf Veins: No evidence of thrombus. Normal compressibility and flow on color Doppler imaging. Superficial Great Saphenous Vein: No evidence of thrombus. Normal compressibility. Venous Reflux:  None. Other Findings:  None. LEFT LOWER EXTREMITY Common Femoral Vein: No evidence of thrombus. Normal compressibility, respiratory phasicity and response to augmentation. Saphenofemoral Junction: No evidence of thrombus. Normal compressibility and flow on color Doppler imaging. Profunda Femoral Vein: No evidence of thrombus. Normal compressibility and flow on color Doppler imaging. Femoral Vein: No evidence of thrombus. Normal compressibility, respiratory phasicity and response to augmentation. Popliteal Vein: No evidence of thrombus. Normal compressibility, respiratory phasicity and response to augmentation. Calf Veins: No evidence of thrombus. Normal compressibility and flow on color Doppler imaging. Superficial Great Saphenous Vein: No evidence of thrombus. Normal compressibility. Venous Reflux:  None. Other Findings:  None. IMPRESSION: No evidence of deep venous thrombosis. Electronically Signed   By: Rolm Baptise M.D.   On: 02/24/2018 20:34   Ct Head Code Stroke Wo Contrast  Result Date: 02/24/2018 CLINICAL DATA:  Code stroke.  Aphasia with right-sided numbness EXAM: CT HEAD WITHOUT CONTRAST TECHNIQUE: Contiguous axial images were obtained from the base of the skull through the vertex without intravenous contrast. COMPARISON:  Brain MRI 05/25/2017 FINDINGS: Brain:  No evidence of acute infarction, hemorrhage, hydrocephalus, extra-axial collection or mass lesion/mass effect. Vascular: No hyperdense vessel or unexpected calcification. Skull: Normal. Negative for fracture or focal lesion. Sinuses/Orbits: Negative Other: These results were called by telephone at the time of interpretation on 02/24/2018 at 6:43 pm to Dr. Hinda Kehr , who verbally acknowledged these results. ASPECTS [facility] Stroke Program Early CT Score) - Ganglionic level infarction (caudate, lentiform nuclei, internal capsule, insula, M1-M3 cortex): 7 - Supraganglionic infarction (M4-M6 cortex): 3 Total score (0-10 with 10 being normal): 10 IMPRESSION: No acute finding. ASPECTS is 10. Electronically Signed   By: Monte Fantasia M.D.   On: 02/24/2018 18:43    ____________________________________________   PROCEDURES  Critical Care performed: Yes, see critical care procedure note(s)   Procedure(s) performed:   .Critical Care Performed by: Hinda Kehr, MD Authorized by: Hinda Kehr, MD   Critical care provider statement:    Critical care time (minutes):  30   Critical care time was exclusive of:  Separately billable procedures and  treating other patients   Critical care was necessary to treat or prevent imminent or life-threatening deterioration of the following conditions:  CNS failure or compromise   Critical care was time spent personally by me on the following activities:  Development of treatment plan with patient or surrogate, discussions with consultants, evaluation of patient's response to treatment, examination of patient, obtaining history from patient or surrogate, ordering and performing treatments and interventions, ordering and review of laboratory studies, ordering and review of radiographic studies, pulse oximetry, re-evaluation of patient's condition and review of old charts     ____________________________________________   INITIAL IMPRESSION / [place]  / ED COURSE  As part of my medical decision making, I reviewed the following data within the electronic MEDICAL RECORD NUMBER History obtained from family, Nursing notes reviewed and incorporated, Labs reviewed , EKG interpreted , Old chart reviewed, Discussed with radiologist, A consult was requested and obtained from this/these consultant(s) Neurology and Notes from prior ED visits    Differential diagnosis includes, but is not limited to, acute worsening of his peripheral neuropathy, musculoskeletal pain or strain, DVT, CVA, radiculopathy, etc.  I strongly do not feel that this presentation represents a CVA based on the presence of pain; I think that when he is hurting he has difficulty finding words and expressing himself rather than this being reflective of an acute neurological compromise.  I reviewed the medical record indicates he had a similar presentation with a normal head CT and MRI, this occurred about 9 months ago.  The presence of pseudoseizures on his history also suggest to me that there could be a psychiatric component.  However, given that the onset of the symptoms was about 70 minutes ago, I will proceed with acute neurological consultation and code stroke plan with the expectation that we can back off.  I do not think he would be a good candidate for TPA but I appreciate the recommendations of the specialist.  I spoke with the radiologist by phone who call me to let me know that the head CT without contrast is negative.   Clinical Course as of Feb 25 2232  Nancy Fetter Feb 24, 2018  1949 I spoke with phone with the neurologist who evaluated the patient remotely.  He agrees with me that there is no suggestion that the patient's presentation represents a CVA.  He recommends further evaluation of the possibility of a DVT in the right lower extremity.  He also pointed out the patient's history of pseudoseizures and similar presentation with a normal MRI about 9 months ago.Given the patient's pain  which I suspect is contributing to or causing his strokelike symptoms and difficulty with speech, I am giving morphine 4 mill grams IV and Zofran 4 mg IV and will obtain bilateral lower extremely Dopplers to rule out DVT.   [CF]  2049 CMP is normal except for hyperglycemia.  The rest of his lab work is all within normal limits as well.   [CF]  2049 No evidence of deep vein thrombosis.  US Venous Img Lower Bilateral [CF]  2221 The patient is completely back to his baseline.  He is happy, joking with me, and in no distress.  He reports that his leg continues to hurt but it is much better than before and is more consistent with his chronic neuropathy.I explained to the patient his wife that I see no evidence of any acute or emergent medical condition at this time.  Given that he is ambulatory and in  no acute distress, I suggested that he could be discharged and follow-up as an outpatient with his neurologist and he happily agreed with this plan.  I gave my usual and customary return precautions and he and his wife understand and agree with the plan.  At this point I do not think that there is any indication for an MRI either of his brain or of his lumbar spine given the inconsistencies in the prior exams and the fact that he had an MRI under previous circumstances before and the fact that there is no evidence that his presentation today is at all consistent with CVA.   [CF]    Clinical Course User Index [CF] Hinda Kehr, MD    ____________________________________________  FINAL CLINICAL IMPRESSION(S) / ED DIAGNOSES  Final diagnoses:  Right leg pain  Other polyneuropathy     MEDICATIONS GIVEN DURING THIS VISIT:  Medications  morphine 4 MG/ML injection 4 mg (4 mg Intravenous Given 02/24/18 1929)  ondansetron (ZOFRAN) injection 4 mg (4 mg Intravenous Given 02/24/18 1929)     ED Discharge Orders    None       Note:  This document was prepared using Dragon voice recognition software and  may include unintentional dictation errors.    Hinda Kehr, MD 02/24/18 2233

## 2018-02-24 NOTE — ED Notes (Signed)
Airway clearance for CT scan performed by Dr. Cinda Quest at this time.

## 2018-02-24 NOTE — Consult Note (Signed)
    [facility] TeleNeurology Consult Services  Date of Service: 02/25/2018  Impression:  1. No evidence of acute ischemic stroke. The onset of severe unilateral limb pain is not consistent with this 2. Suggest enough psychosomatic overlay. Note: patient has a history of pseudoseizures. 3. Must rule out peripheral abnormality including DVT.    Not a tPA candidate due to: no evidence of acute stroke Not clearly an NIR candidate due to: no evidence of large vessel occlusion  Differential Diagnosis:   1. Cardioembolic stroke  2. Small vessel disease/lacune  3. Thromboembolic, artery-to-artery mechanism  4. Hypercoagulable state-related infarct  5. Transient ischemic attack  6. Thrombotic mechanism, large artery disease  7. Not neurologic in origin   Comments:   [facility] contacted: 1848 [facility] at bedside: 1855 NIHSS assessment time: 1910  Recommendations:   Inpatient Neurology consultation venous Doppler of legs general medical examination  ----------------------------------------------------------------------------------------- CC: right leg pain  History of Present Illness: this is a 57 year old man was last normal at 1730 when he developed worsening of right leg pain with numbness and weakness. He's had some pain the whole day. He has trouble finding words. He has similar episode in October 2018 in a negative evaluation. Three years ago he had a DVT of the right lower extremity treated with Eliquis which was stopped after of an appropriate amount of time. He has no history of hypertension. He is diabetic. He has coronary disease pretty has hyperlipidemia. He is not anticoagulated. He took some gabapentin and Tylenol today.   Diagnostic:  Exam  Level of consciousness: alert, keenly responsive = 0 Level of consciousness: month and age: both questions correct = 0 Level of consciousness: commands: performs both tasks = 0 Horizontal EOMs: normal =  0 Visual fields: normal = 0 Test facial palsy: normal symmetry = 0 L arm motor drift: no drift for 10 seconds = 0 R arm motor drift: no drift for 10 seconds = 0 L leg motor drift: no drift for five seconds = 0 R leg motor drift: no drift five seconds = 1 Limb ataxia: (FNF/Heel-Shin): no ataxia = 0 Sensation: normal; no sensory loss = 1 Language/Aphasia: normal: no aphasia = 0 Dysarthria: normal = 0 Extinction/Inattention = 0  NIHSS = 2   Medical Decision Making:  - Extensive number of diagnosis or management options are considered above.   - Extensive amount of complex data reviewed.   - High risk of complication and/or morbidity or mortality are associated with differential diagnostic considerations above.  - There may be Uncertain outcome and increased probability of prolonged functional impairment or high probability of severe prolonged functional impairment associated with some of these differential diagnosis.   Medical Data Reviewed:  1.Data reviewed include clinical labs, radiology,  Medical Tests;   2.Tests results discussed w/performing or interpreting physician;   3.Obtaining/reviewing old medical records;  4.Obtaining case history from another source;  5.Independent review of image, tracing or specimen.    Patient was informed the Neurology Consult would happen via TeleHealth consult by way of interactive audio and video telecommunications and consented to receiving care in this manner.

## 2018-02-24 NOTE — Progress Notes (Signed)
   02/24/18 1835  Clinical Encounter Type  Visited With Patient and family together  Visit Type Initial;Spiritual support;ED  Referral From Nurse  Consult/Referral To Chaplain  Spiritual Encounters  Spiritual Needs Prayer;Emotional   [place] received a code stroke page for ED03. [place] reported to the patient's room and Michael Gill was being examined. The patient's wife was present and attempted to answer question for the doctor. Michael Gill was in a lot of pain in his right leg. I provided pastoral presences and prayed for the patient at his request. I will follow up as needed.

## 2018-02-24 NOTE — Progress Notes (Signed)
CODE STROKE- PHARMACY COMMUNICATION   Time CODE STROKE called/page received:@ 1835  Time response to CODE STROKE was made (in person or via phone): _0   Time Stroke Kit retrieved from [place] (only if needed):NA  Name of Provider/Nurse contacted:Ka[place]   Past Medical History:  Diagnosis Date  . B12 deficiency   . CAD (coronary artery disease)    s/p MI  . Colon cancer ([place])   . CVA (cerebral infarction)   . Diabetes mellitus ([place])   . DVT (deep venous thrombosis) ([place])   . Hyperlipidemia   . Neuropathic pain, leg   . Pseudoseizure   . Sleep apnea    Prior to Admission medications   Medication Sig Start Date End Date Taking? Authorizing Provider  acetaminophen (TYLENOL) 500 MG tablet Take 500 mg by mouth every 6 (six) hours as needed for moderate pain.   Yes [provider]  aspirin EC 81 MG tablet Take 81 mg by mouth every morning.    Yes [provider]  gabapentin (NEURONTIN) 300 MG capsule Take 100 mg by mouth 3 (three) times daily.    Yes [provider]  insulin aspart protamine- aspart (NOVOLOG MIX 70/30) (70-30) 100 UNIT/ML injection Inject 34 Units into the skin 2 (two) times daily.    Yes [provider]  albuterol (PROVENTIL HFA;VENTOLIN HFA) 108 (90 Base) MCG/ACT inhaler Inhale 2 puffs into the lungs every 6 (six) hours as needed for wheezing or shortness of breath. 08/24/16 02/24/18  Nance Pear, MD  atorvastatin (LIPITOR) 10 MG tablet Take 10 mg by mouth every evening.   02/24/18  [provider]  ondansetron (ZOFRAN ODT) 4 MG disintegrating tablet Allow 1-2 tablets to dissolve in your mouth every 8 hours as needed for nausea/vomiting 10/15/16 02/24/18  Hinda Kehr, MD  oxyCODONE-acetaminophen (ROXICET) 5-325 MG tablet Take 1 tablet every 4 (four) hours as needed by mouth for severe pain. 06/16/17 02/24/18  Paulette Blanch, MD  traZODone (DESYREL) 50 MG tablet Take 50-100 mg by mouth at bedtime as needed for sleep.  02/24/18   [provider]    Pernell Dupre ,PharmD Clinical Pharmacist  02/24/2018  7:19 PM

## 2018-02-24 NOTE — ED Notes (Signed)
Patient assisted to bathroom 

## 2018-02-24 NOTE — ED Notes (Signed)
MD at bedside. 

## 2018-02-24 NOTE — ED Notes (Signed)
Patient speaking with tele-neuro at this time.

## 2018-02-24 NOTE — ED Notes (Signed)
Patient back from ultrasound

## 2018-04-04 ENCOUNTER — Emergency Department: Payer: Medicare HMO

## 2018-04-04 ENCOUNTER — Emergency Department
Admission: EM | Admit: 2018-04-04 | Discharge: 2018-04-04 | Disposition: A | Discharge status: Home / Self Care | Payer: Medicare HMO | Attending: Student in an Organized Health Care Education/Training Program | Admitting: Student in an Organized Health Care Education/Training Program

## 2018-04-04 ENCOUNTER — Other Ambulatory Visit: Payer: Self-pay

## 2018-04-04 DIAGNOSIS — Z85038 Personal history of other malignant neoplasm of large intestine: Secondary | ICD-10-CM | POA: Diagnosis not present

## 2018-04-04 DIAGNOSIS — E119 Type 2 diabetes mellitus without complications: Secondary | ICD-10-CM | POA: Insufficient documentation

## 2018-04-04 DIAGNOSIS — Z79899 Other long term (current) drug therapy: Secondary | ICD-10-CM | POA: Insufficient documentation

## 2018-04-04 DIAGNOSIS — R51 Headache: Secondary | ICD-10-CM | POA: Diagnosis present

## 2018-04-04 DIAGNOSIS — R29810 Facial weakness: Secondary | ICD-10-CM | POA: Diagnosis not present

## 2018-04-04 DIAGNOSIS — Z8673 Personal history of transient ischemic attack (TIA), and cerebral infarction without residual deficits: Secondary | ICD-10-CM | POA: Insufficient documentation

## 2018-04-04 DIAGNOSIS — Z86718 Personal history of other venous thrombosis and embolism: Secondary | ICD-10-CM | POA: Diagnosis not present

## 2018-04-04 DIAGNOSIS — Z7982 Long term (current) use of aspirin: Secondary | ICD-10-CM | POA: Diagnosis not present

## 2018-04-04 DIAGNOSIS — R519 Headache, unspecified: Secondary | ICD-10-CM

## 2018-04-04 DIAGNOSIS — Z794 Long term (current) use of insulin: Secondary | ICD-10-CM | POA: Insufficient documentation

## 2018-04-04 DIAGNOSIS — R531 Weakness: Secondary | ICD-10-CM | POA: Diagnosis not present

## 2018-04-04 DIAGNOSIS — R4789 Other speech disturbances: Secondary | ICD-10-CM | POA: Diagnosis not present

## 2018-04-04 DIAGNOSIS — I2510 Atherosclerotic heart disease of native coronary artery without angina pectoris: Secondary | ICD-10-CM | POA: Insufficient documentation

## 2018-04-04 LAB — URINALYSIS, ROUTINE W REFLEX MICROSCOPIC
Bacteria, UA: NONE SEEN
Bilirubin Urine: NEGATIVE
HGB URINE DIPSTICK: NEGATIVE
Ketones, ur: NEGATIVE mg/dL
LEUKOCYTES UA: NEGATIVE
Nitrite: NEGATIVE
PH: 6 (ref 5.0–8.0)
Protein, ur: NEGATIVE mg/dL
SPECIFIC GRAVITY, URINE: 1.02 (ref 1.005–1.030)
SQUAMOUS EPITHELIAL / LPF: NONE SEEN (ref 0–5)

## 2018-04-04 LAB — COMPREHENSIVE METABOLIC PANEL
ALK PHOS: 49 U/L (ref 38–126)
ALT: 26 U/L (ref 0–44)
AST: 21 U/L (ref 15–41)
Albumin: 3.6 g/dL (ref 3.5–5.0)
Anion gap: 6 (ref 5–15)
BUN: 15 mg/dL (ref 6–20)
CO2: 25 mmol/L (ref 22–32)
Calcium: 9.1 mg/dL (ref 8.9–10.3)
Chloride: 105 mmol/L (ref 98–111)
Creatinine, Ser: 0.94 mg/dL (ref 0.61–1.24)
GFR calc Af Amer: >60 mL/min (ref >60)
Glucose, Bld: 248 mg/dL — ABNORMAL HIGH (ref 70–99)
POTASSIUM: 4 mmol/L (ref 3.5–5.1)
Sodium: 136 mmol/L (ref 135–145)
Total Bilirubin: 0.8 mg/dL (ref 0.3–1.2)
Total Protein: 6.5 g/dL (ref 6.5–8.1)

## 2018-04-04 LAB — DIFFERENTIAL
BASOS ABS: 0 10*3/uL (ref 0–0.1)
Basophils Relative: 0 %
EOS ABS: 0.1 10*3/uL (ref 0–0.7)
Eosinophils Relative: 3 %
LYMPHS PCT: 30 %
Lymphs Abs: 1.4 10*3/uL (ref 1.0–3.6)
Monocytes Absolute: 0.4 10*3/uL (ref 0.2–1.0)
Monocytes Relative: 10 %
Neutro Abs: 2.6 10*3/uL (ref 1.4–6.5)
Neutrophils Relative %: 57 %

## 2018-04-04 LAB — CBC
HEMATOCRIT: 26.6 % — AB (ref 40.0–52.0)
HEMOGLOBIN: 9.5 g/dL — AB (ref 13.0–18.0)
MCH: 32.5 pg (ref 26.0–34.0)
MCHC: 35.5 g/dL (ref 32.0–36.0)
MCV: 91.5 fL (ref 80.0–100.0)
Platelets: 126 10*3/uL — ABNORMAL LOW (ref 150–440)
RBC: 2.91 MIL/uL — ABNORMAL LOW (ref 4.40–5.90)
RDW: 12.9 % (ref 11.5–14.5)
WBC: 4.5 10*3/uL (ref 3.8–10.6)

## 2018-04-04 LAB — URINE DRUG SCREEN, QUALITATIVE (ARMC ONLY)
Amphetamines, Ur Screen: NOT DETECTED
BARBITURATES, UR SCREEN: NOT DETECTED
BENZODIAZEPINE, UR SCRN: NOT DETECTED
CANNABINOID 50 NG, UR ~~LOC~~: NOT DETECTED
COCAINE METABOLITE, UR ~~LOC~~: NOT DETECTED
MDMA (Ecstasy)Ur Screen: NOT DETECTED
METHADONE SCREEN, URINE: NOT DETECTED
OPIATE, UR SCREEN: NOT DETECTED
Phencyclidine (PCP) Ur S: NOT DETECTED
Tricyclic, Ur Screen: NOT DETECTED

## 2018-04-04 LAB — GLUCOSE, CAPILLARY: GLUCOSE-CAPILLARY: 251 mg/dL — AB (ref 70–99)

## 2018-04-04 LAB — ETHANOL: Alcohol, Ethyl (B): <10 mg/dL (ref <10)

## 2018-04-04 LAB — TROPONIN I

## 2018-04-04 LAB — PROTIME-INR
INR: 0.93
Prothrombin Time: 12.4 seconds (ref 11.4–15.2)

## 2018-04-04 LAB — APTT: aPTT: 28 seconds (ref 24–36)

## 2018-04-04 MED ORDER — ALTEPLASE 100 MG IV SOLR
INTRAVENOUS | Status: AC
  Filled 2018-04-04: qty 100

## 2018-04-04 MED ORDER — LABETALOL HCL 5 MG/ML IV SOLN
INTRAVENOUS | Status: AC
  Filled 2018-04-04: qty 4

## 2018-04-04 MED ORDER — IOPAMIDOL (ISOVUE-370) INJECTION 76%
100.0000 mL | Freq: Once | INTRAVENOUS | Status: AC | PRN
  Administered 2018-04-04: 100 mL via INTRAVENOUS

## 2018-04-04 NOTE — ED Triage Notes (Addendum)
Pt to ED via [place]. EMS picked up pt from [facility] where pt states he was having severe headache and left sided weakness. Pt has history of CVA and mutiple TIAs. Pt states how he feels right now is how he presented with TIAs. EMS stated pt was given glucose tablets at clinic where he felt like his blood sugar was low. CBG 275 83 heart rate. Pt taking asprin

## 2018-04-04 NOTE — Discharge Instructions (Signed)

## 2018-04-04 NOTE — ED Notes (Signed)
Pt sleeping.  Family with pt 

## 2018-04-04 NOTE — ED Notes (Signed)
Patient discharged to home per MD order. Patient in stable condition, and deemed medically cleared by ED provider for discharge. Discharge instructions reviewed with patient/family using "Teach Back"; verbalized understanding of medication education and administration, and information about follow-up care. Denies further concerns. ° °

## 2018-04-04 NOTE — ED Provider Notes (Signed)
[facility] Emergency Department Provider Note    First MD Initiated Contact with Patient 04/04/18 1639     (approximate)  I have reviewed the triage vital signs and the nursing notes.   HISTORY  Chief Complaint Headache  Level V Caveat:  AMS  HPI Michael Gill is a 57 y.o. male with a history of pseudoseizure as well as CVA presents the ER with left-sided headache as well as difficulty finding his words reported left-sided weakness and left facial droop.  Onset of symptoms was roughly 1 hour prior to arrival while he was at clinic.  Patient evaluated in ER bed 5 currently protecting his airway.  Code stroke called due to deficits in onset of symptoms.  Following simple commands but a phasic and not providing any additional history.    Past Medical History:  Diagnosis Date  . B12 deficiency   . CAD (coronary artery disease)    s/p MI  . Colon cancer ([place])   . CVA (cerebral infarction)   . Diabetes mellitus ([place])   . DVT (deep venous thrombosis) ([place])   . Hyperlipidemia   . Neuropathic pain, leg   . Pseudoseizure   . Sleep apnea    Family History  Problem Relation Age of Onset  . Alzheimer's disease Mother   . Cancer Brother    Past Surgical History:  Procedure Laterality Date  . APPENDECTOMY    . CARDIAC CATHETERIZATION Left 10/12/2015   Procedure: Left Heart Cath and Coronary Angiography;  Surgeon: Yolonda Kida, MD;  Location: [place] [facility];  Service: Cardiovascular;  Laterality: Left;  . TONSILLECTOMY     Patient Active Problem List   Diagnosis Date Noted  . Deep vein thrombosis (DVT) of popliteal vein of right lower extremity ([place]) 10/30/2014  . TIA (transient ischemic attack) 01/19/2014      Prior to Admission medications   Medication Sig Start Date End Date Taking? Authorizing Provider  acetaminophen (TYLENOL) 500 MG tablet Take 500 mg by mouth every 6 (six) hours as needed for moderate pain.   Yes [provider]  aspirin EC 81 MG tablet Take 81 mg by mouth every morning.    Yes [provider]  atorvastatin (LIPITOR) 10 MG tablet Take 10 mg by mouth daily.   Yes [provider]  gabapentin (NEURONTIN) 300 MG capsule Take 100 mg by mouth 3 (three) times daily.    Yes [provider]  insulin aspart protamine- aspart (NOVOLOG MIX 70/30) (70-30) 100 UNIT/ML injection Inject 37 Units into the skin 2 (two) times daily.    Yes [provider]  meclizine (ANTIVERT) 25 MG tablet Take 25 mg by mouth 3 (three) times daily as needed for dizziness.   Yes [provider]    Allergies Codeine; Shellfish allergy; and Sulfa antibiotics    Social History Social History   Tobacco Use  . Smoking status: Never Smoker  . Smokeless tobacco: Never Used  Substance Use Topics  . Alcohol use: No  . Drug use: No    Review of Systems Patient denies headaches, rhinorrhea, blurry vision, numbness, shortness of breath, chest pain, edema, cough, abdominal pain, nausea, vomiting, diarrhea, dysuria, fevers, rashes or hallucinations unless otherwise stated above in HPI. ____________________________________________   PHYSICAL EXAM:  VITAL SIGNS: Vitals:   04/04/18 1900 04/04/18 1920  BP: 125/77 140/80  Pulse:  65  Resp: 15 12  Temp:    SpO2:  98%    Constitutional: Alert ,  will track and follow simpl commands, protecting his airway Eyes: Conjunctivae are normal.  Head: Atraumatic. Nose: No congestion/rhinnorhea. Mouth/Throat: Mucous membranes are moist.   Neck: No stridor. Painless ROM.  Cardiovascular: Normal rate, regular rhythm. Grossly normal heart sounds.  Good peripheral circulation. Respiratory: Normal respiratory effort.  No retractions. Lungs CTAB. Gastrointestinal: Soft and nontender. No distention. No abdominal bruits. No CVA tenderness. Genitourinary:  Musculoskeletal: No lower extremity tenderness nor edema.  No joint effusions. Neurologic:   Nonverbal, understands and follows simple commands, No drift,  + left facial droop.  Marland Kitchen MAE spontaneously Skin:  Skin is warm, dry and intact. No rash noted. Psychiatric: unalble to assess 2/2 acuity of condition ____________________________________________   LABS (all labs ordered are listed, but only abnormal results are displayed)  Results for orders placed or performed during the hospital encounter of 04/04/18 (from the past 24 hour(s))  Glucose, capillary     Status: Abnormal   Collection Time: 04/04/18  4:33 PM  Result Value Ref Range   Glucose-Capillary 251 (H) 70 - 99 mg/dL  Protime-INR     Status: None   Collection Time: 04/04/18  4:40 PM  Result Value Ref Range   Prothrombin Time 12.4 11.4 - 15.2 seconds   INR 0.93   APTT     Status: None   Collection Time: 04/04/18  4:40 PM  Result Value Ref Range   aPTT 28 24 - 36 seconds  CBC     Status: Abnormal   Collection Time: 04/04/18  4:40 PM  Result Value Ref Range   WBC 4.5 3.8 - 10.6 K/uL   RBC 2.91 (L) 4.40 - 5.90 MIL/uL   Hemoglobin 9.5 (L) 13.0 - 18.0 g/dL   HCT 26.6 (L) 40.0 - 52.0 %   MCV 91.5 80.0 - 100.0 fL   MCH 32.5 26.0 - 34.0 pg   MCHC 35.5 32.0 - 36.0 g/dL   RDW 12.9 11.5 - 14.5 %   Platelets 126 (L) 150 - 440 K/uL  Differential     Status: None   Collection Time: 04/04/18  4:40 PM  Result Value Ref Range   Neutrophils Relative % 57 %   Neutro Abs 2.6 1.4 - 6.5 K/uL   Lymphocytes Relative 30 %   Lymphs Abs 1.4 1.0 - 3.6 K/uL   Monocytes Relative 10 %   Monocytes Absolute 0.4 0.2 - 1.0 K/uL   Eosinophils Relative 3 %   Eosinophils Absolute 0.1 0 - 0.7 K/uL   Basophils Relative 0 %   Basophils Absolute 0.0 0 - 0.1 K/uL  Comprehensive metabolic panel     Status: Abnormal   Collection Time: 04/04/18  4:40 PM  Result Value Ref Range   Sodium 136 135 - 145 mmol/L   Potassium 4.0 3.5 - 5.1 mmol/L   Chloride 105 98 - 111 mmol/L   CO2 25 22 - 32 mmol/L   Glucose, Bld 248 (H) 70 - 99 mg/dL   BUN 15 6 - 20  mg/dL   Creatinine, Ser 0.94 0.61 - 1.24 mg/dL   Calcium 9.1 8.9 - 10.3 mg/dL   Total Protein 6.5 6.5 - 8.1 g/dL   Albumin 3.6 3.5 - 5.0 g/dL   AST 21 15 - 41 U/L   ALT 26 0 - 44 U/L   Alkaline Phosphatase 49 38 - 126 U/L   Total Bilirubin 0.8 0.3 - 1.2 mg/dL   GFR calc non Af Amer >60 >60 mL/min   GFR calc Af Amer >60 >60  mL/min   Anion gap 6 5 - 15  Troponin I     Status: None   Collection Time: 04/04/18  4:40 PM  Result Value Ref Range   Troponin I <0.03 <0.03 ng/mL  Ethanol     Status: None   Collection Time: 04/04/18  4:40 PM  Result Value Ref Range   Alcohol, Ethyl (B) <10 <10 mg/dL  Urine Drug Screen, Qualitative     Status: None   Collection Time: 04/04/18  5:19 PM  Result Value Ref Range   Tricyclic, Ur Screen NONE DETECTED NONE DETECTED   Amphetamines, Ur Screen NONE DETECTED NONE DETECTED   MDMA (Ecstasy)Ur Screen NONE DETECTED NONE DETECTED   Cocaine Metabolite,Ur [place] NONE DETECTED NONE DETECTED   Opiate, Ur Screen NONE DETECTED NONE DETECTED   Phencyclidine (PCP) Ur S NONE DETECTED NONE DETECTED   Cannabinoid 50 Ng, Ur  NONE DETECTED NONE DETECTED   Barbiturates, Ur Screen NONE DETECTED NONE DETECTED   Benzodiazepine, Ur Scrn NONE DETECTED NONE DETECTED   Methadone Scn, Ur NONE DETECTED NONE DETECTED  Urinalysis, Routine w reflex microscopic     Status: Abnormal   Collection Time: 04/04/18  5:19 PM  Result Value Ref Range   Color, Urine STRAW (A) YELLOW   APPearance CLEAR (A) CLEAR   Specific Gravity, Urine 1.020 1.005 - 1.030   pH 6.0 5.0 - 8.0   Glucose, UA >=500 (A) NEGATIVE mg/dL   Hgb urine dipstick NEGATIVE NEGATIVE   Bilirubin Urine NEGATIVE NEGATIVE   Ketones, ur NEGATIVE NEGATIVE mg/dL   Protein, ur NEGATIVE NEGATIVE mg/dL   Nitrite NEGATIVE NEGATIVE   Leukocytes, UA NEGATIVE NEGATIVE   WBC, UA 0-5 0 - 5 WBC/hpf   Bacteria, UA NONE SEEN NONE SEEN   Squamous Epithelial / LPF NONE SEEN 0 - 5    ____________________________________________  EKG My review and personal interpretation at Time: 16:32   Indication: ams  Rate: 80  Rhythm: sinus Axis: normal Other: normal intervals, no stemi ____________________________________________  RADIOLOGY  I personally reviewed all radiographic images ordered to evaluate for the above acute complaints and reviewed radiology reports and findings.  These findings were personally discussed with the patient.  Please see medical record for radiology report.  ____________________________________________   PROCEDURES  Procedure(s) performed:  Procedures    Critical Care performed: no ____________________________________________   INITIAL IMPRESSION / ASSESSMENT AND PLAN / ED COURSE  Pertinent labs & imaging results that were available during my care of the patient were reviewed by me and considered in my medical decision making (see chart for details).   DDX: cva, tia, hypoglycemia, dehydration, electrolyte abnormality, dissection, sepsis   Michael Gill is a 57 y.o. who presents to the ED with presents the ER for symptoms as described above.  Will make code stroke based on onset of symptoms however patient with very many presentation for similar episodes in the past.  Blood will be sent for the above differential.  Will take emergently to CT imaging.  The patient will be placed on continuous pulse oximetry and telemetry for monitoring.  Laboratory evaluation will be sent to evaluate for the above complaints.     Clinical Course as of Apr 04 2041  Thu Apr 04, 2018  1728 After receiving additional information from family does appear the patient is outside of the window for TPA.  We will give aspirin.  CTA and perfusion were normal.  Possible pseudoseizure.  Does not seem clinically consistent with CVA.   [  PR]  1745 Symptoms have resolved.  Patient back to baseline.  CT perfusion scan normal.  Will order MRI.  Do not feel this is  clinically consistent with CVA but will evaluate for infarct or other evidence of inflammatory or demyelinating process.   [PR]  2036 MRI shows no evidence of acute ischemia.  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  Will give referral to ear nose and throat.   [PR]  2038 Noted mild anemia on CBC is new from previous.  Patient remains Heema dynamically stable.  No melena or hematochezia.  No trauma.  Will follow up with PCP.  Discussed signs and symptoms for which he should return to the ER.  Patient was able to tolerate PO and was able to ambulate with a steady gait.  Have discussed with the patient and available family all diagnostics and treatments performed thus far and all questions were answered to the best of my ability. The patient demonstrates understanding and agreement with plan.    [PR]    Clinical Course User Index [PR] Merlyn Lot, MD     As part of my medical decision making, I reviewed the following data within the [place] notes reviewed and incorporated, Labs reviewed, notes from prior ED visits.   ____________________________________________   FINAL CLINICAL IMPRESSION(S) / ED DIAGNOSES  Final diagnoses:  Bad headache      NEW MEDICATIONS STARTED DURING THIS VISIT:  New Prescriptions   No medications on file     Note:  This document was prepared using Dragon voice recognition software and may include unintentional dictation errors.    Merlyn Lot, MD 04/04/18 2043

## 2018-04-04 NOTE — ED Notes (Signed)
Patient transported to MRI 

## 2018-04-04 NOTE — ED Notes (Signed)
Code stroke called to  [phone]  RN activated teleneuro

## 2018-04-04 NOTE — Consult Note (Addendum)
[facility] Consult Services Impression:   Diabetic, and got dizzy, and then started to get very dizzy, then he started to shake.  The patient started to become symptomatic at 12:00 noon, but unsure what the story is.  Ed, MD agrees to no tpa because he is outside of window for tPA.  Perfusion study is negative, and CTA head/neck is negative. Previously the patient had some level of psychiatric overlay.  Previously he had low blood sugar, and was given tablets of sugar for diabetes.   MRI of brain in AM, no core infarct or ischemia on CT perfusion.    #1. Sudden onset headache, and nutrition.  #2. He has had CVA and TIa,  #3. Asprin and TIA.  LKN is unkown between 12:00 noon and now. (No tPA) CTA head/neck and CT perfusion is negative. #4. No TPA because of severe headache, sudden onset,  and concern for lkn is unkown since 12 noon so outside of window for tpa.  #5. Consider LP if headache does not subside. There may be some level of psychosomatic overlay.   Comments: Door TIme: 1628 @1658 , Paged @1700 , Online   Recommendations:  <220/110   Start antiplatelet if no obvious contraindication Stroke protocol admission/ orderset suggested with placement on stroke floor tele monitoring Bedside swallow evaluation HOB less than 30 degrees IV Fluid hydration with NS Euglycemia avoid hyperthermia, PRN acetaminophen dvt ppx Consider inpatient neurology consultation Discussed with ED MD Please call with questions ----------------------------------------------------------------------------------------- CC History of Present Illness   The patient developed a severe, headache, and was dizzy and light headed, at around noon. But last known normal is really not konwn as he cannot remember when started but it was between 12-1 started to feel light headed and dizzy. Discussed no tPA with family and patient.  Diagnostic: CT head is negative.  Exam: Patient is in no  apparent distress. Patient appears as stated age. No obvious acute respiratory or cardiac distress. Patient is well groomed and well-nourished. NIHSS score:  1A: Level of Consciousness - Requires repeated stimulation to arouse 0 1B: Ask Month and Age - 0 Questions Right 0 1C: 'Blink Eyes' & 'Squeeze Hands' - Performs 0 Tasks 0 2: Test Horizontal Extraocular Movements - Partial Gaze Palsy: Corrects with Oculocephalic Reflex 0 3: Test Visual Fields - No Visual Loss 0 4: Test Facial Palsy - Normal symmetry 0 5A: Test Left Arm Motor Drift - Some Effort Against Gravity 0 5B: Test Right Arm Motor Drift - Some Effort Against Gravity0 6A: Test Left Leg Motor Drift - Some Effort Against Gravity 3 6B: Test Right Leg Motor Drift - Some Effort Against Gravity 3  7: Test Limb Ataxia - Ataxia in 2 Limbs 0 8: Test Sensation - Complete Loss: Cannot Sense Being Touched At All 0 9: Test Language/Aphasia- Severe Aphasia: Fragmentary Expression, Inference Needed, Cannot Identify Materials 0 10: Test Dysarthria - Mute/Anarthric 0 11: Test Extinction/Inattention - Extinction to bilateral simultaneous stimulation 0    Medical Decision Making: - Extensive number of diagnosis or management options are considered above. - Extensive amount of complex data reviewed. - High risk of complication and/or morbidity or mortality are associated with differential diagnostic considerations above. - There may be Uncertain outcome and increased probability of prolonged functional impairment or high probability of severe prolonged functional impairment associated with some of these differential diagnosis. Medical Data Reviewed: 1.Data reviewed include clinical labs, radiology,Medical Tests; 2.Tests results discussed w/performing or interpreting physician; 3.Obtaining/reviewing old medical records; 4.Obtaining case history from  another source; 5.Independent review of image, tracing or specimen

## 2018-04-04 NOTE — Progress Notes (Signed)
Chaplain received a page for a Code STROKE. Upon arrival to the patient's room, his wife and nurses were talking about what had transpired. Chaplain talked with Mrs. Hewitt about her husband and some of the stressful life events that have recently happened. She said that they are people of faith whom "God has been so go to already". Chaplain engaged in active and reflective listening with Mrs. Robina Ade until Mr. Traweek returned from [place]. During medical assessment, chaplain offered silent prayer for the patient's health and when an opportunity became available, went and greeted the patient. Patient not a candidate for TPA and sent to CT for further testing. Mrs. Highley thanked chaplain for support and will call/page if necessary.    04/04/18 1700  Clinical Encounter Type  Visited With Patient and family together  Visit Type Code  Spiritual Encounters  Spiritual Needs Emotional

## 2018-08-22 ENCOUNTER — Other Ambulatory Visit: Payer: Self-pay | Admitting: Family Medicine

## 2018-08-22 DIAGNOSIS — Z0000 Encounter for general adult medical examination without abnormal findings: Secondary | ICD-10-CM

## 2018-09-09 ENCOUNTER — Encounter: Payer: Self-pay | Admitting: *Deleted

## 2018-09-29 DIAGNOSIS — R0600 Dyspnea, unspecified: Secondary | ICD-10-CM

## 2018-09-29 HISTORY — DX: Dyspnea, unspecified: R06.00

## 2018-10-01 ENCOUNTER — Other Ambulatory Visit: Payer: Medicare HMO

## 2018-10-14 ENCOUNTER — Ambulatory Visit: Payer: Self-pay | Admitting: Surgery

## 2018-10-14 NOTE — H&P (Signed)
Subjective:   CC: Lipoma of right upper extremity [D17.21]  HPI:  Michael Gill is a 58 y.o. male who was referred by Melonie Florida, MD for evaluation of above. First noted 1 year ago.  Symptoms include: Pain is sharp, localized over the lumps.  Exacerbated by palpation.  Alleviated by nothing specific.  Associated with nothing specific such as overlying skin changes.     Past Medical History:  has a past medical history of Cancer (CMS-HCC), Chronic pain, Conversion disorder (2010), Diabetes mellitus type 2, uncomplicated (CMS-HCC), CWCBJSEG(315.1), Migraine headache, Myocardial infarction (CMS-HCC), Peripheral neuropathy, Seizures (CMS-HCC), Sleep apnea, Stroke (CMS-HCC), Tremor, and Viral meningitis (2016).  Past Surgical History:  has a past surgical history that includes Tonsillectomy; Appendectomy; Muscle Biopsy; nasal biopsy; and coclonoscopy.  Family History: family history includes Alzheimer's disease in his mother; Cancer in his brother; Diabetes in his father and mother; Heart failure in his father and mother; Multiple sclerosis in his maternal uncle; Myocardial Infarction (Heart attack) in his father; Schizophrenia in his father.  Social History:  reports that he has never smoked. He has never used smokeless tobacco. He reports that he does not drink alcohol or use drugs.  Current Medications: has a current medication list which includes the following prescription(s): acetaminophen, aspirin, atorvastatin, cetirizine, fluticasone propionate, gabapentin, ibuprofen, insulin aspart protamine-aspart, omeprazole, and trazodone.  Allergies:      Allergies  Allergen Reactions  . Codeine Hallucination  . Shellfish Containing Products Hives  . Sulfa (Sulfonamide Antibiotics) Unknown    ROS:  A 15 point review of systems was performed and pertinent positives and negatives noted in HPI   Objective:   BP 145/86   Pulse 70   Ht 172.7 cm (5\' 8" )   Wt (!) 115.2 kg (254 lb)    BMI 38.62 kg/m   Constitutional :  alert, appears stated age, cooperative and no distress  Lymphatics/Throat:  no asymmetry, masses, or scars  Respiratory:  clear to auscultation bilaterally  Cardiovascular:  regular rate and rhythm  Gastrointestinal: soft, non-tender; bowel sounds normal; no masses,  no organomegaly.    Musculoskeletal: Steady gait and movement  Skin: Cool and moist.  Right anterior deltoid area with 4 x 4 centimeter smooths, soft, tender nodule consistent with lipoma.  Similar nodule within the right biceps region as well.  In the left flank 3 cm x 5 cm areas of smooth, soft, again tender area consistent with lipoma.  Approximately around the 11th rib.   Psychiatric: Normal affect, non-agitated, not confused       LABS:  n/a   RADS: n/a  Assessment:      Lipoma of right upper extremity [D17.21]  Lipoma of Left Flank  Plan:   1. Lipoma of right upper extremity [D17.21] Discussed surgical excision.  Alternatives include continued observation.  Benefits include possible symptom relief, pathologic evaluation, improved cosmesis. Discussed the risk of surgery including recurrence, chronic pain, post-op infxn, poor cosmesis, poor/delayed wound healing, and possible re-operation to address said risks. The risks of general anesthetic, if used, includes MI, CVA, sudden death or even reaction to anesthetic medications also discussed.  Typical post-op recovery time of 3-5 days with possible activity restrictions were also discussed.  The patient verbalized understanding and all questions were answered to the patient's satisfaction.  2. Patient has elected to proceed with surgical treatment. Procedure will be scheduled for removal of all three at once.  Written consent was obtained..     Electronically signed by Benjamine Sprague, DO  on 10/10/2018 10:57 AM

## 2018-10-15 ENCOUNTER — Other Ambulatory Visit: Payer: Self-pay

## 2018-10-15 ENCOUNTER — Encounter
Admission: RE | Admit: 2018-10-15 | Discharge: 2018-10-15 | Disposition: A | Discharge status: Home / Self Care | Payer: Medicare HMO | Source: Ambulatory Visit | Attending: Surgery | Admitting: Surgery

## 2018-10-15 DIAGNOSIS — Z01818 Encounter for other preprocedural examination: Secondary | ICD-10-CM | POA: Insufficient documentation

## 2018-10-15 HISTORY — DX: Cardiac arrhythmia, unspecified: I49.9

## 2018-10-15 HISTORY — DX: Acute myocardial infarction, unspecified: I21.9

## 2018-10-15 HISTORY — DX: Unspecified convulsions: R56.9

## 2018-10-15 HISTORY — DX: Other speech and language deficits following cerebral infarction: I69.328

## 2018-10-15 HISTORY — DX: Dyspnea, unspecified: R06.00

## 2018-10-15 HISTORY — DX: Dissociative and conversion disorder, unspecified: F44.9

## 2018-10-15 HISTORY — DX: Unspecified asthma, uncomplicated: J45.909

## 2018-10-15 HISTORY — DX: Personal history of irradiation: Z92.3

## 2018-10-15 HISTORY — DX: Personal history of urinary calculi: Z87.442

## 2018-10-15 HISTORY — DX: Adverse effect of unspecified anesthetic, initial encounter: T41.45XA

## 2018-10-15 HISTORY — DX: Gastro-esophageal reflux disease without esophagitis: K21.9

## 2018-10-15 HISTORY — DX: Cerebral infarction, unspecified: I63.9

## 2018-10-15 HISTORY — DX: Tremor, unspecified: R25.1

## 2018-10-15 HISTORY — DX: Other complications of anesthesia, initial encounter: T88.59XA

## 2018-10-15 NOTE — Patient Instructions (Signed)
INSTRUCTIONS FOR SURGERY     Your surgery is scheduled for: Thursday, October 17, 2018       To find out your arrival time for the day of surgery,          please call [phone] between 1 pm and 3 pm on : Wednesday, October 16, 2018     When you arrive for surgery, report to the [facility].       Do NOT stop on the first floor to register.    REMEMBER: Instructions that are not followed completely may result in serious medical risk,  up to and including death, or upon the discretion of your surgeon and anesthesiologist,            your surgery may need to be rescheduled.  __X__ 1. Do not eat food after midnight the night before your procedure.                   No gum, candy, lozenger, tic tacs, tums or hard candies.                   ABSOLUTELY NOTHING SOLID IN YOUR MOUTH AFTER MIDNIGHT                   You may drink unlimited clear liquids up to 2 hours before you are scheduled to arrive for surgery.                   Do not drink anything within those 2 hours unless you need to take medicine, then take the                   smallest amount you need.  Clear liquids include:  water, apple juice without pulp,                   any flavor Gatorade, Black coffee, black tea.  Sugar may be added but no dairy/ honey /lemon.                        Broth and jello is not considered a clear liquid.  __x__  2. On the morning of surgery, please brush your teeth with toothpaste and water. You may rinse with                  mouthwash if you wish but DO NOT SWALLOW TOOTHPASTE OR MOUTHWASH  __X___3. NO alcohol for 24 hours before or after surgery.  __x___ 4.  Do NOT smoke or use e-cigarettes for 24 HOURS PRIOR TO SURGERY.                      DO NOT Use any chewable tobacco products for at least 6 hours prior to surgery.  __x___ 5. If you start any new medication after this appointment and prior to surgery, please             Bring it with you on the day of surgery.  ___x__ 6. Notify your doctor if there is any change in your medical condition, such as fever, infection,  vomitting, diarrhea or any open sores.  __x___ 7.  USE the CHG SOAP as instructed, the night before surgery and the day of surgery.                   Once you have washed with this soap, do NOT use any of the following: Powders, perfumes                    or lotions. Please do not wear make up, hairpins, clips or nail polish. You MAY NOT  wear deodorant.                   Men may shave their face and neck.  Women need to shave 48 hours prior to surgery.                   DO NOT wear ANY jewelry on the day of surgery. If there are rings that are too tight to                    remove easily, please address this prior to the surgery day. Piercings need to be removed.                                                                     NO METAL ON YOUR BODY.                    Do NOT bring any valuables.  If you came to Pre-Admit testing then you will not need license,                     insurance card or credit card.  If you will be staying overnight, please either leave your things in                     the car or have your family be responsible for these items.                     [facility] IS NOT RESPONSIBLE FOR BELONGINGS OR VALUABLES.  ___X__ 8. DO NOT wear contact lenses on surgery day.  You may not have dentures,                     Hearing aides, contacts or glasses in the operating room. These items can be                    Placed in the Recovery Room to receive immediately after surgery.  __x___ 9. IF YOU ARE SCHEDULED TO GO HOME ON THE SAME DAY, YOU MUST                   Have someone to drive you home and to stay with you  for the first 24 hours.                    Have an arrangement prior to arriving on surgery day.  ___x__ 10. Take the following medications on the morning of surgery with a sip of  water:  1. OMEPRAZOLE / PRILOSEC                     2. GABAPENTIN                     3. FLONASE                     4. ALBUTEROL INHALER                     5.                     6.  _____ 11.  Follow any instructions provided to you by your surgeon.                        Such as enema, clear liquid bowel prep  __X__  12. STOP  ASPIRIN AS OF: October 09, 2018                       THIS INCLUDES BC POWDERS / GOODIES POWDER  __x___ 13. STOP Anti-inflammatories as of: TODAY                      This includes IBUPROFEN / MOTRIN / ADVIL / ALEVE/ NAPROXYN                    YOU MAY TAKE TYLENOL ANY TIME PRIOR TO SURGERY.  _____ 14.  Stop supplements until after surgery.                     This includes:                 You may continue taking Vitamin B12 / Vitamin D3 but do not take on the morning of surgery.  _____ 15. Bring your CPAP machine into preop with you on the morning of surgery.  ___X___16.  Stop Metformin 2 full days prior to surgery.  Stop on: N/A                 TAKE NOVOLOG AS USUAL THE DAY BEFORE SURGERY BUT NONE ON SURGERY                     Do NOT take any diabetes medications on surgery day.  ___X___17.  Continue to take the following medications but do not take on the morning of surgery:                           NOVOLOG ______18. If staying overnight, please have appropriate shoes to wear to be able to walk around the unit.                   Wear clean and comfortable clothing to the hospital. [place].

## 2018-10-17 ENCOUNTER — Encounter: Admission: RE | Payer: Self-pay | Source: Ambulatory Visit

## 2018-10-17 ENCOUNTER — Ambulatory Visit: Admission: RE | Admit: 2018-10-17 | Payer: Medicare HMO | Source: Ambulatory Visit | Admitting: Surgery

## 2018-10-17 SURGERY — EXCISION LIPOMA
Anesthesia: General | Laterality: Bilateral

## 2018-11-22 IMAGING — CT CT HEAD CODE STROKE
3 of 4 series · 15 of 47 positions shown, 18 images · non-contrast
Comparison: Head CT without contrast 02/24/2018 and earlier.

CLINICAL DATA: Code stroke. 57-year-old male. Severe headache, left
side weakness, abnormal speech.

EXAM:
CT HEAD WITHOUT CONTRAST
TECHNIQUE: Contiguous axial images were obtained from the base of the skull
through the vertex without intravenous contrast.

[Series 2: head wo · axial · 0.43mm/px · z∈[+269,+399]mm · 9 of 30 slices shown, 12 images]
[im 2/30  brain]
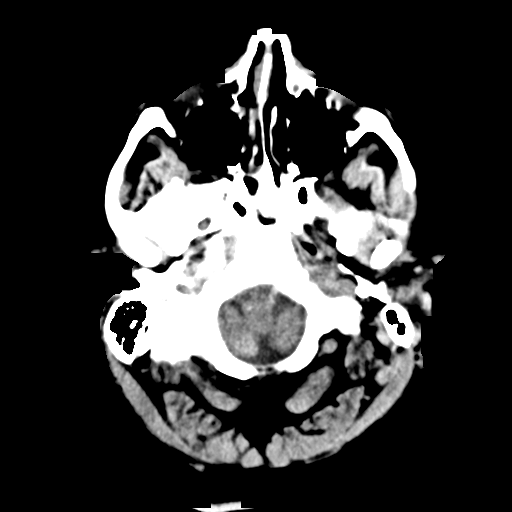
[im 2/30  bone]
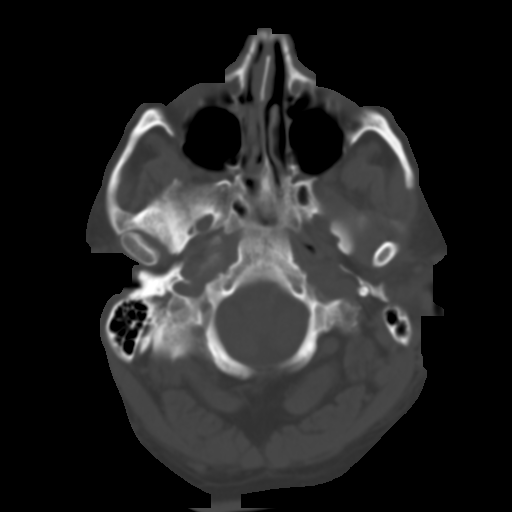
[im 6/30  brain]
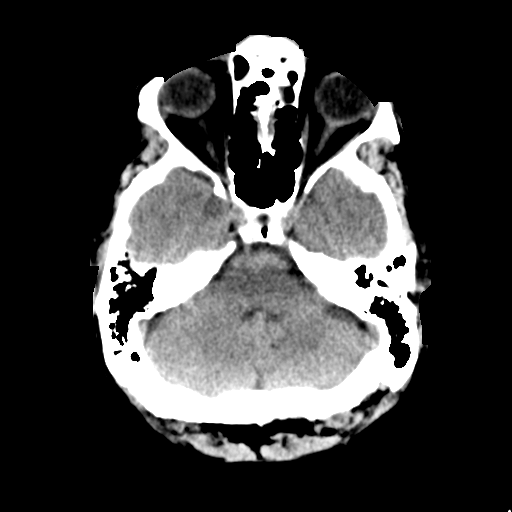
[im 8/30  brain]
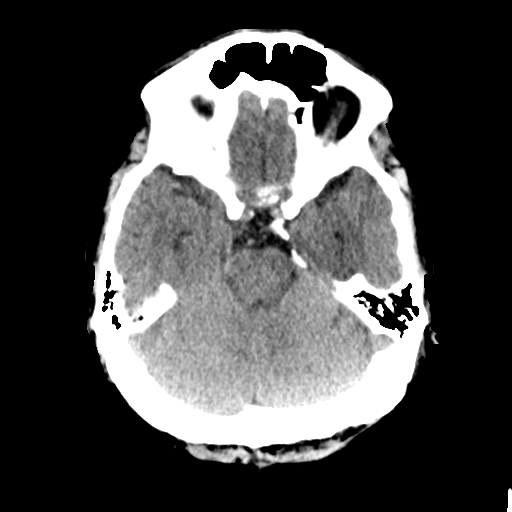
[im 12/30  brain]
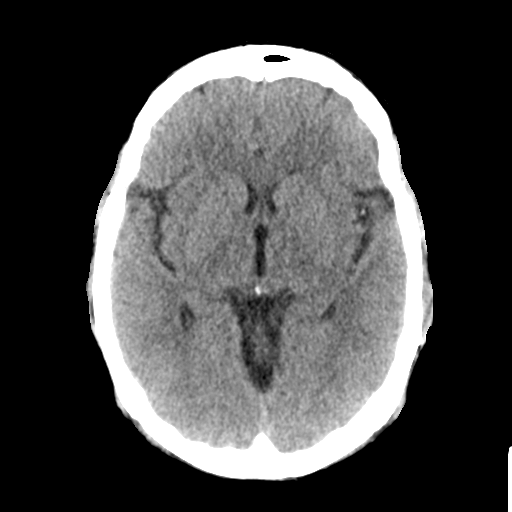
[im 16/30  brain]
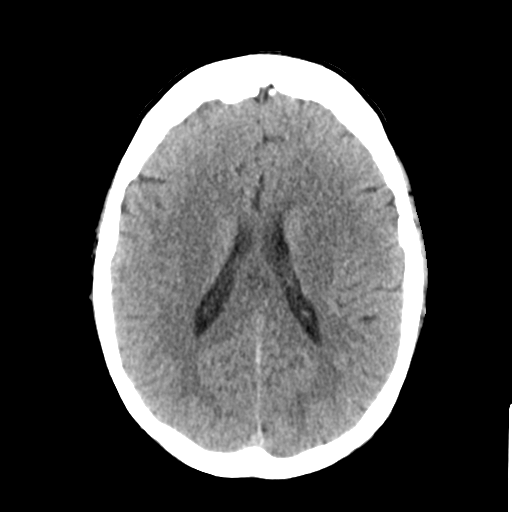
[im 16/30  bone]
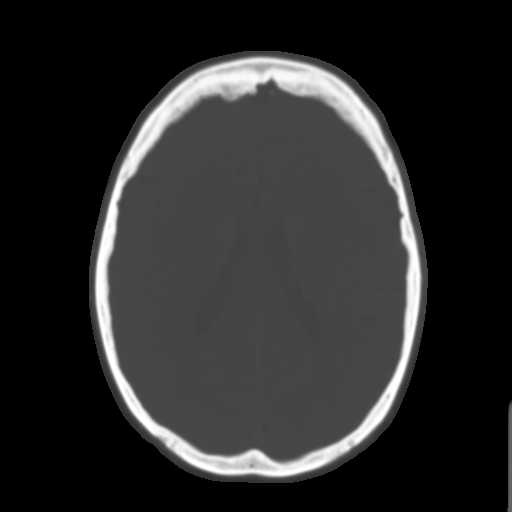
[im 18/30  brain]
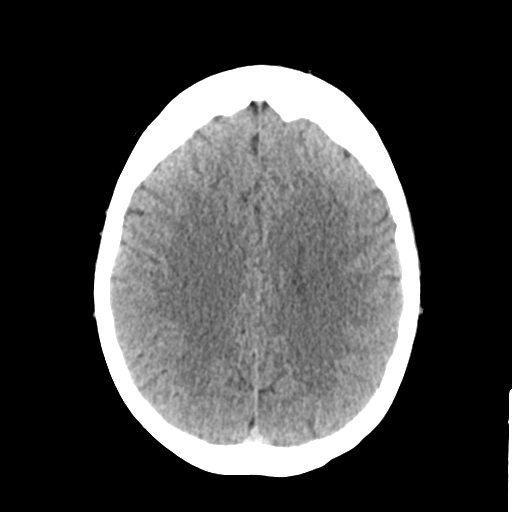
[im 22/30  brain]
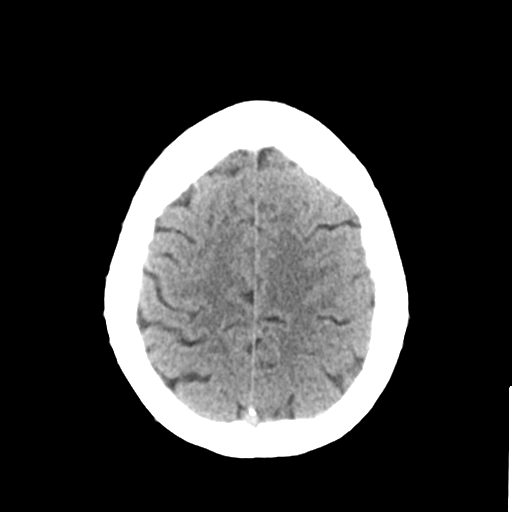
[im 24/30  brain]
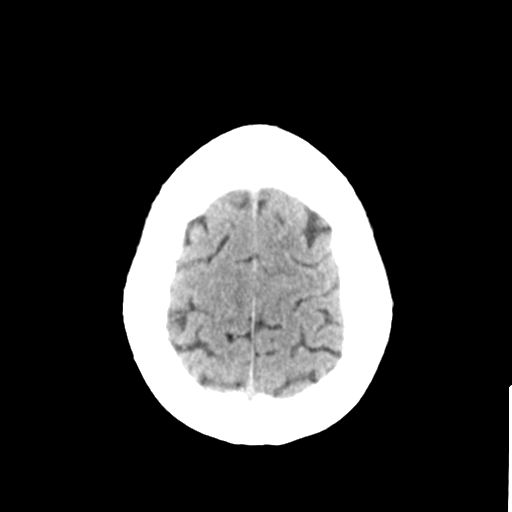
[im 28/30  brain]
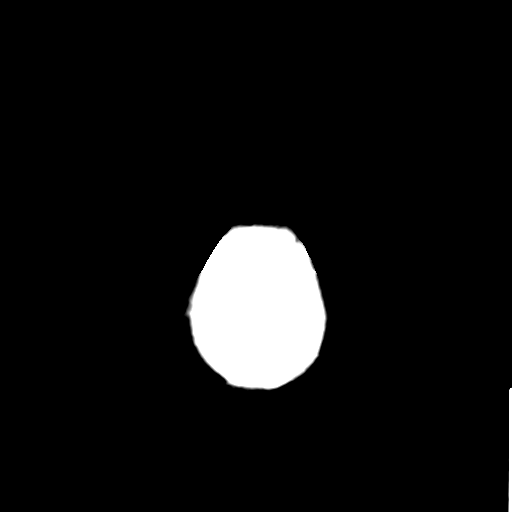
[im 28/30  bone]
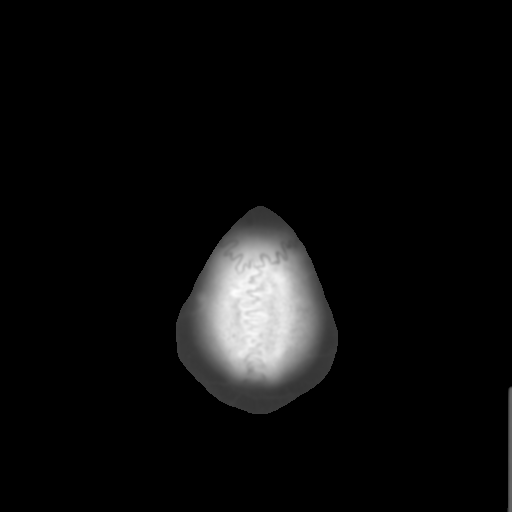

[Series 4: coronal soft tissue · coronal · 0.30mm/px · 3 of 63 slices shown]
[im 21/63  brain]
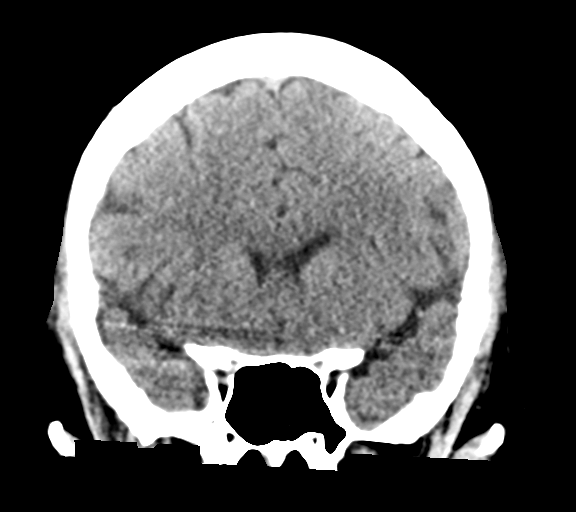
[im 28/63  brain]
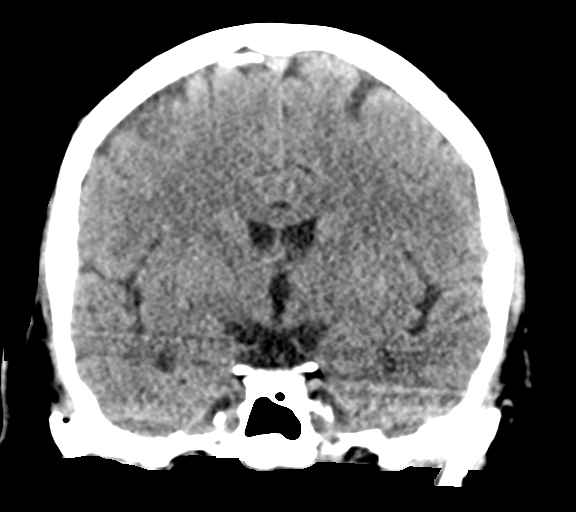
[im 35/63  brain]
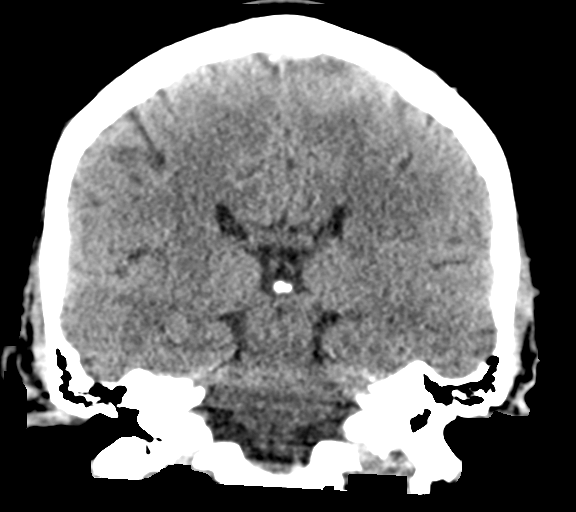

[Series 5: sagittal soft tissue · sagittal · 0.32mm/px · 3 of 52 slices shown]
[im 18/52  brain]
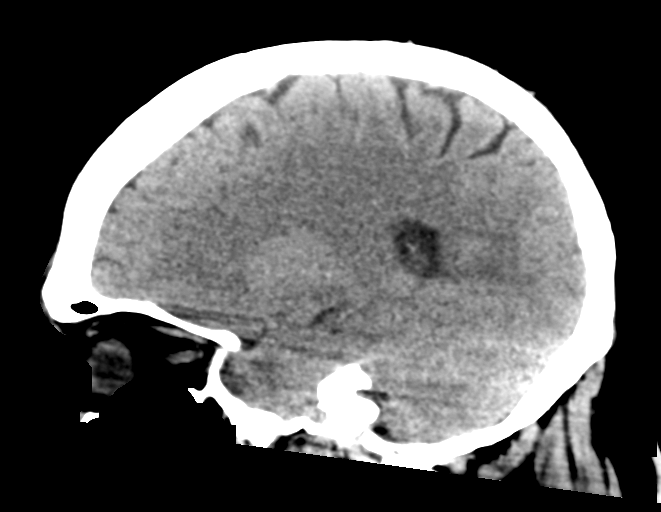
[im 26/52  brain]
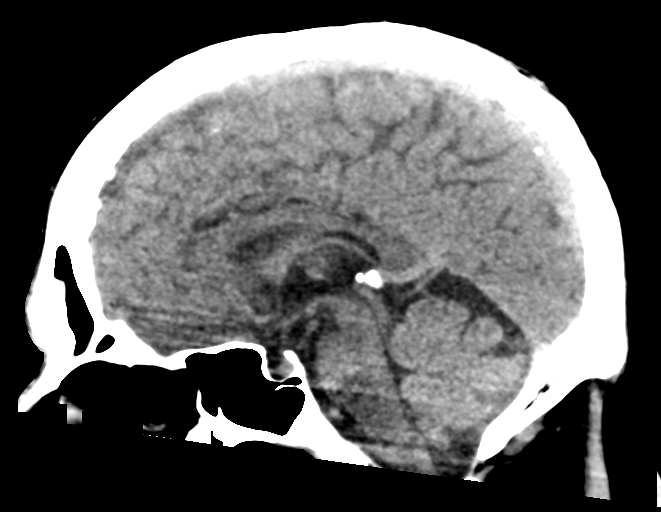
[im 35/52  brain]
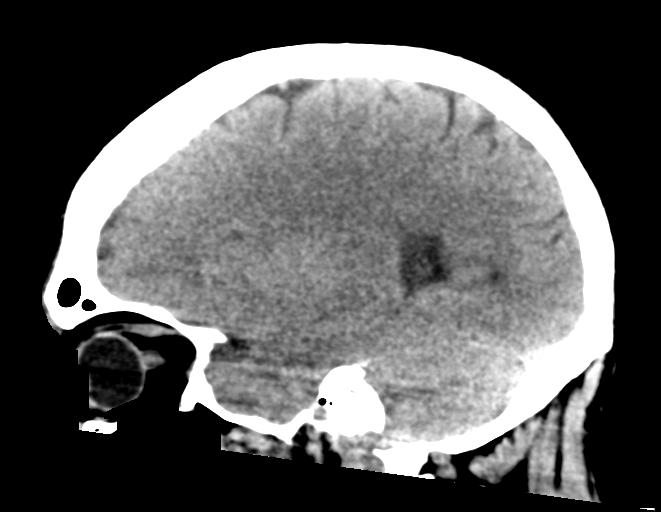

[15 of 47 positions shown; findings below may reference images not displayed]

FINDINGS: Brain: Cerebral volume remains normal. No midline shift,
ventriculomegaly, mass effect, evidence of mass lesion, intracranial
hemorrhage or evidence of cortically based acute infarction.

Gray-white matter differentiation is stable and within normal limits
throughout the brain.

Vascular: No suspicious intracranial vascular hyperdensity.

Skull: Negative.

Sinuses/Orbits: Visualized paranasal sinuses and mastoids are stable
and well pneumatized.

Other: Visualized orbits and scalp soft tissues are within normal
limits.

ASPECTS (Alberta Stroke Program Early CT Score)

- Ganglionic level infarction (caudate, lentiform nuclei, internal
capsule, insula, M1-M3 cortex): 7

- Supraganglionic infarction (M4-M6 cortex): 3

Total score (0-10 with 10 being normal): 10
IMPRESSION: 1. Stable and negative noncontrast CT appearance of the brain.
2. ASPECTS is 10.
3. CTA and CT Perfusion also just completed and will be reported
separately.

## 2019-01-29 ENCOUNTER — Other Ambulatory Visit: Payer: Self-pay

## 2019-01-29 DIAGNOSIS — N410 Acute prostatitis: Secondary | ICD-10-CM | POA: Diagnosis not present

## 2019-01-29 DIAGNOSIS — E785 Hyperlipidemia, unspecified: Secondary | ICD-10-CM | POA: Diagnosis present

## 2019-01-29 DIAGNOSIS — Z91013 Allergy to seafood: Secondary | ICD-10-CM

## 2019-01-29 DIAGNOSIS — Z7951 Long term (current) use of inhaled steroids: Secondary | ICD-10-CM | POA: Diagnosis not present

## 2019-01-29 DIAGNOSIS — K429 Umbilical hernia without obstruction or gangrene: Secondary | ICD-10-CM | POA: Diagnosis present

## 2019-01-29 DIAGNOSIS — I252 Old myocardial infarction: Secondary | ICD-10-CM

## 2019-01-29 DIAGNOSIS — R339 Retention of urine, unspecified: Secondary | ICD-10-CM | POA: Diagnosis not present

## 2019-01-29 DIAGNOSIS — I2510 Atherosclerotic heart disease of native coronary artery without angina pectoris: Secondary | ICD-10-CM | POA: Diagnosis not present

## 2019-01-29 DIAGNOSIS — Z8673 Personal history of transient ischemic attack (TIA), and cerebral infarction without residual deficits: Secondary | ICD-10-CM

## 2019-01-29 DIAGNOSIS — A4151 Sepsis due to Escherichia coli [E. coli]: Secondary | ICD-10-CM | POA: Diagnosis not present

## 2019-01-29 DIAGNOSIS — Z809 Family history of malignant neoplasm, unspecified: Secondary | ICD-10-CM | POA: Diagnosis not present

## 2019-01-29 DIAGNOSIS — I700 Atherosclerosis of aorta: Secondary | ICD-10-CM | POA: Diagnosis present

## 2019-01-29 DIAGNOSIS — Z20828 Contact with and (suspected) exposure to other viral communicable diseases: Secondary | ICD-10-CM | POA: Diagnosis not present

## 2019-01-29 DIAGNOSIS — M5126 Other intervertebral disc displacement, lumbar region: Secondary | ICD-10-CM | POA: Diagnosis not present

## 2019-01-29 DIAGNOSIS — E538 Deficiency of other specified B group vitamins: Secondary | ICD-10-CM | POA: Diagnosis present

## 2019-01-29 DIAGNOSIS — Z86718 Personal history of other venous thrombosis and embolism: Secondary | ICD-10-CM | POA: Diagnosis not present

## 2019-01-29 DIAGNOSIS — Z885 Allergy status to narcotic agent status: Secondary | ICD-10-CM | POA: Diagnosis not present

## 2019-01-29 DIAGNOSIS — Z820 Family history of epilepsy and other diseases of the nervous system: Secondary | ICD-10-CM | POA: Diagnosis not present

## 2019-01-29 DIAGNOSIS — R319 Hematuria, unspecified: Secondary | ICD-10-CM | POA: Diagnosis present

## 2019-01-29 DIAGNOSIS — N179 Acute kidney failure, unspecified: Secondary | ICD-10-CM | POA: Diagnosis not present

## 2019-01-29 DIAGNOSIS — Z85038 Personal history of other malignant neoplasm of large intestine: Secondary | ICD-10-CM

## 2019-01-29 DIAGNOSIS — E1140 Type 2 diabetes mellitus with diabetic neuropathy, unspecified: Secondary | ICD-10-CM | POA: Diagnosis not present

## 2019-01-29 DIAGNOSIS — Z7982 Long term (current) use of aspirin: Secondary | ICD-10-CM | POA: Diagnosis not present

## 2019-01-29 DIAGNOSIS — N390 Urinary tract infection, site not specified: Secondary | ICD-10-CM | POA: Diagnosis present

## 2019-01-29 DIAGNOSIS — K219 Gastro-esophageal reflux disease without esophagitis: Secondary | ICD-10-CM | POA: Diagnosis present

## 2019-01-29 DIAGNOSIS — G4730 Sleep apnea, unspecified: Secondary | ICD-10-CM | POA: Diagnosis not present

## 2019-01-29 DIAGNOSIS — Z882 Allergy status to sulfonamides status: Secondary | ICD-10-CM

## 2019-01-29 DIAGNOSIS — E860 Dehydration: Secondary | ICD-10-CM | POA: Diagnosis present

## 2019-01-29 DIAGNOSIS — Z794 Long term (current) use of insulin: Secondary | ICD-10-CM

## 2019-01-30 ENCOUNTER — Inpatient Hospital Stay
Admission: EM | Admit: 2019-01-30 | Discharge: 2019-02-01 | DRG: 872 | Disposition: A | Discharge status: Home / Self Care | Payer: Medicare Other | Attending: Internal Medicine | Admitting: Internal Medicine

## 2019-01-30 ENCOUNTER — Other Ambulatory Visit: Payer: Self-pay

## 2019-01-30 ENCOUNTER — Emergency Department: Payer: Medicare Other

## 2019-01-30 ENCOUNTER — Encounter: Payer: Self-pay | Admitting: Emergency Medicine

## 2019-01-30 DIAGNOSIS — N390 Urinary tract infection, site not specified: Secondary | ICD-10-CM | POA: Diagnosis present

## 2019-01-30 DIAGNOSIS — N179 Acute kidney failure, unspecified: Secondary | ICD-10-CM

## 2019-01-30 DIAGNOSIS — Z7982 Long term (current) use of aspirin: Secondary | ICD-10-CM | POA: Diagnosis not present

## 2019-01-30 DIAGNOSIS — M5126 Other intervertebral disc displacement, lumbar region: Secondary | ICD-10-CM | POA: Diagnosis not present

## 2019-01-30 DIAGNOSIS — Z885 Allergy status to narcotic agent status: Secondary | ICD-10-CM | POA: Diagnosis not present

## 2019-01-30 DIAGNOSIS — A4151 Sepsis due to Escherichia coli [E. coli]: Secondary | ICD-10-CM | POA: Diagnosis not present

## 2019-01-30 DIAGNOSIS — Z20828 Contact with and (suspected) exposure to other viral communicable diseases: Secondary | ICD-10-CM | POA: Diagnosis not present

## 2019-01-30 DIAGNOSIS — Z882 Allergy status to sulfonamides status: Secondary | ICD-10-CM | POA: Diagnosis not present

## 2019-01-30 DIAGNOSIS — I252 Old myocardial infarction: Secondary | ICD-10-CM | POA: Diagnosis not present

## 2019-01-30 DIAGNOSIS — Z7951 Long term (current) use of inhaled steroids: Secondary | ICD-10-CM | POA: Diagnosis not present

## 2019-01-30 DIAGNOSIS — I700 Atherosclerosis of aorta: Secondary | ICD-10-CM | POA: Diagnosis present

## 2019-01-30 DIAGNOSIS — Z85038 Personal history of other malignant neoplasm of large intestine: Secondary | ICD-10-CM | POA: Diagnosis not present

## 2019-01-30 DIAGNOSIS — A419 Sepsis, unspecified organism: Secondary | ICD-10-CM | POA: Diagnosis present

## 2019-01-30 DIAGNOSIS — E785 Hyperlipidemia, unspecified: Secondary | ICD-10-CM | POA: Diagnosis not present

## 2019-01-30 DIAGNOSIS — R000 Tachycardia, unspecified: Secondary | ICD-10-CM

## 2019-01-30 DIAGNOSIS — Z86718 Personal history of other venous thrombosis and embolism: Secondary | ICD-10-CM | POA: Diagnosis not present

## 2019-01-30 DIAGNOSIS — K429 Umbilical hernia without obstruction or gangrene: Secondary | ICD-10-CM | POA: Diagnosis not present

## 2019-01-30 DIAGNOSIS — R339 Retention of urine, unspecified: Secondary | ICD-10-CM | POA: Diagnosis not present

## 2019-01-30 DIAGNOSIS — I2510 Atherosclerotic heart disease of native coronary artery without angina pectoris: Secondary | ICD-10-CM | POA: Diagnosis not present

## 2019-01-30 DIAGNOSIS — K219 Gastro-esophageal reflux disease without esophagitis: Secondary | ICD-10-CM | POA: Diagnosis not present

## 2019-01-30 DIAGNOSIS — Z8673 Personal history of transient ischemic attack (TIA), and cerebral infarction without residual deficits: Secondary | ICD-10-CM | POA: Diagnosis not present

## 2019-01-30 DIAGNOSIS — N410 Acute prostatitis: Secondary | ICD-10-CM | POA: Diagnosis present

## 2019-01-30 DIAGNOSIS — E1140 Type 2 diabetes mellitus with diabetic neuropathy, unspecified: Secondary | ICD-10-CM | POA: Diagnosis not present

## 2019-01-30 DIAGNOSIS — R338 Other retention of urine: Secondary | ICD-10-CM

## 2019-01-30 DIAGNOSIS — Z809 Family history of malignant neoplasm, unspecified: Secondary | ICD-10-CM | POA: Diagnosis not present

## 2019-01-30 DIAGNOSIS — Z820 Family history of epilepsy and other diseases of the nervous system: Secondary | ICD-10-CM | POA: Diagnosis not present

## 2019-01-30 DIAGNOSIS — G4730 Sleep apnea, unspecified: Secondary | ICD-10-CM | POA: Diagnosis present

## 2019-01-30 LAB — BASIC METABOLIC PANEL
Anion gap: 8 (ref 5–15)
BUN: 20 mg/dL (ref 6–20)
CO2: 22 mmol/L (ref 22–32)
Calcium: 7.7 mg/dL — ABNORMAL LOW (ref 8.9–10.3)
Chloride: 105 mmol/L (ref 98–111)
Creatinine, Ser: 1.23 mg/dL (ref 0.61–1.24)
GFR calc Af Amer: >60 mL/min (ref >60)
GFR calc non Af Amer: >60 mL/min (ref >60)
Glucose, Bld: 264 mg/dL — ABNORMAL HIGH (ref 70–99)
Potassium: 4 mmol/L (ref 3.5–5.1)
Sodium: 135 mmol/L (ref 135–145)

## 2019-01-30 LAB — COMPREHENSIVE METABOLIC PANEL
ALT: 20 U/L (ref 0–44)
AST: 17 U/L (ref 15–41)
Albumin: 3.5 g/dL (ref 3.5–5.0)
Alkaline Phosphatase: 52 U/L (ref 38–126)
Anion gap: 10 (ref 5–15)
BUN: 20 mg/dL (ref 6–20)
CO2: 23 mmol/L (ref 22–32)
Calcium: 8.4 mg/dL — ABNORMAL LOW (ref 8.9–10.3)
Chloride: 101 mmol/L (ref 98–111)
Creatinine, Ser: 1.48 mg/dL — ABNORMAL HIGH (ref 0.61–1.24)
GFR calc Af Amer: 59 mL/min — ABNORMAL LOW (ref >60)
GFR calc non Af Amer: 51 mL/min — ABNORMAL LOW (ref >60)
Glucose, Bld: 244 mg/dL — ABNORMAL HIGH (ref 70–99)
Potassium: 4.5 mmol/L (ref 3.5–5.1)
Sodium: 134 mmol/L — ABNORMAL LOW (ref 135–145)
Total Bilirubin: 2.6 mg/dL — ABNORMAL HIGH (ref 0.3–1.2)
Total Protein: 6.8 g/dL (ref 6.5–8.1)

## 2019-01-30 LAB — PROCALCITONIN: Procalcitonin: 23.13 ng/mL

## 2019-01-30 LAB — CBC
HCT: 42.2 % (ref 39.0–52.0)
HCT: 44 % (ref 39.0–52.0)
Hemoglobin: 14.1 g/dL (ref 13.0–17.0)
Hemoglobin: 15.2 g/dL (ref 13.0–17.0)
MCH: 31.3 pg (ref 26.0–34.0)
MCH: 31.4 pg (ref 26.0–34.0)
MCHC: 33.4 g/dL (ref 30.0–36.0)
MCHC: 34.5 g/dL (ref 30.0–36.0)
MCV: 90.5 fL (ref 80.0–100.0)
MCV: 94 fL (ref 80.0–100.0)
Platelets: 148 10*3/uL — ABNORMAL LOW (ref 150–400)
Platelets: 187 10*3/uL (ref 150–400)
RBC: 4.49 MIL/uL (ref 4.22–5.81)
RBC: 4.86 MIL/uL (ref 4.22–5.81)
RDW: 12.7 % (ref 11.5–15.5)
RDW: 12.9 % (ref 11.5–15.5)
WBC: 14.9 10*3/uL — ABNORMAL HIGH (ref 4.0–10.5)
WBC: 19.1 10*3/uL — ABNORMAL HIGH (ref 4.0–10.5)
nRBC: 0 % (ref 0.0–0.2)
nRBC: 0 % (ref 0.0–0.2)

## 2019-01-30 LAB — URINALYSIS, COMPLETE (UACMP) WITH MICROSCOPIC
Bacteria, UA: NONE SEEN
Bilirubin Urine: NEGATIVE
Glucose, UA: 150 mg/dL — AB
Ketones, ur: 20 mg/dL — AB
Nitrite: NEGATIVE
Protein, ur: 100 mg/dL — AB
RBC / HPF: >50 RBC/hpf — ABNORMAL HIGH (ref 0–5)
Specific Gravity, Urine: 1.027 (ref 1.005–1.030)
WBC, UA: >50 WBC/hpf — ABNORMAL HIGH (ref 0–5)
pH: 5 (ref 5.0–8.0)

## 2019-01-30 LAB — LACTIC ACID, PLASMA
Lactic Acid, Venous: 1.8 mmol/L (ref 0.5–1.9)
Lactic Acid, Venous: 1.8 mmol/L (ref 0.5–1.9)

## 2019-01-30 LAB — GLUCOSE, CAPILLARY
Glucose-Capillary: 285 mg/dL — ABNORMAL HIGH (ref 70–99)
Glucose-Capillary: 297 mg/dL — ABNORMAL HIGH (ref 70–99)
Glucose-Capillary: 229 mg/dL — ABNORMAL HIGH (ref 70–99)

## 2019-01-30 LAB — LIPASE, BLOOD: Lipase: 28 U/L (ref 11–51)

## 2019-01-30 LAB — PROTIME-INR
INR: 1.1 (ref 0.8–1.2)
Prothrombin Time: 14.1 seconds (ref 11.4–15.2)

## 2019-01-30 LAB — HEMOGLOBIN A1C
Hgb A1c MFr Bld: 8.4 % — ABNORMAL HIGH (ref 4.8–5.6)
Mean Plasma Glucose: 194.38 mg/dL

## 2019-01-30 MED ORDER — CIPROFLOXACIN IN D5W 400 MG/200ML IV SOLN
400.0000 mg | Freq: Two times a day (BID) | INTRAVENOUS | Status: DC
  Administered 2019-01-31 – 2019-02-01 (×3): 400 mg via INTRAVENOUS
  Filled 2019-01-30 (×4): qty 200

## 2019-01-30 MED ORDER — METOPROLOL TARTRATE 25 MG PO TABS
25.0000 mg | ORAL_TABLET | Freq: Two times a day (BID) | ORAL | Status: DC
  Administered 2019-01-30 – 2019-02-01 (×5): 25 mg via ORAL
  Filled 2019-01-30 (×5): qty 1

## 2019-01-30 MED ORDER — ENOXAPARIN SODIUM 40 MG/0.4ML ~~LOC~~ SOLN
40.0000 mg | SUBCUTANEOUS | Status: DC
  Administered 2019-01-30 – 2019-02-01 (×3): 40 mg via SUBCUTANEOUS
  Filled 2019-01-30 (×3): qty 0.4

## 2019-01-30 MED ORDER — SODIUM CHLORIDE 0.9 % IV SOLN
INTRAVENOUS | Status: DC
  Administered 2019-01-30 – 2019-01-31 (×3): via INTRAVENOUS

## 2019-01-30 MED ORDER — SODIUM CHLORIDE 0.9 % IV BOLUS
1000.0000 mL | Freq: Once | INTRAVENOUS | Status: AC
  Administered 2019-01-30: 03:00:00 1000 mL via INTRAVENOUS

## 2019-01-30 MED ORDER — TRAZODONE HCL 50 MG PO TABS: 25.0000 mg | ORAL_TABLET | Freq: Every evening | ORAL | Status: DC | PRN

## 2019-01-30 MED ORDER — LEVOFLOXACIN IN D5W 500 MG/100ML IV SOLN
500.0000 mg | Freq: Once | INTRAVENOUS | Status: AC
  Administered 2019-01-30: 05:00:00 500 mg via INTRAVENOUS
  Filled 2019-01-30: qty 100

## 2019-01-30 MED ORDER — PNEUMOCOCCAL VAC POLYVALENT 25 MCG/0.5ML IJ INJ
0.5000 mL | INJECTION | INTRAMUSCULAR | Status: DC
  Filled 2019-01-30: qty 0.5

## 2019-01-30 MED ORDER — MAGNESIUM HYDROXIDE 400 MG/5ML PO SUSP
30.0000 mL | Freq: Every day | ORAL | Status: DC | PRN
  Filled 2019-01-30: qty 30

## 2019-01-30 MED ORDER — GABAPENTIN 100 MG PO CAPS
100.0000 mg | ORAL_CAPSULE | Freq: Three times a day (TID) | ORAL | Status: DC
  Administered 2019-01-30 – 2019-02-01 (×7): 100 mg via ORAL
  Filled 2019-01-30 (×7): qty 1

## 2019-01-30 MED ORDER — ASPIRIN EC 81 MG PO TBEC
81.0000 mg | DELAYED_RELEASE_TABLET | ORAL | Status: DC
  Administered 2019-01-30 – 2019-02-01 (×3): 81 mg via ORAL
  Filled 2019-01-30 (×3): qty 1

## 2019-01-30 MED ORDER — OXYBUTYNIN CHLORIDE 5 MG PO TABS
5.0000 mg | ORAL_TABLET | ORAL | Status: AC
  Administered 2019-01-30: 03:00:00 5 mg via ORAL
  Filled 2019-01-30: qty 1

## 2019-01-30 MED ORDER — BISMUTH SUBSALICYLATE 262 MG PO CHEW
524.0000 mg | CHEWABLE_TABLET | ORAL | Status: DC | PRN
  Filled 2019-01-30: qty 2

## 2019-01-30 MED ORDER — LORATADINE 10 MG PO TABS
10.0000 mg | ORAL_TABLET | Freq: Every day | ORAL | Status: DC
  Administered 2019-02-01: 08:00:00 10 mg via ORAL
  Filled 2019-01-30 (×2): qty 1

## 2019-01-30 MED ORDER — ONDANSETRON HCL 4 MG/2ML IJ SOLN: 4.0000 mg | Freq: Four times a day (QID) | INTRAMUSCULAR | Status: DC | PRN

## 2019-01-30 MED ORDER — FLUTICASONE PROPIONATE 50 MCG/ACT NA SUSP
1.0000 | Freq: Every day | NASAL | Status: DC | PRN
  Filled 2019-01-30: qty 16

## 2019-01-30 MED ORDER — SODIUM CHLORIDE 0.9 % IV BOLUS (SEPSIS)
1250.0000 mL | Freq: Once | INTRAVENOUS | Status: AC
  Administered 2019-01-30: 1250 mL via INTRAVENOUS

## 2019-01-30 MED ORDER — SODIUM CHLORIDE 0.9% FLUSH: 3.0000 mL | Freq: Once | INTRAVENOUS | Status: DC

## 2019-01-30 MED ORDER — ACETAMINOPHEN 650 MG RE SUPP: 650.0000 mg | Freq: Four times a day (QID) | RECTAL | Status: DC | PRN

## 2019-01-30 MED ORDER — ACETAMINOPHEN 325 MG PO TABS
650.0000 mg | ORAL_TABLET | Freq: Four times a day (QID) | ORAL | Status: DC | PRN
  Administered 2019-01-30 – 2019-01-31 (×5): 650 mg via ORAL
  Filled 2019-01-30 (×5): qty 2

## 2019-01-30 MED ORDER — INSULIN GLARGINE 100 UNIT/ML ~~LOC~~ SOLN
36.0000 [IU] | Freq: Every day | SUBCUTANEOUS | Status: DC
  Administered 2019-01-30 – 2019-01-31 (×2): 36 [IU] via SUBCUTANEOUS
  Filled 2019-01-30 (×3): qty 0.36

## 2019-01-30 MED ORDER — DIPHENHYDRAMINE HCL 25 MG PO CAPS: 25.0000 mg | ORAL_CAPSULE | Freq: Four times a day (QID) | ORAL | Status: DC | PRN

## 2019-01-30 MED ORDER — ONDANSETRON HCL 4 MG/2ML IJ SOLN
4.0000 mg | INTRAMUSCULAR | Status: AC
  Administered 2019-01-30: 04:00:00 4 mg via INTRAVENOUS
  Filled 2019-01-30: qty 2

## 2019-01-30 MED ORDER — ALBUTEROL SULFATE (2.5 MG/3ML) 0.083% IN NEBU: 2.5000 mg | INHALATION_SOLUTION | RESPIRATORY_TRACT | Status: DC | PRN

## 2019-01-30 MED ORDER — INSULIN ASPART 100 UNIT/ML ~~LOC~~ SOLN
10.0000 [IU] | Freq: Three times a day (TID) | SUBCUTANEOUS | Status: DC
  Administered 2019-01-30 (×3): 10 [IU] via SUBCUTANEOUS
  Filled 2019-01-30 (×4): qty 1

## 2019-01-30 MED ORDER — MORPHINE SULFATE (PF) 4 MG/ML IV SOLN
4.0000 mg | Freq: Once | INTRAVENOUS | Status: AC
  Administered 2019-01-30: 04:00:00 4 mg via INTRAVENOUS
  Filled 2019-01-30: qty 1

## 2019-01-30 MED ORDER — INSULIN ASPART 100 UNIT/ML ~~LOC~~ SOLN
0.0000 [IU] | Freq: Every day | SUBCUTANEOUS | Status: DC
  Administered 2019-01-30 – 2019-01-31 (×2): 2 [IU] via SUBCUTANEOUS
  Filled 2019-01-30 (×2): qty 1

## 2019-01-30 MED ORDER — ATORVASTATIN CALCIUM 20 MG PO TABS
10.0000 mg | ORAL_TABLET | Freq: Every day | ORAL | Status: DC
  Administered 2019-01-30 – 2019-01-31 (×2): 10 mg via ORAL
  Filled 2019-01-30 (×2): qty 1

## 2019-01-30 MED ORDER — INSULIN ASPART PROT & ASPART (70-30 MIX) 100 UNIT/ML ~~LOC~~ SUSP: 43.0000 [IU] | SUBCUTANEOUS | Status: DC

## 2019-01-30 MED ORDER — MECLIZINE HCL 25 MG PO TABS
25.0000 mg | ORAL_TABLET | Freq: Three times a day (TID) | ORAL | Status: DC | PRN
  Filled 2019-01-30: qty 1

## 2019-01-30 MED ORDER — ONDANSETRON HCL 4 MG PO TABS: 4.0000 mg | ORAL_TABLET | Freq: Four times a day (QID) | ORAL | Status: DC | PRN

## 2019-01-30 MED ORDER — SODIUM CHLORIDE 0.9 % IV SOLN
1.0000 g | INTRAVENOUS | Status: DC
  Administered 2019-01-30: 03:00:00 1 g via INTRAVENOUS
  Filled 2019-01-30: qty 10

## 2019-01-30 MED ORDER — INSULIN ASPART 100 UNIT/ML ~~LOC~~ SOLN
0.0000 [IU] | Freq: Three times a day (TID) | SUBCUTANEOUS | Status: DC
  Administered 2019-01-31 (×3): 2 [IU] via SUBCUTANEOUS
  Filled 2019-01-30 (×4): qty 1

## 2019-01-30 MED ORDER — PANTOPRAZOLE SODIUM 40 MG PO TBEC
40.0000 mg | DELAYED_RELEASE_TABLET | Freq: Every day | ORAL | Status: DC
  Administered 2019-01-30 – 2019-02-01 (×3): 40 mg via ORAL
  Filled 2019-01-30 (×3): qty 1

## 2019-01-30 NOTE — H&P (Signed)
[facility] NAME: Michael Gill    MR#:  [id number]  DATE OF BIRTH:  [birth year]  DATE OF ADMISSION:  01/30/2019  PRIMARY CARE PHYSICIAN: Letta Median, MD   REQUESTING/REFERRING PHYSICIAN: Hinda Kehr, MD CHIEF COMPLAINT:   Chief Complaint  Patient presents with  . Abdominal Pain  . Urinary Retention    HISTORY OF PRESENT ILLNESS:  Michael Gill  is a 58 y.o. male with a known history of multiple medical problems that are mentioned below including asthma, [name], vitamin B12 deficiency and CVA, who presented to the emergency room acute onset of difficulty with urination with significant dysuria and what he describes as excruciating pain that started on Wednesday at 3 AM.  He had associated lower abdominal pain.  He denied any fever or chills.  No nausea or vomiting.  No cough or wheezing or shortness of breath.  No chest pain or palpitations.  No recent sick exposures.  Upon presentation to the emergency room, heart rate was 121 with otherwise normal vital signs and temperature was 98.7.  Labs revealed leukocytosis of 14.9 and BUN was 20 with creatinine of 1.23.  Lactic acid was 1.8 and repeat level was similar.  UA was strongly positive for UTI.  Novel coronavirus sent out lab is pending.  Abdominal pelvic CT scan revealed enlarged hypodense prostate with surrounding edema as well as thickening of the adjacent anterior rectum with findings likely representing acute prostatitis.  There was aortic atherosclerosis and small periumbilical hernia containing fat as well as calcified L4-5 disc bulge with mild spinal canal stenosis.  The patient was given IV Levaquin, 4 mg IV morphine sulfate and 4 mg of IV Zofran as well as 5 mg of p.o. Ditropan with IV normal saline boluses.  He will be admitted to the medical monitored bed for further evaluation and management. PAST MEDICAL HISTORY:   Past Medical History:  Diagnosis Date   . Asthma   . B12 deficiency    in the past  . CAD (coronary artery disease)    s/p MI  . Colon cancer ([place]) 1990  . Complication of anesthesia    long time to wake up from colonoscopy.  . Conversion disorder    difficulty with visual disturbances and changes with unknown etiology, along with headaches  . CVA (cerebral infarction)   . 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 DVT (deep venous thrombosis) ([place]) 2016   behind right knee  . Dyspnea 09/2018   with exercise  . Dysrhythmia    has had Afib in past.  . GERD (gastroesophageal reflux disease)   . History of kidney stones 1990  . Hx of therapeutic radiation   . Hyperlipidemia   . Myocardial infarction ([place]) 1990  . Neuropathic pain, leg   . 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  . Seizure ([place])    last episode 08/2017. has no warning, will just happen. nothing specific precipitates event  . Sleep apnea    does not use cpap  . 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 Stroke [facility]) 2012, 03/2018   residual tremor left hand, facial droop  . Tremors of nervous system    specifically left hand, since stroke    PAST SURGICAL HISTORY:   Past Surgical History:  Procedure Laterality Date  . APPENDECTOMY  1971  . CARDIAC CATHETERIZATION Left 10/12/2015   Procedure: Left Heart Cath and Coronary Angiography;  Surgeon: Yolonda Kida, MD;  Location: [facility];  Service: Cardiovascular;  Laterality: Left;  . TONSILLECTOMY      SOCIAL HISTORY:   Social History   Tobacco Use  . Smoking status: Never Smoker  . Smokeless tobacco: Never Used  Substance Use Topics  . Alcohol use: No    FAMILY HISTORY:   Family History  Problem Relation Age of Onset  . Alzheimer's disease Mother   . Cancer Brother     DRUG ALLERGIES:   Allergies  Allergen Reactions  . Shellfish Allergy  Hives    Swelling of throat and mouth. Betadine is NOT a problem  . Codeine Other (See Comments)    Reaction:  Hallucinations   . Sulfa Antibiotics Hives    REVIEW OF SYSTEMS:   ROS As per history of present illness. All pertinent systems were reviewed above. Constitutional,  HEENT, cardiovascular, respiratory, GI, GU, musculoskeletal, neuro, psychiatric, endocrine,  integumentary and hematologic systems were reviewed and are otherwise  negative/unremarkable except for positive findings mentioned above in the HPI.   MEDICATIONS AT HOME:   Prior to Admission medications   Medication Sig Start Date End Date Taking? Authorizing Provider  acetaminophen (TYLENOL) 500 MG tablet Take 500 mg by mouth every 6 (six) hours as needed for moderate pain.   Yes [provider]  albuterol (PROVENTIL HFA;VENTOLIN HFA) 108 (90 Base) MCG/ACT inhaler Inhale 1-2 puffs into the lungs every 4 (four) hours as needed for wheezing or shortness of breath.   Yes [provider]  aspirin EC 81 MG tablet Take 81 mg by mouth every morning.    Yes [provider]  atorvastatin (LIPITOR) 10 MG tablet Take 10 mg by mouth daily.   Yes [provider]  bismuth subsalicylate (PEPTO BISMOL) 262 MG chewable tablet Chew 524 mg by mouth as needed.   Yes [provider]  cetirizine (ZYRTEC) 10 MG tablet Take 10 mg by mouth daily.   Yes [provider]  fluticasone (FLONASE) 50 MCG/ACT nasal spray Place 1 spray into both nostrils daily as needed for allergies or rhinitis.   Yes [provider]  gabapentin (NEURONTIN) 100 MG capsule Take 100 mg by mouth 3 (three) times daily.    Yes [provider]  ibuprofen (ADVIL,MOTRIN) 600 MG tablet Take 600 mg by mouth every 8 (eight) hours as needed (PAIN).   Yes [provider]  insulin aspart (NOVOLOG) 100 UNIT/ML injection Inject 10 Units into the skin 3 (three) times daily with meals.   Yes [provider]  insulin glargine (LANTUS) 100 UNIT/ML injection Inject 36 Units into the skin daily.   Yes [provider]  meclizine (ANTIVERT) 25 MG tablet Take 25 mg by mouth 3 (three) times daily as needed for dizziness.   Yes [provider]  omeprazole (PRILOSEC) 40 MG capsule Take 40 mg by mouth daily.   Yes [provider]      VITAL SIGNS:  Blood pressure 129/79, pulse (!) 107, temperature 98.7 F (37.1 C), temperature source Oral, resp. rate 18, height 5\' 8"  (1.727 m), weight 117.9 kg, SpO2 98 %.  PHYSICAL EXAMINATION:  Physical Exam  GENERAL:  58 y.o.-year-old patient lying in the bed with no acute distress.  EYES: Pupils equal, round, reactive to light and accommodation. No scleral icterus. Extraocular muscles intact.  HEENT: Head atraumatic, normocephalic. Oropharynx and nasopharynx clear.  NECK:  Supple, no jugular venous distention. No thyroid enlargement, no tenderness.  LUNGS: Normal breath sounds bilaterally, no wheezing, rales,rhonchi or crepitation. No use of accessory muscles of respiration.  CARDIOVASCULAR: Regular rate and rhythm, S1, S2 normal. No murmurs, rubs, or gallops.  ABDOMEN: Soft, nondistended, nontender. Bowel sounds present. No organomegaly or mass.  EXTREMITIES: No pedal edema, cyanosis, or clubbing.  NEUROLOGIC: Cranial nerves II through XII are intact. Muscle strength 5/5 in all extremities. Sensation intact. Gait not checked.  PSYCHIATRIC: The patient is alert and oriented x 3.  Normal affect and good eye contact. SKIN: No obvious rash, lesion, or ulcer.   LABORATORY PANEL:   CBC Recent Labs  Lab 01/30/19 0555  WBC 14.9*  HGB 14.1  HCT 42.2  PLT 148*   ------------------------------------------------------------------------------------------------------------------  Chemistries  Recent Labs  Lab 01/30/19 0012 01/30/19 0555  NA 134* 135  K 4.5 4.0  CL 101 105  CO2 23 22  GLUCOSE 244* 264*  BUN 20 20   CREATININE 1.48* 1.23  CALCIUM 8.4* 7.7*  AST 17  --   ALT 20  --   ALKPHOS 52  --   BILITOT 2.6*  --    ------------------------------------------------------------------------------------------------------------------  Cardiac Enzymes No results for input(s): TROPONINI in the last 168 hours. ------------------------------------------------------------------------------------------------------------------  RADIOLOGY:  Ct Renal Stone Study  Result Date: 01/30/2019 CLINICAL DATA:  58 y/o M; Flank pain, stone disease suspected acute urinary retention and acute kidney injury, hematuria, probable UTI vs pyelonephritis, prostatitis also very likely. EXAM: CT ABDOMEN AND PELVIS WITHOUT CONTRAST TECHNIQUE: Multidetector CT imaging of the abdomen and pelvis was performed following the standard protocol without IV contrast. COMPARISON:  10/15/2016 CT abdomen and pelvis. FINDINGS: Lower chest: No acute abnormality. Hepatobiliary: No focal liver abnormality is seen. No gallstones, gallbladder wall thickening, or biliary dilatation. Pancreas: Unremarkable. No pancreatic ductal dilatation or surrounding inflammatory changes. Spleen: Normal in size without focal abnormality. Adrenals/Urinary Tract: Adrenal glands are unremarkable. Kidneys are normal, without renal calculi, focal lesion, or hydronephrosis. Bladder is collapsed around a Foley catheter. Stomach/Bowel: Stomach is within normal limits. Appendix not identified. Wall thickening of the anterior rectum at the level of prostate. Otherwise no evidence of bowel wall thickening, distention, or inflammatory changes. Mild sigmoid diverticulosis. Vascular/Lymphatic: Aortic atherosclerosis. No enlarged abdominal or pelvic lymph nodes. Reproductive: Prostate is enlarged and low in attenuation and there is surrounding edema within the pelvis as well as in the adjacent anterior wall of the rectum. No discrete fluid collection is identified. Other: Small paraumbilical  hernia containing fat. Musculoskeletal: No fracture is seen. Calcified L4-5 disc bulge encroaching on the neural foramen and resulting in at least mild spinal canal stenosis. IMPRESSION: 1. Enlarged hypodense prostate with surrounding edema in the pelvis as well as wall thickening of adjacent anterior rectum. Findings likely represent acute prostatitis. No discrete abscess identified. 2.  Aortic Atherosclerosis (ICD10-I70.0). 3. Small paraumbilical hernia containing fat. 4. Calcified L4-5 disc bulge encroaching on the neural foramen and resulting in at least mild spinal canal stenosis. Electronically Signed   By: Kristine Garbe M.D.   On: 01/30/2019 02:44      IMPRESSION AND PLAN:   1.  Sepsis secondary to UTI.  The patient will be admitted to a medical monitored bed.  He will be placed on hydration with IV normal saline and continued antibiotic therapy with IV Cipro for better prostate penetration.  We will continue his Ditropan.  Pain management will  be provided.  Will follow blood and urine cultures.  2.  Acute prostatitis.  This is likely the culprit for #1.  Management as above with IV Cipro.  3.  Type 2 diabetes mellitus.  The patient will be placed on supplemental coverage with NovoLog and will continue his basal coverage with Lantus.  4.  Dyslipidemia.  Statin therapy will be resumed.  5.  Peripheral neuropathy.  Neurontin will be continued.  6.  DVT prophylaxis.  Subcutaneous Lovenox.  All the records are reviewed and case discussed with ED provider. The plan of care was discussed in details with the patient (and family). I answered all questions. The patient agreed to proceed with the above mentioned plan. Further management will depend upon hospital course.   CODE STATUS: Full code  TOTAL TIME TAKING CARE OF THIS PATIENT: 50   minutes.    Christel Mormon M.D on 01/30/2019 at 7:31 AM  Pager - [phone]  After 6pm go to [email or web address] - Proofreader  [facility]  Office  [phone]  CC: Primary care physician; Letta Median, MD   Note: This dictation was prepared with Dragon dictation along with smaller phrase technology. Any transcriptional errors that result from this process are unintentional.

## 2019-01-30 NOTE — ED Triage Notes (Signed)
Patient coming in tonight for abd pain that started wednesday at 3am. Patient denies any nausea and vomiting. Patient states he is having trouble peeing when he stands up but he can pee laying down because of the pain. Patient appears to be very uncomfortable.

## 2019-01-30 NOTE — Progress Notes (Signed)
Advanced care plan. Purpose of the Encounter: CODE STATUS Parties in Attendance: Patient Patient's Decision Capacity: Good Subjective/Patient's story: Michael Gill  is a 58 y.o. male with a known history of multiple medical problems that are mentioned below including asthma, [name], vitamin B12 deficiency and CVA, who presented to the emergency room acute onset of difficulty with urination with significant dysuria and what he describes as excruciating pain that started on Wednesday at 3 AM.  He had associated lower abdominal pain.  He denied any fever or chills.  No nausea or vomiting.  No cough or wheezing or shortness of breath.  No chest pain or palpitations.  No recent sick exposures. Upon presentation to the emergency room, heart rate was 121 with otherwise normal vital signs and temperature was 98.7.  Labs revealed leukocytosis of 14.9 and BUN was 20 with creatinine of 1.23.  Lactic acid was 1.8 and repeat level was similar.  UA was strongly positive for UTI.  Novel coronavirus sent out lab is pending.  Abdominal pelvic CT scan revealed enlarged hypodense prostate with surrounding edema as well as thickening of the adjacent anterior rectum with findings likely representing acute prostatitis.  There was aortic atherosclerosis and small periumbilical hernia containing fat as well as calcified L4-5 disc bulge with mild spinal canal stenosis. The patient was given IV Levaquin, 4 mg IV morphine sulfate and 4 mg of IV Zofran as well as 5 mg of p.o. Ditropan with IV normal saline boluses.  He will be admitted to the medical monitored bed for further evaluation and management. Objective/Medical story Patient needs IV antibiotics and IV fluids.  Needs cultures and antibiotic therapy. Goals of care determination:  Advance care directives goals of care and treatment plan discussed Patient wants everything done which includes CPR, intubation ventilator if the need arises CODE STATUS: Full  code Time spent discussing advanced care planning: 16 minutes

## 2019-01-30 NOTE — ED Notes (Signed)
ED TO INPATIENT HANDOFF REPORT  ED Nurse Name and Phone #:  Anson Crofts Name/Age/Gender Michael Gill 58 y.o. male Room/Bed: ED15A/ED15A  Code Status   Code Status: Full Code  Home/SNF/Other Home Patient oriented to: self, place, time and situation Is this baseline? Yes   Triage Complete: Triage complete  Chief Complaint unable to urinate  Triage Note Patient coming in tonight for abd pain that started wednesday at 3am. Patient denies any nausea and vomiting. Patient states he is having trouble peeing when he stands up but he can pee laying down because of the pain. Patient appears to be very uncomfortable.     Allergies Allergies  Allergen Reactions  . Shellfish Allergy Hives    Swelling of throat and mouth. Betadine is NOT a problem  . Codeine Other (See Comments)    Reaction:  Hallucinations   . Sulfa Antibiotics Hives    Level of Care/Admitting Diagnosis ED Disposition    ED Disposition Condition [facility]: [place] [[id number]]  Level of Care: Med-Surg [16]  Covid Evaluation: Screening Protocol (No Symptoms)  Diagnosis: Sepsis [facility]) [[id number]]  Admitting Physician: Christel Mormon [[id number]]  Attending Physician: Christel Mormon [[id number]]  Estimated length of stay: past midnight tomorrow  Certification:: I certify this patient will need inpatient services for at least 2 midnights  PT Class (Do Not Modify): Inpatient [101]  PT Acc Code (Do Not Modify): Private [1]       B Medical/Surgery History Past Medical History:  Diagnosis Date  . Asthma   . B12 deficiency    in the past  . CAD (coronary artery disease)    s/p MI  . Colon cancer ([place]) 1990  . Complication of anesthesia    long time to wake up from colonoscopy.  . Conversion disorder    difficulty with visual disturbances and changes with unknown etiology, along with headaches  . CVA (cerebral infarction)   . 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 DVT (deep venous thrombosis) ([place]) 2016   behind right knee  . Dyspnea 09/2018   with exercise  . Dysrhythmia    has had Afib in past.  . GERD (gastroesophageal reflux disease)   . History of kidney stones 1990  . Hx of therapeutic radiation   . Hyperlipidemia   . Myocardial infarction ([place]) 1990  . Neuropathic pain, leg   . 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  . Seizure ([place])    last episode 08/2017. has no warning, will just happen. nothing specific precipitates event  . Sleep apnea    does not use cpap  . 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 Stroke [facility]) 2012, 03/2018   residual tremor left hand, facial droop  . Tremors of nervous system    specifically left hand, since stroke   Past Surgical History:  Procedure Laterality Date  . APPENDECTOMY  1971  . CARDIAC CATHETERIZATION Left 10/12/2015   Procedure: Left Heart Cath and Coronary Angiography;  Surgeon: Yolonda Kida, MD;  Location: [facility];  Service: Cardiovascular;  Laterality: Left;  . TONSILLECTOMY       A IV Location/Drains/Wounds Patient Lines/Drains/Airways Status   Active Line/Drains/Airways    Name:   Placement date:   Placement time:   Site:  Days:   Peripheral IV 02/24/18 Left;Upper Arm   02/24/18    1846    Arm   340   Peripheral IV 01/30/19 Right;Upper Arm   01/30/19    0430    Arm   less than 1   Urethral Catheter Kayla,NT Latex 14 Fr.   01/30/19    0225    Latex   less than 1          Intake/Output Last 24 hours No intake or output data in the 24 hours ending 01/30/19 0455  Labs/Imaging Results for orders placed or performed during the hospital encounter of 01/30/19 (from the past 48 hour(s))  Lipase, blood     Status: None   Collection Time: 01/30/19 12:12 AM  Result Value Ref Range   Lipase 28 11 - 51 U/L    Comment: Performed at [facility], [address] [place], [place] [address]  Comprehensive metabolic panel     Status: Abnormal   Collection Time: 01/30/19 12:12 AM  Result Value Ref Range   Sodium 134 (L) 135 - 145 mmol/L   Potassium 4.5 3.5 - 5.1 mmol/L   Chloride 101 98 - 111 mmol/L   CO2 23 22 - 32 mmol/L   Glucose, Bld 244 (H) 70 - 99 mg/dL   BUN 20 6 - 20 mg/dL   Creatinine, Ser 1.48 (H) 0.61 - 1.24 mg/dL   Calcium 8.4 (L) 8.9 - 10.3 mg/dL   Total Protein 6.8 6.5 - 8.1 g/dL   Albumin 3.5 3.5 - 5.0 g/dL   AST 17 15 - 41 U/L   ALT 20 0 - 44 U/L   Alkaline Phosphatase 52 38 - 126 U/L   Total Bilirubin 2.6 (H) 0.3 - 1.2 mg/dL   GFR calc non Af Amer 51 (L) >60 mL/min   GFR calc Af Amer 59 (L) >60 mL/min   Anion gap 10 5 - 15    Comment: Performed at [facility], [address] [place], [place] [address]  CBC     Status: Abnormal   Collection Time: 01/30/19 12:12 AM  Result Value Ref Range   WBC 19.1 (H) 4.0 - 10.5 K/uL   RBC 4.86 4.22 - 5.81 MIL/uL   Hemoglobin 15.2 13.0 - 17.0 g/dL   HCT 44.0 39.0 - 52.0 %   MCV 90.5 80.0 - 100.0 fL   MCH 31.3 26.0 - 34.0 pg   MCHC 34.5 30.0 - 36.0 g/dL   RDW 12.7 11.5 - 15.5 %   Platelets 187 150 - 400 K/uL   nRBC 0.0 0.0 - 0.2 %    Comment: Performed at [facility], [address] [place], [place] [address]  Procalcitonin     Status: None   Collection Time: 01/30/19 12:12 AM  Result Value Ref Range   Procalcitonin 23.13 ng/mL    Comment:        Interpretation: PCT >= 10 ng/mL: Important systemic inflammatory response, almost exclusively due to severe bacterial sepsis or septic shock. (NOTE)       Sepsis PCT Algorithm           Lower Respiratory Tract                                      Infection PCT Algorithm    ----------------------------     ----------------------------  PCT < 0.25 ng/mL                PCT < 0.10 ng/mL         Strongly encourage             Strongly discourage   discontinuation of antibiotics     initiation of antibiotics    ----------------------------     -----------------------------       PCT 0.25 - 0.50 ng/mL            PCT 0.10 - 0.25 ng/mL               OR       >80% decrease in PCT            Discourage initiation of                                            antibiotics      Encourage discontinuation           of antibiotics    ----------------------------     -----------------------------         PCT >= 0.50 ng/mL              PCT 0.26 - 0.50 ng/mL                AND       <80% decrease in PCT             Encourage initiation of                                             antibiotics       Encourage continuation           of antibiotics    ----------------------------     -----------------------------        PCT >= 0.50 ng/mL                  PCT > 0.50 ng/mL               AND         increase in PCT                  Strongly encourage                                      initiation of antibiotics    Strongly encourage escalation           of antibiotics                                     -----------------------------                                           PCT <= 0.25 ng/mL  OR                                        > 80% decrease in PCT                                     Discontinue / Do not initiate                                             antibiotics Performed at [facility], [address] [place], [place] [address]   Urinalysis, Complete w Microscopic     Status: Abnormal   Collection Time: 01/30/19  1:12 AM  Result Value Ref Range   Color, Urine AMBER (A) YELLOW    Comment: BIOCHEMICALS MAY BE AFFECTED BY COLOR   APPearance TURBID (A) CLEAR   Specific Gravity, Urine 1.027 1.005 - 1.030   pH 5.0 5.0 - 8.0   Glucose, UA 150 (A) NEGATIVE mg/dL   Hgb urine dipstick LARGE (A) NEGATIVE   Bilirubin Urine NEGATIVE NEGATIVE   Ketones, ur 20 (A) NEGATIVE mg/dL   Protein, ur 100 (A) NEGATIVE  mg/dL   Nitrite NEGATIVE NEGATIVE   Leukocytes,Ua MODERATE (A) NEGATIVE   RBC / HPF >50 (H) 0 - 5 RBC/hpf   WBC, UA >50 (H) 0 - 5 WBC/hpf   Bacteria, UA NONE SEEN NONE SEEN   Squamous Epithelial / LPF 0-5 0 - 5   WBC Clumps PRESENT    Mucus PRESENT     Comment: Performed at [facility], [address] [place], [place] [address]  Lactic acid, plasma     Status: None   Collection Time: 01/30/19  2:30 AM  Result Value Ref Range   Lactic Acid, Venous 1.8 0.5 - 1.9 mmol/L    Comment: Performed at [facility], [address] [place], [place] [address]  Lactic acid, plasma     Status: None   Collection Time: 01/30/19  3:59 AM  Result Value Ref Range   Lactic Acid, Venous 1.8 0.5 - 1.9 mmol/L    Comment: Performed at [facility], [address] [place], [place] [address]  Protime-INR     Status: None   Collection Time: 01/30/19  3:59 AM  Result Value Ref Range   Prothrombin Time 14.1 11.4 - 15.2 seconds   INR 1.1 0.8 - 1.2    Comment: (NOTE) INR goal varies based on device and disease states. Performed at [facility], [address] [place],  [address]    Ct Renal Joaquim Lai Study  Result Date: 01/30/2019 CLINICAL DATA:  58 y/o M; Flank pain, stone disease suspected acute urinary retention and acute kidney injury, hematuria, probable UTI vs pyelonephritis, prostatitis also very likely. EXAM: CT ABDOMEN AND PELVIS WITHOUT CONTRAST TECHNIQUE: Multidetector CT imaging of the abdomen and pelvis was performed following the standard protocol without IV contrast. COMPARISON:  10/15/2016 CT abdomen and pelvis. FINDINGS: Lower chest: No acute abnormality. Hepatobiliary: No focal liver abnormality is seen. No gallstones, gallbladder wall thickening, or biliary dilatation. Pancreas: Unremarkable. No pancreatic ductal dilatation or surrounding inflammatory changes. Spleen: Normal in size without focal abnormality. Adrenals/Urinary Tract: Adrenal glands  are unremarkable. Kidneys  are normal, without renal calculi, focal lesion, or hydronephrosis. Bladder is collapsed around a Foley catheter. Stomach/Bowel: Stomach is within normal limits. Appendix not identified. Wall thickening of the anterior rectum at the level of prostate. Otherwise no evidence of bowel wall thickening, distention, or inflammatory changes. Mild sigmoid diverticulosis. Vascular/Lymphatic: Aortic atherosclerosis. No enlarged abdominal or pelvic lymph nodes. Reproductive: Prostate is enlarged and low in attenuation and there is surrounding edema within the pelvis as well as in the adjacent anterior wall of the rectum. No discrete fluid collection is identified. Other: Small paraumbilical hernia containing fat. Musculoskeletal: No fracture is seen. Calcified L4-5 disc bulge encroaching on the neural foramen and resulting in at least mild spinal canal stenosis. IMPRESSION: 1. Enlarged hypodense prostate with surrounding edema in the pelvis as well as wall thickening of adjacent anterior rectum. Findings likely represent acute prostatitis. No discrete abscess identified. 2.  Aortic Atherosclerosis (ICD10-I70.0). 3. Small paraumbilical hernia containing fat. 4. Calcified L4-5 disc bulge encroaching on the neural foramen and resulting in at least mild spinal canal stenosis. Electronically Signed   By: Kristine Garbe M.D.   On: 01/30/2019 02:44    Pending Labs Unresulted Labs (From admission, onward)    Start     Ordered   02/06/19 0500  Creatinine, serum  (enoxaparin (LOVENOX)    CrCl >/= 30 ml/min)  Weekly,   STAT    Comments: while on enoxaparin therapy    01/30/19 0413   01/30/19 5462  Basic metabolic panel  Tomorrow morning,   STAT     01/30/19 0413   01/30/19 0500  CBC  Tomorrow morning,   STAT     01/30/19 0413   01/30/19 0414  HIV antibody (Routine Testing)  Once,   STAT     01/30/19 0413   01/30/19 0338  Novel Coronavirus,NAA,(SEND-OUT TO REF LAB - TAT 24-48 hrs); Hosp  Order  (Asymptomatic Patients Labs)  Once,   STAT    Question:  Rule Out  Answer:  Yes   01/30/19 0337   01/30/19 0330  Blood Culture (routine x 2)  BLOOD CULTURE X 2,   STAT     01/30/19 0333   01/30/19 0147  Urine Culture  Add-on,   AD    Question:  Patient immune status  Answer:  Normal   01/30/19 0146          Vitals/Pain Today's Vitals   01/30/19 0130 01/30/19 0200 01/30/19 0300 01/30/19 0330  BP: 101/76 108/73 121/77 129/79  Pulse:   (!) 106 (!) 107  Resp:      Temp:      TempSrc:      SpO2:   97% 98%  Weight:      Height:      PainSc:        Isolation Precautions No active isolations  Medications Medications  cefTRIAXone (ROCEPHIN) 1 g in sodium chloride 0.9 % 100 mL IVPB (0 g Intravenous Stopped 01/30/19 0358)  levofloxacin (LEVAQUIN) IVPB 500 mg (500 mg Intravenous New Bag/Given 01/30/19 0443)  aspirin EC tablet 81 mg (has no administration in time range)  atorvastatin (LIPITOR) tablet 10 mg (has no administration in time range)  insulin aspart protamine- aspart (NOVOLOG MIX 70/30) injection 43-47 Units (has no administration in time range)  bismuth subsalicylate (PEPTO BISMOL) chewable tablet 524 mg (has no administration in time range)  meclizine (ANTIVERT) tablet 25 mg (has no administration in time range)  pantoprazole (PROTONIX) EC tablet 40 mg (has no administration in  time range)  gabapentin (NEURONTIN) capsule 100 mg (has no administration in time range)  albuterol (VENTOLIN HFA) 108 (90 Base) MCG/ACT inhaler 1-2 puff (has no administration in time range)  loratadine (CLARITIN) tablet 10 mg (has no administration in time range)  diphenhydrAMINE (BENADRYL) tablet 25 mg (has no administration in time range)  fluticasone (FLONASE) 50 MCG/ACT nasal spray 1 spray (has no administration in time range)  enoxaparin (LOVENOX) injection 40 mg (has no administration in time range)  0.9 %  sodium chloride infusion (has no administration in time range)  acetaminophen  (TYLENOL) tablet 650 mg (has no administration in time range)    Or  acetaminophen (TYLENOL) suppository 650 mg (has no administration in time range)  traZODone (DESYREL) tablet 25 mg (has no administration in time range)  magnesium hydroxide (MILK OF MAGNESIA) suspension 30 mL (has no administration in time range)  ondansetron (ZOFRAN) tablet 4 mg (has no administration in time range)    Or  ondansetron (ZOFRAN) injection 4 mg (has no administration in time range)  ciprofloxacin (CIPRO) IVPB 400 mg (has no administration in time range)  oxybutynin (DITROPAN) tablet 5 mg (5 mg Oral Given 01/30/19 0246)  sodium chloride 0.9 % bolus 1,000 mL (0 mLs Intravenous Stopped 01/30/19 0444)  sodium chloride 0.9 % bolus 1,250 mL (1,250 mLs Intravenous New Bag/Given 01/30/19 0442)  morphine 4 MG/ML injection 4 mg (4 mg Intravenous Given 01/30/19 0420)  ondansetron (ZOFRAN) injection 4 mg (4 mg Intravenous Given 01/30/19 0420)    Mobility walks Low fall risk   Focused Assessments Renal Assessment Handoff:          R Recommendations: See Admitting Provider Note  Report given to:   Additional Notes:

## 2019-01-30 NOTE — Progress Notes (Signed)
CODE SEPSIS - PHARMACY COMMUNICATION  **Broad Spectrum Antibiotics should be administered within 1 hour of Sepsis diagnosis**  Time Code Sepsis Called/Page Received: 1916  Antibiotics Ordered: ceftriaxone  Time of 1st antibiotic administration: 0244  Additional action taken by pharmacy:   If necessary, Name of Provider/Nurse Contacted:     Tobie Lords ,PharmD Clinical Pharmacist  01/30/2019  3:41 AM

## 2019-01-30 NOTE — ED Notes (Signed)
Patient transported to CT 

## 2019-01-30 NOTE — Progress Notes (Signed)
Patient had shaking episode with excessive sweating. CBG elevated. HR elevated. MD made aware of above. Order given for metoprolol. Waiting for response from MD for CBG orders. Madlyn Frankel, RN

## 2019-01-30 NOTE — ED Provider Notes (Signed)
[facility] Emergency Department Provider Note  ____________________________________________   None    (approximate)  I have reviewed the triage vital signs and the nursing notes.   HISTORY  Chief Complaint Abdominal Pain and Urinary Retention    HPI Michael Gill is a 58 y.o. male with medical history as listed below with no known prostate or urinary issues in the past who presents for evaluation of about 24 hours of difficulty urinating.  He says he has to lay in certain positions such as completely flat in order to urinate.  He is having some increasing lower abdominal discomfort that he describes as an aching pain.  He is produced only a small amount of urine today.  When he does urinate he says that it has severe burning.  He denies fever/chills, sore throat, chest pain, shortness of breath, nausea, and vomiting.  He has not had these symptoms in the past.  He describes them as severe and nothing in particular makes them better except for lying in certain positions and nothing in particular makes them worse.  He has not had decreased oral intake today.         Past Medical History:  Diagnosis Date   Asthma    B12 deficiency    in the past   CAD (coronary artery disease)    s/p MI   Colon cancer ([place]) 0093   Complication of anesthesia    long time to wake up from colonoscopy.   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   Seizure ([place])    last episode 08/2017. has no warning, will just happen. 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    difficulty forming words at times   Stroke [facility]) 2012, 03/2018   residual tremor left hand, facial droop   Tremors of nervous system    specifically left hand, since stroke    Patient Active Problem List   Diagnosis Date Noted   Deep vein thrombosis (DVT) of popliteal vein of right lower extremity ([place]) 10/30/2014   TIA (transient ischemic attack) 01/19/2014    Past Surgical History:  Procedure Laterality Date   APPENDECTOMY  1971   CARDIAC CATHETERIZATION Left 10/12/2015   Procedure: Left Heart Cath and Coronary Angiography;  Surgeon: Yolonda Kida, MD;  Location: [facility];  Service: Cardiovascular;  Laterality: Left;   TONSILLECTOMY      Prior to Admission medications   Medication Sig Start Date End Date Taking? Authorizing Provider  acetaminophen (TYLENOL) 500 MG tablet Take 500 mg by mouth every 6 (six) hours as needed for moderate pain.    [provider]  albuterol (PROVENTIL HFA;VENTOLIN HFA) 108 (90 Base) MCG/ACT  inhaler Inhale 1-2 puffs into the lungs every 4 (four) hours as needed for wheezing or shortness of breath.    [provider]  aspirin EC 81 MG tablet Take 81 mg by mouth every morning.     [provider]  atorvastatin (LIPITOR) 10 MG tablet Take 10 mg by mouth daily.    [provider]  bismuth subsalicylate (PEPTO BISMOL) 262 MG chewable tablet Chew 524 mg by mouth as needed.    [provider]  cetirizine (ZYRTEC) 10 MG tablet Take 10 mg by mouth daily.    [provider]  diphenhydrAMINE (BENADRYL) 25 MG tablet Take 25 mg by mouth every 6 (six) hours as needed.    [provider]  fluticasone (FLONASE)  50 MCG/ACT nasal spray Place 1 spray into both nostrils daily as needed for allergies or rhinitis.    [provider]  gabapentin (NEURONTIN) 100 MG capsule Take 100 mg by mouth 3 (three) times daily.     [provider]  ibuprofen (ADVIL,MOTRIN) 600 MG tablet Take 600 mg by mouth every 8 (eight) hours as needed (PAIN).    [provider]  insulin aspart protamine- aspart (NOVOLOG MIX 70/30) (70-30) 100 UNIT/ML injection Inject 43-47 Units into the skin See admin instructions. 47 UNITS IN THE MORNING, 43 UNITS AT BEDTIME    [provider]  meclizine (ANTIVERT) 25 MG tablet Take 25 mg by mouth 3 (three) times daily as needed for dizziness.    [provider]  omeprazole (PRILOSEC) 40 MG capsule Take 40 mg by mouth daily.    [provider]    Allergies Shellfish allergy, Codeine, and Sulfa antibiotics  Family History  Problem Relation Age of Onset   Alzheimer's disease Mother    Cancer Brother     Social History Social History   Tobacco Use   Smoking status: Never Smoker   Smokeless tobacco: Never Used  Substance Use Topics   Alcohol use: No   Drug use: No    Review of Systems Constitutional: No fever/chills Eyes: No visual changes. ENT: No sore throat. Cardiovascular: Denies chest pain. Respiratory: Denies shortness of breath. Gastrointestinal: Aching lower abdominal pain.  No nausea, no vomiting.  No diarrhea.  No constipation. Genitourinary: Urinary retention and burning dysuria.  Decreased urinary output when he is able to urinate. Musculoskeletal: Negative for neck pain.  Negative for back pain. Integumentary: Negative for rash. Neurological: Negative for headaches, focal weakness or numbness.   ____________________________________________   PHYSICAL EXAM:  VITAL SIGNS: ED Triage Vitals [01/30/19 0007]  Enc Vitals Group     BP 107/72     Pulse Rate (!) 121     Resp 19     Temp 98.7 F (37.1 C)      Temp Source Oral     SpO2 96 %     Weight 117.9 kg (260 lb)     Height 1.727 m (5\' 8" )     Head Circumference      Peak Flow      Pain Score 8     Pain Loc      Pain Edu?      Excl. in [place]?     Constitutional: Alert and oriented. Well appearing and in no acute distress although he does appear uncomfortable. Eyes: Conjunctivae are normal.  Head: Atraumatic. Nose: No congestion/rhinnorhea. Mouth/Throat: Mucous membranes are moist. Neck: No stridor.  No meningeal signs.   Cardiovascular: Normal rate, regular rhythm. Good peripheral circulation.  Grossly normal heart sounds. Respiratory: Normal respiratory effort.  No retractions. No audible wheezing. Gastrointestinal: Soft and nontender. No distention.  Genitourinary: No gross abnormalities Musculoskeletal: No lower extremity tenderness nor edema. No gross deformities of extremities. Neurologic:  Normal speech and language. No gross focal neurologic deficits are appreciated.  Skin:  Skin is warm, dry and intact. No rash noted. Psychiatric: Mood and affect are normal. Speech and behavior are normal.  ____________________________________________   LABS (all labs ordered are listed, but only abnormal results are displayed)  Labs Reviewed  COMPREHENSIVE METABOLIC PANEL - Abnormal; Notable for the following components:      Result Value   Sodium 134 (*)    Glucose, Bld 244 (*)    Creatinine, Ser 1.48 (*)    Calcium 8.4 (*)    Total Bilirubin 2.6 (*)    GFR calc non Af Amer 51 (*)    GFR calc Af Amer 59 (*)    All other components within normal limits  CBC - Abnormal; Notable for the following components:   WBC 19.1 (*)    All other components within normal limits  URINALYSIS, COMPLETE (UACMP) WITH MICROSCOPIC - Abnormal; Notable for the following components:   Color, Urine AMBER (*)    APPearance TURBID (*)    Glucose, UA 150 (*)    Hgb urine dipstick LARGE (*)    Ketones, ur 20 (*)    Protein, ur 100 (*)    Leukocytes,Ua  MODERATE (*)    RBC / HPF >50 (*)    WBC, UA >50 (*)    All other components within normal limits  URINE CULTURE  CULTURE, BLOOD (ROUTINE X 2)  CULTURE, BLOOD (ROUTINE X 2)  NOVEL CORONAVIRUS, NAA (HOSPITAL ORDER, SEND-OUT TO REF LAB)  LIPASE, BLOOD  LACTIC ACID, PLASMA  PROCALCITONIN  LACTIC ACID, PLASMA  PROTIME-INR   ____________________________________________  EKG  ED ECG REPORT I, Hinda Kehr, the attending physician, personally viewed and interpreted this ECG.  Date: 01/30/2019 EKG Time: 00:54 Rate: 106 Rhythm: mild sinus tachycardia QRS Axis: normal Intervals: normal ST/T Wave abnormalities: normal Narrative Interpretation: no evidence of acute ischemia  ____________________________________________  RADIOLOGY   ED MD interpretation: CT scan suggestive of acute prostatitis and prostatic hypertrophy with some edema in the pelvis and some wall thickening of the anterior rectum with no discrete abscess.  Official radiology report(s): Ct Renal Stone Study  Result Date: 01/30/2019 CLINICAL DATA:  58 y/o M; Flank pain, stone disease suspected acute urinary retention and acute kidney injury, hematuria, probable UTI vs pyelonephritis, prostatitis also very likely. EXAM: CT ABDOMEN AND PELVIS WITHOUT CONTRAST TECHNIQUE: Multidetector CT imaging of the abdomen and pelvis was performed following the standard protocol without IV contrast. COMPARISON:  10/15/2016 CT abdomen and pelvis. FINDINGS: Lower chest: No acute abnormality. Hepatobiliary: No focal liver abnormality is seen. No gallstones, gallbladder wall thickening, or biliary dilatation. Pancreas: Unremarkable. No pancreatic ductal dilatation or surrounding inflammatory changes. Spleen: Normal in size without focal abnormality. Adrenals/Urinary Tract: Adrenal glands are unremarkable. Kidneys are normal, without renal calculi, focal lesion, or hydronephrosis. Bladder is collapsed around a Foley catheter. Stomach/Bowel:  Stomach is within normal limits. Appendix not identified. Wall thickening of the anterior rectum at the level of prostate. Otherwise no evidence of bowel wall thickening, distention, or inflammatory changes. Mild sigmoid diverticulosis. Vascular/Lymphatic: Aortic atherosclerosis. No enlarged abdominal or pelvic lymph nodes. Reproductive: Prostate is enlarged and low in attenuation and there is surrounding edema within the pelvis as well as in  the adjacent anterior wall of the rectum. No discrete fluid collection is identified. Other: Small paraumbilical hernia containing fat. Musculoskeletal: No fracture is seen. Calcified L4-5 disc bulge encroaching on the neural foramen and resulting in at least mild spinal canal stenosis. IMPRESSION: 1. Enlarged hypodense prostate with surrounding edema in the pelvis as well as wall thickening of adjacent anterior rectum. Findings likely represent acute prostatitis. No discrete abscess identified. 2.  Aortic Atherosclerosis (ICD10-I70.0). 3. Small paraumbilical hernia containing fat. 4. Calcified L4-5 disc bulge encroaching on the neural foramen and resulting in at least mild spinal canal stenosis. Electronically Signed   By: Kristine Garbe M.D.   On: 01/30/2019 02:44    ____________________________________________   PROCEDURES   Procedure(s) performed (including Critical Care):  .Critical Care Performed by: Hinda Kehr, MD Authorized by: Hinda Kehr, MD   Critical care provider statement:    Critical care time (minutes):  30   Critical care time was exclusive of:  Separately billable procedures and treating other patients   Critical care was necessary to treat or prevent imminent or life-threatening deterioration of the following conditions:  Sepsis   Critical care was time spent personally by me on the following activities:  Development of treatment plan with patient or surrogate, discussions with consultants, evaluation of patient's response to  treatment, examination of patient, obtaining history from patient or surrogate, ordering and performing treatments and interventions, ordering and review of laboratory studies, ordering and review of radiographic studies, pulse oximetry, re-evaluation of patient's condition and review of old charts     ____________________________________________   [place] / MDM / [place] / ED COURSE  As part of my medical decision making, I reviewed the following data within the [place] notes reviewed and incorporated, Labs reviewed , Old chart reviewed, Discussed with admitting physician (Dr. Sidney Ace) and Notes from prior ED visits        Differential diagnosis includes, but is not limited to, sepsis, acute prostatitis, BPH, other nonspecific urinary retention, acute kidney injury, dehydration.  The patient has no signs or symptoms of COVID-19 and no risk factors.  He has no respiratory symptoms and no indication of a cardiac issue.  Blood pressure is stable.  The patient was initially tachycardic on triage but the tachycardia has improved to borderline at heart rate around 100.  He was acutely uncomfortable and had only minimal urine in the bladder on postvoid residual but he still felt like he needed to go.  At my order the nurse placed a Foley catheter given his acute urinary retention relatively minimal urine was returned but the urine that was sent to the lab indicates probable acute infection as well as hematuria.  I am strongly suspicious for acute prostatitis given his constellation of symptoms and his white blood cell count of 19.  I am sending a lactic acid and procalcitonin and treating empirically with ceftriaxone 1 g IV.  I have ordered a CT renal stone protocol given his acute kidney injury (his creatinine is elevated from his baseline of about 0.9 to approximately 1.5) and I am giving 1 L normal saline IV fluid bolus.  He had some significant bladder  spasms after the Foley catheter placed so I ordered oxybutynin 5 mg by mouth.  Clinical Course as of Jan 29 406  Thu Jan 30, 2019  0324 CT scan is concerning for acute prostatitis.  More concerning is his procalcitonin of 23.  Combined with a white blood cell count  of 19, even in spite of the normal lactic acid, I believe he definitely qualifies for sepsis.  His blood pressures stable.  I will provide a total of 30 mL/kg normal saline, call a code sepsis, and order empiric antibiotics.  He is already received ceftriaxone 1 g IV but I am going to broaden the coverage with IV Levaquin given the concern for prostatitis since it has better penetration of the prostate and a cephalosporins.  I discussed the plan with the patient and he is in agreement.  CT Renal [place] [CF]  0814 Send secure chat message to Dr. Sidney Ace and Gardiner Barefoot with the hospitalist service for admission.   [CF]  0400 Received word back from Dr. Sidney Ace acknowledging the admission.  Send out COVID-19 test per hospital protocol given no respiratory symptoms, no fever, and clear alterative diagnosis   [CF]  0406 Patient does meet sepsis criteria and protocol/code sepsis was ordered. Blood cultures were not obtained prior to ceftriaxone, but I have ordered them given the sepsis diagnosis and elevated procalcitonin.   [CF]    Clinical Course User Index [CF] Hinda Kehr, MD     ____________________________________________  FINAL CLINICAL IMPRESSION(S) / ED DIAGNOSES  Final diagnoses:  Sepsis, due to unspecified organism, unspecified whether acute organ dysfunction present ([place])  Acute kidney injury ([place])  Acute urinary retention  Acute prostatitis with hematuria  Urinary tract infection with hematuria, site unspecified     MEDICATIONS GIVEN DURING THIS VISIT:  Medications  cefTRIAXone (ROCEPHIN) 1 g in sodium chloride 0.9 % 100 mL IVPB (0 g Intravenous Stopped 01/30/19 0358)  sodium chloride 0.9 % bolus 1,250 mL (has  no administration in time range)  levofloxacin (LEVAQUIN) IVPB 500 mg (has no administration in time range)  morphine 4 MG/ML injection 4 mg (has no administration in time range)  ondansetron (ZOFRAN) injection 4 mg (has no administration in time range)  oxybutynin (DITROPAN) tablet 5 mg (5 mg Oral Given 01/30/19 0246)  sodium chloride 0.9 % bolus 1,000 mL (1,000 mLs Intravenous New Bag/Given 01/30/19 0245)     ED Discharge Orders    None      *Please note:  Michael Gill was evaluated in Emergency Department on 01/30/2019 for the symptoms described in the history of present illness. He was evaluated in the context of the global COVID-19 pandemic, which necessitated consideration that the patient might be at risk for infection with the SARS-CoV-2 virus that causes COVID-19. Institutional protocols and algorithms that pertain to the evaluation of patients at risk for COVID-19 are in a state of rapid change based on information released by regulatory bodies including the CDC and federal and state organizations. These policies and algorithms were followed during the patient's care in the ED.  Some ED evaluations and interventions may be delayed as a result of limited staffing during the pandemic.*  Note:  This document was prepared using Dragon voice recognition software and may include unintentional dictation errors.   Hinda Kehr, MD 01/30/19 [phone]

## 2019-01-30 NOTE — Progress Notes (Signed)
[facility] [place] NAME: Michael Gill    MR#:  [id number]  DATE OF BIRTH:  [birth year]  SUBJECTIVE:  CHIEF COMPLAINT:   Chief Complaint  Patient presents with  . Abdominal Pain  . Urinary Retention  Patient seen and evaluated today Decreased abdominal pain No fever Has generalized weakness  REVIEW OF SYSTEMS:    ROS  CONSTITUTIONAL: No documented fever. Has fatigue, weakness. No weight gain, no weight loss.  EYES: No blurry or double vision.  ENT: No tinnitus. No postnasal drip. No redness of the oropharynx.  RESPIRATORY: No cough, no wheeze, no hemoptysis. No dyspnea.  CARDIOVASCULAR: No chest pain. No orthopnea. No palpitations. No syncope.  GASTROINTESTINAL: No nausea, no vomiting or diarrhea. Decreased abdominal pain. No melena or hematochezia.  GENITOURINARY: Has dysuria  no hematuria.  ENDOCRINE: No polyuria or nocturia. No heat or cold intolerance.  HEMATOLOGY: No anemia. No bruising. No bleeding.  INTEGUMENTARY: No rashes. No lesions.  MUSCULOSKELETAL: No arthritis. No swelling. No gout.  NEUROLOGIC: No numbness, tingling, or ataxia. No seizure-type activity.  PSYCHIATRIC: No anxiety. No insomnia. No ADD.   DRUG ALLERGIES:   Allergies  Allergen Reactions  . Shellfish Allergy Hives    Swelling of throat and mouth. Betadine is NOT a problem  . Codeine Other (See Comments)    Reaction:  Hallucinations   . Sulfa Antibiotics Hives    VITALS:  Blood pressure 129/79, pulse (!) 107, temperature 98.7 F (37.1 C), temperature source Oral, resp. rate 18, height 5\' 8"  (1.727 m), weight 117.9 kg, SpO2 98 %.  PHYSICAL EXAMINATION:   Physical Exam  GENERAL:  58 y.o.-year-old patient lying in the bed with no acute distress.  EYES: Pupils equal, round, reactive to light and accommodation. No scleral icterus. Extraocular muscles intact.  HEENT: Head atraumatic, normocephalic. Oropharynx and nasopharynx clear.  NECK:  Supple, no  jugular venous distention. No thyroid enlargement, no tenderness.  LUNGS: Normal breath sounds bilaterally, no wheezing, rales, rhonchi. No use of accessory muscles of respiration.  CARDIOVASCULAR: S1, S2 normal. No murmurs, rubs, or gallops.  ABDOMEN: Soft, decreased tenderness around the umbilicus, nondistended. Bowel sounds present. No organomegaly or mass.  EXTREMITIES: No cyanosis, clubbing or edema b/l.    NEUROLOGIC: Cranial nerves II through XII are intact. No focal Motor or sensory deficits b/l.   PSYCHIATRIC: The patient is alert and oriented x 3.  SKIN: No obvious rash, lesion, or ulcer.   LABORATORY PANEL:   CBC Recent Labs  Lab 01/30/19 0555  WBC 14.9*  HGB 14.1  HCT 42.2  PLT 148*   ------------------------------------------------------------------------------------------------------------------ Chemistries  Recent Labs  Lab 01/30/19 0012 01/30/19 0555  NA 134* 135  K 4.5 4.0  CL 101 105  CO2 23 22  GLUCOSE 244* 264*  BUN 20 20  CREATININE 1.48* 1.23  CALCIUM 8.4* 7.7*  AST 17  --   ALT 20  --   ALKPHOS 52  --   BILITOT 2.6*  --    ------------------------------------------------------------------------------------------------------------------  Cardiac Enzymes No results for input(s): TROPONINI in the last 168 hours. ------------------------------------------------------------------------------------------------------------------  RADIOLOGY:  Ct Renal Stone Study  Result Date: 01/30/2019 CLINICAL DATA:  58 y/o M; Flank pain, stone disease suspected acute urinary retention and acute kidney injury, hematuria, probable UTI vs pyelonephritis, prostatitis also very likely. EXAM: CT ABDOMEN AND PELVIS WITHOUT CONTRAST TECHNIQUE: Multidetector CT imaging of the abdomen and pelvis was performed following the standard protocol without IV contrast. COMPARISON:  10/15/2016  CT abdomen and pelvis. FINDINGS: Lower chest: No acute abnormality. Hepatobiliary: No focal  liver abnormality is seen. No gallstones, gallbladder wall thickening, or biliary dilatation. Pancreas: Unremarkable. No pancreatic ductal dilatation or surrounding inflammatory changes. Spleen: Normal in size without focal abnormality. Adrenals/Urinary Tract: Adrenal glands are unremarkable. Kidneys are normal, without renal calculi, focal lesion, or hydronephrosis. Bladder is collapsed around a Foley catheter. Stomach/Bowel: Stomach is within normal limits. Appendix not identified. Wall thickening of the anterior rectum at the level of prostate. Otherwise no evidence of bowel wall thickening, distention, or inflammatory changes. Mild sigmoid diverticulosis. Vascular/Lymphatic: Aortic atherosclerosis. No enlarged abdominal or pelvic lymph nodes. Reproductive: Prostate is enlarged and low in attenuation and there is surrounding edema within the pelvis as well as in the adjacent anterior wall of the rectum. No discrete fluid collection is identified. Other: Small paraumbilical hernia containing fat. Musculoskeletal: No fracture is seen. Calcified L4-5 disc bulge encroaching on the neural foramen and resulting in at least mild spinal canal stenosis. IMPRESSION: 1. Enlarged hypodense prostate with surrounding edema in the pelvis as well as wall thickening of adjacent anterior rectum. Findings likely represent acute prostatitis. No discrete abscess identified. 2.  Aortic Atherosclerosis (ICD10-I70.0). 3. Small paraumbilical hernia containing fat. 4. Calcified L4-5 disc bulge encroaching on the neural foramen and resulting in at least mild spinal canal stenosis. Electronically Signed   By: Kristine Garbe M.D.   On: 01/30/2019 02:44     ASSESSMENT AND PLAN:   58 year old male patient with history of vitamin B12 deficiency CVA bronchial asthma, colon cancer, coronary artery disease, GERD, hyperlipidemia presented to the hospital service infection  -Sepsis improving IV fluid hydration Follow-up  cultures Continue antibiotics  -Acute prostatitis Continue IV ciprofloxacin antibiotic and follow-up cultures  -Hyperlipidemia Continue statin therapy  -Peripheral neuropathy Continue Neurontin  -Type 2 diabetes mellitus Diabetic diet with sliding scale coverage with insulin along with Lantus insulin  -DVT prophylaxis subcu Lovenox daily  All the records are reviewed and case discussed with Care Management/Social Worker. Management plans discussed with the patient, family and they are in agreement.  CODE STATUS: Full code  DVT Prophylaxis: SCDs  TOTAL TIME TAKING CARE OF THIS PATIENT: 45 minutes.   POSSIBLE D/C IN 2 to 3 DAYS, DEPENDING ON CLINICAL CONDITION.  Saundra Shelling M.D on 01/30/2019 at 10:24 AM  Between 7am to 6pm - Pager - [phone]  After 6pm go to [email or web address] - password [email or web address]  Office  [phone]  CC: Primary care physician; Letta Median, MD  Note: This dictation was prepared with Dragon dictation along with smaller phrase technology. Any transcriptional errors that result from this process are unintentional.

## 2019-01-31 DIAGNOSIS — N410 Acute prostatitis: Secondary | ICD-10-CM | POA: Diagnosis not present

## 2019-01-31 DIAGNOSIS — A4151 Sepsis due to Escherichia coli [E. coli]: Secondary | ICD-10-CM | POA: Diagnosis not present

## 2019-01-31 LAB — GLUCOSE, CAPILLARY
Glucose-Capillary: 162 mg/dL — ABNORMAL HIGH (ref 70–99)
Glucose-Capillary: 172 mg/dL — ABNORMAL HIGH (ref 70–99)
Glucose-Capillary: 164 mg/dL — ABNORMAL HIGH (ref 70–99)
Glucose-Capillary: 201 mg/dL — ABNORMAL HIGH (ref 70–99)

## 2019-01-31 LAB — CBC
HCT: 38.1 % — ABNORMAL LOW (ref 39.0–52.0)
Hemoglobin: 12.7 g/dL — ABNORMAL LOW (ref 13.0–17.0)
MCH: 31.1 pg (ref 26.0–34.0)
MCHC: 33.3 g/dL (ref 30.0–36.0)
MCV: 93.4 fL (ref 80.0–100.0)
Platelets: 130 10*3/uL — ABNORMAL LOW (ref 150–400)
RBC: 4.08 MIL/uL — ABNORMAL LOW (ref 4.22–5.81)
RDW: 13.2 % (ref 11.5–15.5)
WBC: 12.3 10*3/uL — ABNORMAL HIGH (ref 4.0–10.5)
nRBC: 0 % (ref 0.0–0.2)

## 2019-01-31 LAB — NOVEL CORONAVIRUS, NAA (HOSP ORDER, SEND-OUT TO REF LAB; TAT 18-24 HRS): SARS-CoV-2, NAA: NOT DETECTED

## 2019-01-31 LAB — BASIC METABOLIC PANEL
Anion gap: 6 (ref 5–15)
BUN: 23 mg/dL — ABNORMAL HIGH (ref 6–20)
CO2: 20 mmol/L — ABNORMAL LOW (ref 22–32)
Calcium: 7.5 mg/dL — ABNORMAL LOW (ref 8.9–10.3)
Chloride: 108 mmol/L (ref 98–111)
Creatinine, Ser: 1.16 mg/dL (ref 0.61–1.24)
GFR calc Af Amer: >60 mL/min (ref >60)
GFR calc non Af Amer: >60 mL/min (ref >60)
Glucose, Bld: 209 mg/dL — ABNORMAL HIGH (ref 70–99)
Potassium: 3.7 mmol/L (ref 3.5–5.1)
Sodium: 134 mmol/L — ABNORMAL LOW (ref 135–145)

## 2019-01-31 MED ORDER — TRAMADOL HCL 50 MG PO TABS
50.0000 mg | ORAL_TABLET | Freq: Four times a day (QID) | ORAL | Status: DC | PRN
  Administered 2019-01-31 – 2019-02-01 (×2): 50 mg via ORAL
  Filled 2019-01-31 (×2): qty 1

## 2019-01-31 MED ORDER — LIVING WELL WITH DIABETES BOOK
Freq: Once | Status: AC
  Administered 2019-01-31: 18:00:00
  Filled 2019-01-31: qty 1

## 2019-01-31 NOTE — Progress Notes (Addendum)
[place] [facility] [place] NAME: Michael Gill    MR#:  [id number]  DATE OF BIRTH:  1961/04/13  SUBJECTIVE:  CHIEF COMPLAINT:   Chief Complaint  Patient presents with  . Abdominal Pain  . Urinary Retention  Patient seen and evaluated today Decreased abdominal pain No fever No nausea or vomiting No chest pain  REVIEW OF SYSTEMS:    ROS  CONSTITUTIONAL: No documented fever. Has fatigue, weakness. No weight gain, no weight loss.  EYES: No blurry or double vision.  ENT: No tinnitus. No postnasal drip. No redness of the oropharynx.  RESPIRATORY: No cough, no wheeze, no hemoptysis. No dyspnea.  CARDIOVASCULAR: No chest pain. No orthopnea. No palpitations. No syncope.  GASTROINTESTINAL: No nausea, no vomiting or diarrhea. Decreased abdominal pain. No melena or hematochezia.  GENITOURINARY: Has dysuria  no hematuria.  ENDOCRINE: No polyuria or nocturia. No heat or cold intolerance.  HEMATOLOGY: No anemia. No bruising. No bleeding.  INTEGUMENTARY: No rashes. No lesions.  MUSCULOSKELETAL: No arthritis. No swelling. No gout.  NEUROLOGIC: No numbness, tingling, or ataxia. No seizure-type activity.  PSYCHIATRIC: No anxiety. No insomnia. No ADD.   DRUG ALLERGIES:   Allergies  Allergen Reactions  . Shellfish Allergy Hives    Swelling of throat and mouth. Betadine is NOT a problem  . Codeine Other (See Comments)    Reaction:  Hallucinations   . Sulfa Antibiotics Hives    VITALS:  Blood pressure 114/62, pulse 88, temperature (!) 97.3 F (36.3 C), resp. rate 20, height 5\' 8"  (1.727 m), weight 117.9 kg, SpO2 92 %.  PHYSICAL EXAMINATION:   Physical Exam  GENERAL:  58 y.o.-year-old patient lying in the bed with no acute distress.  EYES: Pupils equal, round, reactive to light and accommodation. No scleral icterus. Extraocular muscles intact.  HEENT: Head atraumatic, normocephalic. Oropharynx and nasopharynx clear.  NECK:  Supple, no jugular  venous distention. No thyroid enlargement, no tenderness.  LUNGS: Normal breath sounds bilaterally, no wheezing, rales, rhonchi. No use of accessory muscles of respiration.  CARDIOVASCULAR: S1, S2 normal. No murmurs, rubs, or gallops.  ABDOMEN: Soft, decreased tenderness around the umbilicus, nondistended. Bowel sounds present. No organomegaly or mass.  EXTREMITIES: No cyanosis, clubbing or edema b/l.    NEUROLOGIC: Cranial nerves II through XII are intact. No focal Motor or sensory deficits b/l.   PSYCHIATRIC: The patient is alert and oriented x 3.  SKIN: No obvious rash, lesion, or ulcer.   LABORATORY PANEL:   CBC Recent Labs  Lab 01/31/19 0619  WBC 12.3*  HGB 12.7*  HCT 38.1*  PLT 130*   ------------------------------------------------------------------------------------------------------------------ Chemistries  Recent Labs  Lab 01/30/19 0012  01/31/19 0619  NA 134*   < > 134*  K 4.5   < > 3.7  CL 101   < > 108  CO2 23   < > 20*  GLUCOSE 244*   < > 209*  BUN 20   < > 23*  CREATININE 1.48*   < > 1.16  CALCIUM 8.4*   < > 7.5*  AST 17  --   --   ALT 20  --   --   ALKPHOS 52  --   --   BILITOT 2.6*  --   --    < > = values in this interval not displayed.   ------------------------------------------------------------------------------------------------------------------  Cardiac Enzymes No results for input(s): TROPONINI in the last 168 hours. ------------------------------------------------------------------------------------------------------------------  RADIOLOGY:  Ct Renal Stone Study  Result  Date: 01/30/2019 CLINICAL DATA:  58 y/o M; Flank pain, stone disease suspected acute urinary retention and acute kidney injury, hematuria, probable UTI vs pyelonephritis, prostatitis also very likely. EXAM: CT ABDOMEN AND PELVIS WITHOUT CONTRAST TECHNIQUE: Multidetector CT imaging of the abdomen and pelvis was performed following the standard protocol without IV contrast.  COMPARISON:  10/15/2016 CT abdomen and pelvis. FINDINGS: Lower chest: No acute abnormality. Hepatobiliary: No focal liver abnormality is seen. No gallstones, gallbladder wall thickening, or biliary dilatation. Pancreas: Unremarkable. No pancreatic ductal dilatation or surrounding inflammatory changes. Spleen: Normal in size without focal abnormality. Adrenals/Urinary Tract: Adrenal glands are unremarkable. Kidneys are normal, without renal calculi, focal lesion, or hydronephrosis. Bladder is collapsed around a Foley catheter. Stomach/Bowel: Stomach is within normal limits. Appendix not identified. Wall thickening of the anterior rectum at the level of prostate. Otherwise no evidence of bowel wall thickening, distention, or inflammatory changes. Mild sigmoid diverticulosis. Vascular/Lymphatic: Aortic atherosclerosis. No enlarged abdominal or pelvic lymph nodes. Reproductive: Prostate is enlarged and low in attenuation and there is surrounding edema within the pelvis as well as in the adjacent anterior wall of the rectum. No discrete fluid collection is identified. Other: Small paraumbilical hernia containing fat. Musculoskeletal: No fracture is seen. Calcified L4-5 disc bulge encroaching on the neural foramen and resulting in at least mild spinal canal stenosis. IMPRESSION: 1. Enlarged hypodense prostate with surrounding edema in the pelvis as well as wall thickening of adjacent anterior rectum. Findings likely represent acute prostatitis. No discrete abscess identified. 2.  Aortic Atherosclerosis (ICD10-I70.0). 3. Small paraumbilical hernia containing fat. 4. Calcified L4-5 disc bulge encroaching on the neural foramen and resulting in at least mild spinal canal stenosis. Electronically Signed   By: Kristine Garbe M.D.   On: 01/30/2019 02:44     ASSESSMENT AND PLAN:   58 year old male patient with history of vitamin B12 deficiency CVA bronchial asthma, colon cancer, coronary artery disease, GERD,  hyperlipidemia presented to the hospital service infection  -Sepsis improving With IV antibiotics IV fluid hydration to continue Follow-up cultures Continue antibiotics  -Acute prostatitis resolving Continue IV ciprofloxacin antibiotic and follow-up cultures Will need prolonged therapy of antibiotic for 3 weeks  -Acute E. coli UTI Follow-up culture sensitivities Continue IV ciprofloxacin antibiotic  -Hyperlipidemia Continue statin therapy  -Peripheral neuropathy Continue Neurontin  -Type 2 diabetes mellitus Diabetic diet with sliding scale coverage with insulin along with Lantus insulin  -DVT prophylaxis subcu Lovenox daily  -Ambulatory dysfunction Physical therapy evaluation  All the records are reviewed and case discussed with Care Management/Social Worker. Management plans discussed with the patient, family and they are in agreement.  CODE STATUS: Full code  DVT Prophylaxis: SCDs  TOTAL TIME TAKING CARE OF THIS PATIENT: 38 minutes.   POSSIBLE D/C IN 2 to 3 DAYS, DEPENDING ON CLINICAL CONDITION.  Saundra Shelling M.D on 01/31/2019 at 12:04 PM  Between 7am to 6pm - Pager - [phone]  After 6pm go to [email or web address] - password [email or web address] [facility]  Office  [phone]  CC: Primary care physician; Letta Median, MD  Note: This dictation was prepared with Dragon dictation along with smaller phrase technology. Any transcriptional errors that result from this process are unintentional.

## 2019-01-31 NOTE — Progress Notes (Signed)
Inpatient Diabetes Program Recommendations  AACE/ADA: New Consensus Statement on Inpatient Glycemic Control (2015)  Target Ranges:  Prepandial:   less than 140 mg/dL      Peak postprandial:   less than 180 mg/dL (1-2 hours)      Critically ill patients:  140 - 180 mg/dL   Lab Results  Component Value Date   GLUCAP 172 (H) 01/31/2019   HGBA1C 8.4 (H) 01/30/2019    Review of Glycemic Control Results for Michael Gill, Michael Gill (MRN [id number]) as of 01/31/2019 12:02  Ref. Range 01/30/2019 15:22 01/30/2019 16:23 01/30/2019 22:26 01/31/2019 07:37 01/31/2019 11:39  Glucose-Capillary Latest Ref Range: 70 - 99 mg/dL 285 (H) 297 (H) 229 (H) 162 (H) 172 (H)   Diabetes history: DM2 Outpatient Diabetes medications:  Lantus 36 units + Novolog 10 units tid meal coverage Current orders for Inpatient glycemic control: Lantus 36 units + Novolog 10 units tid meal coverage if eats 50% + Novolog sensitive correction tid + hs 0-5 units  Inpatient Diabetes Program Recommendations:   Received consult regarding elevated CBGs. Agree with current orders. Ordered Living Well with Diabetes for patient to review.  Thank you, Nani Gasser. Massai Hankerson, RN, MSN, CDE  Diabetes Coordinator Inpatient Glycemic Control Team Team Pager [phone] (8am-5pm) 01/31/2019 12:08 PM

## 2019-01-31 NOTE — Evaluation (Signed)
Physical Therapy Evaluation Patient Details Name: Michael Gill MRN: [id number] DOB: [birth year] Today's Date: 01/31/2019   History of Present Illness  presented to ER secondary to abdominal pain, urinary retention; admitted for management of sepsis secondary to UTI, acute prostatitis.  Clinical Impression  Patient sleeping soundly upon arrival, but easily awakens to voice and light touch.  Agreeable to session, pleasant and cooperative.  Strength and ROM assessment reveal mild weakness (increased effort) with L UE < LE movement; unchanged with this admission.  Currently able to complete bed mobility with min assist; sit/stand, basic transfers and gait (50') with RW, sup/mod indep.  Demonstrates reciprocal stepping with fair step height/length, decreased cadence with mild decrease in L LE stance time; however, steady without buckling, LOB or overt safety concern.  PAtient reports performance is baseline for him; feels comfortable/confident with performance. Appears to be at baseline level of functional ability; no acute PT needs identified at this time.  Will complete initial order at this time.  Please re-consult should needs change.    Educated/encouraged for OOB/up to chair for meals and gait x2-3/day with RW and staff assist to maintain strength/endurance throughout remaining hospitalization. PAtient voiced understanding/awareness    Follow Up Recommendations No PT follow up    Equipment Recommendations       Recommendations for Other Services       Precautions / Restrictions Precautions Precautions: Fall Restrictions Weight Bearing Restrictions: No      Mobility  Bed Mobility Overal bed mobility: Needs Assistance Bed Mobility: Supine to Sit;Sit to Supine     Supine to sit: Min assist Sit to supine: Min assist      Transfers Overall transfer level: Needs assistance Equipment used: Rolling walker (2 wheeled) Transfers: Sit to/from Stand Sit to Stand: Supervision;Modified  independent (Device/Increase time)            Ambulation/Gait Ambulation/Gait assistance: Supervision;Modified independent (Device/Increase time) Gait Distance (Feet): 50 Feet Assistive device: Rolling walker (2 wheeled)       General Gait Details: reciprocal stepping with fair step height/length, decreased cadence with mild decrease in L LE stance time; however, steady without buckling, LOB or overt safety concern.  PAtient reports performance is baseline for him; feels comfortable/confident with performance.  Stairs            Wheelchair Mobility    Modified Rankin (Stroke Patients Only)       Balance Overall balance assessment: Needs assistance Sitting-balance support: No upper extremity supported;Feet supported Sitting balance-Leahy Scale: Good     Standing balance support: Bilateral upper extremity supported Standing balance-Leahy Scale: Fair                               Pertinent Vitals/Pain Pain Assessment: Faces Faces Pain Scale: Hurts little more Pain Location: lower abdomen Pain Descriptors / Indicators: Grimacing Pain Intervention(s): Limited activity within patient's tolerance;Monitored during session;Repositioned    Home Living Family/patient expects to be discharged to:: Private residence Living Arrangements: Spouse/significant other Available Help at Discharge: Family Type of Home: House Home Access: Ramped entrance     Home Layout: One level Home Equipment: Environmental consultant - 2 wheels      Prior Function Level of Independence: Independent with assistive device(s)         Comments: Mod indep with RW for ADLs, household and community mobilization; endorses single fall in previous three months (got feet tangled up)     Hand Dominance  Dominant Hand: Right    Extremity/Trunk Assessment   Upper Extremity Assessment Upper Extremity Assessment: Overall WFL for tasks assessed    Lower Extremity Assessment Lower Extremity  Assessment: Overall WFL for tasks assessed(grossly at least 4-/5; increased time for activation of L LE at  times)       Communication   Communication: No difficulties  Cognition Arousal/Alertness: Awake/alert Behavior During Therapy: WFL for tasks assessed/performed Overall Cognitive Status: Within Functional Limits for tasks assessed                                        General Comments      Exercises Other Exercises Other Exercises: Educated/encouraged for OOB/up to chair for meals and gait x2-3/day with RW and staff assist to maintain strength/endurance throughout remaining hospitalization. PAtient voiced understanding/awareness.   Assessment/Plan    PT Assessment Patent does not need any further PT services  PT Problem List         PT Treatment Interventions      PT Goals (Current goals can be found in the Care Plan section)  Acute Rehab PT Goals Patient Stated Goal: to return home when ready, to keep my strength up PT Goal Formulation: All assessment and education complete, DC therapy Time For Goal Achievement: 01/31/19 Potential to Achieve Goals: Good    Frequency     Barriers to discharge        Co-evaluation               AM-PAC PT "6 Clicks" Mobility  Outcome Measure Help needed turning from your back to your side while in a flat bed without using bedrails?: None Help needed moving from lying on your back to sitting on the side of a flat bed without using bedrails?: A Little Help needed moving to and from a bed to a chair (including a wheelchair)?: None Help needed standing up from a chair using your arms (e.g., wheelchair or bedside chair)?: None Help needed to walk in hospital room?: None Help needed climbing 3-5 steps with a railing? : A Little 6 Click Score: 22    End of Session Equipment Utilized During Treatment: Gait belt Activity Tolerance: Patient tolerated treatment well Patient left: in bed;with bed alarm set;with  call bell/phone within reach Nurse Communication: Mobility status PT Visit Diagnosis: Muscle weakness (generalized) (M62.81);Difficulty in walking, not elsewhere classified (R26.2)    Time: 6759-1638 PT Time Calculation (min) (ACUTE ONLY): 18 min   Charges:   PT Evaluation $PT Eval Low Complexity: 1 Low         Jolanta Cabeza H. Owens Shark, PT, DPT, NCS 01/31/19, 3:14 PM [phone]

## 2019-02-01 DIAGNOSIS — N410 Acute prostatitis: Secondary | ICD-10-CM | POA: Diagnosis not present

## 2019-02-01 DIAGNOSIS — A4151 Sepsis due to Escherichia coli [E. coli]: Secondary | ICD-10-CM | POA: Diagnosis not present

## 2019-02-01 LAB — URINE CULTURE
Culture: >=100000 — AB
Special Requests: NORMAL

## 2019-02-01 LAB — HIV ANTIBODY (ROUTINE TESTING W REFLEX): HIV Screen 4th Generation wRfx: NONREACTIVE

## 2019-02-01 LAB — GLUCOSE, CAPILLARY: Glucose-Capillary: 190 mg/dL — ABNORMAL HIGH (ref 70–99)

## 2019-02-01 MED ORDER — CIPROFLOXACIN HCL 500 MG PO TABS: 500.0000 mg | ORAL_TABLET | Freq: Once | ORAL | Status: DC

## 2019-02-01 MED ORDER — CIPROFLOXACIN HCL 500 MG PO TABS: 500.0000 mg | ORAL_TABLET | Freq: Two times a day (BID) | ORAL | 0 refills | Status: DC

## 2019-02-01 MED ORDER — CIPROFLOXACIN HCL 500 MG PO TABS: 500.0000 mg | ORAL_TABLET | Freq: Two times a day (BID) | ORAL | 0 refills | Status: AC

## 2019-02-01 NOTE — Discharge Summary (Addendum)
[facility] NAME: Michael Gill    MR#:  [id number]  DATE OF BIRTH:  Apr 08, 1961  DATE OF ADMISSION:  01/30/2019 ADMITTING PHYSICIAN: Christel Mormon, MD  DATE OF DISCHARGE: 02/01/2019  PRIMARY CARE PHYSICIAN: Letta Median, MD   ADMISSION DIAGNOSIS:  Acute prostatitis with hematuria [N41.0] Acute kidney injury ([place]) [N17.9] Acute urinary retention [R33.8] Urinary tract infection with hematuria, site unspecified [N39.0, R31.9] Sepsis, due to unspecified organism, unspecified whether acute organ dysfunction present ([place]) [A41.9]  DISCHARGE DIAGNOSIS:  Active Problems:   Sepsis ([place]) Acute urinary tract infection Acute kidney injury Dehydration E. coli urinary tract infection Acute prostatitis Spinal stenosis SECONDARY DIAGNOSIS:   Past Medical History:  Diagnosis Date  . Asthma   . B12 deficiency    in the past  . CAD (coronary artery disease)    s/p MI  . Colon cancer ([place]) 1990  . Complication of anesthesia    long time to wake up from colonoscopy.  . Conversion disorder    difficulty with visual disturbances and changes with unknown etiology, along with headaches  . CVA (cerebral infarction)   . 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 DVT (deep venous thrombosis) ([place]) 2016   behind right knee  . Dyspnea 09/2018   with exercise  . Dysrhythmia    has had Afib in past.  . GERD (gastroesophageal reflux disease)   . History of kidney stones 1990  . Hx of therapeutic radiation   . Hyperlipidemia   . Myocardial infarction ([place]) 1990  . Neuropathic pain, leg   . 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  . Seizure ([place])    last episode 08/2017. has no warning, will just happen. nothing specific precipitates event  . Sleep apnea    does not use cpap  . 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 Stroke [facility]) 2012, 03/2018   residual tremor left hand, facial droop  . Tremors of nervous system    specifically left hand, since stroke     ADMITTING HISTORY Michael Gill  is a 58 y.o. male with a known history of multiple medical problems that are mentioned below including asthma, [name], vitamin B12 deficiency and CVA, who presented to the emergency room acute onset of difficulty with urination with significant dysuria and what he describes as excruciating pain that started on Wednesday at 3 AM.  He had associated lower abdominal pain.  He denied any fever or chills.  No nausea or vomiting.  No cough or wheezing or shortness of breath.  No chest pain or palpitations.  No recent sick exposures. Upon presentation to the emergency room, heart rate was 121 with otherwise normal vital signs and temperature was 98.7.  Labs revealed leukocytosis of 14.9 and BUN was 20 with creatinine of 1.23.  Lactic acid was 1.8 and repeat level was similar.  UA was strongly positive for UTI.  Novel coronavirus sent out lab is pending.  Abdominal pelvic CT scan revealed enlarged hypodense prostate with surrounding edema as well as thickening of the adjacent anterior rectum with findings likely representing acute prostatitis.  There was aortic atherosclerosis and small periumbilical hernia containing fat as well as calcified L4-5 disc bulge with mild spinal canal stenosis. The patient was given IV Levaquin, 4 mg IV  morphine sulfate and 4 mg of IV Zofran as well as 5 mg of p.o. Ditropan with IV normal saline boluses.  He will be admitted to the medical monitored bed for further evaluation and management.  HOSPITAL COURSE:  Admitted to medical floor was worked up with CT abdomen which showed enlarged prostate with edema and thickening representing prostatitis.  Patient was started on IV ciprofloxacin antibiotic received IV fluids for sepsis and cultures were done.  Lactic acid was followed.  Patient  leukocytosis resolved.  His fever resolved.  Urinary tract infection also improved.  Patient's renal function improved with IV fluids and dehydration resolved and acute kidney injury improved.  Patient will be discharged home on oral ciprofloxacin antibiotic.  Patient will be discharged on oral ciprofloxacin antibiotic for 3 weeks for acute prostatitis.  This is also take care of E. coli urinary tract infection.  Will give outpatient follow-up appointment with urology.  CONSULTS OBTAINED:  None  DRUG ALLERGIES:   Allergies  Allergen Reactions  . Shellfish Allergy Hives    Swelling of throat and mouth. Betadine is NOT a problem  . Codeine Other (See Comments)    Reaction:  Hallucinations   . Sulfa Antibiotics Hives    DISCHARGE MEDICATIONS:   Allergies as of 02/01/2019      Reactions   Shellfish Allergy Hives   Swelling of throat and mouth. Betadine is NOT a problem   Codeine Other (See Comments)   Reaction:  Hallucinations    Sulfa Antibiotics Hives      Medication List    TAKE these medications   acetaminophen 500 MG tablet Commonly known as: TYLENOL Take 500 mg by mouth every 6 (six) hours as needed for moderate pain.   albuterol 108 (90 Base) MCG/ACT inhaler Commonly known as: VENTOLIN HFA Inhale 1-2 puffs into the lungs every 4 (four) hours as needed for wheezing or shortness of breath.   aspirin EC 81 MG tablet Take 81 mg by mouth every morning.   atorvastatin 10 MG tablet Commonly known as: LIPITOR Take 10 mg by mouth daily.   bismuth subsalicylate 678 MG chewable tablet Commonly known as: PEPTO BISMOL Chew 524 mg by mouth as needed.   cetirizine 10 MG tablet Commonly known as: ZYRTEC Take 10 mg by mouth daily.   ciprofloxacin 500 MG tablet Commonly known as: Cipro Take 1 tablet (500 mg total) by mouth 2 (two) times daily for 21 days.   fluticasone 50 MCG/ACT nasal spray Commonly known as: FLONASE Place 1 spray into both nostrils daily as needed for  allergies or rhinitis.   gabapentin 100 MG capsule Commonly known as: NEURONTIN Take 100 mg by mouth 3 (three) times daily.   ibuprofen 600 MG tablet Commonly known as: ADVIL Take 600 mg by mouth every 8 (eight) hours as needed (PAIN).   insulin aspart 100 UNIT/ML injection Commonly known as: novoLOG Inject 10 Units into the skin 3 (three) times daily with meals.   insulin glargine 100 UNIT/ML injection Commonly known as: LANTUS Inject 36 Units into the skin daily.   meclizine 25 MG tablet Commonly known as: ANTIVERT Take 25 mg by mouth 3 (three) times daily as needed for dizziness.   omeprazole 40 MG capsule Commonly known as: PRILOSEC Take 40 mg by mouth daily.       Today  Patient seen and evaluated today Physical therapy evaluated patient and recommended no additional services Hemodynamically stable Tolerating diet VITAL SIGNS:  Blood pressure (!) 164/93, pulse 94, temperature  98.8 F (37.1 C), resp. rate 20, height 5\' 8"  (1.727 m), weight 117.9 kg, SpO2 95 %.  I/O:    Intake/Output Summary (Last 24 hours) at 02/01/2019 1013 Last data filed at 02/01/2019 1008 Gross per 24 hour  Intake 1822.75 ml  Output 2725 ml  Net -902.25 ml    PHYSICAL EXAMINATION:  Physical Exam  GENERAL:  58 y.o.-year-old patient lying in the bed with no acute distress.  LUNGS: Normal breath sounds bilaterally, no wheezing, rales,rhonchi or crepitation. No use of accessory muscles of respiration.  CARDIOVASCULAR: S1, S2 normal. No murmurs, rubs, or gallops.  ABDOMEN: Soft, non-tender, non-distended. Bowel sounds present. No organomegaly or mass.  NEUROLOGIC: Moves all 4 extremities. PSYCHIATRIC: The patient is alert and oriented x 3.  SKIN: No obvious rash, lesion, or ulcer.   DATA REVIEW:   CBC Recent Labs  Lab 01/31/19 0619  WBC 12.3*  HGB 12.7*  HCT 38.1*  PLT 130*    Chemistries  Recent Labs  Lab 01/30/19 0012  01/31/19 0619  NA 134*   < > 134*  K 4.5   < > 3.7   CL 101   < > 108  CO2 23   < > 20*  GLUCOSE 244*   < > 209*  BUN 20   < > 23*  CREATININE 1.48*   < > 1.16  CALCIUM 8.4*   < > 7.5*  AST 17  --   --   ALT 20  --   --   ALKPHOS 52  --   --   BILITOT 2.6*  --   --    < > = values in this interval not displayed.    Cardiac Enzymes No results for input(s): TROPONINI in the last 168 hours.  Microbiology Results  Results for orders placed or performed during the hospital encounter of 01/30/19  Urine Culture     Status: Abnormal   Collection Time: 01/30/19  1:12 AM   Specimen: Urine, Random  Result Value Ref Range Status   Specimen Description   Final    URINE, RANDOM Performed at [facility], [address] [place], [place] [address]    Special Requests   Final    Normal Performed at [facility], [place], [place] [address]    Culture >=100,000 COLONIES/mL ESCHERICHIA COLI (A)  Final   Report Status 02/01/2019 FINAL  Final   Organism ID, Bacteria ESCHERICHIA COLI (A)  Final      Susceptibility   Escherichia coli - MIC*    AMPICILLIN <=2 SENSITIVE Sensitive     CEFAZOLIN <=4 SENSITIVE Sensitive     CEFTRIAXONE <=1 SENSITIVE Sensitive     CIPROFLOXACIN <=0.25 SENSITIVE Sensitive     GENTAMICIN <=1 SENSITIVE Sensitive     IMIPENEM <=0.25 SENSITIVE Sensitive     NITROFURANTOIN <=16 SENSITIVE Sensitive     TRIMETH/SULFA <=20 SENSITIVE Sensitive     AMPICILLIN/SULBACTAM <=2 SENSITIVE Sensitive     PIP/TAZO <=4 SENSITIVE Sensitive     Extended ESBL NEGATIVE Sensitive     * >=100,000 COLONIES/mL ESCHERICHIA COLI  Novel Coronavirus,NAA,(SEND-OUT TO REF LAB - TAT 24-48 hrs); Hosp Order     Status: None   Collection Time: 01/30/19  3:59 AM   Specimen: Nasopharyngeal Swab; Respiratory  Result Value Ref Range Status   SARS-CoV-2, NAA NOT DETECTED NOT DETECTED Final    Comment: (NOTE) This test was developed and its performance characteristics determined by [employer]. This test has  not been FDA cleared or approved. This test has been authorized by FDA under an Emergency Use Authorization (EUA). This test is only authorized for the duration of time the declaration that circumstances exist justifying the authorization of the emergency use of in vitro diagnostic tests for detection of SARS-CoV-2 virus and/or diagnosis of COVID-19 infection under section 564(b)(1) of the Act, 21 U.S.C. 517OHY-0(V)(3), unless the authorization is terminated or revoked sooner. When diagnostic testing is negative, the possibility of a false negative result should be considered in the context of a patient's recent exposures and the presence of clinical signs and symptoms consistent with COVID-19. An individual without symptoms of COVID-19 and who is not shedding SARS-CoV-2 virus would expect to have a negative (not detected) result in this assay. Performed  At: [facility] [address] [place], [place] [id number] Rush Farmer MD NI:[id number]    [place]  Final    Comment: Performed at [facility], [address] [place], [place] [address]  Blood Culture (routine x 2)     Status: None (Preliminary result)   Collection Time: 01/30/19  4:00 AM   Specimen: BLOOD  Result Value Ref Range Status   Specimen Description BLOOD BLOOD LEFT FOREARM  Final   Special Requests   Final    BOTTLES DRAWN AEROBIC AND ANAEROBIC Blood Culture results may not be optimal due to an excessive volume of blood received in culture bottles   Culture   Final    NO GROWTH 2 DAYS Performed at [facility], [address], [place], [place] [address]    Report Status PENDING  Incomplete  Blood Culture (routine x 2)     Status: None (Preliminary result)   Collection Time: 01/30/19  4:00 AM   Specimen: BLOOD  Result Value Ref Range Status   Specimen Description BLOOD BLOOD LEFT ARM  Final   Special Requests   Final    BOTTLES DRAWN AEROBIC AND ANAEROBIC  Blood Culture adequate volume   Culture   Final    NO GROWTH 2 DAYS Performed at [facility], [address] [place], [place] [address]    Report Status PENDING  Incomplete    RADIOLOGY:  No results found.  Follow up with PCP in 1 week.  Management plans discussed with the patient, family and they are in agreement.  CODE STATUS: Full code    Code Status Orders  (From admission, onward)         Start     Ordered   01/30/19 0414  Full code  Continuous     01/30/19 0413        Code Status History    Date Active Date Inactive Code Status Order ID Comments User Context   06/05/2016 2026 06/06/2016 1741 Full Code [id number]  Dustin Flock, MD Inpatient   Advance Care Planning Activity    Advance Directive Documentation     Most Recent Value  Type of Advance Directive  Healthcare Power of Attorney, Living will  Pre-existing out of facility DNR order (yellow form or pink MOST form)  -  "MOST" Form in Place?  -      TOTAL TIME TAKING CARE OF THIS PATIENT ON DAY OF DISCHARGE: more than 35 minutes.   Saundra Shelling M.D on 02/01/2019 at 10:13 AM  Between 7am to 6pm - Pager - [phone]  After 6pm go to [email or web address] - password [email or web address] [facility]  Office  [phone]  CC: Primary care  physician; Letta Median, MD  Note: This dictation was prepared with Dragon dictation along with smaller phrase technology. Any transcriptional errors that result from this process are unintentional.

## 2019-02-04 LAB — CULTURE, BLOOD (ROUTINE X 2)
Culture: NO GROWTH
Culture: NO GROWTH
Special Requests: ADEQUATE

## 2019-02-25 ENCOUNTER — Other Ambulatory Visit: Payer: Self-pay

## 2019-02-25 ENCOUNTER — Emergency Department: Payer: Medicare HMO

## 2019-02-25 ENCOUNTER — Inpatient Hospital Stay
Admission: AD | Admit: 2019-02-25 | Payer: Medicare HMO | Source: Other Acute Inpatient Hospital | Admitting: Family Medicine

## 2019-02-25 ENCOUNTER — Inpatient Hospital Stay
Admission: EM | Admit: 2019-02-25 | Discharge: 2019-02-27 | DRG: 177 | Disposition: A | Discharge status: Home / Self Care | Payer: Medicare HMO | Attending: Internal Medicine | Admitting: Internal Medicine

## 2019-02-25 ENCOUNTER — Encounter: Payer: Self-pay | Admitting: Emergency Medicine

## 2019-02-25 DIAGNOSIS — D72820 Lymphocytosis (symptomatic): Secondary | ICD-10-CM | POA: Diagnosis present

## 2019-02-25 DIAGNOSIS — G4733 Obstructive sleep apnea (adult) (pediatric): Secondary | ICD-10-CM | POA: Diagnosis present

## 2019-02-25 DIAGNOSIS — U071 COVID-19: Secondary | ICD-10-CM

## 2019-02-25 DIAGNOSIS — Z79899 Other long term (current) drug therapy: Secondary | ICD-10-CM

## 2019-02-25 DIAGNOSIS — H539 Unspecified visual disturbance: Secondary | ICD-10-CM | POA: Diagnosis present

## 2019-02-25 DIAGNOSIS — E538 Deficiency of other specified B group vitamins: Secondary | ICD-10-CM | POA: Diagnosis present

## 2019-02-25 DIAGNOSIS — J1289 Other viral pneumonia: Secondary | ICD-10-CM | POA: Diagnosis present

## 2019-02-25 DIAGNOSIS — I2510 Atherosclerotic heart disease of native coronary artery without angina pectoris: Secondary | ICD-10-CM | POA: Diagnosis present

## 2019-02-25 DIAGNOSIS — J45909 Unspecified asthma, uncomplicated: Secondary | ICD-10-CM | POA: Diagnosis present

## 2019-02-25 DIAGNOSIS — I2694 Multiple subsegmental pulmonary emboli without acute cor pulmonale: Secondary | ICD-10-CM | POA: Diagnosis present

## 2019-02-25 DIAGNOSIS — Z885 Allergy status to narcotic agent status: Secondary | ICD-10-CM

## 2019-02-25 DIAGNOSIS — R251 Tremor, unspecified: Secondary | ICD-10-CM | POA: Diagnosis present

## 2019-02-25 DIAGNOSIS — Z7982 Long term (current) use of aspirin: Secondary | ICD-10-CM

## 2019-02-25 DIAGNOSIS — Z882 Allergy status to sulfonamides status: Secondary | ICD-10-CM

## 2019-02-25 DIAGNOSIS — I2609 Other pulmonary embolism with acute cor pulmonale: Secondary | ICD-10-CM

## 2019-02-25 DIAGNOSIS — I252 Old myocardial infarction: Secondary | ICD-10-CM

## 2019-02-25 DIAGNOSIS — I2699 Other pulmonary embolism without acute cor pulmonale: Secondary | ICD-10-CM | POA: Diagnosis present

## 2019-02-25 DIAGNOSIS — I69992 Facial weakness following unspecified cerebrovascular disease: Secondary | ICD-10-CM | POA: Diagnosis not present

## 2019-02-25 DIAGNOSIS — Z85038 Personal history of other malignant neoplasm of large intestine: Secondary | ICD-10-CM

## 2019-02-25 DIAGNOSIS — Z794 Long term (current) use of insulin: Secondary | ICD-10-CM | POA: Diagnosis not present

## 2019-02-25 DIAGNOSIS — R0603 Acute respiratory distress: Secondary | ICD-10-CM | POA: Diagnosis present

## 2019-02-25 DIAGNOSIS — Z91013 Allergy to seafood: Secondary | ICD-10-CM

## 2019-02-25 DIAGNOSIS — F445 Conversion disorder with seizures or convulsions: Secondary | ICD-10-CM | POA: Diagnosis present

## 2019-02-25 DIAGNOSIS — E1140 Type 2 diabetes mellitus with diabetic neuropathy, unspecified: Secondary | ICD-10-CM | POA: Diagnosis present

## 2019-02-25 DIAGNOSIS — R0902 Hypoxemia: Secondary | ICD-10-CM | POA: Diagnosis present

## 2019-02-25 DIAGNOSIS — K219 Gastro-esophageal reflux disease without esophagitis: Secondary | ICD-10-CM | POA: Diagnosis present

## 2019-02-25 DIAGNOSIS — Z86718 Personal history of other venous thrombosis and embolism: Secondary | ICD-10-CM

## 2019-02-25 DIAGNOSIS — E785 Hyperlipidemia, unspecified: Secondary | ICD-10-CM | POA: Diagnosis present

## 2019-02-25 DIAGNOSIS — Z820 Family history of epilepsy and other diseases of the nervous system: Secondary | ICD-10-CM

## 2019-02-25 LAB — APTT: aPTT: 29 seconds (ref 24–36)

## 2019-02-25 LAB — HEPATIC FUNCTION PANEL
ALT: 19 U/L (ref 0–44)
AST: 16 U/L (ref 15–41)
Albumin: 3.4 g/dL — ABNORMAL LOW (ref 3.5–5.0)
Alkaline Phosphatase: 67 U/L (ref 38–126)
Bilirubin, Direct: 0.1 mg/dL (ref 0.0–0.2)
Indirect Bilirubin: 0.8 mg/dL (ref 0.3–0.9)
Total Bilirubin: 0.9 mg/dL (ref 0.3–1.2)
Total Protein: 6.6 g/dL (ref 6.5–8.1)

## 2019-02-25 LAB — BASIC METABOLIC PANEL
Anion gap: 8 (ref 5–15)
BUN: 11 mg/dL (ref 6–20)
CO2: 23 mmol/L (ref 22–32)
Calcium: 8.3 mg/dL — ABNORMAL LOW (ref 8.9–10.3)
Chloride: 106 mmol/L (ref 98–111)
Creatinine, Ser: 0.89 mg/dL (ref 0.61–1.24)
GFR calc Af Amer: >60 mL/min (ref >60)
GFR calc non Af Amer: >60 mL/min (ref >60)
Glucose, Bld: 125 mg/dL — ABNORMAL HIGH (ref 70–99)
Potassium: 3.9 mmol/L (ref 3.5–5.1)
Sodium: 137 mmol/L (ref 135–145)

## 2019-02-25 LAB — PROCALCITONIN: Procalcitonin: <0.1 ng/mL

## 2019-02-25 LAB — CBC
HCT: 42.3 % (ref 39.0–52.0)
Hemoglobin: 14.4 g/dL (ref 13.0–17.0)
MCH: 30.5 pg (ref 26.0–34.0)
MCHC: 34 g/dL (ref 30.0–36.0)
MCV: 89.6 fL (ref 80.0–100.0)
Platelets: 157 10*3/uL (ref 150–400)
RBC: 4.72 MIL/uL (ref 4.22–5.81)
RDW: 12.9 % (ref 11.5–15.5)
WBC: 6.4 10*3/uL (ref 4.0–10.5)
nRBC: 0 % (ref 0.0–0.2)

## 2019-02-25 LAB — FERRITIN: Ferritin: 406 ng/mL — ABNORMAL HIGH (ref 24–336)

## 2019-02-25 LAB — C-REACTIVE PROTEIN: CRP: 2.5 mg/dL — ABNORMAL HIGH (ref <1.0)

## 2019-02-25 LAB — PROTIME-INR
INR: 1 (ref 0.8–1.2)
Prothrombin Time: 12.7 seconds (ref 11.4–15.2)

## 2019-02-25 LAB — FIBRIN DERIVATIVES D-DIMER (ARMC ONLY): Fibrin derivatives D-dimer (ARMC): 1389.33 ng/mL (FEU) — ABNORMAL HIGH (ref 0.00–499.00)

## 2019-02-25 LAB — LACTIC ACID, PLASMA: Lactic Acid, Venous: 0.9 mmol/L (ref 0.5–1.9)

## 2019-02-25 LAB — SARS CORONAVIRUS 2 BY RT PCR (HOSPITAL ORDER, PERFORMED IN ~~LOC~~ HOSPITAL LAB): SARS Coronavirus 2: POSITIVE — AB

## 2019-02-25 LAB — TROPONIN I (HIGH SENSITIVITY)
Troponin I (High Sensitivity): 3 ng/L (ref <18)
Troponin I (High Sensitivity): 4 ng/L (ref <18)

## 2019-02-25 LAB — GLUCOSE, CAPILLARY: Glucose-Capillary: 260 mg/dL — ABNORMAL HIGH (ref 70–99)

## 2019-02-25 LAB — LACTATE DEHYDROGENASE: LDH: 149 U/L (ref 98–192)

## 2019-02-25 MED ORDER — HYDROMORPHONE HCL 1 MG/ML PO LIQD: 1.0000 mg | Freq: Once | ORAL | Status: DC

## 2019-02-25 MED ORDER — DEXAMETHASONE 4 MG PO TABS
8.0000 mg | ORAL_TABLET | Freq: Every day | ORAL | Status: DC
  Administered 2019-02-25 – 2019-02-27 (×3): 8 mg via ORAL
  Filled 2019-02-25 (×3): qty 2

## 2019-02-25 MED ORDER — SODIUM CHLORIDE 0.9 % IV SOLN
200.0000 mg | Freq: Once | INTRAVENOUS | Status: AC
  Administered 2019-02-26: 200 mg via INTRAVENOUS
  Filled 2019-02-25: qty 40

## 2019-02-25 MED ORDER — VITAMIN D 25 MCG (1000 UNIT) PO TABS
1000.0000 [IU] | ORAL_TABLET | Freq: Every day | ORAL | Status: DC
  Administered 2019-02-26 – 2019-02-27 (×2): 1000 [IU] via ORAL
  Filled 2019-02-25 (×2): qty 1

## 2019-02-25 MED ORDER — INSULIN ASPART 100 UNIT/ML ~~LOC~~ SOLN
10.0000 [IU] | Freq: Three times a day (TID) | SUBCUTANEOUS | Status: DC
  Administered 2019-02-26 – 2019-02-27 (×5): 10 [IU] via SUBCUTANEOUS
  Filled 2019-02-25 (×5): qty 1

## 2019-02-25 MED ORDER — ATORVASTATIN CALCIUM 20 MG PO TABS
10.0000 mg | ORAL_TABLET | Freq: Every day | ORAL | Status: DC
  Administered 2019-02-25 – 2019-02-26 (×2): 10 mg via ORAL
  Filled 2019-02-25 (×2): qty 1

## 2019-02-25 MED ORDER — VANCOMYCIN HCL 10 G IV SOLR
2000.0000 mg | Freq: Once | INTRAVENOUS | Status: AC
  Administered 2019-02-25: 2000 mg via INTRAVENOUS
  Filled 2019-02-25: qty 2000

## 2019-02-25 MED ORDER — INSULIN GLARGINE 100 UNIT/ML ~~LOC~~ SOLN
44.0000 [IU] | Freq: Every day | SUBCUTANEOUS | Status: DC
  Administered 2019-02-25: 44 [IU] via SUBCUTANEOUS
  Filled 2019-02-25 (×2): qty 0.44

## 2019-02-25 MED ORDER — SODIUM CHLORIDE 0.9% FLUSH: 3.0000 mL | Freq: Once | INTRAVENOUS | Status: DC

## 2019-02-25 MED ORDER — IOHEXOL 350 MG/ML SOLN
75.0000 mL | Freq: Once | INTRAVENOUS | Status: AC | PRN
  Administered 2019-02-25: 75 mL via INTRAVENOUS

## 2019-02-25 MED ORDER — GABAPENTIN 100 MG PO CAPS
100.0000 mg | ORAL_CAPSULE | Freq: Three times a day (TID) | ORAL | Status: DC
  Administered 2019-02-25 – 2019-02-27 (×6): 100 mg via ORAL
  Filled 2019-02-25 (×6): qty 1

## 2019-02-25 MED ORDER — ONDANSETRON HCL 4 MG/2ML IJ SOLN: 4.0000 mg | Freq: Four times a day (QID) | INTRAMUSCULAR | Status: DC | PRN

## 2019-02-25 MED ORDER — PANTOPRAZOLE SODIUM 40 MG PO TBEC
40.0000 mg | DELAYED_RELEASE_TABLET | Freq: Every day | ORAL | Status: DC
  Administered 2019-02-25 – 2019-02-27 (×3): 40 mg via ORAL
  Filled 2019-02-25 (×3): qty 1

## 2019-02-25 MED ORDER — SODIUM CHLORIDE 0.9 % IV SOLN
500.0000 mg | INTRAVENOUS | Status: DC
  Administered 2019-02-25 – 2019-02-26 (×2): 500 mg via INTRAVENOUS
  Filled 2019-02-25 (×3): qty 500

## 2019-02-25 MED ORDER — SODIUM CHLORIDE 0.9 % IV SOLN
2.0000 g | Freq: Three times a day (TID) | INTRAVENOUS | Status: DC
  Administered 2019-02-25 – 2019-02-27 (×5): 2 g via INTRAVENOUS
  Filled 2019-02-25 (×9): qty 2

## 2019-02-25 MED ORDER — MORPHINE SULFATE (PF) 2 MG/ML IV SOLN
2.0000 mg | INTRAVENOUS | Status: DC | PRN
  Administered 2019-02-26: 2 mg via INTRAVENOUS
  Filled 2019-02-25: qty 1

## 2019-02-25 MED ORDER — HYDROMORPHONE HCL 1 MG/ML IJ SOLN
INTRAMUSCULAR | Status: AC
  Filled 2019-02-25: qty 1

## 2019-02-25 MED ORDER — FLUTICASONE PROPIONATE 50 MCG/ACT NA SUSP
1.0000 | Freq: Every day | NASAL | Status: DC | PRN
  Filled 2019-02-25: qty 16

## 2019-02-25 MED ORDER — LORATADINE 10 MG PO TABS
10.0000 mg | ORAL_TABLET | Freq: Every day | ORAL | Status: DC
  Filled 2019-02-25 (×2): qty 1

## 2019-02-25 MED ORDER — ACETAMINOPHEN 500 MG PO TABS: 500.0000 mg | ORAL_TABLET | Freq: Four times a day (QID) | ORAL | Status: DC | PRN

## 2019-02-25 MED ORDER — MECLIZINE HCL 25 MG PO TABS
25.0000 mg | ORAL_TABLET | Freq: Three times a day (TID) | ORAL | Status: DC | PRN
  Filled 2019-02-25: qty 1

## 2019-02-25 MED ORDER — ENOXAPARIN SODIUM 120 MG/0.8ML ~~LOC~~ SOLN
1.0000 mg/kg | Freq: Two times a day (BID) | SUBCUTANEOUS | Status: DC
  Administered 2019-02-25 – 2019-02-26 (×3): 120 mg via SUBCUTANEOUS
  Filled 2019-02-25 (×3): qty 0.8

## 2019-02-25 MED ORDER — ONDANSETRON HCL 4 MG PO TABS: 4.0000 mg | ORAL_TABLET | Freq: Four times a day (QID) | ORAL | Status: DC | PRN

## 2019-02-25 MED ORDER — TRAMADOL HCL 50 MG PO TABS
50.0000 mg | ORAL_TABLET | Freq: Four times a day (QID) | ORAL | Status: DC | PRN
  Administered 2019-02-25 – 2019-02-26 (×3): 50 mg via ORAL
  Filled 2019-02-25 (×3): qty 1

## 2019-02-25 MED ORDER — SODIUM CHLORIDE 0.9 % IV SOLN
100.0000 mg | INTRAVENOUS | Status: DC
  Administered 2019-02-27: 100 mg via INTRAVENOUS
  Filled 2019-02-25 (×2): qty 20

## 2019-02-25 MED ORDER — HYDROMORPHONE HCL 1 MG/ML IJ SOLN
0.5000 mg | Freq: Once | INTRAMUSCULAR | Status: AC
  Administered 2019-02-25: 0.5 mg via INTRAVENOUS
  Filled 2019-02-25: qty 1

## 2019-02-25 MED ORDER — METHYLPREDNISOLONE SODIUM SUCC 125 MG IJ SOLR
60.0000 mg | Freq: Four times a day (QID) | INTRAMUSCULAR | Status: DC
  Administered 2019-02-25 – 2019-02-26 (×3): 60 mg via INTRAVENOUS
  Filled 2019-02-25 (×3): qty 2

## 2019-02-25 MED ORDER — HYDROMORPHONE HCL 1 MG/ML IJ SOLN
1.0000 mg | Freq: Once | INTRAMUSCULAR | Status: AC
  Administered 2019-02-25: 1 mg via INTRAVENOUS

## 2019-02-25 MED ORDER — KETOROLAC TROMETHAMINE 30 MG/ML IJ SOLN
30.0000 mg | Freq: Once | INTRAMUSCULAR | Status: AC
  Administered 2019-02-25: 30 mg via INTRAVENOUS
  Filled 2019-02-25: qty 1

## 2019-02-25 MED ORDER — BISMUTH SUBSALICYLATE 262 MG PO CHEW
524.0000 mg | CHEWABLE_TABLET | ORAL | Status: DC | PRN
  Filled 2019-02-25: qty 2

## 2019-02-25 MED ORDER — ACETAMINOPHEN 325 MG PO TABS
650.0000 mg | ORAL_TABLET | Freq: Once | ORAL | Status: AC
  Administered 2019-02-25: 650 mg via ORAL
  Filled 2019-02-25: qty 2

## 2019-02-25 MED ORDER — VITAMIN C 500 MG PO TABS
500.0000 mg | ORAL_TABLET | Freq: Two times a day (BID) | ORAL | Status: DC
  Administered 2019-02-25 – 2019-02-27 (×4): 500 mg via ORAL
  Filled 2019-02-25 (×4): qty 1

## 2019-02-25 MED ORDER — ALBUTEROL SULFATE (2.5 MG/3ML) 0.083% IN NEBU: 2.5000 mg | INHALATION_SOLUTION | RESPIRATORY_TRACT | Status: DC | PRN

## 2019-02-25 MED ORDER — DOCUSATE SODIUM 100 MG PO CAPS
100.0000 mg | ORAL_CAPSULE | Freq: Two times a day (BID) | ORAL | Status: DC | PRN
  Administered 2019-02-26: 100 mg via ORAL
  Filled 2019-02-25: qty 1

## 2019-02-25 MED ORDER — ZINC SULFATE 220 (50 ZN) MG PO CAPS
220.0000 mg | ORAL_CAPSULE | Freq: Every day | ORAL | Status: DC
  Administered 2019-02-26 – 2019-02-27 (×2): 220 mg via ORAL
  Filled 2019-02-25 (×2): qty 1

## 2019-02-25 NOTE — Progress Notes (Signed)
Family Meeting Note  Advance Directive:yes  Today a meeting took place with the Patient, spouse at bedside     The following clinical team members were present during this meeting:MD  The following were discussed:Patient's diagnosis: Acute pleuritic chest pain, pulmonary embolism, COVID pneumonia, insulin requiring diabetes mellitus, GERD, treatment plan of care discussed in detail with the patient.  He verbalized understanding of the plan.    Patient's progosis: Unable to determine and Goals for treatment: Full Code  Wife is healthcare POA  Additional follow-up to be provided: Hospitalist, pulmonology  Time spent during discussion:17 min  Nicholes Mango, MD

## 2019-02-25 NOTE — ED Provider Notes (Signed)
[facility] Emergency Department Provider Note  ____________________________________________   First MD Initiated Contact with Patient 02/25/19 1341     (approximate)  I have reviewed the triage vital signs and the nursing notes.   HISTORY  Chief Complaint Shortness of Breath and Chest Pain    HPI Michael Gill is a 58 y.o. male with diabetes, prior MI, TIA, DVT 2016, A. fib not on anticoagulation who presents with shortness of breath and chest pain.  Symptoms have been going on for a couple days. Denies this happening previously.  Chest pain is over whole chest, pressure sensation, nothing makes it better, worse with breathing and with pushing on his chest wall.  Does endorse some SOB with this as well. No fevers. Has had cough. Prior DVT.       Past Medical History:  Diagnosis Date  . Asthma   . B12 deficiency    in the past  . CAD (coronary artery disease)    s/p MI  . Colon cancer ([place]) 1990  . Complication of anesthesia    long time to wake up from colonoscopy.  . Conversion disorder    difficulty with visual disturbances and changes with unknown etiology, along with headaches  . CVA (cerebral infarction)   . 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 DVT (deep venous thrombosis) ([place]) 2016   behind right knee  . Dyspnea 09/2018   with exercise  . Dysrhythmia    has had Afib in past.  . GERD (gastroesophageal reflux disease)   . History of kidney stones 1990  . Hx of therapeutic radiation   . Hyperlipidemia   . Myocardial infarction ([place]) 1990  . Neuropathic pain, leg   . 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  . Seizure ([place])    last episode 08/2017. has no warning, will just happen. nothing specific precipitates event  . Sleep apnea    does not use cpap  . 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  Stroke [facility]) 2012, 03/2018   residual tremor left hand, facial droop  . Tremors of nervous system    specifically left hand, since stroke    Patient Active Problem List   Diagnosis Date Noted  . Sepsis ([place]) 01/30/2019  . Deep vein thrombosis (DVT) of popliteal vein of right lower extremity ([place]) 10/30/2014  . TIA (transient ischemic attack) 01/19/2014    Past Surgical History:  Procedure Laterality Date  . APPENDECTOMY  1971  . CARDIAC CATHETERIZATION Left 10/12/2015   Procedure: Left Heart Cath and Coronary Angiography;  Surgeon: Yolonda Kida, MD;  Location: [facility];  Service: Cardiovascular;  Laterality: Left;  . TONSILLECTOMY      Prior to Admission medications   Medication Sig Start Date End Date Taking? Authorizing Provider  acetaminophen (TYLENOL) 500 MG tablet Take 500 mg by mouth every 6 (six) hours as needed for moderate pain.    [provider]  albuterol (PROVENTIL HFA;VENTOLIN HFA) 108 (90 Base) MCG/ACT inhaler Inhale 1-2 puffs into the lungs every 4 (four) hours as needed for wheezing or shortness of breath.    [provider]  aspirin EC 81 MG tablet Take 81 mg by mouth every morning.     [provider]  atorvastatin (LIPITOR) 10 MG tablet Take 10 mg by mouth daily.  [provider]  bismuth subsalicylate (PEPTO BISMOL) 262 MG chewable tablet Chew 524 mg by mouth as needed.    [provider]  cetirizine (ZYRTEC) 10 MG tablet Take 10 mg by mouth daily.    [provider]  fluticasone (FLONASE) 50 MCG/ACT nasal spray Place 1 spray into both nostrils daily as needed for allergies or rhinitis.    [provider]  gabapentin (NEURONTIN) 100 MG capsule Take 100 mg by mouth 3 (three) times daily.     [provider]  ibuprofen (ADVIL,MOTRIN) 600 MG tablet Take 600 mg by mouth every 8 (eight) hours as needed (PAIN).    [provider]  insulin aspart (NOVOLOG) 100 UNIT/ML  injection Inject 10 Units into the skin 3 (three) times daily with meals.    [provider]  insulin glargine (LANTUS) 100 UNIT/ML injection Inject 36 Units into the skin daily.    [provider]  meclizine (ANTIVERT) 25 MG tablet Take 25 mg by mouth 3 (three) times daily as needed for dizziness.    [provider]  omeprazole (PRILOSEC) 40 MG capsule Take 40 mg by mouth daily.    [provider]    Allergies Shellfish allergy, Codeine, and Sulfa antibiotics  Family History  Problem Relation Age of Onset  . Alzheimer's disease Mother   . Cancer Brother     Social History Social History   Tobacco Use  . Smoking status: Never Smoker  . Smokeless tobacco: Never Used  Substance Use Topics  . Alcohol use: No  . Drug use: No      Review of Systems Constitutional: No fever/chills Eyes: No visual changes. ENT: No sore throat. Cardiovascular: + chest pain  Respiratory: + shortness of breath. Gastrointestinal: No abdominal pain.  No nausea, no vomiting.  No diarrhea.  No constipation. Genitourinary: Negative for dysuria. Musculoskeletal: Negative for back pain. Skin: Negative for rash. Neurological: Negative for headaches, focal weakness or numbness. All other ROS negative ____________________________________________   PHYSICAL EXAM:  VITAL SIGNS: ED Triage Vitals  Enc Vitals Group     BP 02/25/19 1223 126/79     Pulse Rate 02/25/19 1223 (!) 102     Resp 02/25/19 1223 16     Temp 02/25/19 1223 98.5 F (36.9 C)     Temp Source 02/25/19 1223 Oral     SpO2 02/25/19 1223 96 %     Weight --      Height --      Head Circumference --      Peak Flow --      Pain Score 02/25/19 1221 8     Pain Loc --      Pain Edu? --      Excl. in [place]? --     Constitutional: Alert and oriented. Well appearing and in no acute distress. Eyes: Conjunctivae are normal. EOMI. Head: Atraumatic. Nose: No congestion/rhinnorhea. Mouth/Throat: Mucous  membranes are moist.   Neck: No stridor. Trachea Midline. FROM Cardiovascular: tachycardiac. regular rhythm. Grossly normal heart sounds.  Good peripheral circulation. Respiratory: Normal respiratory effort.  No retractions. Lungs CTAB. Gastrointestinal: Soft and nontender. No distention. No abdominal bruits.  Musculoskeletal: No lower extremity tenderness nor edema.  No joint effusions. Neurologic:  Normal speech and language. No gross focal neurologic deficits are appreciated.  Skin:  Skin is warm, dry and intact. No rash noted. Psychiatric: Mood and affect are normal. Speech and behavior are normal. GU: Deferred   ____________________________________________   LABS (all labs  ordered are listed, but only abnormal results are displayed)  Labs Reviewed  SARS CORONAVIRUS 2 ([facility]) - Abnormal; Notable for the following components:      Result Value   SARS Coronavirus 2 POSITIVE (*)    All other components within normal limits  BASIC METABOLIC PANEL - Abnormal; Notable for the following components:   Glucose, Bld 125 (*)    Calcium 8.3 (*)    All other components within normal limits  HEPATIC FUNCTION PANEL - Abnormal; Notable for the following components:   Albumin 3.4 (*)    All other components within normal limits  FERRITIN - Abnormal; Notable for the following components:   Ferritin 406 (*)    All other components within normal limits  CBC  APTT  PROTIME-INR  LACTATE DEHYDROGENASE  CBC  BASIC METABOLIC PANEL  C-REACTIVE PROTEIN  FERRITIN  LACTATE DEHYDROGENASE  CBC WITH DIFFERENTIAL/PLATELET  TROPONIN I (HIGH SENSITIVITY)  TROPONIN I (HIGH SENSITIVITY)   ____________________________________________   ED ECG REPORT I, Vanessa [place], the attending physician, personally viewed and interpreted this ECG.  EKG is sinus tachycardia rate of 105, no ST elevation, no T wave inversion, normal intervals  ____________________________________________  RADIOLOGY Robert Bellow, personally viewed and evaluated these images (plain radiographs) as part of my medical decision making, as well as reviewing the written report by the radiologist.  ED MD interpretation:  Chest xray possible opacities bilaterally.   Official radiology report(s): Dg Chest 2 View  Result Date: 02/25/2019 CLINICAL DATA:  Chest pain with shortness of breath for 2 days. Recent hospitalization for urinary tract infection. Diabetes. EXAM: CHEST - 2 VIEW COMPARISON:  Radiographs 04/04/2018.  Abdominal CT 01/30/2019. FINDINGS: There are lower lung volumes. The heart size and mediastinal contours are stable. There are new patchy bibasilar pulmonary opacities. There is no confluent airspace opacity, edema, pleural effusion or pneumothorax. Mild degenerative changes are present in the spine. There are no acute osseous findings. IMPRESSION: New patchy bibasilar pulmonary opacities radiographically most consistent with atelectasis. An early infiltrate in the right middle lobe is difficult to exclude. Electronically Signed   By: Richardean Sale M.D.   On: 02/25/2019 13:02   Ct Angio Chest Pe W And/or Wo Contrast  Result Date: 02/25/2019 CLINICAL DATA:  Chest pain and shortness of breath EXAM: CT ANGIOGRAPHY CHEST WITH CONTRAST TECHNIQUE: Multidetector CT imaging of the chest was performed using the standard protocol during bolus administration of intravenous contrast. Multiplanar CT image reconstructions and MIPs were obtained to evaluate the vascular anatomy. CONTRAST:  12mL OMNIPAQUE IOHEXOL 350 MG/ML SOLN COMPARISON:  April 20, 2016 FINDINGS: Cardiovascular: There are pulmonary emboli arising from the distal most aspect of the right main pulmonary artery extending into the proximal right upper and lower lobe pulmonary artery branches. Smaller pulmonary emboli are also seen in several right lower lobe pulmonary artery branches. There is  no appreciable pulmonary embolus on the left. The right ventricle to left ventricle diameter ratio is less than 0.9, not consistent with right heart strain. There is no thoracic aortic aneurysm or dissection. Visualized great vessels appear unremarkable. There is no appreciable pericardial effusion or pericardial thickening. Mediastinum/Nodes: Visualized thyroid appears normal. There are multiple subcentimeter mediastinal lymph nodes. There are no lymph nodes which meet size criteria for pathologic significance in the thoracic region. Several lymph nodes are slightly larger than was present on the 2017 study, however. No esophageal lesion evident. Lungs/Pleura: There is ground-glass type opacity  in both lower lobes involving multiple segments. There is also patchy ground-glass type opacity in each upper lobe and lingular regions. There is no frank airspace consolidation. No pulmonary infarct is demonstrable. Upper Abdomen: Spleen is incompletely visualized although spleen appears enlarged. Spleen measures 14.0 cm from right to left dimension. Visualized upper abdominal structures otherwise appear unremarkable. Musculoskeletal: No blastic or lytic bone lesions. No chest wall lesions evident. Review of the MIP images confirms the above findings. IMPRESSION: 1. Pulmonary emboli arising from the distal most aspect of the right main pulmonary artery extending into proximal right upper and lower lobe branches. Smaller pulmonary emboli noted in several lower lobe pulmonary artery branches. No right heart strain evident. 2. Multifocal ground-glass type opacity consistent with multifocal pneumonia. No consolidation. Suspect atypical organism pneumonia as most likely. 3. Multiple small lymph nodes which are slightly larger than on prior study from 2017 but do not meet size criteria for pathologic significance. Question reactive etiology to the multifocal pneumonia. 4. Incompletely visualized spleen. Spleen may well be  somewhat enlarged. Critical Value/emergent results were called by telephone at the time of interpretation on 02/25/2019 at 2:52 pm to Dr. Marjean Donna , who verbally acknowledged these results. Electronically Signed   By: Lowella Grip III M.D.   On: 02/25/2019 14:54    ____________________________________________   PROCEDURES  Procedure(s) performed (including Critical Care):  Procedures   ____________________________________________   INITIAL IMPRESSION / ASSESSMENT AND PLAN / ED COURSE  Michael Gill was evaluated in Emergency Department on 02/25/2019 for the symptoms described in the history of present illness. He was evaluated in the context of the global COVID-19 pandemic, which necessitated consideration that the patient might be at risk for infection with the SARS-CoV-2 virus that causes COVID-19. Institutional protocols and algorithms that pertain to the evaluation of patients at risk for COVID-19 are in a state of rapid change based on information released by regulatory bodies including the CDC and federal and state organizations. These policies and algorithms were followed during the patient's care in the ED.    Will get cardiac markers to rule out ACS.  Will get chest xray to rule out edema, PTX, PNA. Unlikely Dissection given description of pain. Will get labs to rule out anemia.  Given tachycardia and DVT history will get CT PE.   Labs show a slightly elevated glucose at 125 but no evidence of DKA.  White count is normal making infection less likely.  Troponin is 3.  Chest x-ray concerning for a possible early infiltrate in the right middle lobe vs atelectasis.   CT scan concerning for PE.  Will start on lovenox. Also concern for possible COVID. Will get COVID swab.   Covid +   5:28 PM D/w Dr. Bonner Puna.  Pt accepted to [facility].   7:15 PM We were informed that [facility] would not have any beds tonight.  Therefore we were told to admitted to our hospital.  Admit to  hospital team.        ____________________________________________   FINAL CLINICAL IMPRESSION(S) / ED DIAGNOSES   Final diagnoses:  Multiple subsegmental pulmonary emboli without acute cor pulmonale  COVID-19 virus detected      MEDICATIONS GIVEN DURING THIS VISIT:  Medications  sodium chloride flush (NS) 0.9 % injection 3 mL (has no administration in time range)     ED Discharge Orders    None       Note:  This document was prepared using Dragon voice recognition software  and may include unintentional dictation errors.   Vanessa Kayenta, MD 02/25/19 2023

## 2019-02-25 NOTE — Consult Note (Signed)
ANTICOAGULATION CONSULT NOTE - Initial Consult  Pharmacy Consult for Lovenox Dosing Indication: DVT  Allergies  Allergen Reactions  . Shellfish Allergy Hives    Swelling of throat and mouth. Betadine is NOT a problem  . Codeine Other (See Comments)    Reaction:  Hallucinations   . Sulfa Antibiotics Hives    Patient Measurements: Weight: 259 lb 14.8 oz (117.9 kg) Heparin Dosing Weight: N/A  Vital Signs: Temp: 98.5 F (36.9 C) (07/28 1223) Temp Source: Oral (07/28 1223) BP: 125/78 (07/28 1408) Pulse Rate: 98 (07/28 1417)  Labs: Recent Labs    02/25/19 1225  HGB 14.4  HCT 42.3  PLT 157  CREATININE 0.89  TROPONINIHS 3    Estimated Creatinine Clearance: 111.5 mL/min (by C-G formula based on SCr of 0.89 mg/dL).   Medical History: Past Medical History:  Diagnosis Date  . Asthma   . B12 deficiency    in the past  . CAD (coronary artery disease)    s/p MI  . Colon cancer ([place]) 1990  . Complication of anesthesia    long time to wake up from colonoscopy.  . Conversion disorder    difficulty with visual disturbances and changes with unknown etiology, along with headaches  . CVA (cerebral infarction)   . 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 DVT (deep venous thrombosis) ([place]) 2016   behind right knee  . Dyspnea 09/2018   with exercise  . Dysrhythmia    has had Afib in past.  . GERD (gastroesophageal reflux disease)   . History of kidney stones 1990  . Hx of therapeutic radiation   . Hyperlipidemia   . Myocardial infarction ([place]) 1990  . Neuropathic pain, leg   . 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  . Seizure ([place])    last episode 08/2017. has no warning, will just happen. nothing specific precipitates event  . Sleep apnea    does not use cpap  . 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 Stroke [facility]) 2012, 03/2018   residual tremor left hand, facial droop  . Tremors of nervous system    specifically left hand, since stroke    Medications:  (Not in a hospital admission)  Scheduled:  . enoxaparin (LOVENOX) injection  1 mg/kg Subcutaneous Q12H   Infusions:   PRN:  Anti-infectives (From admission, onward)   None      Assessment: Pharmacy has been consulted to dose Enoxaparin(Lovenox) therapy. Patient admitted with shortness of breath and chest pain that has been going on for a couple of days. Patient has no history of prior anticoagulant use. CT scan concerning for PE. Baseline labs have been drawn and results are pending.  Goal of Therapy:  Monitor platelets by anticoagulation protocol: Yes   Plan:  Lovenox 120mg  Q12 hours has been ordered(approximately 1mg /kg).   Will monitor levels with AM labs tomorrow.  Emsley Custer A Jawon Dipiero 02/25/2019,3:12 PM

## 2019-02-25 NOTE — ED Notes (Signed)
Pt had episode of urinary incontinents. Pt stood at side of bed and soiled brief changed.

## 2019-02-25 NOTE — ED Notes (Signed)
ED TO INPATIENT HANDOFF REPORT  ED Nurse Name and Phone #: Joelene Millin [phone]  S Name/Age/Gender Michael Gill 58 y.o. male Room/Bed: ED14A/ED14A  Code Status   Code Status: Prior  Home/SNF/Other Home Patient oriented to: self, place, time and situation Is this baseline? Yes   Triage Complete: Triage complete  Chief Complaint chest pain;sob  Triage Note Says chest pain and short of breath for 2 day sand it is getting worse.     Allergies Allergies  Allergen Reactions  . Shellfish Allergy Hives    Swelling of throat and mouth. Betadine is NOT a problem  . Codeine Other (See Comments)    Reaction:  Hallucinations   . Sulfa Antibiotics Hives    Level of Care/Admitting Diagnosis ED Disposition    ED Disposition Condition [facility]: [place] [[id number]]  Level of Care: Telemetry [5]  Covid Evaluation: Confirmed COVID Positive  Diagnosis: Pulmonary embolism ([place]) [[id number]]  Admitting Physician: Nicholes Mango [[id number]]  Attending Physician: Nicholes Mango [[id number]]  Estimated length of stay: past midnight tomorrow  Certification:: I certify this patient will need inpatient services for at least 2 midnights  Bed request comments: 2A  PT Class (Do Not Modify): Inpatient [101]  PT Acc Code (Do Not Modify): Private [1]       B Medical/Surgery History Past Medical History:  Diagnosis Date  . Asthma   . B12 deficiency    in the past  . CAD (coronary artery disease)    s/p MI  . Colon cancer ([place]) 1990  . Complication of anesthesia    long time to wake up from colonoscopy.  . Conversion disorder    difficulty with visual disturbances and changes with unknown etiology, along with headaches  . CVA (cerebral infarction)   . 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 DVT (deep venous thrombosis) ([place]) 2016   behind right knee  . Dyspnea 09/2018   with exercise  . Dysrhythmia    has had Afib in past.  . GERD  (gastroesophageal reflux disease)   . History of kidney stones 1990  . Hx of therapeutic radiation   . Hyperlipidemia   . Myocardial infarction ([place]) 1990  . Neuropathic pain, leg   . 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  . Seizure ([place])    last episode 08/2017. has no warning, will just happen. nothing specific precipitates event  . Sleep apnea    does not use cpap  . 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 Stroke [facility]) 2012, 03/2018   residual tremor left hand, facial droop  . Tremors of nervous system    specifically left hand, since stroke   Past Surgical History:  Procedure Laterality Date  . APPENDECTOMY  1971  . CARDIAC CATHETERIZATION Left 10/12/2015   Procedure: Left Heart Cath and Coronary Angiography;  Surgeon: Yolonda Kida, MD;  Location: [place] CV LAB;  Service: Cardiovascular;  Laterality: Left;  . TONSILLECTOMY       A IV Location/Drains/Wounds Patient Lines/Drains/Airways Status   Active Line/Drains/Airways    Name:   Placement date:   Placement time:   Site:   Days:   Peripheral IV 02/24/18 Left;Upper Arm   02/24/18    1846    Arm   366   Peripheral IV 02/25/19 Left;Right  Arm   02/25/19    1429    Arm   less than 1          Intake/Output Last 24 hours No intake or output data in the 24 hours ending 02/25/19 1923  Labs/Imaging Results for orders placed or performed during the hospital encounter of 02/25/19 (from the past 48 hour(s))  Basic metabolic panel     Status: Abnormal   Collection Time: 02/25/19 12:25 PM  Result Value Ref Range   Sodium 137 135 - 145 mmol/L   Potassium 3.9 3.5 - 5.1 mmol/L   Chloride 106 98 - 111 mmol/L   CO2 23 22 - 32 mmol/L   Glucose, Bld 125 (H) 70 - 99 mg/dL   BUN 11 6 - 20 mg/dL   Creatinine, Ser 0.89 0.61 - 1.24 mg/dL   Calcium 8.3 (L) 8.9 - 10.3 mg/dL   GFR calc non Af Amer >60 >60 mL/min    GFR calc Af Amer >60 >60 mL/min   Anion gap 8 5 - 15    Comment: Performed at [facility], [address] [place], [place] [address]  CBC     Status: None   Collection Time: 02/25/19 12:25 PM  Result Value Ref Range   WBC 6.4 4.0 - 10.5 K/uL   RBC 4.72 4.22 - 5.81 MIL/uL   Hemoglobin 14.4 13.0 - 17.0 g/dL   HCT 42.3 39.0 - 52.0 %   MCV 89.6 80.0 - 100.0 fL   MCH 30.5 26.0 - 34.0 pg   MCHC 34.0 30.0 - 36.0 g/dL   RDW 12.9 11.5 - 15.5 %   Platelets 157 150 - 400 K/uL   nRBC 0.0 0.0 - 0.2 %    Comment: Performed at [facility], [place], [place] [address]  Troponin I (High Sensitivity)     Status: None   Collection Time: 02/25/19 12:25 PM  Result Value Ref Range   Troponin I (High Sensitivity) 3 <18 ng/L    Comment: (NOTE) Elevated high sensitivity troponin I (hsTnI) values and significant  changes across serial measurements may suggest ACS but many other  chronic and acute conditions are known to elevate hsTnI results.  Refer to the "Links" section for chest pain algorithms and additional  guidance. Performed at [facility], [place], [place] [address]   Troponin I (High Sensitivity)     Status: None   Collection Time: 02/25/19  2:50 PM  Result Value Ref Range   Troponin I (High Sensitivity) 4 <18 ng/L    Comment: (NOTE) Elevated high sensitivity troponin I (hsTnI) values and significant  changes across serial measurements may suggest ACS but many other  chronic and acute conditions are known to elevate hsTnI results.  Refer to the "Links" section for chest pain algorithms and additional  guidance. Performed at [facility], [address] [place], [place] [address]   APTT     Status: None   Collection Time: 02/25/19  2:58 PM  Result Value Ref Range   aPTT 29 24 - 36 seconds    Comment: Performed at [facility], [address] [place],  [address]  Protime-INR     Status: None    Collection Time: 02/25/19  2:58 PM  Result Value Ref Range   Prothrombin Time 12.7 11.4 - 15.2 seconds   INR 1.0 0.8 - 1.2    Comment: (NOTE) INR goal varies based on device and disease states. Performed  at [facility], [address], [place], [place] [address]   SARS Coronavirus 2 (CEPHEID- Performed in [facility] hospital lab), Hosp Order     Status: Abnormal   Collection Time: 02/25/19  3:27 PM   Specimen: Nasopharyngeal Swab  Result Value Ref Range   SARS Coronavirus 2 POSITIVE (A) NEGATIVE    Comment: RESULT CALLED TO, READ BACK BY AND VERIFIED WITH: AMBER PAYNE AT 1651 ON 02/25/2019 [place]. (NOTE) If result is NEGATIVE SARS-CoV-2 target nucleic acids are NOT DETECTED. The SARS-CoV-2 RNA is generally detectable in upper and lower  respiratory specimens during the acute phase of infection. The lowest  concentration of SARS-CoV-2 viral copies this assay can detect is 250  copies / mL. A negative result does not preclude SARS-CoV-2 infection  and should not be used as the sole basis for treatment or other  patient management decisions.  A negative result may occur with  improper specimen collection / handling, submission of specimen other  than nasopharyngeal swab, presence of viral mutation(s) within the  areas targeted by this assay, and inadequate number of viral copies  (<250 copies / mL). A negative result must be combined with clinical  observations, patient history, and epidemiological information. If result is POSITIVE SARS-CoV-2 target nucleic acids are DETECTED.  The SARS-CoV-2 RNA is generally detectable in upper and lower  respiratory specimens during the acute phase of infection.  Positive  results are indicative of active infection with SARS-CoV-2.  Clinical  correlation with patient history and other diagnostic information is  necessary to determine patient infection status.  Positive results do  not rule out bacterial infection or co-infection with  other viruses. If result is PRESUMPTIVE POSTIVE SARS-CoV-2 nucleic acids MAY BE PRESENT.   A presumptive positive result was obtained on the submitted specimen  and confirmed on repeat testing.  While 2019 novel coronavirus  (SARS-CoV-2) nucleic acids may be present in the submitted sample  additional confirmatory testing may be necessary for epidemiological  and / or clinical management purposes  to differentiate between  SARS-CoV-2 and other Sarbecovirus currently known to infect humans.  If clinically indicated additional testing with an alternate test  methodology [id number])  is advised. The SARS-CoV-2 RNA is generally  detectable in upper and lower respiratory specimens during the acute  phase of infection. The expected result is Negative. Fact Sheet for Patients:  StrictlyIdeas.no Fact Sheet for Healthcare Providers: BankingDealers.co.za This test is not yet approved or cleared by the [place] FDA and has been authorized for detection and/or diagnosis of SARS-CoV-2 by FDA under an Emergency Use Authorization (EUA).  This EUA will remain in effect (meaning this test can be used) for the duration of the COVID-19 declaration under Section 564(b)(1) of the Act, 21 U.S.C. section 360bbb-3(b)(1), unless the authorization is terminated or revoked sooner. Performed at [facility], [address] [place], [place] [address]   Hepatic function panel     Status: Abnormal   Collection Time: 02/25/19  5:35 PM  Result Value Ref Range   Total Protein 6.6 6.5 - 8.1 g/dL   Albumin 3.4 (L) 3.5 - 5.0 g/dL   AST 16 15 - 41 U/L   ALT 19 0 - 44 U/L   Alkaline Phosphatase 67 38 - 126 U/L   Total Bilirubin 0.9 0.3 - 1.2 mg/dL   Bilirubin, Direct 0.1 0.0 - 0.2 mg/dL   Indirect Bilirubin 0.8 0.3 - 0.9 mg/dL    Comment: Performed at [facility], [address] [place], [place] [address]  Ferritin     Status: Abnormal    Collection Time: 02/25/19  5:35 PM  Result Value Ref Range   Ferritin 406 (H) 24 - 336 ng/mL    Comment: Performed at [facility], [address] [place], [place] [address]  Lactate dehydrogenase     Status: None   Collection Time: 02/25/19  5:35 PM  Result Value Ref Range   LDH 149 98 - 192 U/L    Comment: Performed at [facility], [address] [place], [place] [address]   Dg Chest 2 View  Result Date: 02/25/2019 CLINICAL DATA:  Chest pain with shortness of breath for 2 days. Recent hospitalization for urinary tract infection. Diabetes. EXAM: CHEST - 2 VIEW COMPARISON:  Radiographs 04/04/2018.  Abdominal CT 01/30/2019. FINDINGS: There are lower lung volumes. The heart size and mediastinal contours are stable. There are new patchy bibasilar pulmonary opacities. There is no confluent airspace opacity, edema, pleural effusion or pneumothorax. Mild degenerative changes are present in the spine. There are no acute osseous findings. IMPRESSION: New patchy bibasilar pulmonary opacities radiographically most consistent with atelectasis. An early infiltrate in the right middle lobe is difficult to exclude. Electronically Signed   By: Richardean Sale M.D.   On: 02/25/2019 13:02   Ct Angio Chest Pe W And/or Wo Contrast  Result Date: 02/25/2019 CLINICAL DATA:  Chest pain and shortness of breath EXAM: CT ANGIOGRAPHY CHEST WITH CONTRAST TECHNIQUE: Multidetector CT imaging of the chest was performed using the standard protocol during bolus administration of intravenous contrast. Multiplanar CT image reconstructions and MIPs were obtained to evaluate the vascular anatomy. CONTRAST:  34mL OMNIPAQUE IOHEXOL 350 MG/ML SOLN COMPARISON:  April 20, 2016 FINDINGS: Cardiovascular: There are pulmonary emboli arising from the distal most aspect of the right main pulmonary artery extending into the proximal right upper and lower lobe pulmonary artery branches. Smaller pulmonary emboli are also  seen in several right lower lobe pulmonary artery branches. There is no appreciable pulmonary embolus on the left. The right ventricle to left ventricle diameter ratio is less than 0.9, not consistent with right heart strain. There is no thoracic aortic aneurysm or dissection. Visualized great vessels appear unremarkable. There is no appreciable pericardial effusion or pericardial thickening. Mediastinum/Nodes: Visualized thyroid appears normal. There are multiple subcentimeter mediastinal lymph nodes. There are no lymph nodes which meet size criteria for pathologic significance in the thoracic region. Several lymph nodes are slightly larger than was present on the 2017 study, however. No esophageal lesion evident. Lungs/Pleura: There is ground-glass type opacity in both lower lobes involving multiple segments. There is also patchy ground-glass type opacity in each upper lobe and lingular regions. There is no frank airspace consolidation. No pulmonary infarct is demonstrable. Upper Abdomen: Spleen is incompletely visualized although spleen appears enlarged. Spleen measures 14.0 cm from right to left dimension. Visualized upper abdominal structures otherwise appear unremarkable. Musculoskeletal: No blastic or lytic bone lesions. No chest wall lesions evident. Review of the MIP images confirms the above findings. IMPRESSION: 1. Pulmonary emboli arising from the distal most aspect of the right main pulmonary artery extending into proximal right upper and lower lobe branches. Smaller pulmonary emboli noted in several lower lobe pulmonary artery branches. No right heart strain evident. 2. Multifocal ground-glass type opacity consistent with multifocal pneumonia. No consolidation. Suspect atypical organism pneumonia as most likely. 3. Multiple small lymph nodes which are slightly larger than on prior study from 2017 but do not  meet size criteria for pathologic significance. Question reactive etiology to the multifocal  pneumonia. 4. Incompletely visualized spleen. Spleen may well be somewhat enlarged. Critical Value/emergent results were called by telephone at the time of interpretation on 02/25/2019 at 2:52 pm to Dr. Marjean Donna , who verbally acknowledged these results. Electronically Signed   By: Lowella Grip III M.D.   On: 02/25/2019 14:54    Pending Labs Unresulted Labs (From admission, onward)    Start     Ordered   02/26/19 0500  CBC  Daily,   STAT     02/25/19 1457   02/26/19 1779  Basic metabolic panel  Tomorrow morning,   STAT     02/25/19 1521   02/25/19 1735  C-reactive protein  Add-on,   AD     02/25/19 1736          Vitals/Pain Today's Vitals   02/25/19 1830 02/25/19 1845 02/25/19 1900 02/25/19 1915  BP: 135/85     Pulse: 72 76 76 79  Resp: (!) 22 (!) 23 (!) 23 (!) 23  Temp:      TempSrc:      SpO2: 100% 99% 99% 96%  Weight:      PainSc:        Isolation Precautions Airborne and Contact precautions  Medications Medications  enoxaparin (LOVENOX) injection 120 mg (120 mg Subcutaneous Given 02/25/19 1733)  HYDROmorphone (DILAUDID) 1 MG/ML injection (has no administration in time range)  dexamethasone (DECADRON) tablet 8 mg (8 mg Oral Given 02/25/19 1816)  methylPREDNISolone sodium succinate (SOLU-MEDROL) 125 mg/2 mL injection 60 mg (has no administration in time range)  HYDROmorphone (DILAUDID) injection 0.5 mg (has no administration in time range)  azithromycin (ZITHROMAX) 500 mg in sodium chloride 0.9 % 250 mL IVPB (has no administration in time range)  albuterol (PROVENTIL) (2.5 MG/3ML) 0.083% nebulizer solution 2.5 mg (has no administration in time range)  acetaminophen (TYLENOL) tablet 650 mg (650 mg Oral Given 02/25/19 1442)  ketorolac (TORADOL) 30 MG/ML injection 30 mg (30 mg Intravenous Given 02/25/19 1448)  iohexol (OMNIPAQUE) 350 MG/ML injection 75 mL (75 mLs Intravenous Contrast Given 02/25/19 1432)  HYDROmorphone (DILAUDID) injection 1 mg (1 mg Intravenous Given  02/25/19 1615)    Mobility walks Low fall risk   Focused Assessments Pulmonary Assessment Handoff:  Lung sounds:   O2 Device: Room Air        R Recommendations: See Admitting Provider Note  Report given to:   Additional Notes: Covid positive

## 2019-02-25 NOTE — ED Notes (Signed)
Brief changed ?

## 2019-02-25 NOTE — Care Management (Signed)
RNCM consult received for Lovenox however after speaking with EDP patient will be bedded and doubt he will discharge on Lovenox.

## 2019-02-25 NOTE — ED Notes (Signed)
Patient placed on 2L O2 due to sats dropping from pain medication

## 2019-02-25 NOTE — ED Triage Notes (Signed)
Says chest pain and short of breath for 2 day sand it is getting worse.

## 2019-02-25 NOTE — H&P (Signed)
[place] [facility] [place] NAME: Michael Gill    MR#:  [id number]  DATE OF BIRTH:  1961-02-24  DATE OF ADMISSION:  02/25/2019  PRIMARY CARE PHYSICIAN: Letta Median, MD   REQUESTING/REFERRING PHYSICIAN: Nickolas Madrid  CHIEF COMPLAINT:   Chest pain, shortness of breath HISTORY OF PRESENT ILLNESS:  Michael Gill  is a 58 y.o. male with a known history of insulin requiring diabetes mellitus, history of TIA, DVT in either 2016 completed his treatment with anticoagulation, atrial fibrillation not on any anticoagulation is presenting to the ED with a chief complaint of shortness of breath and chest pain and he is COVID positive.  CT angiogram of the chest with multifocal pneumonia and pulmonary embolism.  Patient is started on anticoagulation therapeutic dose and hospitalist team is called to admit the patient.  Decadron IV was given in the ED. patient was not transferred to [facility] as hospital was full.  Patient is agreeable to be admitted here and wife at bedside.  PAST MEDICAL HISTORY:   Past Medical History:  Diagnosis Date  . Asthma   . B12 deficiency    in the past  . CAD (coronary artery disease)    s/p MI  . Colon cancer ([place]) 1990  . Complication of anesthesia    long time to wake up from colonoscopy.  . Conversion disorder    difficulty with visual disturbances and changes with unknown etiology, along with headaches  . CVA (cerebral infarction)   . Diabetes mellitus ([place])    type 2 insulin dependant  . DVT (deep venous thrombosis) ([place]) 2016   behind right knee  . Dyspnea 09/2018   with exercise  . Dysrhythmia    has had Afib in past.  . GERD (gastroesophageal reflux disease)   . History of kidney stones 1990  . Hx of therapeutic radiation   . Hyperlipidemia   . Myocardial infarction ([place]) 1990  . Neuropathic pain, leg   . Pseudoseizure    last episode 2/19.[name] HA, slurry speach, eyes twirl, frothy at mouth,  tremors of hand.  . Seizure ([place])    last episode 08/2017. has no warning, will just happen. nothing specific precipitates event  . Sleep apnea    does not use cpap  . 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 Stroke [facility]) 2012, 03/2018   residual tremor left hand, facial droop  . Tremors of nervous system    specifically left hand, since stroke    PAST SURGICAL HISTOIRY:   Past Surgical History:  Procedure Laterality Date  . APPENDECTOMY  1971  . CARDIAC CATHETERIZATION Left 10/12/2015   Procedure: Left Heart Cath and Coronary Angiography;  Surgeon: Yolonda Kida, MD;  Location: [facility];  Service: Cardiovascular;  Laterality: Left;  . TONSILLECTOMY      SOCIAL HISTORY:   Social History   Tobacco Use  . Smoking status: Never Smoker  . Smokeless tobacco: Never Used  Substance Use Topics  . Alcohol use: No    FAMILY HISTORY:   Family History  Problem Relation Age of Onset  . Alzheimer's disease Mother   . Cancer Brother     DRUG ALLERGIES:   Allergies  Allergen Reactions  . Shellfish Allergy Hives    Swelling of throat and mouth. Betadine is NOT a problem  . Codeine Other (See Comments)    Reaction:  Hallucinations   .  Sulfa Antibiotics Hives    REVIEW OF SYSTEMS:  CONSTITUTIONAL: No fever, fatigue or weakness.  EYES: No blurred or double vision.  EARS, NOSE, AND THROAT: No tinnitus or ear pain.  RESPIRATORY: Reporting cough and chest pain , dyspnea on exertion  CARDIOVASCULAR: No chest pain, orthopnea, edema.  GASTROINTESTINAL: No nausea, vomiting, diarrhea or abdominal pain.  GENITOURINARY: No dysuria, hematuria.  ENDOCRINE: No polyuria, nocturia,  HEMATOLOGY: No anemia, easy bruising or bleeding SKIN: No rash or lesion. MUSCULOSKELETAL: No joint pain or arthritis.   NEUROLOGIC: No tingling, numbness, weakness.  PSYCHIATRY: No anxiety or depression.   MEDICATIONS AT HOME:   Prior to  Admission medications   Medication Sig Start Date End Date Taking? Authorizing Provider  acetaminophen (TYLENOL) 500 MG tablet Take 500 mg by mouth every 6 (six) hours as needed for moderate pain.   Yes [provider]  albuterol (PROVENTIL HFA;VENTOLIN HFA) 108 (90 Base) MCG/ACT inhaler Inhale 1-2 puffs into the lungs every 4 (four) hours as needed for wheezing or shortness of breath.   Yes [provider]  aspirin EC 81 MG tablet Take 81 mg by mouth every morning.    Yes [provider]  atorvastatin (LIPITOR) 10 MG tablet Take 10 mg by mouth daily.   Yes [provider]  bismuth subsalicylate (PEPTO BISMOL) 262 MG chewable tablet Chew 524 mg by mouth as needed for indigestion or diarrhea or loose stools.    Yes [provider]  cetirizine (ZYRTEC) 10 MG tablet Take 10 mg by mouth daily.   Yes [provider]  fluticasone (FLONASE) 50 MCG/ACT nasal spray Place 1 spray into both nostrils daily as needed for allergies or rhinitis.   Yes [provider]  gabapentin (NEURONTIN) 100 MG capsule Take 100 mg by mouth 3 (three) times daily.    Yes [provider]  ibuprofen (ADVIL,MOTRIN) 600 MG tablet Take 600 mg by mouth every 8 (eight) hours as needed for fever, mild pain or moderate pain.    Yes [provider]  insulin aspart (NOVOLOG) 100 UNIT/ML injection Inject 10 Units into the skin 3 (three) times daily with meals.   Yes [provider]  insulin glargine (LANTUS) 100 UNIT/ML injection Inject 44 Units into the skin at bedtime.    Yes [provider]  meclizine (ANTIVERT) 25 MG tablet Take 25 mg by mouth 3 (three) times daily as needed for dizziness.   Yes [provider]  omeprazole (PRILOSEC) 40 MG capsule Take 40 mg by mouth daily as needed (GERD symptoms).    Yes [provider]      VITAL SIGNS:  Blood pressure (!) 129/91, pulse 80, temperature 98.5 F (36.9 C), temperature  source Oral, resp. rate 20, weight 117.9 kg, SpO2 95 %.  PHYSICAL EXAMINATION:  GENERAL:  58 y.o.-year-old patient lying in the bed with no acute distress.  EYES: Pupils equal, round, reactive to light and accommodation. No scleral icterus. Extraocular muscles intact.  HEENT: Head atraumatic, normocephalic. Oropharynx and nasopharynx clear.  NECK:  Supple, no jugular venous distention. No thyroid enlargement, no tenderness.  LUNGS: Moderate breath sounds bilaterally, crackles no wheezing, rales,rhonchi or crepitation. No use of accessory muscles of respiration.  CARDIOVASCULAR: S1, S2 normal. No murmurs, rubs, or gallops.  ABDOMEN: Soft, nontender, nondistended. Bowel sounds present. No organomegaly or mass.  EXTREMITIES: No pedal edema, cyanosis, or clubbing.  NEUROLOGIC: Cranial nerves II through XII are intact. Muscle strength 5/5 in all extremities. Sensation intact. Gait not  checked.  PSYCHIATRIC: The patient is alert and oriented x 3.  SKIN: No obvious rash, lesion, or ulcer.   LABORATORY PANEL:   CBC Recent Labs  Lab 02/25/19 1225  WBC 6.4  HGB 14.4  HCT 42.3  PLT 157   ------------------------------------------------------------------------------------------------------------------  Chemistries  Recent Labs  Lab 02/25/19 1225 02/25/19 1735  NA 137  --   K 3.9  --   CL 106  --   CO2 23  --   GLUCOSE 125*  --   BUN 11  --   CREATININE 0.89  --   CALCIUM 8.3*  --   AST  --  16  ALT  --  19  ALKPHOS  --  67  BILITOT  --  0.9   ------------------------------------------------------------------------------------------------------------------  Cardiac Enzymes No results for input(s): TROPONINI in the last 168 hours. ------------------------------------------------------------------------------------------------------------------  RADIOLOGY:  Dg Chest 2 View  Result Date: 02/25/2019 CLINICAL DATA:  Chest pain with shortness of breath for 2 days. Recent  hospitalization for urinary tract infection. Diabetes. EXAM: CHEST - 2 VIEW COMPARISON:  Radiographs 04/04/2018.  Abdominal CT 01/30/2019. FINDINGS: There are lower lung volumes. The heart size and mediastinal contours are stable. There are new patchy bibasilar pulmonary opacities. There is no confluent airspace opacity, edema, pleural effusion or pneumothorax. Mild degenerative changes are present in the spine. There are no acute osseous findings. IMPRESSION: New patchy bibasilar pulmonary opacities radiographically most consistent with atelectasis. An early infiltrate in the right middle lobe is difficult to exclude. Electronically Signed   By: Richardean Sale M.D.   On: 02/25/2019 13:02   Ct Angio Chest Pe W And/or Wo Contrast  Result Date: 02/25/2019 CLINICAL DATA:  Chest pain and shortness of breath EXAM: CT ANGIOGRAPHY CHEST WITH CONTRAST TECHNIQUE: Multidetector CT imaging of the chest was performed using the standard protocol during bolus administration of intravenous contrast. Multiplanar CT image reconstructions and MIPs were obtained to evaluate the vascular anatomy. CONTRAST:  25mL OMNIPAQUE IOHEXOL 350 MG/ML SOLN COMPARISON:  April 20, 2016 FINDINGS: Cardiovascular: There are pulmonary emboli arising from the distal most aspect of the right main pulmonary artery extending into the proximal right upper and lower lobe pulmonary artery branches. Smaller pulmonary emboli are also seen in several right lower lobe pulmonary artery branches. There is no appreciable pulmonary embolus on the left. The right ventricle to left ventricle diameter ratio is less than 0.9, not consistent with right heart strain. There is no thoracic aortic aneurysm or dissection. Visualized great vessels appear unremarkable. There is no appreciable pericardial effusion or pericardial thickening. Mediastinum/Nodes: Visualized thyroid appears normal. There are multiple subcentimeter mediastinal lymph nodes. There are no lymph  nodes which meet size criteria for pathologic significance in the thoracic region. Several lymph nodes are slightly larger than was present on the 2017 study, however. No esophageal lesion evident. Lungs/Pleura: There is ground-glass type opacity in both lower lobes involving multiple segments. There is also patchy ground-glass type opacity in each upper lobe and lingular regions. There is no frank airspace consolidation. No pulmonary infarct is demonstrable. Upper Abdomen: Spleen is incompletely visualized although spleen appears enlarged. Spleen measures 14.0 cm from right to left dimension. Visualized upper abdominal structures otherwise appear unremarkable. Musculoskeletal: No blastic or lytic bone lesions. No chest wall lesions evident. Review of the MIP images confirms the above findings. IMPRESSION: 1. Pulmonary emboli arising from the distal most aspect of the right main pulmonary artery extending into proximal right upper and lower lobe branches. Smaller pulmonary  emboli noted in several lower lobe pulmonary artery branches. No right heart strain evident. 2. Multifocal ground-glass type opacity consistent with multifocal pneumonia. No consolidation. Suspect atypical organism pneumonia as most likely. 3. Multiple small lymph nodes which are slightly larger than on prior study from 2017 but do not meet size criteria for pathologic significance. Question reactive etiology to the multifocal pneumonia. 4. Incompletely visualized spleen. Spleen may well be somewhat enlarged. Critical Value/emergent results were called by telephone at the time of interpretation on 02/25/2019 at 2:52 pm to Dr. Marjean Donna , who verbally acknowledged these results. Electronically Signed   By: Lowella Grip III M.D.   On: 02/25/2019 14:54    EKG:   Orders placed or performed during the hospital encounter of 02/25/19  . EKG 12-Lead  . EKG 12-Lead  . ED EKG  . ED EKG    IMPRESSION AND PLAN:    #Multifocal  pneumonia-COVID positive Admit to telemetry Decadron x1 given in the ED continue Solu-Medrol Remdesevir, azithromycin cefepime and vancomycin IV fluids IV Lovenox therapeutic dose Vitamin C, vitamin D, zinc Bronchodilator treatments Check CBC with differential count BMP, lactic acid, procalcitonin, ferritin, LDH, interleukin-6 level, sed rate, CRP Pulmonology consult Check lactic acid and procalcitonin, MRSA PCR can DC vancomycin if MRSA PCR is negative  #Acute pulmonary embolism-possibly from microthrombosis from COVID Lovenox 1 mg/kg subcu every 12 hours Pain meds as needed  #Insulin-dependent diabetes mellitus Sliding scale insulin, carb modified diet, Lantus  #Diabetic neuropathy Neurontin  #GERD PPI   All the records are reviewed and case discussed with ED provider. Management plans discussed with the patient, wife at bedside and they are in agreement.  CODE STATUS: FC   TOTAL TIME TAKING CARE OF THIS PATIENT: 50  minutes.   Note: This dictation was prepared with Dragon dictation along with smaller phrase technology. Any transcriptional errors that result from this process are unintentional.  Nicholes Mango M.D on 02/25/2019 at 8:54 PM  Between 7am to 6pm - Pager - [phone]  After 6pm go to [email or web address] - password [email or web address] [facility]  Office  [phone]  CC: Primary care physician; Letta Median, MD

## 2019-02-25 NOTE — ED Notes (Signed)
Sent rainbow to lab. 

## 2019-02-25 NOTE — Consult Note (Signed)
Pharmacy Antibiotic Note  Michael Gill is a 58 y.o. male admitted on 02/25/2019 with Coronavirus.Pharmacy has been consulted for Remdesivir dosing. Patient was initially expected to transfer to [facility], but hospital is currently full, so will initiate therapy in-patient currently.  Plan: Remdesivir 200mg  IV on Day 1, followed by 100mg  IV on Days 2-5  Weight: 259 lb 14.8 oz (117.9 kg)  Temp (24hrs), Avg:98.5 F (36.9 C), Min:98.5 F (36.9 C), Max:98.5 F (36.9 C)  Recent Labs  Lab 02/25/19 1225  WBC 6.4  CREATININE 0.89    Estimated Creatinine Clearance: 111.5 mL/min (by C-G formula based on SCr of 0.89 mg/dL).    Allergies  Allergen Reactions  . Shellfish Allergy Hives    Swelling of throat and mouth. Betadine is NOT a problem  . Codeine Other (See Comments)    Reaction:  Hallucinations   . Sulfa Antibiotics Hives    Antimicrobials this admission: Remdesivir 7/28 >>   Microbiology results: 7/28 COVID19 test: POSITIVE  Thank you for allowing pharmacy to be a part of this patient's care.  Pearla Dubonnet 02/25/2019 7:34 PM

## 2019-02-26 ENCOUNTER — Encounter: Payer: Self-pay | Admitting: *Deleted

## 2019-02-26 LAB — CBC WITH DIFFERENTIAL/PLATELET
Abs Immature Granulocytes: 0.03 10*3/uL (ref 0.00–0.07)
Basophils Absolute: 0 10*3/uL (ref 0.0–0.1)
Basophils Relative: 0 %
Eosinophils Absolute: 0 10*3/uL (ref 0.0–0.5)
Eosinophils Relative: 0 %
HCT: 41.8 % (ref 39.0–52.0)
Hemoglobin: 13.9 g/dL (ref 13.0–17.0)
Immature Granulocytes: 1 %
Lymphocytes Relative: 13 %
Lymphs Abs: 0.7 10*3/uL (ref 0.7–4.0)
MCH: 29.9 pg (ref 26.0–34.0)
MCHC: 33.3 g/dL (ref 30.0–36.0)
MCV: 89.9 fL (ref 80.0–100.0)
Monocytes Absolute: 0.1 10*3/uL (ref 0.1–1.0)
Monocytes Relative: 2 %
Neutro Abs: 4.4 10*3/uL (ref 1.7–7.7)
Neutrophils Relative %: 84 %
Platelets: 155 10*3/uL (ref 150–400)
RBC: 4.65 MIL/uL (ref 4.22–5.81)
RDW: 12.9 % (ref 11.5–15.5)
WBC: 5.2 10*3/uL (ref 4.0–10.5)
nRBC: 0 % (ref 0.0–0.2)

## 2019-02-26 LAB — BASIC METABOLIC PANEL
Anion gap: 8 (ref 5–15)
BUN: 17 mg/dL (ref 6–20)
CO2: 21 mmol/L — ABNORMAL LOW (ref 22–32)
Calcium: 8 mg/dL — ABNORMAL LOW (ref 8.9–10.3)
Chloride: 106 mmol/L (ref 98–111)
Creatinine, Ser: 0.92 mg/dL (ref 0.61–1.24)
GFR calc Af Amer: >60 mL/min (ref >60)
GFR calc non Af Amer: >60 mL/min (ref >60)
Glucose, Bld: 339 mg/dL — ABNORMAL HIGH (ref 70–99)
Potassium: 4.5 mmol/L (ref 3.5–5.1)
Sodium: 135 mmol/L (ref 135–145)

## 2019-02-26 LAB — MRSA PCR SCREENING: MRSA by PCR: NEGATIVE

## 2019-02-26 LAB — LACTIC ACID, PLASMA: Lactic Acid, Venous: 1.1 mmol/L (ref 0.5–1.9)

## 2019-02-26 LAB — SEDIMENTATION RATE: Sed Rate: 38 mm/hr — ABNORMAL HIGH (ref 0–20)

## 2019-02-26 LAB — LACTATE DEHYDROGENASE: LDH: 167 U/L (ref 98–192)

## 2019-02-26 LAB — FERRITIN: Ferritin: 391 ng/mL — ABNORMAL HIGH (ref 24–336)

## 2019-02-26 MED ORDER — APIXABAN 5 MG PO TABS: 5.0000 mg | ORAL_TABLET | Freq: Two times a day (BID) | ORAL | Status: DC

## 2019-02-26 MED ORDER — TOCILIZUMAB 400 MG/20ML IV SOLN
6.0000 mg/kg | Freq: Once | INTRAVENOUS | Status: AC
  Administered 2019-02-26: 708 mg via INTRAVENOUS
  Filled 2019-02-26: qty 35.4

## 2019-02-26 MED ORDER — INSULIN ASPART 100 UNIT/ML ~~LOC~~ SOLN
0.0000 [IU] | Freq: Every day | SUBCUTANEOUS | Status: DC
  Administered 2019-02-26: 2 [IU] via SUBCUTANEOUS
  Filled 2019-02-26: qty 1

## 2019-02-26 MED ORDER — INSULIN ASPART 100 UNIT/ML ~~LOC~~ SOLN: 0.0000 [IU] | Freq: Three times a day (TID) | SUBCUTANEOUS | Status: DC

## 2019-02-26 MED ORDER — INSULIN ASPART 100 UNIT/ML ~~LOC~~ SOLN
0.0000 [IU] | Freq: Three times a day (TID) | SUBCUTANEOUS | Status: DC
  Administered 2019-02-26: 3 [IU] via SUBCUTANEOUS
  Administered 2019-02-27: 5 [IU] via SUBCUTANEOUS
  Administered 2019-02-27: 3 [IU] via SUBCUTANEOUS
  Filled 2019-02-26 (×3): qty 1

## 2019-02-26 MED ORDER — INSULIN GLARGINE 100 UNIT/ML ~~LOC~~ SOLN
44.0000 [IU] | Freq: Every day | SUBCUTANEOUS | Status: DC
  Administered 2019-02-26: 44 [IU] via SUBCUTANEOUS
  Filled 2019-02-26 (×3): qty 0.44

## 2019-02-26 MED ORDER — APIXABAN 5 MG PO TABS
10.0000 mg | ORAL_TABLET | Freq: Two times a day (BID) | ORAL | Status: DC
  Administered 2019-02-26 – 2019-02-27 (×2): 10 mg via ORAL
  Filled 2019-02-26 (×2): qty 2

## 2019-02-26 MED ORDER — VANCOMYCIN HCL 10 G IV SOLR
1250.0000 mg | Freq: Two times a day (BID) | INTRAVENOUS | Status: DC
  Filled 2019-02-26: qty 1250

## 2019-02-26 MED ORDER — VANCOMYCIN HCL 1.25 G IV SOLR
1250.0000 mg | Freq: Two times a day (BID) | INTRAVENOUS | Status: DC
  Filled 2019-02-26 (×2): qty 1250

## 2019-02-26 NOTE — Progress Notes (Signed)
Patient transferred from The ER into negative pressure room following positive COVID swab. On admission to the unit patient was A&O X4, and was able to transfer to his independently from stretcher to bed. Patient was orineted to his room, patient was educcated about universal use of face mask. Care plan was reviewed with patient and admission documentation completed

## 2019-02-26 NOTE — Consult Note (Signed)
Pulmonary Medicine          Date: 02/26/2019,   MRN# [id number] Michael Gill Sep 27, 1960     AdmissionWeight: 117.9 kg                 CurrentWeight: 117.9 kg      CHIEF COMPLAINT:   COVID19 induced bilateral Pneumonia with acute hypoxemic respiratory failure    HISTORY OF PRESENT ILLNESS   This is a pleasant 58 year old male with a history of asthma, CAD, colon CA, conversion disorder, obstructive sleep apnea, previous history of DVT, history of CVA diabetes seizure disorder who came in with chest discomfort and shortness of breath.  Patient was found to be positive for COVID-19 infection.  Patient did have CT chest with contrast which showed multifocal pneumonia and pulmonary embolism bilaterally.  Since admission he was initially on supplemental oxygen at 2 L/min with subsequent weaning down to room air.  Thus far he has been treated with dexamethasone, limited as severe loading dose 200 mg followed by 100 IV daily, empiric antibiotics for possible concomitant bacterial component, vitamin C and zinc, and has significantly improved.  Consultation to pulmonary was placed by hospitalist service to evaluate for  any additional recommendations for COVID-19 infection   PAST MEDICAL HISTORY   Past Medical History:  Diagnosis Date   Asthma    B12 deficiency    in the past   CAD (coronary artery disease)    s/p MI   Colon cancer ([place]) 5597   Complication of anesthesia    long time to wake up from colonoscopy.   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   GERD (gastroesophageal reflux disease)    History of kidney stones 1990   Hx of therapeutic radiation     Hyperlipidemia    Myocardial infarction ([name]) 1990   Neuropathic pain, leg    Pseudoseizure    last episode 2/19.[name] HA, slurry speach, eyes twirl, frothy at mouth, tremors of hand.   Seizure ([name])    last episode 08/2017. has no warning, will just happen. 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    difficulty forming words at times   Stroke [facility]) 2012, 03/2018   residual tremor left hand, facial droop   Tremors of nervous system    specifically left hand, since stroke     SURGICAL HISTORY   Past Surgical History:  Procedure Laterality Date   APPENDECTOMY  1971   CARDIAC CATHETERIZATION Left 10/12/2015   Procedure: Left Heart Cath and Coronary Angiography;  Surgeon: Yolonda Kida, MD;  Location: [facility];  Service: Cardiovascular;  Laterality: Left;   TONSILLECTOMY       FAMILY HISTORY   Family History  Problem Relation Age of Onset   Alzheimer's disease Mother    Cancer Brother      SOCIAL HISTORY   Social History   Tobacco Use   Smoking status: Never Smoker   Smokeless tobacco: Never Used  Substance Use Topics   Alcohol use: No  Drug use: No     MEDICATIONS    Home Medication:    Current Medication:  Current Facility-Administered Medications:    acetaminophen (TYLENOL) tablet 500 mg, 500 mg, Oral, Q6H PRN, Gouru, Aruna, MD   albuterol (PROVENTIL) (2.5 MG/3ML) 0.083% nebulizer solution 2.5 mg, 2.5 mg, Nebulization, Q4H PRN, Gouru, Aruna, MD   atorvastatin (LIPITOR) tablet 10 mg, 10 mg, Oral, Daily, Gouru, Aruna, MD, 10 mg at 02/25/19 2251   azithromycin (ZITHROMAX) 500 mg in sodium chloride 0.9 % 250 mL IVPB, 500 mg, Intravenous, Q24H, Gouru, Aruna, MD, Stopped at 02/25/19 7017   bismuth subsalicylate (PEPTO BISMOL) chewable tablet 524 mg, 524 mg, Oral, PRN, Gouru, Aruna, MD   ceFEPIme (MAXIPIME) 2 g in sodium chloride 0.9 % 100 mL  IVPB, 2 g, Intravenous, Q8H, Gouru, Aruna, MD, Last Rate: 200 mL/hr at 02/26/19 0640, 2 g at 02/26/19 0640   cholecalciferol (VITAMIN D3) tablet 1,000 Units, 1,000 Units, Oral, Daily, Gouru, Aruna, MD, 1,000 Units at 02/26/19 1233   dexamethasone (DECADRON) tablet 8 mg, 8 mg, Oral, Daily, Gouru, Aruna, MD, 8 mg at 02/26/19 0913   docusate sodium (COLACE) capsule 100 mg, 100 mg, Oral, BID PRN, Gouru, Aruna, MD   enoxaparin (LOVENOX) injection 120 mg, 1 mg/kg, Subcutaneous, Q12H, Gouru, Aruna, MD, 120 mg at 02/26/19 1234   fluticasone (FLONASE) 50 MCG/ACT nasal spray 1 spray, 1 spray, Each Nare, Daily PRN, Gouru, Aruna, MD   gabapentin (NEURONTIN) capsule 100 mg, 100 mg, Oral, TID, Gouru, Aruna, MD, 100 mg at 02/26/19 1233   insulin aspart (novoLOG) injection 0-5 Units, 0-5 Units, Subcutaneous, QHS, Pyreddy, Pavan, MD   insulin aspart (novoLOG) injection 0-9 Units, 0-9 Units, Subcutaneous, TID WC, Pyreddy, Pavan, MD   insulin aspart (novoLOG) injection 10 Units, 10 Units, Subcutaneous, TID WC, Pyreddy, Pavan, MD, 10 Units at 02/26/19 1233   insulin glargine (LANTUS) injection 44 Units, 44 Units, Subcutaneous, QHS, Pyreddy, Pavan, MD   loratadine (CLARITIN) tablet 10 mg, 10 mg, Oral, Daily, Gouru, Aruna, MD   meclizine (ANTIVERT) tablet 25 mg, 25 mg, Oral, TID PRN, Gouru, Aruna, MD   morphine 2 MG/ML injection 2 mg, 2 mg, Intravenous, Q4H PRN, Gouru, Aruna, MD, 2 mg at 02/26/19 0410   ondansetron (ZOFRAN) tablet 4 mg, 4 mg, Oral, Q6H PRN **OR** ondansetron (ZOFRAN) injection 4 mg, 4 mg, Intravenous, Q6H PRN, Gouru, Aruna, MD   pantoprazole (PROTONIX) EC tablet 40 mg, 40 mg, Oral, Daily, Gouru, Aruna, MD, 40 mg at 02/26/19 0913   [COMPLETED] remdesivir 200 mg in sodium chloride 0.9 % 250 mL IVPB, 200 mg, Intravenous, Once, Stopped at 02/26/19 0252 **FOLLOWED BY** [START ON 02/27/2019] remdesivir 100 mg in sodium chloride 0.9 % 250 mL IVPB, 100 mg, Intravenous, Q24H, Nazari, Walid A, RPH    traMADol (ULTRAM) tablet 50 mg, 50 mg, Oral, Q6H PRN, Gouru, Aruna, MD, 50 mg at 02/26/19 7939   vitamin C (ASCORBIC ACID) tablet 500 mg, 500 mg, Oral, BID, Gouru, Aruna, MD, 500 mg at 02/26/19 0300   zinc sulfate capsule 220 mg, 220 mg, Oral, Daily, Gouru, Aruna, MD, 220 mg at 02/26/19 1233    ALLERGIES   Shellfish allergy, Codeine, and Sulfa antibiotics     REVIEW OF SYSTEMS    Review of Systems:  Gen:  Denies  fever, sweats, chills weigh loss  HEENT: Denies blurred vision, double vision, ear pain, eye pain, hearing loss, nose bleeds, sore throat Cardiac:  No dizziness, chest pain or heaviness, chest tightness,edema Resp:   Reports  shortness of breath shortness of breath,wheezing, hemoptysis,  Gi: Denies swallowing difficulty, stomach pain, nausea or vomiting, diarrhea, constipation, bowel incontinence Gu:  Denies bladder incontinence, burning urine Ext:   Denies Joint pain, stiffness or swelling Skin: Denies  skin rash, easy bruising or bleeding or hives Endoc:  Denies polyuria, polydipsia , polyphagia or weight change Psych:   Denies depression, insomnia or hallucinations   Other:  All other systems negative   VS: BP 137/90 (BP Location: Left Arm)    Pulse 90    Temp 98.5 F (36.9 C) (Oral)    Resp 18    Ht 5\' 8"  (1.727 m)    Wt 117.9 kg    SpO2 97%    BMI 39.52 kg/m      PHYSICAL EXAM    COVID-19 DISASTER DECLARATION:   FULL CONTACT PHYSICAL EXAMINATION WAS NOT POSSIBLE DUE TO TREATMENT OF COVID-19  AND CONSERVATION OF PERSONAL PROTECTIVE EQUIPMENT, LIMITED EXAM FINDINGS INCLUDE-    Patient assessed or the symptoms described in the history of present illness.  In the context of the Global COVID-19 pandemic, which necessitated consideration that the patient might be at risk for infection with the SARS-CoV-2 virus that causes COVID-19, Institutional protocols and algorithms that pertain to the evaluation of patients at risk for COVID-19 are in a state of rapid  change based on information released by regulatory bodies including the CDC and federal and state organizations. These policies and algorithms were followed during the patient's care while in hospital.  -Patient is no apparent distress -He is able to speak in full sentences and is smiling during interview with hospitalist -He is able to move all 4 extremities -He is hemodynamically stable    IMAGING    Dg Chest 2 View  Result Date: 02/25/2019 CLINICAL DATA:  Chest pain with shortness of breath for 2 days. Recent hospitalization for urinary tract infection. Diabetes. EXAM: CHEST - 2 VIEW COMPARISON:  Radiographs 04/04/2018.  Abdominal CT 01/30/2019. FINDINGS: There are lower lung volumes. The heart size and mediastinal contours are stable. There are new patchy bibasilar pulmonary opacities. There is no confluent airspace opacity, edema, pleural effusion or pneumothorax. Mild degenerative changes are present in the spine. There are no acute osseous findings. IMPRESSION: New patchy bibasilar pulmonary opacities radiographically most consistent with atelectasis. An early infiltrate in the right middle lobe is difficult to exclude. Electronically Signed   By: Richardean Sale M.D.   On: 02/25/2019 13:02   Ct Angio Chest Pe W And/or Wo Contrast  Result Date: 02/25/2019 CLINICAL DATA:  Chest pain and shortness of breath EXAM: CT ANGIOGRAPHY CHEST WITH CONTRAST TECHNIQUE: Multidetector CT imaging of the chest was performed using the standard protocol during bolus administration of intravenous contrast. Multiplanar CT image reconstructions and MIPs were obtained to evaluate the vascular anatomy. CONTRAST:  51mL OMNIPAQUE IOHEXOL 350 MG/ML SOLN COMPARISON:  April 20, 2016 FINDINGS: Cardiovascular: There are pulmonary emboli arising from the distal most aspect of the right main pulmonary artery extending into the proximal right upper and lower lobe pulmonary artery branches. Smaller pulmonary emboli are  also seen in several right lower lobe pulmonary artery branches. There is no appreciable pulmonary embolus on the left. The right ventricle to left ventricle diameter ratio is less than 0.9, not consistent with right heart strain. There is no thoracic aortic aneurysm or dissection. Visualized great vessels appear unremarkable. There is no appreciable pericardial effusion or pericardial thickening. Mediastinum/Nodes: Visualized thyroid appears normal. There are multiple subcentimeter mediastinal  lymph nodes. There are no lymph nodes which meet size criteria for pathologic significance in the thoracic region. Several lymph nodes are slightly larger than was present on the 2017 study, however. No esophageal lesion evident. Lungs/Pleura: There is ground-glass type opacity in both lower lobes involving multiple segments. There is also patchy ground-glass type opacity in each upper lobe and lingular regions. There is no frank airspace consolidation. No pulmonary infarct is demonstrable. Upper Abdomen: Spleen is incompletely visualized although spleen appears enlarged. Spleen measures 14.0 cm from right to left dimension. Visualized upper abdominal structures otherwise appear unremarkable. Musculoskeletal: No blastic or lytic bone lesions. No chest wall lesions evident. Review of the MIP images confirms the above findings. IMPRESSION: 1. Pulmonary emboli arising from the distal most aspect of the right main pulmonary artery extending into proximal right upper and lower lobe branches. Smaller pulmonary emboli noted in several lower lobe pulmonary artery branches. No right heart strain evident. 2. Multifocal ground-glass type opacity consistent with multifocal pneumonia. No consolidation. Suspect atypical organism pneumonia as most likely. 3. Multiple small lymph nodes which are slightly larger than on prior study from 2017 but do not meet size criteria for pathologic significance. Question reactive etiology to the  multifocal pneumonia. 4. Incompletely visualized spleen. Spleen may well be somewhat enlarged. Critical Value/emergent results were called by telephone at the time of interpretation on 02/25/2019 at 2:52 pm to Dr. Marjean Donna , who verbally acknowledged these results. Electronically Signed   By: Lowella Grip III M.D.   On: 02/25/2019 14:54   Ct Renal Stone Study  Result Date: 01/30/2019 CLINICAL DATA:  58 y/o M; Flank pain, stone disease suspected acute urinary retention and acute kidney injury, hematuria, probable UTI vs pyelonephritis, prostatitis also very likely. EXAM: CT ABDOMEN AND PELVIS WITHOUT CONTRAST TECHNIQUE: Multidetector CT imaging of the abdomen and pelvis was performed following the standard protocol without IV contrast. COMPARISON:  10/15/2016 CT abdomen and pelvis. FINDINGS: Lower chest: No acute abnormality. Hepatobiliary: No focal liver abnormality is seen. No gallstones, gallbladder wall thickening, or biliary dilatation. Pancreas: Unremarkable. No pancreatic ductal dilatation or surrounding inflammatory changes. Spleen: Normal in size without focal abnormality. Adrenals/Urinary Tract: Adrenal glands are unremarkable. Kidneys are normal, without renal calculi, focal lesion, or hydronephrosis. Bladder is collapsed around a Foley catheter. Stomach/Bowel: Stomach is within normal limits. Appendix not identified. Wall thickening of the anterior rectum at the level of prostate. Otherwise no evidence of bowel wall thickening, distention, or inflammatory changes. Mild sigmoid diverticulosis. Vascular/Lymphatic: Aortic atherosclerosis. No enlarged abdominal or pelvic lymph nodes. Reproductive: Prostate is enlarged and low in attenuation and there is surrounding edema within the pelvis as well as in the adjacent anterior wall of the rectum. No discrete fluid collection is identified. Other: Small paraumbilical hernia containing fat. Musculoskeletal: No fracture is seen. Calcified L4-5 disc bulge  encroaching on the neural foramen and resulting in at least mild spinal canal stenosis. IMPRESSION: 1. Enlarged hypodense prostate with surrounding edema in the pelvis as well as wall thickening of adjacent anterior rectum. Findings likely represent acute prostatitis. No discrete abscess identified. 2.  Aortic Atherosclerosis (ICD10-I70.0). 3. Small paraumbilical hernia containing fat. 4. Calcified L4-5 disc bulge encroaching on the neural foramen and resulting in at least mild spinal canal stenosis. Electronically Signed   By: Kristine Garbe M.D.   On: 01/30/2019 02:44      ASSESSMENT/PLAN     Acute hypoxemic respiratory failure due to COVID19 pneumonia - Remdesevir 100mg  iv daily  -Dexamethasone 8mg   po daily - tylenol PRN - Actemra 6mg /kg IV x 1  - supplemental O2 as needed to keep SpO2>90% -close monitoring for worsening spO2  -patient clinically improved since admission     Bilateral pulmonary thromboembolism  -  Has been on therapeutic lovenox  - will be transitioning to Eliquis today      Obstructive sleep apnea   - may increase O2 nocturnally or patient may bring his own CPAP device to use while inpatient.   Thank you for allowing me to participate in the care of this patient.      Patient/Family are satisfied with care plan and all questions have been answered.  This document was prepared using Dragon voice recognition software and may include unintentional dictation errors.     Ottie Glazier, M.D.  Division of [facility]

## 2019-02-26 NOTE — Progress Notes (Signed)
Pharmacy Antibiotic Note  Michael Gill is a 58 y.o. male admitted on 02/25/2019 with c/o SOB and CP found to have PE on CT w/ bilateral ground-glass opacities suspected to be from an atypical pneumonia. Patient found to have a positive SARS-CoV2 nasal swab w/ elevated D-dimer, CRP & ferritin. Pharmacy has been consulted for vanc/cefepime and has also been started on remdesivir for a 5 days course for SARS-CoV2 treatment dosing.  Plan: Patient received vanc 2g IV load, cefepime 2g, and azithromycin 500 mg IV x 1 in ED  Vanc/cefepime/azithromycin was ordered since patient may have a super-imposed atypical pneumonia s/t COVID w/ risk of hospital acquired PNA d/t admission back on 01/30/19 and risk of MDR organisms s/t IV abx within the past 90 days.  Vancomycin 1250 mg IV Q 12 hrs. Goal AUC 400-550. Expected AUC: 557.0 SCr used: 0.89  Cssmin: 15.9  Will continue cefepime 2g IV q8h and azithromycin 500 mg IV daily (QTc on EKG 454 msec WNL). Patient received remdesivir 200 mg IV load on 07/29 @ 0219 and will continue remdesivir 100 mg IV for another 4 days for a total duration of 5 days treatment. Will continue to monitor CMP to ensure normalization of LFTs and will continue to monitor s/sx of infx and will de-escalate abx therapy as necessary.  Height: 5\' 8"  (172.7 cm) Weight: 259 lb 14.8 oz (117.9 kg) IBW/kg (Calculated) : 68.4  Temp (24hrs), Avg:98.4 F (36.9 C), Min:98.3 F (36.8 C), Max:98.5 F (36.9 C)  Recent Labs  Lab 02/25/19 1225 02/25/19 2131 02/26/19 0002  WBC 6.4  --   --   CREATININE 0.89  --   --   LATICACIDVEN  --  0.9 1.1    Estimated Creatinine Clearance: 111.5 mL/min (by C-G formula based on SCr of 0.89 mg/dL).    Allergies  Allergen Reactions  . Shellfish Allergy Hives    Swelling of throat and mouth. Betadine is NOT a problem  . Codeine Other (See Comments)    Reaction:  Hallucinations   . Sulfa Antibiotics Hives    Thank you for allowing pharmacy to be a  part of this patient's care.  Tobie Lords, PharmD, BCPS Clinical Pharmacist 02/26/2019 2:40 AM

## 2019-02-26 NOTE — Plan of Care (Signed)
  Problem: Clinical Measurements: Goal: Will remain free from infection Outcome: Progressing Goal: Respiratory complications will improve Outcome: Progressing   Problem: Activity: Goal: Risk for activity intolerance will decrease Outcome: Progressing   

## 2019-02-26 NOTE — Consult Note (Signed)
ANTICOAGULATION CONSULT NOTE - Initial Consult  Pharmacy Consult for Apixaban Dosing Indication: DVT  Allergies  Allergen Reactions  . Shellfish Allergy Hives    Swelling of throat and mouth. Betadine is NOT a problem  . Codeine Other (See Comments)    Reaction:  Hallucinations   . Sulfa Antibiotics Hives    Patient Measurements: Height: 5\' 8"  (172.7 cm) Weight: 259 lb 14.8 oz (117.9 kg) IBW/kg (Calculated) : 68.4 Heparin Dosing Weight: N/A  Vital Signs: Temp: 98.5 F (36.9 C) (07/29 0909) Temp Source: Oral (07/29 0909) BP: 137/90 (07/29 0909) Pulse Rate: 90 (07/29 0909)  Labs: Recent Labs    02/25/19 1225 02/25/19 1450 02/25/19 1458 02/26/19 0353  HGB 14.4  --   --  13.9  HCT 42.3  --   --  41.8  PLT 157  --   --  155  APTT  --   --  29  --   LABPROT  --   --  12.7  --   INR  --   --  1.0  --   CREATININE 0.89  --   --  0.92  TROPONINIHS 3 4  --   --     Estimated Creatinine Clearance: 107.9 mL/min (by C-G formula based on SCr of 0.92 mg/dL).   Medical History: Past Medical History:  Diagnosis Date  . Asthma   . B12 deficiency    in the past  . CAD (coronary artery disease)    s/p MI  . Colon cancer ([place]) 1990  . Complication of anesthesia    long time to wake up from colonoscopy.  . Conversion disorder    difficulty with visual disturbances and changes with unknown etiology, along with headaches  . CVA (cerebral infarction)   . 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 DVT (deep venous thrombosis) ([place]) 2016   behind right knee  . Dyspnea 09/2018   with exercise  . Dysrhythmia    has had Afib in past.  . GERD (gastroesophageal reflux disease)   . History of kidney stones 1990  . Hx of therapeutic radiation   . Hyperlipidemia   . Myocardial infarction ([place]) 1990  . Neuropathic pain, leg   . 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  . Seizure ([place])    last  episode 08/2017. has no warning, will just happen. nothing specific precipitates event  . Sleep apnea    does not use cpap  . 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 Stroke [facility]) 2012, 03/2018   residual tremor left hand, facial droop  . Tremors of nervous system    specifically left hand, since stroke    Medications:  Medications Prior to Admission  Medication Sig Dispense Refill Last Dose  . acetaminophen (TYLENOL) 500 MG tablet Take 500 mg by mouth every 6 (six) hours as needed for moderate pain.   Unknown at PRN  . albuterol (PROVENTIL HFA;VENTOLIN HFA) 108 (90 Base) MCG/ACT inhaler Inhale 1-2 puffs into the lungs every 4 (four) hours as needed for wheezing or shortness of breath.   Unknown at PRN  . aspirin EC 81 MG tablet Take 81 mg by mouth every morning.    02/25/2019 at 0600  . atorvastatin (LIPITOR) 10 MG tablet Take 10 mg by mouth daily.   02/24/2019 at Unknown time  . bismuth  subsalicylate (PEPTO BISMOL) 262 MG chewable tablet Chew 524 mg by mouth as needed for indigestion or diarrhea or loose stools.    Unknown at PRN  . cetirizine (ZYRTEC) 10 MG tablet Take 10 mg by mouth daily.   Past Week at Unknown time  . fluticasone (FLONASE) 50 MCG/ACT nasal spray Place 1 spray into both nostrils daily as needed for allergies or rhinitis.   Unknown at PRN  . gabapentin (NEURONTIN) 100 MG capsule Take 100 mg by mouth 3 (three) times daily.    02/25/2019 at 0600  . ibuprofen (ADVIL,MOTRIN) 600 MG tablet Take 600 mg by mouth every 8 (eight) hours as needed for fever, mild pain or moderate pain.    Unknown at PRN  . insulin aspart (NOVOLOG) 100 UNIT/ML injection Inject 10 Units into the skin 3 (three) times daily with meals.   02/25/2019 at 0600  . insulin glargine (LANTUS) 100 UNIT/ML injection Inject 44 Units into the skin at bedtime.    02/24/2019 at Unknown time  . meclizine (ANTIVERT) 25 MG tablet Take 25 mg by mouth 3 (three) times daily as needed  for dizziness.   Unknown at PRN  . omeprazole (PRILOSEC) 40 MG capsule Take 40 mg by mouth daily as needed (GERD symptoms).    Unknown at PRN   Scheduled:  . apixaban  10 mg Oral BID   Followed by  . [START ON 03/02/2019] apixaban  5 mg Oral BID  . atorvastatin  10 mg Oral Daily  . cholecalciferol  1,000 Units Oral Daily  . dexamethasone  8 mg Oral Daily  . gabapentin  100 mg Oral TID  . insulin aspart  0-5 Units Subcutaneous QHS  . insulin aspart  0-9 Units Subcutaneous TID WC  . insulin aspart  10 Units Subcutaneous TID WC  . insulin glargine  44 Units Subcutaneous QHS  . loratadine  10 mg Oral Daily  . pantoprazole  40 mg Oral Daily  . vitamin C  500 mg Oral BID  . zinc sulfate  220 mg Oral Daily   Infusions:  . azithromycin Stopped (02/25/19 2130)  . ceFEPime (MAXIPIME) IV 2 g (02/26/19 0640)  . [START ON 02/27/2019] remdesivir 100 mg in NS 250 mL    . tocilizumab (ACTEMRA) IV 708 mg (02/26/19 1524)   PRN:  Anti-infectives (From admission, onward)   Start     Dose/Rate Route Frequency Ordered Stop   02/27/19 0200  remdesivir 100 mg in sodium chloride 0.9 % 250 mL IVPB     100 mg 500 mL/hr over 30 Minutes Intravenous Every 24 hours 02/25/19 1934 03/03/19 0159   02/26/19 1200  vancomycin (VANCOCIN) 1,250 mg in sodium chloride 0.9 % 250 mL IVPB  Status:  Discontinued     1,250 mg 166.7 mL/hr over 90 Minutes Intravenous Every 12 hours 02/26/19 0309 02/26/19 1157   02/26/19 1100  vancomycin (VANCOCIN) 1,250 mg in sodium chloride 0.9 % 250 mL IVPB  Status:  Discontinued     1,250 mg 166.7 mL/hr over 90 Minutes Intravenous Every 12 hours 02/26/19 0237 02/26/19 0309   02/25/19 2300  remdesivir 200 mg in sodium chloride 0.9 % 250 mL IVPB     200 mg 500 mL/hr over 30 Minutes Intravenous Once 02/25/19 1934 02/26/19 0252   02/25/19 2200  vancomycin (VANCOCIN) 2,000 mg in sodium chloride 0.9 % 500 mL IVPB     2,000 mg 250 mL/hr over 120 Minutes Intravenous  Once 02/25/19 2150 02/26/19  0110  02/25/19 2200  ceFEPIme (MAXIPIME) 2 g in sodium chloride 0.9 % 100 mL IVPB     2 g 200 mL/hr over 30 Minutes Intravenous Every 8 hours 02/25/19 2150     02/25/19 1930  azithromycin (ZITHROMAX) 500 mg in sodium chloride 0.9 % 250 mL IVPB     500 mg 250 mL/hr over 60 Minutes Intravenous Every 24 hours 02/25/19 1921 03/02/19 2359      Assessment: Pharmacy has been consulted to dose Apixaban for patient with bilateral pulmonary thromboemboli. Patient is COVID19 positive via nasal swab and was initially placed on therapeutic Lovenox dosing for PE. Per pulmonologist, patient stable to transition to PO Apixaban VTE treatment dosing.  Hgb and platelets have been stable. Will initiate Apixaban this evening at 2200, approximately 2 hours before the patient's next lovenox injection was scheduled.  Goal of Therapy:  Monitor platelets by anticoagulation protocol: Yes   Plan:  Apixaban 10mg  PO BID x 7 days, followed by 5mg  PO BID until further notice.   Will monitor levels with AM labs tomorrow.  Janeann Paisley A Blayne Garlick 02/26/2019,3:32 PM

## 2019-02-26 NOTE — Progress Notes (Signed)
Michael Gill at [facility] NAME: Michael Gill    MR#:  [id number]  DATE OF BIRTH:  09-09-1960  SUBJECTIVE:  CHIEF COMPLAINT:   Chief Complaint  Patient presents with  . Shortness of Breath  . Chest Pain  Patient seen and evaluated today Decreased shortness of breath Improved cough Weaned off oxygen No chest pain No fever COVID-19 test positive  REVIEW OF SYSTEMS:    ROS  CONSTITUTIONAL: No documented fever. Has fatigue, weakness. No weight gain, no weight loss.  EYES: No blurry or double vision.  ENT: No tinnitus. No postnasal drip. No redness of the oropharynx.  RESPIRATORY: Has cough, no wheeze, no hemoptysis. Decreased dyspnea.  CARDIOVASCULAR: No chest pain. No orthopnea. No palpitations. No syncope.  GASTROINTESTINAL: No nausea, no vomiting or diarrhea. No abdominal pain. No melena or hematochezia.  GENITOURINARY: No dysuria or hematuria.  ENDOCRINE: No polyuria or nocturia. No heat or cold intolerance.  HEMATOLOGY: No anemia. No bruising. No bleeding.  INTEGUMENTARY: No rashes. No lesions.  MUSCULOSKELETAL: No arthritis. No swelling. No gout.  NEUROLOGIC: No numbness, tingling, or ataxia. No seizure-type activity.  PSYCHIATRIC: No anxiety. No insomnia. No ADD.   DRUG ALLERGIES:   Allergies  Allergen Reactions  . Shellfish Allergy Hives    Swelling of throat and mouth. Betadine is NOT a problem  . Codeine Other (See Comments)    Reaction:  Hallucinations   . Sulfa Antibiotics Hives    VITALS:  Blood pressure 137/90, pulse 90, temperature 98.5 F (36.9 C), temperature source Oral, resp. rate 18, height 5\' 8"  (1.727 m), weight 117.9 kg, SpO2 97 %.  PHYSICAL EXAMINATION:   Physical Exam  GENERAL:  58 y.o.-year-old patient lying in the bed with no acute distress.  EYES: Pupils equal, round, reactive to light and accommodation. No scleral icterus. Extraocular muscles intact.  HEENT: Head atraumatic, normocephalic.  Oropharynx and nasopharynx clear.  NECK:  Supple, no jugular venous distention. No thyroid enlargement, no tenderness.  LUNGS: Improved breath sounds bilaterally, scattered rales in both lungs. No use of accessory muscles of respiration.  CARDIOVASCULAR: S1, S2 normal. No murmurs, rubs, or gallops.  ABDOMEN: Soft, nontender, nondistended. Bowel sounds present. No organomegaly or mass.  EXTREMITIES: No cyanosis, clubbing or edema b/l.    NEUROLOGIC: Cranial nerves II through XII are intact. No focal Motor or sensory deficits b/l.   PSYCHIATRIC: The patient is alert and oriented x 3.  SKIN: No obvious rash, lesion, or ulcer.   LABORATORY PANEL:   CBC Recent Labs  Lab 02/26/19 0353  WBC 5.2  HGB 13.9  HCT 41.8  PLT 155   ------------------------------------------------------------------------------------------------------------------ Chemistries  Recent Labs  Lab 02/25/19 1735 02/26/19 0353  NA  --  135  K  --  4.5  CL  --  106  CO2  --  21*  GLUCOSE  --  339*  BUN  --  17  CREATININE  --  0.92  CALCIUM  --  8.0*  AST 16  --   ALT 19  --   ALKPHOS 67  --   BILITOT 0.9  --    ------------------------------------------------------------------------------------------------------------------  Cardiac Enzymes No results for input(s): TROPONINI in the last 168 hours. ------------------------------------------------------------------------------------------------------------------  RADIOLOGY:  Dg Chest 2 View  Result Date: 02/25/2019 CLINICAL DATA:  Chest pain with shortness of breath for 2 days. Recent hospitalization for urinary tract infection. Diabetes. EXAM: CHEST - 2 VIEW COMPARISON:  Radiographs 04/04/2018.  Abdominal CT 01/30/2019. FINDINGS: There  are lower lung volumes. The heart size and mediastinal contours are stable. There are new patchy bibasilar pulmonary opacities. There is no confluent airspace opacity, edema, pleural effusion or pneumothorax. Mild degenerative  changes are present in the spine. There are no acute osseous findings. IMPRESSION: New patchy bibasilar pulmonary opacities radiographically most consistent with atelectasis. An early infiltrate in the right middle lobe is difficult to exclude. Electronically Signed   By: Richardean Sale M.D.   On: 02/25/2019 13:02   Ct Angio Chest Pe W And/or Wo Contrast  Result Date: 02/25/2019 CLINICAL DATA:  Chest pain and shortness of breath EXAM: CT ANGIOGRAPHY CHEST WITH CONTRAST TECHNIQUE: Multidetector CT imaging of the chest was performed using the standard protocol during bolus administration of intravenous contrast. Multiplanar CT image reconstructions and MIPs were obtained to evaluate the vascular anatomy. CONTRAST:  67mL OMNIPAQUE IOHEXOL 350 MG/ML SOLN COMPARISON:  April 20, 2016 FINDINGS: Cardiovascular: There are pulmonary emboli arising from the distal most aspect of the right main pulmonary artery extending into the proximal right upper and lower lobe pulmonary artery branches. Smaller pulmonary emboli are also seen in several right lower lobe pulmonary artery branches. There is no appreciable pulmonary embolus on the left. The right ventricle to left ventricle diameter ratio is less than 0.9, not consistent with right heart strain. There is no thoracic aortic aneurysm or dissection. Visualized great vessels appear unremarkable. There is no appreciable pericardial effusion or pericardial thickening. Mediastinum/Nodes: Visualized thyroid appears normal. There are multiple subcentimeter mediastinal lymph nodes. There are no lymph nodes which meet size criteria for pathologic significance in the thoracic region. Several lymph nodes are slightly larger than was present on the 2017 study, however. No esophageal lesion evident. Lungs/Pleura: There is ground-glass type opacity in both lower lobes involving multiple segments. There is also patchy ground-glass type opacity in each upper lobe and lingular regions.  There is no frank airspace consolidation. No pulmonary infarct is demonstrable. Upper Abdomen: Spleen is incompletely visualized although spleen appears enlarged. Spleen measures 14.0 cm from right to left dimension. Visualized upper abdominal structures otherwise appear unremarkable. Musculoskeletal: No blastic or lytic bone lesions. No chest wall lesions evident. Review of the MIP images confirms the above findings. IMPRESSION: 1. Pulmonary emboli arising from the distal most aspect of the right main pulmonary artery extending into proximal right upper and lower lobe branches. Smaller pulmonary emboli noted in several lower lobe pulmonary artery branches. No right heart strain evident. 2. Multifocal ground-glass type opacity consistent with multifocal pneumonia. No consolidation. Suspect atypical organism pneumonia as most likely. 3. Multiple small lymph nodes which are slightly larger than on prior study from 2017 but do not meet size criteria for pathologic significance. Question reactive etiology to the multifocal pneumonia. 4. Incompletely visualized spleen. Spleen may well be somewhat enlarged. Critical Value/emergent results were called by telephone at the time of interpretation on 02/25/2019 at 2:52 pm to Dr. Marjean Donna , who verbally acknowledged these results. Electronically Signed   By: Lowella Grip III M.D.   On: 02/25/2019 14:54     ASSESSMENT AND PLAN:   58 year old male patient with history of DVT, diabetes mellitus insulin requiring, TIA, atrial fibrillation not on any anticoagulation, colon cancer, coronary artery disease, CVA, GERD currently under hospitalist service for shortness of breath  -Acute respiratory distress with hypoxia Improving Wean oxygen  -Multifocal pneumonia Most probably viral from coronavirus infection Continue IV cefepime antibiotic for now MRSA PCR negative Vancomycin discontinued IV Decadron Continue IV remdesevir Add  Tocluzimab Follow-up cultures and  lactic acid levels Bronchodilator therapy  -Acute pulmonary embolism Probably from microthrombosis from COVID-19 infection Full dose Lovenox subcutaneously to continue Transition to [place] consult  -Diabetes mellitus insulin requiring Diabetic diet with sliding scale coverage with insulin and Lantus insulin  All the records are reviewed and case discussed with Care Management/Social Worker. Management plans discussed with the patient, family and they are in agreement.  CODE STATUS: Full code  DVT Prophylaxis: SCDs  TOTAL TIME TAKING CARE OF THIS PATIENT: 40 minutes.   POSSIBLE D/C IN 1 to 2 DAYS, DEPENDING ON CLINICAL CONDITION.  Saundra Shelling M.D on 02/26/2019 at 3:02 PM  Between 7am to 6pm - Pager - [phone]  After 6pm go to [email or web address] - password [email or web address] [facility]  Office  [phone]  CC: Primary care physician; Letta Median, MD  Note: This dictation was prepared with Dragon dictation along with smaller phrase technology. Any transcriptional errors that result from this process are unintentional.

## 2019-02-26 NOTE — Progress Notes (Signed)
Inpatient Diabetes Program Recommendations  AACE/ADA: New Consensus Statement on Inpatient Glycemic Control   Target Ranges:  Prepandial:   less than 140 mg/dL      Peak postprandial:   less than 180 mg/dL (1-2 hours)      Critically ill patients:  140 - 180 mg/dL  Results for ROLF, FELLS (MRN [id number]) as of 02/26/2019 09:26  Ref. Range 02/26/2019 03:53  Glucose Latest Ref Range: 70 - 99 mg/dL 339 (H)   Results for TAMERON, LAMA (MRN [id number]) as of 02/26/2019 09:26  Ref. Range 02/25/2019 22:46  Glucose-Capillary Latest Ref Range: 70 - 99 mg/dL 260 (H)    Review of Glycemic Control  Diabetes history: DM2 Outpatient Diabetes medications: Lantus 44 units QHS, Novolog 10 units TID with meals Current orders for Inpatient glycemic control: Lantus 44 units QHS, Novolog 10 units TID with meals; Solumedrol 60 mg Q6H, Decadron 8 mg daily  Inpatient Diabetes Program Recommendations:   Correction (SSI): Please consider ordering Novolog 0-15 units TID with meals and Novolog 0-5 units QHS.  Thanks, Barnie Alderman, RN, MSN, CDE Diabetes Coordinator Inpatient Diabetes Program [phone] (Team Pager from 8am to 5pm)

## 2019-02-27 LAB — CBC
HCT: 42.2 % (ref 39.0–52.0)
Hemoglobin: 14.1 g/dL (ref 13.0–17.0)
MCH: 30.1 pg (ref 26.0–34.0)
MCHC: 33.4 g/dL (ref 30.0–36.0)
MCV: 90.2 fL (ref 80.0–100.0)
Platelets: 193 10*3/uL (ref 150–400)
RBC: 4.68 MIL/uL (ref 4.22–5.81)
RDW: 12.6 % (ref 11.5–15.5)
WBC: 13.4 10*3/uL — ABNORMAL HIGH (ref 4.0–10.5)
nRBC: 0 % (ref 0.0–0.2)

## 2019-02-27 LAB — GLUCOSE, CAPILLARY
Glucose-Capillary: 247 mg/dL — ABNORMAL HIGH (ref 70–99)
Glucose-Capillary: 257 mg/dL — ABNORMAL HIGH (ref 70–99)
Glucose-Capillary: 273 mg/dL — ABNORMAL HIGH (ref 70–99)
Glucose-Capillary: 282 mg/dL — ABNORMAL HIGH (ref 70–99)
Glucose-Capillary: 211 mg/dL — ABNORMAL HIGH (ref 70–99)
Glucose-Capillary: 268 mg/dL — ABNORMAL HIGH (ref 70–99)

## 2019-02-27 LAB — CREATININE, SERUM
Creatinine, Ser: 0.82 mg/dL (ref 0.61–1.24)
GFR calc Af Amer: >60 mL/min (ref >60)
GFR calc non Af Amer: >60 mL/min (ref >60)

## 2019-02-27 LAB — LACTATE DEHYDROGENASE: LDH: 143 U/L (ref 98–192)

## 2019-02-27 LAB — FERRITIN: Ferritin: 405 ng/mL — ABNORMAL HIGH (ref 24–336)

## 2019-02-27 MED ORDER — SODIUM CHLORIDE 0.9 % IV SOLN
100.0000 mg | INTRAVENOUS | Status: DC
  Filled 2019-02-27: qty 20

## 2019-02-27 MED ORDER — APIXABAN 5 MG PO TABS: 5.0000 mg | ORAL_TABLET | Freq: Two times a day (BID) | ORAL | 0 refills | Status: DC

## 2019-02-27 MED ORDER — ASCORBIC ACID 500 MG PO TABS: 500.0000 mg | ORAL_TABLET | Freq: Two times a day (BID) | ORAL | 0 refills | Status: AC

## 2019-02-27 MED ORDER — APIXABAN 5 MG PO TABS: 10.0000 mg | ORAL_TABLET | Freq: Two times a day (BID) | ORAL | 0 refills | Status: DC

## 2019-02-27 MED ORDER — SODIUM CHLORIDE 0.9 % IV SOLN: 100.0000 mg | INTRAVENOUS | Status: DC

## 2019-02-27 MED ORDER — DEXAMETHASONE 4 MG PO TABS: 8.0000 mg | ORAL_TABLET | Freq: Every day | ORAL | 0 refills | Status: AC

## 2019-02-27 MED ORDER — ZINC SULFATE 220 (50 ZN) MG PO CAPS: 220.0000 mg | ORAL_CAPSULE | Freq: Every day | ORAL | 0 refills | Status: AC

## 2019-02-27 MED ORDER — AMOXICILLIN-POT CLAVULANATE 875-125 MG PO TABS: 1.0000 | ORAL_TABLET | Freq: Two times a day (BID) | ORAL | 0 refills | Status: AC

## 2019-02-27 NOTE — Discharge Summary (Addendum)
[facility] [place] NAME: Michael Gill    MR#:  [id number]  DATE OF BIRTH:  1961/01/18  DATE OF ADMISSION:  02/25/2019 ADMITTING PHYSICIAN: Nicholes Mango, MD  DATE OF DISCHARGE: 02/27/2019  PRIMARY CARE PHYSICIAN: Letta Median, MD   ADMISSION DIAGNOSIS:  Multiple subsegmental pulmonary emboli without acute cor pulmonale [I26.94] COVID-19 virus detected [U07.1]  DISCHARGE DIAGNOSIS:  Active Problems:   Pulmonary embolism (HCC) Multiple subsegmental pulmonary emboli COVID-19 infection Multifocal pneumonia Hypoxia Acute respiratory distress with hypoxia SECONDARY DIAGNOSIS:   Past Medical History:  Diagnosis Date  . Asthma   . B12 deficiency    in the past  . CAD (coronary artery disease)    s/p MI  . Colon cancer ([place]) 1990  . Complication of anesthesia    long time to wake up from colonoscopy.  . Conversion disorder    difficulty with visual disturbances and changes with unknown etiology, along with headaches  . CVA (cerebral infarction)   . 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 DVT (deep venous thrombosis) ([place]) 2016   behind right knee  . Dyspnea 09/2018   with exercise  . Dysrhythmia    has had Afib in past.  . GERD (gastroesophageal reflux disease)   . History of kidney stones 1990  . Hx of therapeutic radiation   . Hyperlipidemia   . Myocardial infarction ([place]) 1990  . Neuropathic pain, leg   . 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  . Seizure ([place])    last episode 08/2017. has no warning, will just happen. nothing specific precipitates event  . Sleep apnea    does not use cpap  . 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 Stroke [facility]) 2012, 03/2018   residual tremor left hand, facial droop  . Tremors of nervous system    specifically left hand, since stroke     ADMITTING  HISTORY Michael Gill  is a 58 y.o. male with a known history of insulin requiring diabetes mellitus, history of TIA, DVT in either 2016 completed his treatment with anticoagulation, atrial fibrillation not on any anticoagulation is presenting to the ED with a chief complaint of shortness of breath and chest pain and he is COVID positive.  CT angiogram of the chest with multifocal pneumonia and pulmonary embolism.  Patient is started on anticoagulation therapeutic dose and hospitalist team is called to admit the patient.  Decadron IV was given in the ED. patient was not transferred to [facility] as hospital was full.  Patient is agreeable to be admitted here and wife at bedside.  HOSPITAL COURSE:  Patient was admitted to [facility].  Patient started on broad-spectrum vancomycin and cefepime antibiotics.  CT angiogram of the chest showed multifocal pneumonia and pulmonary bothersome.  Patient was started on anticoagulation with IV heparin drip.  Patient was transitioned to oral Eliquis during hospitalization for anticoagulation.  Patient tolerated antibiotics well.  Was also given IVF remdesivir infusions and Actemra injections.  Patient tolerated remdesivir  injections and Actemra infusion well.  Shortness of breath improved.  Cough improved.  Patient has been weaned off oxygen.  Hypoxia resolved.  Elevated WBC secondary to steroids. Tolerated diet well  CONSULTS OBTAINED:  Treatment Team:  Ottie Glazier, MD  DRUG ALLERGIES:   Allergies  Allergen Reactions  .  Shellfish Allergy Hives    Swelling of throat and mouth. Betadine is NOT a problem  . Codeine Other (See Comments)    Reaction:  Hallucinations   . Sulfa Antibiotics Hives    DISCHARGE MEDICATIONS:   Allergies as of 02/27/2019      Reactions   Shellfish Allergy Hives   Swelling of throat and mouth. Betadine is NOT a problem   Codeine Other (See Comments)   Reaction:  Hallucinations    Sulfa Antibiotics Hives       Medication List    STOP taking these medications   ibuprofen 600 MG tablet Commonly known as: ADVIL     TAKE these medications   acetaminophen 500 MG tablet Commonly known as: TYLENOL Take 500 mg by mouth every 6 (six) hours as needed for moderate pain.   albuterol 108 (90 Base) MCG/ACT inhaler Commonly known as: VENTOLIN HFA Inhale 1-2 puffs into the lungs every 4 (four) hours as needed for wheezing or shortness of breath.   amoxicillin-clavulanate 875-125 MG tablet Commonly known as: Augmentin Take 1 tablet by mouth every 12 (twelve) hours for 5 days.   apixaban 5 MG Tabs tablet Commonly known as: ELIQUIS Take 2 tablets (10 mg total) by mouth 2 (two) times daily for 6 days.   apixaban 5 MG Tabs tablet Commonly known as: ELIQUIS Take 1 tablet (5 mg total) by mouth 2 (two) times daily. Start taking on: March 02, 2019   ascorbic acid 500 MG tablet Commonly known as: VITAMIN C Take 1 tablet (500 mg total) by mouth 2 (two) times daily.   aspirin EC 81 MG tablet Take 81 mg by mouth every morning.   atorvastatin 10 MG tablet Commonly known as: LIPITOR Take 10 mg by mouth daily.   bismuth subsalicylate 254 MG chewable tablet Commonly known as: PEPTO BISMOL Chew 524 mg by mouth as needed for indigestion or diarrhea or loose stools.   cetirizine 10 MG tablet Commonly known as: ZYRTEC Take 10 mg by mouth daily.   dexamethasone 4 MG tablet Commonly known as: DECADRON Take 2 tablets (8 mg total) by mouth daily for 5 days. Start taking on: February 28, 2019   fluticasone 50 MCG/ACT nasal spray Commonly known as: FLONASE Place 1 spray into both nostrils daily as needed for allergies or rhinitis.   gabapentin 100 MG capsule Commonly known as: NEURONTIN Take 100 mg by mouth 3 (three) times daily.   insulin aspart 100 UNIT/ML injection Commonly known as: novoLOG Inject 10 Units into the skin 3 (three) times daily with meals.   insulin glargine 100 UNIT/ML  injection Commonly known as: LANTUS Inject 44 Units into the skin at bedtime.   meclizine 25 MG tablet Commonly known as: ANTIVERT Take 25 mg by mouth 3 (three) times daily as needed for dizziness.   omeprazole 40 MG capsule Commonly known as: PRILOSEC Take 40 mg by mouth daily as needed (GERD symptoms).   zinc sulfate 220 (50 Zn) MG capsule Take 1 capsule (220 mg total) by mouth daily. Start taking on: February 28, 2019       Today  Patient seen today via tele consult No cough No fever Shortness of breath resolved weaned off oxygen Tolerating diet well Hemodynamically stable Advised quarantine for 14 days VITAL SIGNS:  Blood pressure (!) 142/83, pulse 75, temperature 99 F (37.2 C), temperature source Axillary, resp. rate (!) 22, height 5\' 8"  (1.727 m), weight 117.9 kg, SpO2 98 %.  I/O:    Intake/Output  Summary (Last 24 hours) at 02/27/2019 1314 Last data filed at 02/27/2019 0856 Gross per 24 hour  Intake 400 ml  Output 2200 ml  Net -1800 ml    PHYSICAL EXAMINATION:  Physical Exam  GENERAL:  58 y.o.-year-old patient lying in the bed with no acute distress.  LUNGS: Normal breath sounds bilaterally, no wheezing, rales,rhonchi or crepitation. No use of accessory muscles of respiration.  CARDIOVASCULAR: S1, S2 normal. No murmurs, rubs, or gallops.  ABDOMEN: Soft, non-tender, non-distended. Bowel sounds present. No organomegaly or mass.  NEUROLOGIC: Moves all 4 extremities. PSYCHIATRIC: The patient is alert and oriented x 3.  SKIN: No obvious rash, lesion, or ulcer.   DATA REVIEW:   CBC Recent Labs  Lab 02/27/19 0658  WBC 13.4*  HGB 14.1  HCT 42.2  PLT 193    Chemistries  Recent Labs  Lab 02/25/19 1735 02/26/19 0353 02/27/19 0658  NA  --  135  --   K  --  4.5  --   CL  --  106  --   CO2  --  21*  --   GLUCOSE  --  339*  --   BUN  --  17  --   CREATININE  --  0.92 0.82  CALCIUM  --  8.0*  --   AST 16  --   --   ALT 19  --   --   ALKPHOS 67  --    --   BILITOT 0.9  --   --     Cardiac Enzymes No results for input(s): TROPONINI in the last 168 hours.  Microbiology Results  Results for orders placed or performed during the hospital encounter of 02/25/19  SARS Coronavirus 2 (CEPHEID- Performed in [facility] hospital lab), Hosp Order     Status: Abnormal   Collection Time: 02/25/19  3:27 PM   Specimen: Nasopharyngeal Swab  Result Value Ref Range Status   SARS Coronavirus 2 POSITIVE (A) NEGATIVE Final    Comment: RESULT CALLED TO, READ BACK BY AND VERIFIED WITH: AMBER PAYNE AT 1651 ON 02/25/2019 [place]. (NOTE) If result is NEGATIVE SARS-CoV-2 target nucleic acids are NOT DETECTED. The SARS-CoV-2 RNA is generally detectable in upper and lower  respiratory specimens during the acute phase of infection. The lowest  concentration of SARS-CoV-2 viral copies this assay can detect is 250  copies / mL. A negative result does not preclude SARS-CoV-2 infection  and should not be used as the sole basis for treatment or other  patient management decisions.  A negative result may occur with  improper specimen collection / handling, submission of specimen other  than nasopharyngeal swab, presence of viral mutation(s) within the  areas targeted by this assay, and inadequate number of viral copies  (<250 copies / mL). A negative result must be combined with clinical  observations, patient history, and epidemiological information. If result is POSITIVE SARS-CoV-2 target nucleic acids are DETECTED.  The SARS-CoV-2 RNA is generally detectable in upper and lower  respiratory specimens during the acute phase of infection.  Positive  results are indicative of active infection with SARS-CoV-2.  Clinical  correlation with patient history and other diagnostic information is  necessary to determine patient infection status.  Positive results do  not rule out bacterial infection or co-infection with other viruses. If result is PRESUMPTIVE  POSTIVE SARS-CoV-2 nucleic acids MAY BE PRESENT.   A presumptive positive result was obtained on the submitted specimen  and confirmed on repeat testing.  While  2019 novel coronavirus  (SARS-CoV-2) nucleic acids may be present in the submitted sample  additional confirmatory testing may be necessary for epidemiological  and / or clinical management purposes  to differentiate between  SARS-CoV-2 and other Sarbecovirus currently known to infect humans.  If clinically indicated additional testing with an alternate test  methodology [phone])  is advised. The SARS-CoV-2 RNA is generally  detectable in upper and lower respiratory specimens during the acute  phase of infection. The expected result is Negative. Fact Sheet for Patients:  StrictlyIdeas.no Fact Sheet for Healthcare Providers: BankingDealers.co.za This test is not yet approved or cleared by the [place] FDA and has been authorized for detection and/or diagnosis of SARS-CoV-2 by FDA under an Emergency Use Authorization (EUA).  This EUA will remain in effect (meaning this test can be used) for the duration of the COVID-19 declaration under Section 564(b)(1) of the Act, 21 U.S.C. section 360bbb-3(b)(1), unless the authorization is terminated or revoked sooner. Performed at [facility], [address] [place], [place] [address]   MRSA PCR Screening     Status: None   Collection Time: 02/26/19  3:38 AM   Specimen: Nasal Mucosa; Nasopharyngeal  Result Value Ref Range Status   MRSA by PCR NEGATIVE NEGATIVE Final    Comment:        The GeneXpert MRSA Assay (FDA approved for NASAL specimens only), is one component of a comprehensive MRSA colonization surveillance program. It is not intended to diagnose MRSA infection nor to guide or monitor treatment for MRSA infections. Performed at [facility], [place], [place] [address]      RADIOLOGY:  Ct Angio Chest Pe W And/or Wo Contrast  Result Date: 02/25/2019 CLINICAL DATA:  Chest pain and shortness of breath EXAM: CT ANGIOGRAPHY CHEST WITH CONTRAST TECHNIQUE: Multidetector CT imaging of the chest was performed using the standard protocol during bolus administration of intravenous contrast. Multiplanar CT image reconstructions and MIPs were obtained to evaluate the vascular anatomy. CONTRAST:  60mL OMNIPAQUE IOHEXOL 350 MG/ML SOLN COMPARISON:  April 20, 2016 FINDINGS: Cardiovascular: There are pulmonary emboli arising from the distal most aspect of the right main pulmonary artery extending into the proximal right upper and lower lobe pulmonary artery branches. Smaller pulmonary emboli are also seen in several right lower lobe pulmonary artery branches. There is no appreciable pulmonary embolus on the left. The right ventricle to left ventricle diameter ratio is less than 0.9, not consistent with right heart strain. There is no thoracic aortic aneurysm or dissection. Visualized great vessels appear unremarkable. There is no appreciable pericardial effusion or pericardial thickening. Mediastinum/Nodes: Visualized thyroid appears normal. There are multiple subcentimeter mediastinal lymph nodes. There are no lymph nodes which meet size criteria for pathologic significance in the thoracic region. Several lymph nodes are slightly larger than was present on the 2017 study, however. No esophageal lesion evident. Lungs/Pleura: There is ground-glass type opacity in both lower lobes involving multiple segments. There is also patchy ground-glass type opacity in each upper lobe and lingular regions. There is no frank airspace consolidation. No pulmonary infarct is demonstrable. Upper Abdomen: Spleen is incompletely visualized although spleen appears enlarged. Spleen measures 14.0 cm from right to left dimension. Visualized upper abdominal structures otherwise appear unremarkable. Musculoskeletal:  No blastic or lytic bone lesions. No chest wall lesions evident. Review of the MIP images confirms the above findings. IMPRESSION: 1. Pulmonary emboli arising from the distal most aspect of the right main pulmonary artery extending into proximal  right upper and lower lobe branches. Smaller pulmonary emboli noted in several lower lobe pulmonary artery branches. No right heart strain evident. 2. Multifocal ground-glass type opacity consistent with multifocal pneumonia. No consolidation. Suspect atypical organism pneumonia as most likely. 3. Multiple small lymph nodes which are slightly larger than on prior study from 2017 but do not meet size criteria for pathologic significance. Question reactive etiology to the multifocal pneumonia. 4. Incompletely visualized spleen. Spleen may well be somewhat enlarged. Critical Value/emergent results were called by telephone at the time of interpretation on 02/25/2019 at 2:52 pm to Dr. Marjean Donna , who verbally acknowledged these results. Electronically Signed   By: Lowella Grip III M.D.   On: 02/25/2019 14:54    Follow up with PCP in 1 week.  Management plans discussed with the patient, family and they are in agreement.  CODE STATUS: Full code    Code Status Orders  (From admission, onward)         Start     Ordered   02/25/19 2117  Full code  Continuous     02/25/19 2116        Code Status History    Date Active Date Inactive Code Status Order ID Comments User Context   01/30/2019 0414 02/01/2019 1408 Full Code [id number]  Sidney Ace Arvella Merles, MD ED   06/05/2016 2026 06/06/2016 1741 Full Code [id number]  Dustin Flock, MD Inpatient   Advance Care Planning Activity      TOTAL TIME TAKING CARE OF THIS PATIENT ON DAY OF DISCHARGE: more than 35 minutes.   Saundra Shelling M.D on 02/27/2019 at 1:14 PM  Between 7am to 6pm - Pager - [phone]  After 6pm go to [email or web address] - password [email or web address] [place] [facility]  Office   [phone]  CC: Primary care physician; Letta Median, MD  Note: This dictation was prepared with Dragon dictation along with smaller phrase technology. Any transcriptional errors that result from this process are unintentional.

## 2019-02-27 NOTE — Discharge Instructions (Signed)
COVID-19 COVID-19 is a respiratory infection that is caused by a virus called severe acute respiratory syndrome coronavirus 2 (SARS-CoV-2). The disease is also known as coronavirus disease or novel coronavirus. In some people, the virus may not cause any symptoms. In others, it may cause a serious infection. The infection can get worse quickly and can lead to complications, such as:  Pneumonia, or infection of the lungs.  Acute respiratory distress syndrome or ARDS. This is fluid build-up in the lungs.  Acute respiratory failure. This is a condition in which there is not enough oxygen passing from the lungs to the body.  Sepsis or septic shock. This is a serious bodily reaction to an infection.  Blood clotting problems.  Secondary infections due to bacteria or fungus. The virus that causes COVID-19 is contagious. This means that it can spread from person to person through droplets from coughs and sneezes (respiratory secretions). What are the causes? This illness is caused by a virus. You may catch the virus by:  Breathing in droplets from an infected person's cough or sneeze.  Touching something, like a table or a doorknob, that was exposed to the virus (contaminated) and then touching your mouth, nose, or eyes. What increases the risk? Risk for infection You are more likely to be infected with this virus if you:  Live in or travel to an area with a COVID-19 outbreak.  Come in contact with a sick person who recently traveled to an area with a COVID-19 outbreak.  Provide care for or live with a person who is infected with COVID-19. Risk for serious illness You are more likely to become seriously ill from the virus if you:  Are 59 years of age or older.  Have a long-term disease that lowers your body's ability to fight infection (immunocompromised).  Live in a nursing home or long-term care facility.  Have a long-term (chronic) disease such as: ? Chronic lung disease, including  chronic obstructive pulmonary disease or asthma ? Heart disease. ? Diabetes. ? Chronic kidney disease. ? Liver disease.  Are obese. What are the signs or symptoms? Symptoms of this condition can range from mild to severe. Symptoms may appear any time from 2 to 14 days after being exposed to the virus. They include:  A fever.  A cough.  Difficulty breathing.  Chills.  Muscle pains.  A sore throat.  Loss of taste or smell. Some people may also have stomach problems, such as nausea, vomiting, or diarrhea. Other people may not have any symptoms of COVID-19. How is this diagnosed? This condition may be diagnosed based on:  Your signs and symptoms, especially if: ? You live in an area with a COVID-19 outbreak. ? You recently traveled to or from an area where the virus is common. ? You provide care for or live with a person who was diagnosed with COVID-19.  A physical exam.  Lab tests, which may include: ? A nasal swab to take a sample of fluid from your nose. ? A throat swab to take a sample of fluid from your throat. ? A sample of mucus from your lungs (sputum). ? Blood tests.  Imaging tests, which may include, X-rays, CT scan, or ultrasound. How is this treated? At present, there is no medicine to treat COVID-19. Medicines that treat other diseases are being used on a trial basis to see if they are effective against COVID-19. Your health care provider will talk with you about ways to treat your symptoms. For most  people, the infection is mild and can be managed at home with rest, fluids, and over-the-counter medicines. °Treatment for a serious infection usually takes places in a hospital intensive care unit (ICU). It may include one or more of the following treatments. These treatments are given until your symptoms improve. °· Receiving fluids and medicines through an IV. °· Supplemental oxygen. Extra oxygen is given through a tube in the nose, a face mask, or a  hood. °· Positioning you to lie on your stomach (prone position). This makes it easier for oxygen to get into the lungs. °· Continuous positive airway pressure (CPAP) or bi-level positive airway pressure (BPAP) machine. This treatment uses mild air pressure to keep the airways open. A tube that is connected to a motor delivers oxygen to the body. °· Ventilator. This treatment moves air into and out of the lungs by using a tube that is placed in your windpipe. °· Tracheostomy. This is a procedure to create a hole in the neck so that a breathing tube can be inserted. °· Extracorporeal membrane oxygenation (ECMO). This procedure gives the lungs a chance to recover by taking over the functions of the heart and lungs. It supplies oxygen to the body and removes carbon dioxide. °Follow these instructions at home: °Lifestyle °· If you are sick, stay home except to get medical care. Your health care provider will tell you how long to stay home. Call your health care provider before you go for medical care. °· Rest at home as told by your health care provider. °· Do not use any products that contain nicotine or tobacco, such as cigarettes, e-cigarettes, and chewing tobacco. If you need help quitting, ask your health care provider. °· Return to your normal activities as told by your health care provider. Ask your health care provider what activities are safe for you. °General instructions °· Take over-the-counter and prescription medicines only as told by your health care provider. °· Drink enough fluid to keep your urine pale yellow. °· Keep all follow-up visits as told by your health care provider. This is important. °How is this prevented? ° °There is no vaccine to help prevent COVID-19 infection. However, there are steps you can take to protect yourself and others from this virus. °To protect yourself:  °· Do not travel to areas where COVID-19 is a risk. The areas where COVID-19 is reported change often. To identify  high-risk areas and travel restrictions, check the CDC travel website: wwwnc.cdc.gov/travel/notices °· If you live in, or must travel to, an area where COVID-19 is a risk, take precautions to avoid infection. °? Stay away from people who are sick. °? Wash your hands often with soap and water for 20 seconds. If soap and water are not available, use an alcohol-based hand sanitizer. °? Avoid touching your mouth, face, eyes, or nose. °? Avoid going out in public, follow guidance from your state and local health authorities. °? If you must go out in public, wear a cloth face covering or face mask. °? Disinfect objects and surfaces that are frequently touched every day. This may include: °§ Counters and tables. °§ Doorknobs and light switches. °§ Sinks and faucets. °§ Electronics, such as phones, remote controls, keyboards, computers, and tablets. °To protect others: °If you have symptoms of COVID-19, take steps to prevent the virus from spreading to others. °· If you think you have a COVID-19 infection, contact your health care provider right away. Tell your health care team that you think you may   have a COVID-19 infection. °· Stay home. Leave your house only to seek medical care. Do not use public transport. °· Do not travel while you are sick. °· Wash your hands often with soap and water for 20 seconds. If soap and water are not available, use alcohol-based hand sanitizer. °· Stay away from other members of your household. Let healthy household members care for children and pets, if possible. If you have to care for children or pets, wash your hands often and wear a mask. If possible, stay in your own room, separate from others. Use a different bathroom. °· Make sure that all people in your household wash their hands well and often. °· Cough or sneeze into a tissue or your sleeve or elbow. Do not cough or sneeze into your hand or into the air. °· Wear a cloth face covering or face mask. °Where to find more  information °· Centers for Disease Control and Prevention: www.cdc.gov/coronavirus/2019-ncov/index.html °· World Health Organization: www.who.int/health-topics/coronavirus °Contact a health care provider if: °· You live in or have traveled to an area where COVID-19 is a risk and you have symptoms of the infection. °· You have had contact with someone who has COVID-19 and you have symptoms of the infection. °Get help right away if: °· You have trouble breathing. °· You have pain or pressure in your chest. °· You have confusion. °· You have bluish lips and fingernails. °· You have difficulty waking from sleep. °· You have symptoms that get worse. °These symptoms may represent a serious problem that is an emergency. Do not wait to see if the symptoms will go away. Get medical help right away. Call your local emergency services (911 in the [place] Do not drive yourself to the hospital. Let the emergency medical personnel know if you think you have COVID-19. °Summary °· COVID-19 is a respiratory infection that is caused by a virus. It is also known as coronavirus disease or novel coronavirus. It can cause serious infections, such as pneumonia, acute respiratory distress syndrome, acute respiratory failure, or sepsis. °· The virus that causes COVID-19 is contagious. This means that it can spread from person to person through droplets from coughs and sneezes. °· You are more likely to develop a serious illness if you are 65 years of age or older, have a weak immunity, live in a nursing home, or have chronic disease. °· There is no medicine to treat COVID-19. Your health care provider will talk with you about ways to treat your symptoms. °· Take steps to protect yourself and others from infection. Wash your hands often and disinfect objects and surfaces that are frequently touched every day. Stay away from people who are sick and wear a mask if you are sick. °This information is not intended to replace advice given to you by  your health care provider. Make sure you discuss any questions you have with your health care provider. °Document Released: 08/22/2018 Document Revised: 12/12/2018 Document Reviewed: 08/22/2018 °Elsevier Patient Education © 2020 [employer] ° °COVID-19: How to Protect Yourself and Others °Know how it spreads °· There is currently no vaccine to prevent coronavirus disease 2019 (COVID-19). °· The best way to prevent illness is to avoid being exposed to this virus. °· The virus is thought to spread mainly from person-to-person. °? Between people who are in close contact with one another (within about 6 feet). °? Through respiratory droplets produced when an infected person coughs, sneezes or talks. °? These droplets can land in   the mouths or noses of people who are nearby or possibly be inhaled into the lungs. °? Some recent studies have suggested that COVID-19 may be spread by people who are not showing symptoms. °Everyone should °Clean your hands often °· Wash your hands often with soap and water for at least 20 seconds especially after you have been in a public place, or after blowing your nose, coughing, or sneezing. °· If soap and water are not readily available, use a hand sanitizer that contains at least 60% alcohol. Cover all surfaces of your hands and rub them together until they feel dry. °· Avoid touching your eyes, nose, and mouth with unwashed hands. °Avoid close contact °· Stay home if you are sick. °· Avoid close contact with people who are sick. °· Put distance between yourself and other people. °? Remember that some people without symptoms may be able to spread virus. °? This is especially important for people who are at higher risk of getting very sick.www.cdc.gov/coronavirus/2019-ncov/need-extra-precautions/people-at-higher-risk.html °Cover your mouth and nose with a cloth face cover when around others °· You could spread COVID-19 to others even if you do not feel sick. °· Everyone should wear a  cloth face cover when they have to go out in public, for example to the grocery store or to pick up other necessities. °? Cloth face coverings should not be placed on young children under age 2, anyone who has trouble breathing, or is unconscious, incapacitated or otherwise unable to remove the mask without assistance. °· The cloth face cover is meant to protect other people in case you are infected. °· Do NOT use a facemask meant for a healthcare worker. °· Continue to keep about 6 feet between yourself and others. The cloth face cover is not a substitute for social distancing. °Cover coughs and sneezes °· If you are in a private setting and do not have on your cloth face covering, remember to always cover your mouth and nose with a tissue when you cough or sneeze or use the inside of your elbow. °· Throw used tissues in the trash. °· Immediately wash your hands with soap and water for at least 20 seconds. If soap and water are not readily available, clean your hands with a hand sanitizer that contains at least 60% alcohol. °Clean and disinfect °· Clean AND disinfect frequently touched surfaces daily. This includes tables, doorknobs, light switches, countertops, handles, desks, phones, keyboards, toilets, faucets, and sinks. www.cdc.gov/coronavirus/2019-ncov/prevent-getting-sick/disinfecting-your-home.html °· If surfaces are dirty, clean them: Use detergent or soap and water prior to disinfection. °· Then, use a household disinfectant. You can see a list of EPA-registered household disinfectants here. °cdc.gov/coronavirus °12/03/2018 °This information is not intended to replace advice given to you by your health care provider. Make sure you discuss any questions you have with your health care provider. °Document Released: 11/12/2018 Document Revised: 12/11/2018 Document Reviewed: 11/12/2018 °Elsevier Patient Education © 2020 [employer] ° ° °COVID-19 Frequently Asked Questions °COVID-19 (coronavirus disease) is  an infection that is caused by a large family of viruses. Some viruses cause illness in people and others cause illness in animals like camels, cats, and bats. In some cases, the viruses that cause illness in animals can spread to humans. °Where did the coronavirus come from? °In December 2019, [place] told the World Health Organization (WHO) of several cases of lung disease (human respiratory illness). These cases were linked to an open seafood and livestock market in the city of [place]. The link to the seafood and   livestock market suggests that the virus may have spread from animals to humans. However, since that first outbreak in December, the virus has also been shown to spread from person to person. °What is the name of the disease and the virus? °Disease name °Early on, this disease was called novel coronavirus. This is because scientists determined that the disease was caused by a new (novel) respiratory virus. The World Health Organization (WHO) has now named the disease COVID-19, or coronavirus disease. °Virus name °The virus that causes the disease is called severe acute respiratory syndrome coronavirus 2 (SARS-CoV-2). °More information on disease and virus naming °World Health Organization (WHO): www.who.int/emergencies/diseases/novel-coronavirus-2019/technical-guidance/naming-the-coronavirus-disease-(covid-2019)-and-the-virus-that-causes-it °Who is at risk for complications from coronavirus disease? °Some people may be at higher risk for complications from coronavirus disease. This includes older adults and people who have chronic diseases, such as heart disease, diabetes, and lung disease. °If you are at higher risk for complications, take these extra precautions: °· Avoid close contact with people who are sick or have a fever or cough. Stay at least 3-6 ft (1-2 m) away from them, if possible. °· Wash your hands often with soap and water for at least 20 seconds. °· Avoid touching your face, mouth, nose, or  eyes. °· Keep supplies on hand at home, such as food, medicine, and cleaning supplies. °· Stay home as much as possible. °· Avoid social gatherings and travel. °How does coronavirus disease spread? °The virus that causes coronavirus disease spreads easily from person to person (is contagious). There are also cases of community-spread disease. This means the disease has spread to: °· People who have no known contact with other infected people. °· People who have not traveled to areas where there are known cases. °It appears to spread from one person to another through droplets from coughing or sneezing. °Can I get the virus from touching surfaces or objects? °There is still a lot that we do not know about the virus that causes coronavirus disease. Scientists are basing a lot of information on what they know about similar viruses, such as: °· Viruses cannot generally survive on surfaces for long. They need a human body (host) to survive. °· It is more likely that the virus is spread by close contact with people who are sick (direct contact), such as through: °? Shaking hands or hugging. °? Breathing in respiratory droplets that travel through the air. This can happen when an infected person coughs or sneezes on or near other people. °· It is less likely that the virus is spread when a person touches a surface or object that has the virus on it (indirect contact). The virus may be able to enter the body if the person touches a surface or object and then touches his or her face, eyes, nose, or mouth. °Can a person spread the virus without having symptoms of the disease? °It may be possible for the virus to spread before a person has symptoms of the disease, but this is most likely not the main way the virus is spreading. It is more likely for the virus to spread by being in close contact with people who are sick and breathing in the respiratory droplets of a sick person's cough or sneeze. °What are the symptoms of  coronavirus disease? °Symptoms vary from person to person and can range from mild to severe. Symptoms may include: °· Fever. °· Cough. °· Tiredness, weakness, or fatigue. °· Fast breathing or feeling short of breath. °These symptoms can appear anywhere from   2 to 14 days after you have been exposed to the virus. If you develop symptoms, call your health care provider. People with severe symptoms may need hospital care. °If I am exposed to the virus, how long does it take before symptoms start? °Symptoms of coronavirus disease may appear anywhere from 2 to 14 days after a person has been exposed to the virus. If you develop symptoms, call your health care provider. °Should I be tested for this virus? °Your health care provider will decide whether to test you based on your symptoms, history of exposure, and your risk factors. °How does a health care provider test for this virus? °Health care providers will collect samples to send for testing. Samples may include: °· Taking a swab of fluid from the nose. °· Taking fluid from the lungs by having you cough up mucus (sputum) into a sterile cup. °· Taking a blood sample. °· Taking a stool or urine sample. °Is there a treatment or vaccine for this virus? °Currently, there is no vaccine to prevent coronavirus disease. Also, there are no medicines like antibiotics or antivirals to treat the virus. A person who becomes sick is given supportive care, which means rest and fluids. A person may also relieve his or her symptoms by using over-the-counter medicines that treat sneezing, coughing, and runny nose. These are the same medicines that a person takes for the common cold. °If you develop symptoms, call your health care provider. People with severe symptoms may need hospital care. °What can I do to protect myself and my family from this virus? ° °  ° °You can protect yourself and your family by taking the same actions that you would take to prevent the spread of other viruses.  Take the following actions: °· Wash your hands often with soap and water for at least 20 seconds. If soap and water are not available, use alcohol-based hand sanitizer. °· Avoid touching your face, mouth, nose, or eyes. °· Cough or sneeze into a tissue, sleeve, or elbow. Do not cough or sneeze into your hand or the air. °? If you cough or sneeze into a tissue, throw it away immediately and wash your hands. °· Disinfect objects and surfaces that you frequently touch every day. °· Avoid close contact with people who are sick or have a fever or cough. Stay at least 3-6 ft (1-2 m) away from them, if possible. °· Stay home if you are sick, except to get medical care. Call your health care provider before you get medical care. °· Make sure your vaccines are up to date. Ask your health care provider what vaccines you need. °What should I do if I need to travel? °Follow travel recommendations from your local health authority, the CDC, and WHO. °Travel information and advice °· Centers for Disease Control and Prevention (CDC): www.cdc.gov/coronavirus/2019-ncov/travelers/index.html °· World Health Organization (WHO): www.who.int/emergencies/diseases/novel-coronavirus-2019/travel-advice °Know the risks and take action to protect your health °· You are at higher risk of getting coronavirus disease if you are traveling to areas with an outbreak or if you are exposed to travelers from areas with an outbreak. °· Wash your hands often and practice good hygiene to lower the risk of catching or spreading the virus. °What should I do if I am sick? °General instructions to stop the spread of infection °· Wash your hands often with soap and water for at least 20 seconds. If soap and water are not available, use alcohol-based hand sanitizer. °· Cough or sneeze into a   tissue, sleeve, or elbow. Do not cough or sneeze into your hand or the air.  If you cough or sneeze into a tissue, throw it away immediately and wash your hands.  Stay  home unless you must get medical care. Call your health care provider or local health authority before you get medical care.  Avoid public areas. Do not take public transportation, if possible.  If you can, wear a mask if you must go out of the house or if you are in close contact with someone who is not sick. Keep your home clean  Disinfect objects and surfaces that are frequently touched every day. This may include: ? Counters and tables. ? Doorknobs and light switches. ? Sinks and faucets. ? Electronics such as phones, remote controls, keyboards, computers, and tablets.  Wash dishes in hot, soapy water or use a dishwasher. Air-dry your dishes.  Wash laundry in hot water. Prevent infecting other household members  Let healthy household members care for children and pets, if possible. If you have to care for children or pets, wash your hands often and wear a mask.  Sleep in a different bedroom or bed, if possible.  Do not share personal items, such as razors, toothbrushes, deodorant, combs, brushes, towels, and washcloths. Where to find more information Centers for Disease Control and Prevention (CDC)  Information and news updates: https://www.butler-gonzalez.com/ World Health Organization [facility])  Information and news updates: MissExecutive.com.ee  Coronavirus health topic: https://www.castaneda.info/  Questions and answers on COVID-19: OpportunityDebt.at  Global tracker: who.sprinklr.com American Academy of Pediatrics (AAP)  Information for families: www.healthychildren.org/English/health-issues/conditions/chest-lungs/Pages/2019-Novel-Coronavirus.aspx The coronavirus situation is changing rapidly. Check your local health authority website or the CDC and [facility] websites for updates and news. When should I contact a health care provider?  Contact your health care provider if you have symptoms of an  infection, such as fever or cough, and you: ? Have been near anyone who is known to have coronavirus disease. ? Have come into contact with a person who is suspected to have coronavirus disease. ? Have traveled outside of the country. When should I get emergency medical care?  Get help right away by calling your local emergency services (911 in the [place] if you have: ? Trouble breathing. ? Pain or pressure in your chest. ? Confusion. ? Blue-tinged lips and fingernails. ? Difficulty waking from sleep. ? Symptoms that get worse. Let the emergency medical personnel know if you think you have coronavirus disease. Summary  A new respiratory virus is spreading from person to person and causing COVID-19 (coronavirus disease).  The virus that causes COVID-19 appears to spread easily. It spreads from one person to another through droplets from coughing or sneezing.  Older adults and those with chronic diseases are at higher risk of disease. If you are at higher risk for complications, take extra precautions.  There is currently no vaccine to prevent coronavirus disease. There are no medicines, such as antibiotics or antivirals, to treat the virus.  You can protect yourself and your family by washing your hands often, avoiding touching your face, and covering your coughs and sneezes. This information is not intended to replace advice given to you by your health care provider. Make sure you discuss any questions you have with your health care provider. Document Released: 11/12/2018 Document Revised: 11/12/2018 Document Reviewed: 11/12/2018 Elsevier Patient Education  [place] if You Are Sick If you are sick with COVID-19 or think you might have COVID-19, follow  the steps below to help protect other people in your home and community. Stay home except to get medical care.  Stay home. Most people with COVID-19 have mild illness and are able to recover at  home without medical care. Do not leave your home, except to get medical care. Do not visit public areas.  Take care of yourself. Get rest and stay hydrated.  Get medical care when needed. Call your doctor before you go to their office for care. But, if you have trouble breathing or other concerning symptoms, call 911 for immediate help.  Avoid public transportation, ride-sharing, or taxis. Separate yourself from other people and pets in your home.  As much as possible, stay in a specific room and away from other people and pets in your home. Also, you should use a separate bathroom, if available. If you need to be around other people or animals in or outside of the home, wear a cloth face covering. ? See COVID-19 and Animals if you have questions about pets: [email or web address] Monitor your symptoms.  Common symptoms of COVID-19 include fever and cough. Trouble breathing is a more serious symptom that means you should get medical attention.  Follow care instructions from your healthcare provider and local health department. Your local health authorities will give instructions on checking your symptoms and reporting information. If you develop emergency warning signs for COVID-19 get medical attention immediately.  Emergency warning signs include*:  Trouble breathing  Persistent pain or pressure in the chest  New confusion or not able to be woken  Bluish lips or face *This list is not all inclusive. Please consult your medical provider for any other symptoms that are severe or concerning to you. Call 911 if you have a medical emergency. If you have a medical emergency and need to call 911, notify the operator that you have or think you might have, COVID-19. If possible, put on a facemask before medical help arrives. Call ahead before visiting your doctor.  Call ahead. Many medical visits for routine care are being postponed or done by phone  or telemedicine.  If you have a medical appointment that cannot be postponed, call your doctor's office. This will help the office protect themselves and other patients. If you are sick, wear a cloth covering over your nose and mouth.  You should wear a cloth face covering over your nose and mouth if you must be around other people or animals, including pets (even at home).  You don't need to wear the cloth face covering if you are alone. If you can't put on a cloth face covering (because of trouble breathing for example), cover your coughs and sneezes in some other way. Try to stay at least 6 feet away from other people. This will help protect the people around you. Note: During the COVID-19 pandemic, medical grade facemasks are reserved for healthcare workers and some first responders. You may need to make a cloth face covering using a scarf or bandana. Cover your coughs and sneezes.  Cover your mouth and nose with a tissue when you cough or sneeze.  Throw used tissues in a lined trash can.  Immediately wash your hands with soap and water for at least 20 seconds. If soap and water are not available, clean your hands with an alcohol-based hand sanitizer that contains at least 60% alcohol. Clean your hands often.  Wash your hands often with soap and water for at least 20 seconds. This is especially important after blowing your  nose, coughing, or sneezing; going to the bathroom; and before eating or preparing food.  Use hand sanitizer if soap and water are not available. Use an alcohol-based hand sanitizer with at least 60% alcohol, covering all surfaces of your hands and rubbing them together until they feel dry.  Soap and water are the best option, especially if your hands are visibly dirty.  Avoid touching your eyes, nose, and mouth with unwashed hands. Avoid sharing personal household items.  Do not share dishes, drinking glasses, cups, eating utensils, towels, or bedding with other  people in your home.  Wash these items thoroughly after using them with soap and water or put them in the dishwasher. Clean all "high-touch" surfaces everyday.  Clean and disinfect high-touch surfaces in your "sick room" and bathroom. Let someone else clean and disinfect surfaces in common areas, but not your bedroom and bathroom.  If a caregiver or other person needs to clean and disinfect a sick person's bedroom or bathroom, they should do so on an as-needed basis. The caregiver/other person should wear a mask and wait as long as possible after the sick person has used the bathroom. High-touch surfaces include phones, remote controls, counters, tabletops, doorknobs, bathroom fixtures, toilets, keyboards, tablets, and bedside tables.  Clean and disinfect areas that may have blood, stool, or body fluids on them.  Use household cleaners and disinfectants. Clean the area or item with soap and water or another detergent if it is dirty. Then use a household disinfectant. ? Be sure to follow the instructions on the label to ensure safe and effective use of the product. Many products recommend keeping the surface wet for several minutes to ensure germs are killed. Many also recommend precautions such as wearing gloves and making sure you have good ventilation during use of the product. ? Most EPA-registered household disinfectants should be effective. How to discontinue home isolation  People with COVID-19 who have stayed home (home isolated) can stop home isolation under the following conditions: ? If you will not have a test to determine if you are still contagious, you can leave home after these three things have happened:  You have had no fever for at least 72 hours (that is three full days of no fever without the use of medicine that reduces fevers) AND  other symptoms have improved (for example, when your cough or shortness of breath has improved) AND  at least 10 days have passed since your  symptoms first appeared. ? If you will be tested to determine if you are still contagious, you can leave home after these three things have happened:  You no longer have a fever (without the use of medicine that reduces fevers) AND  other symptoms have improved (for example, when your cough or shortness of breath has improved) AND  you received two negative tests in a row, 24 hours apart. Your doctor will follow CDC guidelines. In all cases, follow the guidance of your healthcare provider and local health department. The decision to stop home isolation should be made in consultation with your healthcare provider and state and local health departments. Local decisions depend on local circumstances. [email or web address] 12/01/2018 This information is not intended to replace advice given to you by your health care provider. Make sure you discuss any questions you have with your health care provider. Document Released: 11/12/2018 Document Revised: 12/11/2018 Document Reviewed: 11/12/2018 Elsevier Patient Education  [place].   Pulmonary Embolism  A pulmonary embolism (PE) is a sudden blockage  or decrease of blood flow in one or both lungs. Most blockages come from a blood clot that forms in the vein of a lower leg, thigh, or arm (deep vein thrombosis, DVT) and travels to the lungs. A clot is blood that has thickened into a gel or solid. PE is a dangerous and life-threatening condition that needs to be treated right away. What are the causes? This condition is usually caused by a blood clot that forms in a vein and moves to the lungs. In rare cases, it may be caused by air, fat, part of a tumor, or other tissue that moves through the veins and into the lungs. What increases the risk? The following factors may make you more likely to develop this condition:  Experiencing a traumatic injury, such as breaking a hip or leg.  Having: ? A spinal cord injury. ? Orthopedic surgery, especially  hip or knee replacement. ? Any major surgery. ? A stroke. ? DVT. ? Blood clots or blood clotting disease. ? Long-term (chronic) lung or heart disease. ? Cancer treated with chemotherapy. ? A central venous catheter.  Taking medicines that contain estrogen. These include birth control pills and hormone replacement therapy.  Being: ? Pregnant. ? In the period of time after your baby is delivered (postpartum). ? Older than age 17. ? Overweight. ? A smoker, especially if you have other risks. What are the signs or symptoms? Symptoms of this condition usually start suddenly and include:  Shortness of breath during activity or at rest.  Coughing, coughing up blood, or coughing up blood-tinged mucus.  Chest pain that is often worse with deep breaths.  Rapid or irregular heartbeat.  Feeling light-headed or dizzy.  Fainting.  Feeling anxious.  Fever.  Sweating.  Pain and swelling in a leg. This is a symptom of DVT, which can lead to PE. How is this diagnosed? This condition may be diagnosed based on:  Your medical history.  A physical exam.  Blood tests.  CT pulmonary angiogram. This test checks blood flow in and around your lungs.  Ventilation-perfusion scan, also called a lung VQ scan. This test measures air flow and blood flow to the lungs.  An ultrasound of the legs. How is this treated? Treatment for this condition depends on many factors, such as the cause of your PE, your risk for bleeding or developing more clots, and other medical conditions you have. Treatment aims to remove, dissolve, or stop blood clots from forming or growing larger. Treatment may include:  Medicines, such as: ? Blood thinning medicines (anticoagulants) to stop clots from forming. ? Medicines that dissolve clots (thrombolytics).  Procedures, such as: ? Using a flexible tube to remove a blood clot (embolectomy) or to deliver medicine to destroy it (catheter-directed  thrombolysis). ? Inserting a filter into a large vein that carries blood to the heart (inferior vena cava). This filter (vena cava filter) catches blood clots before they reach the lungs. ? Surgery to remove the clot (surgical embolectomy). This is rare. You may need a combination of immediate, long-term (up to 3 months after diagnosis), and extended (more than 3 months after diagnosis) treatments. Your treatment may continue for several months (maintenance therapy). You and your health care provider will work together to choose the treatment program that is best for you. Follow these instructions at home: Medicines  Take over-the-counter and prescription medicines only as told by your health care provider.  If you are taking an anticoagulant medicine: ? Take the medicine every  day at the same time each day. ? Understand what foods and drugs interact with your medicine. ? Understand the side effects of this medicine, including excessive bruising or bleeding. Ask your health care provider or pharmacist about other side effects. General instructions  Wear a medical alert bracelet or carry a medical alert card that says you have had a PE and lists what medicines you take.  Ask your health care provider when you may return to your normal activities. Avoid sitting or lying for a long time without moving.  Maintain a healthy weight. Ask your health care provider what weight is healthy for you.  Do not use any products that contain nicotine or tobacco, such as cigarettes, e-cigarettes, and chewing tobacco. If you need help quitting, ask your health care provider.  Talk with your health care provider about any travel plans. It is important to make sure that you are still able to take your medicine while on trips.  Keep all follow-up visits as told by your health care provider. This is important. Contact a health care provider if:  You missed a dose of your blood thinner medicine. Get help right  away if:  You have: ? New or increased pain, swelling, warmth, or redness in an arm or leg. ? Numbness or tingling in an arm or leg. ? Shortness of breath during activity or at rest. ? A fever. ? Chest pain. ? A rapid or irregular heartbeat. ? A severe headache. ? Vision changes. ? A serious fall or accident, or you hit your head. ? Stomach (abdominal) pain. ? Blood in your vomit, stool, or urine. ? A cut that will not stop bleeding.  You cough up blood.  You feel light-headed or dizzy.  You cannot move your arms or legs.  You are confused or have memory loss. These symptoms may represent a serious problem that is an emergency. Do not wait to see if the symptoms will go away. Get medical help right away. Call your local emergency services (911 in the [place] Do not drive yourself to the hospital. Summary  A pulmonary embolism (PE) is a sudden blockage or decrease of blood flow in one or both lungs. PE is a dangerous and life-threatening condition that needs to be treated right away.  Treatments for this condition usually include medicines to thin your blood (anticoagulants) or medicines to break apart blood clots (thrombolytics).  If you are given blood thinners, it is important to take the medicine every day at the same time each day.  Understand what foods and drugs interact with any medicines that you are taking.  If you have signs of PE or DVT, call your local emergency services (911 in the [place] This information is not intended to replace advice given to you by your health care provider. Make sure you discuss any questions you have with your health care provider. Document Released: 07/14/2000 Document Revised: 04/24/2018 Document Reviewed: 04/24/2018 Elsevier Patient Education  2020 [employer].

## 2019-02-28 LAB — INTERLEUKIN-6, PLASMA: Interleukin-6, Plasma: 22.8 pg/mL — ABNORMAL HIGH (ref 0.0–12.2)

## 2019-05-05 ENCOUNTER — Inpatient Hospital Stay: Payer: Medicare HMO | Attending: Oncology | Admitting: Oncology

## 2019-05-05 ENCOUNTER — Encounter (INDEPENDENT_AMBULATORY_CARE_PROVIDER_SITE_OTHER): Payer: Self-pay

## 2019-05-05 ENCOUNTER — Inpatient Hospital Stay: Payer: Medicare HMO

## 2019-05-05 ENCOUNTER — Encounter: Payer: Self-pay | Admitting: Oncology

## 2019-05-05 ENCOUNTER — Other Ambulatory Visit: Payer: Self-pay

## 2019-05-05 VITALS — BP 126/84 | HR 77 | Temp 98.0°F | Resp 18 | Ht 68.0 in | Wt 259.2 lb

## 2019-05-05 DIAGNOSIS — Z8673 Personal history of transient ischemic attack (TIA), and cerebral infarction without residual deficits: Secondary | ICD-10-CM | POA: Diagnosis not present

## 2019-05-05 DIAGNOSIS — G4730 Sleep apnea, unspecified: Secondary | ICD-10-CM | POA: Diagnosis not present

## 2019-05-05 DIAGNOSIS — Z800 Family history of malignant neoplasm of digestive organs: Secondary | ICD-10-CM | POA: Insufficient documentation

## 2019-05-05 DIAGNOSIS — Z79899 Other long term (current) drug therapy: Secondary | ICD-10-CM | POA: Insufficient documentation

## 2019-05-05 DIAGNOSIS — M7989 Other specified soft tissue disorders: Secondary | ICD-10-CM | POA: Diagnosis not present

## 2019-05-05 DIAGNOSIS — Z7901 Long term (current) use of anticoagulants: Secondary | ICD-10-CM | POA: Diagnosis not present

## 2019-05-05 DIAGNOSIS — J45909 Unspecified asthma, uncomplicated: Secondary | ICD-10-CM | POA: Diagnosis not present

## 2019-05-05 DIAGNOSIS — I252 Old myocardial infarction: Secondary | ICD-10-CM | POA: Insufficient documentation

## 2019-05-05 DIAGNOSIS — I2609 Other pulmonary embolism with acute cor pulmonale: Secondary | ICD-10-CM | POA: Insufficient documentation

## 2019-05-05 DIAGNOSIS — Z86718 Personal history of other venous thrombosis and embolism: Secondary | ICD-10-CM | POA: Insufficient documentation

## 2019-05-05 DIAGNOSIS — E119 Type 2 diabetes mellitus without complications: Secondary | ICD-10-CM | POA: Diagnosis not present

## 2019-05-05 DIAGNOSIS — E785 Hyperlipidemia, unspecified: Secondary | ICD-10-CM | POA: Diagnosis not present

## 2019-05-05 DIAGNOSIS — Z8619 Personal history of other infectious and parasitic diseases: Secondary | ICD-10-CM | POA: Insufficient documentation

## 2019-05-05 DIAGNOSIS — I4891 Unspecified atrial fibrillation: Secondary | ICD-10-CM | POA: Insufficient documentation

## 2019-05-05 DIAGNOSIS — R600 Localized edema: Secondary | ICD-10-CM | POA: Diagnosis not present

## 2019-05-05 DIAGNOSIS — Z8249 Family history of ischemic heart disease and other diseases of the circulatory system: Secondary | ICD-10-CM | POA: Insufficient documentation

## 2019-05-05 DIAGNOSIS — R200 Anesthesia of skin: Secondary | ICD-10-CM | POA: Insufficient documentation

## 2019-05-05 DIAGNOSIS — Z85038 Personal history of other malignant neoplasm of large intestine: Secondary | ICD-10-CM | POA: Diagnosis not present

## 2019-05-05 DIAGNOSIS — Z9049 Acquired absence of other specified parts of digestive tract: Secondary | ICD-10-CM

## 2019-05-05 DIAGNOSIS — Z794 Long term (current) use of insulin: Secondary | ICD-10-CM | POA: Diagnosis not present

## 2019-05-05 LAB — CBC WITH DIFFERENTIAL/PLATELET
Abs Immature Granulocytes: 0.06 10*3/uL (ref 0.00–0.07)
Basophils Absolute: 0 10*3/uL (ref 0.0–0.1)
Basophils Relative: 0 %
Eosinophils Absolute: 0.2 10*3/uL (ref 0.0–0.5)
Eosinophils Relative: 3 %
HCT: 42.5 % (ref 39.0–52.0)
Hemoglobin: 14.6 g/dL (ref 13.0–17.0)
Immature Granulocytes: 1 %
Lymphocytes Relative: 31 %
Lymphs Abs: 2.1 10*3/uL (ref 0.7–4.0)
MCH: 31.3 pg (ref 26.0–34.0)
MCHC: 34.4 g/dL (ref 30.0–36.0)
MCV: 91 fL (ref 80.0–100.0)
Monocytes Absolute: 0.7 10*3/uL (ref 0.1–1.0)
Monocytes Relative: 11 %
Neutro Abs: 3.6 10*3/uL (ref 1.7–7.7)
Neutrophils Relative %: 54 %
Platelets: 206 10*3/uL (ref 150–400)
RBC: 4.67 MIL/uL (ref 4.22–5.81)
RDW: 12.7 % (ref 11.5–15.5)
WBC: 6.8 10*3/uL (ref 4.0–10.5)
nRBC: 0 % (ref 0.0–0.2)

## 2019-05-05 LAB — BASIC METABOLIC PANEL
Anion gap: 9 (ref 5–15)
BUN: 12 mg/dL (ref 6–20)
CO2: 27 mmol/L (ref 22–32)
Calcium: 9 mg/dL (ref 8.9–10.3)
Chloride: 108 mmol/L (ref 98–111)
Creatinine, Ser: 0.98 mg/dL (ref 0.61–1.24)
GFR calc Af Amer: >60 mL/min (ref >60)
GFR calc non Af Amer: >60 mL/min (ref >60)
Glucose, Bld: 166 mg/dL — ABNORMAL HIGH (ref 70–99)
Potassium: 4 mmol/L (ref 3.5–5.1)
Sodium: 144 mmol/L (ref 135–145)

## 2019-05-05 NOTE — Progress Notes (Signed)
Patient here for initial visit. Patient started on colace for constipation.

## 2019-05-05 NOTE — Progress Notes (Signed)
Hematology/Oncology Consult note [facility] Telephone:([phone] [phone]   Patient Care Team: Letta Median, MD as PCP - General  REFERRING PROVIDER: Letta Median, MD  CHIEF COMPLAINTS/REASON FOR VISIT:  Evaluation of pulmonary embolism.   HISTORY OF PRESENTING ILLNESS:  Michael Gill is a 58 y.o. male who was seen in consultation at the request of [name], Durene Cal, MD for evaluation of pulmonary embolism.  Patient was diagnosed with acute pulmonary embolism on 02/25/2019. Patient's presenting symptoms included: Acute shortness of breath 02/25/2019 CT angio chest showed pulmonary emboli arising from distal most aspect of the right main pulmonary artery extending into proximal right upper and lower lobe branches.  Small pulmonary emboli noted in several lower lobe pulmonary artery branches.  No right heart strain evident.  Multifocal groundglass type opacity consistent with multifocal pneumonia.  Multiple small lymph nodes which are slightly larger than prior study from 2017.  Spleen may well be somewhat enlarged. Patient was also positive for COVID 19.  Started on anticoagulation with heparin drip. Patient was also treated with antibiotics for pneumonia, also given remdesivir infusion and tocilizumab.  Symptoms improved and patient was discharged home with Eliquis.   Immobilization factors: Denies any immobilization factors prior to the presentation. History of previous thrombosis event: Patient had an episode of right lower extremity DVT in April 2016.  Patient was treated with Xarelto and eventually was stopped and switched to aspirin. Family history of thrombosis denies.  #Remote history of colon cancer 30 years ago.  Status post resection.  Family history positive for brother was diagnosed with colon cancer.  No other family history of cancer. Age appropriate cancer screening: Patient not sure if he is overdue for colonoscopy or  not.  Denies any bowel treatment or blood in the stool.  Today he reports shortness of breath has improved to his baseline.  He denies any hemoptysis, chest pain, abdominal pain, blood in the stool.  Chronic bilateral lower extremity swelling and numbness.  Review of Systems  Constitutional: Positive for fatigue. Negative for appetite change, chills, fever and unexpected weight change.  HENT:   Negative for hearing loss and voice change.   Eyes: Negative for eye problems and icterus.  Respiratory: Negative for chest tightness, cough and shortness of breath.   Cardiovascular: Positive for leg swelling. Negative for chest pain.  Gastrointestinal: Negative for abdominal distention and abdominal pain.  Endocrine: Negative for hot flashes.  Genitourinary: Negative for difficulty urinating, dysuria and frequency.   Musculoskeletal: Negative for arthralgias.  Skin: Negative for itching and rash.  Neurological: Positive for numbness. Negative for light-headedness.  Hematological: Negative for adenopathy. Does not bruise/bleed easily.  Psychiatric/Behavioral: Negative for confusion.    MEDICAL HISTORY:  Past Medical History:  Diagnosis Date   Asthma    B12 deficiency    in the past   CAD (coronary artery disease)    s/p MI   Colon cancer ([place]) 0000000   Complication of anesthesia    long time to wake up from colonoscopy.   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   Seizure ([place])    last episode 08/2017. has no warning, will just happen. 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    difficulty forming words at times   Stroke [facility]) 2012, 03/2018   residual tremor left hand, facial droop   Tremors of nervous system    specifically left hand, since stroke    SURGICAL HISTORY: Past Surgical History:  Procedure Laterality Date   APPENDECTOMY  1971   CARDIAC CATHETERIZATION Left 10/12/2015   Procedure: Left Heart Cath and Coronary Angiography;  Surgeon: Yolonda Kida, MD;  Location: [facility];  Service: Cardiovascular;  Laterality: Left;   TONSILLECTOMY      SOCIAL HISTORY: Social History   Socioeconomic History   Marital status: Married    Spouse name: Butch Penny   Number of children: Not on file   Years of education: Not on file   Highest education level: Not on file  Occupational History   Occupation: retired    Comment: disabled  Scientist, product/process development strain: Not on file   Food insecurity    Worry: Not on file    Inability: Not on Lexicographer needs    Medical: Not on file    Non-medical: Not on file  Tobacco Use   Smoking status: Never Smoker   Smokeless tobacco: Never Used  Substance and Sexual Activity   Alcohol use: No   Drug use: No   Sexual activity: Not on file  Lifestyle   Physical activity    Days per week: Not on file    Minutes per session: Not on file   Stress: Not on file  Relationships   Social connections    Talks on phone: Not on file    Gets together: Not on file    Attends religious service: Not on file    Active member of club or organization: Not on file    Attends meetings of clubs or organizations: Not on file    Relationship status: Not on file   Intimate partner violence    Fear of  current or ex partner: Not on file    Emotionally abused: Not on file    Physically abused: Not on file    Forced sexual activity: Not on file  Other Topics Concern   Not on file  Social History Narrative   Not working, disabled. Some deficits d/t prior stroke. History of seizures, MI and TIA   Wife is very supportive    FAMILY HISTORY: Family History  Problem Relation Age of Onset   Alzheimer's disease Mother    Congestive Heart Failure Father    Cancer Brother     ALLERGIES:  is allergic to shellfish allergy; codeine; and sulfa antibiotics.  MEDICATIONS:  Current Outpatient Medications  Medication Sig Dispense Refill   acetaminophen (TYLENOL) 500 MG tablet Take 500 mg by mouth every 6 (six) hours as needed for moderate pain.     albuterol (PROVENTIL HFA;VENTOLIN HFA) 108 (90 Base) MCG/ACT inhaler Inhale 1-2 puffs into the lungs every 4 (four) hours as needed for wheezing or shortness  of breath.     apixaban (ELIQUIS) 5 MG TABS tablet Take 5 mg by mouth 2 (two) times daily.     atorvastatin (LIPITOR) 10 MG tablet Take 10 mg by mouth daily.     bismuth subsalicylate (PEPTO BISMOL) 262 MG chewable tablet Chew 524 mg by mouth as needed for indigestion or diarrhea or loose stools.      cetirizine (ZYRTEC) 10 MG tablet Take 10 mg by mouth daily.     docusate sodium (COLACE) 100 MG capsule Take 100 mg by mouth 2 (two) times daily.     fluticasone (FLONASE) 50 MCG/ACT nasal spray Place 1 spray into both nostrils daily as needed for allergies or rhinitis.     gabapentin (NEURONTIN) 100 MG capsule Take 100 mg by mouth 3 (three) times daily.      insulin aspart (NOVOLOG) 100 UNIT/ML injection Inject 10 Units into the skin 3 (three) times daily with meals.     insulin glargine (LANTUS) 100 UNIT/ML injection Inject 50 Units into the skin at bedtime.      meclizine (ANTIVERT) 25 MG tablet Take 25 mg by mouth 3 (three) times daily as needed for dizziness.     omeprazole  (PRILOSEC) 40 MG capsule Take 40 mg by mouth daily as needed (GERD symptoms).      apixaban (ELIQUIS) 5 MG TABS tablet Take 2 tablets (10 mg total) by mouth 2 (two) times daily for 6 days. 24 tablet 0   apixaban (ELIQUIS) 5 MG TABS tablet Take 1 tablet (5 mg total) by mouth 2 (two) times daily. 60 tablet 0   IBU 600 MG tablet      No current facility-administered medications for this visit.      PHYSICAL EXAMINATION: ECOG PERFORMANCE STATUS: 1 - Symptomatic but completely ambulatory Vitals:   05/05/19 1456  BP: 126/84  Pulse: 77  Resp: 18  Temp: 98 F (36.7 C)   Filed Weights   05/05/19 1456  Weight: 259 lb 3.2 oz (117.6 kg)    Physical Exam Constitutional:      General: He is not in acute distress.    Appearance: He is obese.  HENT:     Head: Normocephalic and atraumatic.  Eyes:     General: No scleral icterus.    Pupils: Pupils are equal, round, and reactive to light.  Neck:     Musculoskeletal: Normal range of motion and neck supple.  Cardiovascular:     Rate and Rhythm: Normal rate and regular rhythm.     Heart sounds: Normal heart sounds.  Pulmonary:     Effort: Pulmonary effort is normal. No respiratory distress.     Breath sounds: No wheezing.  Abdominal:     General: Bowel sounds are normal. There is no distension.     Palpations: Abdomen is soft. There is no mass.     Tenderness: There is no abdominal tenderness.  Musculoskeletal: Normal range of motion.        General: No deformity.     Comments: Bilateral lower extremity edema 1+  Skin:    General: Skin is warm and dry.     Findings: No erythema or rash.  Neurological:     Mental Status: He is alert and oriented to person, place, and time.     Cranial Nerves: No cranial nerve deficit.     Coordination: Coordination normal.  Psychiatric:        Behavior: Behavior normal.        Thought Content: Thought  content normal.     RADIOGRAPHIC STUDIES: I have personally reviewed the radiological  images as listed and agreed with the findings in the report. No results found.   LABORATORY DATA:  I have reviewed the data as listed Lab Results  Component Value Date   WBC 6.8 05/05/2019   HGB 14.6 05/05/2019   HCT 42.5 05/05/2019   MCV 91.0 05/05/2019   PLT 206 05/05/2019   Recent Labs    01/30/19 0012  02/25/19 1225 02/25/19 1735 02/26/19 0353 02/27/19 0658 05/05/19 1556  NA 134*   < > 137  --  135  --  144  K 4.5   < > 3.9  --  4.5  --  4.0  CL 101   < > 106  --  106  --  108  CO2 23   < > 23  --  21*  --  27  GLUCOSE 244*   < > 125*  --  339*  --  166*  BUN 20   < > 11  --  17  --  12  CREATININE 1.48*   < > 0.89  --  0.92 0.82 0.98  CALCIUM 8.4*   < > 8.3*  --  8.0*  --  9.0  GFRNONAA 51*   < > >60  --  >60 >60 >60  GFRAA 59*   < > >60  --  >60 >60 >60  PROT 6.8  --   --  6.6  --   --   --   ALBUMIN 3.5  --   --  3.4*  --   --   --   AST 17  --   --  16  --   --   --   ALT 20  --   --  19  --   --   --   ALKPHOS 52  --   --  67  --   --   --   BILITOT 2.6*  --   --  0.9  --   --   --   BILIDIR  --   --   --  0.1  --   --   --   IBILI  --   --   --  0.8  --   --   --    < > = values in this interval not displayed.   Iron/TIBC/Ferritin/ %Sat    Component Value Date/Time   FERRITIN 405 (H) 02/27/2019 [id number]       ASSESSMENT & PLAN:  1. Other acute pulmonary embolism with acute cor pulmonale ([place])   2. 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  I recommend long-term anticoagulation. He has finished about 2+ months of Eliquis 5 mg twice daily.  Recommend patient to finish a total of 6 months of full dose Eliquis 5 mg twice daily.  At that point if he is doing well, will decrease Eliquis dose to 2.5 mg twice daily for long-term maintenance regimen. Check CBC, CMP today. Check factor V Leiden mutation and prothrombin mutation.  History of  colon cancer at age of 33, family history positive for colon cancer in brother.  Recommend patient to discuss with genetic counselor.  Will refer.  Orders Placed This Encounter  Procedures   CBC with Differential/Platelet    Standing Status:   Future  Number of Occurrences:   1    Standing Expiration Date:   05/04/2020   Factor 5 leiden    Standing Status:   Future    Number of Occurrences:   1    Standing Expiration Date:   05/04/2020   Prothrombin gene mutation    Standing Status:   Future    Number of Occurrences:   1    Standing Expiration Date:   [id number]   Basic metabolic panel    Standing Status:   Future    Number of Occurrences:   1    Standing Expiration Date:   05/04/2020   CBC with Differential/Platelet    Standing Status:   Future    Standing Expiration Date:   05/04/2020   Comprehensive metabolic panel    Standing Status:   Future    Standing Expiration Date:   05/04/2020   Ambulatory referral to Genetics    Referral Priority:   Routine    Referral Type:   Consultation    Referral Reason:   Specialty Services Required    Referred to Provider:   Faith Rogue T    Number of Visits Requested:   1    All questions were answered. The patient knows to call the clinic with any problems questions or concerns.  Cc [name], Durene Cal, MD   Return of visit: 3 months. Thank you for this kind referral and the opportunity to participate in the care of this patient. A copy of today's note is routed to referring provider  Total face to face encounter time for this patient visit was 45 min. >50% of the time was  spent in counseling and coordination of care.    Earlie Server, MD, PhD 05/05/2019

## 2019-05-06 MED ORDER — APIXABAN 5 MG PO TABS: 5.0000 mg | ORAL_TABLET | Freq: Two times a day (BID) | ORAL | 3 refills | Status: DC

## 2019-05-06 NOTE — Addendum Note (Signed)
Addended by: Earlie Server on: 05/06/2019 10:01 AM   Modules accepted: Orders

## 2019-05-07 ENCOUNTER — Other Ambulatory Visit: Payer: Self-pay

## 2019-05-07 ENCOUNTER — Emergency Department: Payer: Medicare HMO

## 2019-05-07 ENCOUNTER — Encounter: Payer: Self-pay | Admitting: Emergency Medicine

## 2019-05-07 ENCOUNTER — Emergency Department
Admission: EM | Admit: 2019-05-07 | Discharge: 2019-05-07 | Disposition: A | Discharge status: Home / Self Care | Payer: Medicare HMO | Attending: Student in an Organized Health Care Education/Training Program | Admitting: Student in an Organized Health Care Education/Training Program

## 2019-05-07 DIAGNOSIS — E119 Type 2 diabetes mellitus without complications: Secondary | ICD-10-CM | POA: Diagnosis not present

## 2019-05-07 DIAGNOSIS — Z79899 Other long term (current) drug therapy: Secondary | ICD-10-CM | POA: Diagnosis not present

## 2019-05-07 DIAGNOSIS — R109 Unspecified abdominal pain: Secondary | ICD-10-CM | POA: Diagnosis not present

## 2019-05-07 DIAGNOSIS — Z7901 Long term (current) use of anticoagulants: Secondary | ICD-10-CM | POA: Diagnosis not present

## 2019-05-07 DIAGNOSIS — Z86718 Personal history of other venous thrombosis and embolism: Secondary | ICD-10-CM | POA: Diagnosis not present

## 2019-05-07 DIAGNOSIS — M542 Cervicalgia: Secondary | ICD-10-CM | POA: Diagnosis present

## 2019-05-07 DIAGNOSIS — I2510 Atherosclerotic heart disease of native coronary artery without angina pectoris: Secondary | ICD-10-CM | POA: Insufficient documentation

## 2019-05-07 DIAGNOSIS — Z86711 Personal history of pulmonary embolism: Secondary | ICD-10-CM | POA: Diagnosis not present

## 2019-05-07 DIAGNOSIS — J45909 Unspecified asthma, uncomplicated: Secondary | ICD-10-CM | POA: Diagnosis not present

## 2019-05-07 LAB — COMPREHENSIVE METABOLIC PANEL
ALT: 20 U/L (ref 0–44)
AST: 18 U/L (ref 15–41)
Albumin: 3.8 g/dL (ref 3.5–5.0)
Alkaline Phosphatase: 46 U/L (ref 38–126)
Anion gap: 8 (ref 5–15)
BUN: 17 mg/dL (ref 6–20)
CO2: 23 mmol/L (ref 22–32)
Calcium: 9.1 mg/dL (ref 8.9–10.3)
Chloride: 107 mmol/L (ref 98–111)
Creatinine, Ser: 1 mg/dL (ref 0.61–1.24)
GFR calc Af Amer: >60 mL/min (ref >60)
GFR calc non Af Amer: >60 mL/min (ref >60)
Glucose, Bld: 235 mg/dL — ABNORMAL HIGH (ref 70–99)
Potassium: 4.1 mmol/L (ref 3.5–5.1)
Sodium: 138 mmol/L (ref 135–145)
Total Bilirubin: 0.7 mg/dL (ref 0.3–1.2)
Total Protein: 6.4 g/dL — ABNORMAL LOW (ref 6.5–8.1)

## 2019-05-07 LAB — CBC WITH DIFFERENTIAL/PLATELET
Abs Immature Granulocytes: 0.05 10*3/uL (ref 0.00–0.07)
Basophils Absolute: 0 10*3/uL (ref 0.0–0.1)
Basophils Relative: 0 %
Eosinophils Absolute: 0.2 10*3/uL (ref 0.0–0.5)
Eosinophils Relative: 3 %
HCT: 42.7 % (ref 39.0–52.0)
Hemoglobin: 14.7 g/dL (ref 13.0–17.0)
Immature Granulocytes: 1 %
Lymphocytes Relative: 29 %
Lymphs Abs: 1.9 10*3/uL (ref 0.7–4.0)
MCH: 31 pg (ref 26.0–34.0)
MCHC: 34.4 g/dL (ref 30.0–36.0)
MCV: 90.1 fL (ref 80.0–100.0)
Monocytes Absolute: 0.7 10*3/uL (ref 0.1–1.0)
Monocytes Relative: 11 %
Neutro Abs: 3.6 10*3/uL (ref 1.7–7.7)
Neutrophils Relative %: 56 %
Platelets: 186 10*3/uL (ref 150–400)
RBC: 4.74 MIL/uL (ref 4.22–5.81)
RDW: 12.5 % (ref 11.5–15.5)
WBC: 6.5 10*3/uL (ref 4.0–10.5)
nRBC: 0 % (ref 0.0–0.2)

## 2019-05-07 MED ORDER — BUTALBITAL-APAP-CAFFEINE 50-325-40 MG PO TABS
2.0000 | ORAL_TABLET | Freq: Once | ORAL | Status: AC
  Administered 2019-05-07: 2 via ORAL
  Filled 2019-05-07: qty 2

## 2019-05-07 MED ORDER — CYCLOBENZAPRINE HCL 5 MG PO TABS: 5.0000 mg | ORAL_TABLET | Freq: Three times a day (TID) | ORAL | 0 refills | Status: DC | PRN

## 2019-05-07 MED ORDER — CYCLOBENZAPRINE HCL 10 MG PO TABS: 10.0000 mg | ORAL_TABLET | Freq: Three times a day (TID) | ORAL | 0 refills | Status: DC | PRN

## 2019-05-07 MED ORDER — IOHEXOL 300 MG/ML  SOLN
100.0000 mL | Freq: Once | INTRAMUSCULAR | Status: AC | PRN
  Administered 2019-05-07: 100 mL via INTRAVENOUS
  Filled 2019-05-07: qty 100
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
## 2019-05-07 NOTE — ED Notes (Signed)
EDP Robinson at bedside.  
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
## 2019-05-07 NOTE — ED Notes (Signed)
Lav/Grn/blue tubes sent to lab.

## 2019-05-07 NOTE — ED Notes (Signed)
Pt standing at bedside to use urinal. Will d/c once pt done.

## 2019-05-07 NOTE — ED Notes (Addendum)
Pt c/o back, head, and neck pain. Also c/o nausea and 7/10 L chest pain. Denies LOC.

## 2019-05-07 NOTE — ED Notes (Signed)
EKG to [place]. NSR.

## 2019-05-12 LAB — FACTOR 5 LEIDEN

## 2019-05-12 LAB — PROTHROMBIN GENE MUTATION

## 2019-05-31 ENCOUNTER — Ambulatory Visit: Admit: 2019-05-31 | Payer: Medicare HMO | Admitting: Surgery

## 2019-05-31 SURGERY — EXCISION LIPOMA
Anesthesia: General | Laterality: Bilateral

## 2019-07-31 ENCOUNTER — Other Ambulatory Visit: Payer: Self-pay

## 2019-07-31 ENCOUNTER — Inpatient Hospital Stay: Payer: Medicare HMO | Attending: Oncology

## 2019-07-31 DIAGNOSIS — Z85038 Personal history of other malignant neoplasm of large intestine: Secondary | ICD-10-CM

## 2019-07-31 DIAGNOSIS — I2609 Other pulmonary embolism with acute cor pulmonale: Secondary | ICD-10-CM

## 2019-07-31 DIAGNOSIS — Z7901 Long term (current) use of anticoagulants: Secondary | ICD-10-CM | POA: Insufficient documentation

## 2019-07-31 DIAGNOSIS — Z86718 Personal history of other venous thrombosis and embolism: Secondary | ICD-10-CM | POA: Diagnosis not present

## 2019-07-31 LAB — CBC WITH DIFFERENTIAL/PLATELET
Abs Immature Granulocytes: 0.03 10*3/uL (ref 0.00–0.07)
Basophils Absolute: 0 10*3/uL (ref 0.0–0.1)
Basophils Relative: 0 %
Eosinophils Absolute: 0.2 10*3/uL (ref 0.0–0.5)
Eosinophils Relative: 3 %
HCT: 46.5 % (ref 39.0–52.0)
Hemoglobin: 15.6 g/dL (ref 13.0–17.0)
Immature Granulocytes: 0 %
Lymphocytes Relative: 28 %
Lymphs Abs: 2 10*3/uL (ref 0.7–4.0)
MCH: 30.3 pg (ref 26.0–34.0)
MCHC: 33.5 g/dL (ref 30.0–36.0)
MCV: 90.3 fL (ref 80.0–100.0)
Monocytes Absolute: 0.8 10*3/uL (ref 0.1–1.0)
Monocytes Relative: 11 %
Neutro Abs: 4.2 10*3/uL (ref 1.7–7.7)
Neutrophils Relative %: 58 %
Platelets: 212 10*3/uL (ref 150–400)
RBC: 5.15 MIL/uL (ref 4.22–5.81)
RDW: 12.1 % (ref 11.5–15.5)
WBC: 7.3 10*3/uL (ref 4.0–10.5)
nRBC: 0 % (ref 0.0–0.2)

## 2019-07-31 LAB — COMPREHENSIVE METABOLIC PANEL
ALT: 20 U/L (ref 0–44)
AST: 17 U/L (ref 15–41)
Albumin: 3.9 g/dL (ref 3.5–5.0)
Alkaline Phosphatase: 52 U/L (ref 38–126)
Anion gap: 7 (ref 5–15)
BUN: 17 mg/dL (ref 6–20)
CO2: 25 mmol/L (ref 22–32)
Calcium: 9 mg/dL (ref 8.9–10.3)
Chloride: 107 mmol/L (ref 98–111)
Creatinine, Ser: 0.88 mg/dL (ref 0.61–1.24)
GFR calc Af Amer: >60 mL/min (ref >60)
GFR calc non Af Amer: >60 mL/min (ref >60)
Glucose, Bld: 130 mg/dL — ABNORMAL HIGH (ref 70–99)
Potassium: 4.2 mmol/L (ref 3.5–5.1)
Sodium: 139 mmol/L (ref 135–145)
Total Bilirubin: 0.8 mg/dL (ref 0.3–1.2)
Total Protein: 7 g/dL (ref 6.5–8.1)

## 2019-07-31 LAB — FIBRIN DERIVATIVES D-DIMER (ARMC ONLY): Fibrin derivatives D-dimer (ARMC): 350.75 ng/mL (FEU) (ref 0.00–499.00)

## 2019-08-04 ENCOUNTER — Inpatient Hospital Stay: Payer: Medicare HMO

## 2019-08-04 ENCOUNTER — Encounter: Payer: Self-pay | Admitting: Oncology

## 2019-08-04 ENCOUNTER — Inpatient Hospital Stay: Payer: Medicare HMO | Attending: Oncology | Admitting: Oncology

## 2019-08-04 DIAGNOSIS — Z85038 Personal history of other malignant neoplasm of large intestine: Secondary | ICD-10-CM | POA: Diagnosis not present

## 2019-08-04 DIAGNOSIS — Z7901 Long term (current) use of anticoagulants: Secondary | ICD-10-CM

## 2019-08-04 DIAGNOSIS — Z86711 Personal history of pulmonary embolism: Secondary | ICD-10-CM | POA: Diagnosis not present

## 2019-08-04 MED ORDER — APIXABAN 2.5 MG PO TABS: 2.5000 mg | ORAL_TABLET | Freq: Two times a day (BID) | ORAL | 3 refills | Status: DC

## 2019-08-04 NOTE — Progress Notes (Signed)
Patient verified using two identifiers for virtual visit via telephone today.  Patient does not offer any problems today.  

## 2019-08-05 DIAGNOSIS — Z7901 Long term (current) use of anticoagulants: Secondary | ICD-10-CM | POA: Insufficient documentation

## 2019-08-05 DIAGNOSIS — Z85038 Personal history of other malignant neoplasm of large intestine: Secondary | ICD-10-CM | POA: Insufficient documentation

## 2019-08-05 DIAGNOSIS — Z86711 Personal history of pulmonary embolism: Secondary | ICD-10-CM | POA: Insufficient documentation
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
## 2019-09-25 ENCOUNTER — Other Ambulatory Visit: Payer: Self-pay

## 2019-09-25 ENCOUNTER — Inpatient Hospital Stay: Payer: Medicare HMO | Admitting: Licensed Clinical Social Worker

## 2019-12-16 ENCOUNTER — Other Ambulatory Visit: Payer: Self-pay | Admitting: Oncology

## 2020-01-26 ENCOUNTER — Other Ambulatory Visit: Payer: Self-pay

## 2020-01-26 DIAGNOSIS — Z85038 Personal history of other malignant neoplasm of large intestine: Secondary | ICD-10-CM

## 2020-01-26 DIAGNOSIS — Z86711 Personal history of pulmonary embolism: Secondary | ICD-10-CM

## 2020-01-27 ENCOUNTER — Inpatient Hospital Stay: Payer: Medicare HMO | Attending: Oncology

## 2020-01-27 ENCOUNTER — Other Ambulatory Visit: Payer: Self-pay

## 2020-01-27 DIAGNOSIS — Z86711 Personal history of pulmonary embolism: Secondary | ICD-10-CM

## 2020-01-27 DIAGNOSIS — Z86718 Personal history of other venous thrombosis and embolism: Secondary | ICD-10-CM | POA: Insufficient documentation

## 2020-01-27 DIAGNOSIS — Z85038 Personal history of other malignant neoplasm of large intestine: Secondary | ICD-10-CM

## 2020-01-27 LAB — CBC WITH DIFFERENTIAL/PLATELET
Abs Immature Granulocytes: 0.06 10*3/uL (ref 0.00–0.07)
Basophils Absolute: 0 10*3/uL (ref 0.0–0.1)
Basophils Relative: 0 %
Eosinophils Absolute: 0.1 10*3/uL (ref 0.0–0.5)
Eosinophils Relative: 2 %
HCT: 45 % (ref 39.0–52.0)
Hemoglobin: 15.8 g/dL (ref 13.0–17.0)
Immature Granulocytes: 1 %
Lymphocytes Relative: 26 %
Lymphs Abs: 2.1 10*3/uL (ref 0.7–4.0)
MCH: 31 pg (ref 26.0–34.0)
MCHC: 35.1 g/dL (ref 30.0–36.0)
MCV: 88.2 fL (ref 80.0–100.0)
Monocytes Absolute: 0.7 10*3/uL (ref 0.1–1.0)
Monocytes Relative: 9 %
Neutro Abs: 5.1 10*3/uL (ref 1.7–7.7)
Neutrophils Relative %: 62 %
Platelets: 216 10*3/uL (ref 150–400)
RBC: 5.1 MIL/uL (ref 4.22–5.81)
RDW: 12.4 % (ref 11.5–15.5)
WBC: 8.1 10*3/uL (ref 4.0–10.5)
nRBC: 0 % (ref 0.0–0.2)

## 2020-01-27 LAB — COMPREHENSIVE METABOLIC PANEL
ALT: 21 U/L (ref 0–44)
AST: 16 U/L (ref 15–41)
Albumin: 3.6 g/dL (ref 3.5–5.0)
Alkaline Phosphatase: 62 U/L (ref 38–126)
Anion gap: 8 (ref 5–15)
BUN: 18 mg/dL (ref 6–20)
CO2: 25 mmol/L (ref 22–32)
Calcium: 9.1 mg/dL (ref 8.9–10.3)
Chloride: 105 mmol/L (ref 98–111)
Creatinine, Ser: 1.04 mg/dL (ref 0.61–1.24)
GFR calc Af Amer: >60 mL/min (ref >60)
GFR calc non Af Amer: >60 mL/min (ref >60)
Glucose, Bld: 179 mg/dL — ABNORMAL HIGH (ref 70–99)
Potassium: 4.3 mmol/L (ref 3.5–5.1)
Sodium: 138 mmol/L (ref 135–145)
Total Bilirubin: 0.8 mg/dL (ref 0.3–1.2)
Total Protein: 6.9 g/dL (ref 6.5–8.1)

## 2020-02-03 ENCOUNTER — Other Ambulatory Visit: Payer: Self-pay

## 2020-02-03 ENCOUNTER — Other Ambulatory Visit: Payer: Medicare HMO

## 2020-02-03 ENCOUNTER — Inpatient Hospital Stay: Payer: Medicare HMO | Admitting: Oncology

## 2020-02-11 ENCOUNTER — Inpatient Hospital Stay: Payer: Medicare HMO | Attending: Oncology | Admitting: Oncology

## 2020-02-11 DIAGNOSIS — I2510 Atherosclerotic heart disease of native coronary artery without angina pectoris: Secondary | ICD-10-CM | POA: Insufficient documentation

## 2020-02-11 DIAGNOSIS — Z86711 Personal history of pulmonary embolism: Secondary | ICD-10-CM | POA: Insufficient documentation

## 2020-02-11 DIAGNOSIS — E785 Hyperlipidemia, unspecified: Secondary | ICD-10-CM | POA: Insufficient documentation

## 2020-02-11 DIAGNOSIS — I252 Old myocardial infarction: Secondary | ICD-10-CM | POA: Insufficient documentation

## 2020-02-11 DIAGNOSIS — Z79899 Other long term (current) drug therapy: Secondary | ICD-10-CM | POA: Insufficient documentation

## 2020-02-11 DIAGNOSIS — E538 Deficiency of other specified B group vitamins: Secondary | ICD-10-CM | POA: Insufficient documentation

## 2020-02-11 DIAGNOSIS — Z86718 Personal history of other venous thrombosis and embolism: Secondary | ICD-10-CM | POA: Insufficient documentation

## 2020-02-11 DIAGNOSIS — Z85038 Personal history of other malignant neoplasm of large intestine: Secondary | ICD-10-CM | POA: Insufficient documentation

## 2020-02-11 DIAGNOSIS — K219 Gastro-esophageal reflux disease without esophagitis: Secondary | ICD-10-CM | POA: Insufficient documentation

## 2020-02-11 DIAGNOSIS — Z7901 Long term (current) use of anticoagulants: Secondary | ICD-10-CM | POA: Insufficient documentation

## 2020-02-11 DIAGNOSIS — Z8673 Personal history of transient ischemic attack (TIA), and cerebral infarction without residual deficits: Secondary | ICD-10-CM | POA: Insufficient documentation

## 2020-02-11 DIAGNOSIS — Z794 Long term (current) use of insulin: Secondary | ICD-10-CM | POA: Insufficient documentation

## 2020-02-11 DIAGNOSIS — I4891 Unspecified atrial fibrillation: Secondary | ICD-10-CM | POA: Insufficient documentation

## 2020-02-11 DIAGNOSIS — E119 Type 2 diabetes mellitus without complications: Secondary | ICD-10-CM | POA: Insufficient documentation

## 2020-02-11 DIAGNOSIS — G4730 Sleep apnea, unspecified: Secondary | ICD-10-CM | POA: Insufficient documentation

## 2020-02-11 DIAGNOSIS — J45909 Unspecified asthma, uncomplicated: Secondary | ICD-10-CM | POA: Insufficient documentation

## 2020-02-11 DIAGNOSIS — Z800 Family history of malignant neoplasm of digestive organs: Secondary | ICD-10-CM | POA: Insufficient documentation

## 2020-02-11 DIAGNOSIS — Z8249 Family history of ischemic heart disease and other diseases of the circulatory system: Secondary | ICD-10-CM | POA: Insufficient documentation

## 2020-02-23 ENCOUNTER — Other Ambulatory Visit: Payer: Self-pay | Admitting: Oncology

## 2020-02-23 NOTE — Telephone Encounter (Signed)
Done.. MyChart visit has been R/S for tomorrow  Pt is aware

## 2020-02-23 NOTE — Telephone Encounter (Signed)
Pt will need mychart visit rescheduled before medication can be refilled.

## 2020-02-24 ENCOUNTER — Encounter: Payer: Self-pay | Admitting: Oncology

## 2020-02-24 ENCOUNTER — Inpatient Hospital Stay (HOSPITAL_BASED_OUTPATIENT_CLINIC_OR_DEPARTMENT_OTHER): Payer: Medicare HMO | Admitting: Oncology

## 2020-02-24 DIAGNOSIS — Z86711 Personal history of pulmonary embolism: Secondary | ICD-10-CM | POA: Diagnosis not present

## 2020-02-24 DIAGNOSIS — Z85038 Personal history of other malignant neoplasm of large intestine: Secondary | ICD-10-CM | POA: Diagnosis not present

## 2020-02-24 NOTE — Progress Notes (Signed)
Pt contacted for follow up mychart visit. Pt reports having new "places/knots" on his face. He will be referred to dermatologist by PCP. There is especially a prominent spot on top of his forehead that he is concerned about. Pt requesting refill on eliquis, he has been out since Friday.

## 2020-02-24 NOTE — Progress Notes (Signed)
HEMATOLOGY-ONCOLOGY TeleHEALTH VISIT PROGRESS NOTE  I connected with Michael Gill on 02/24/20 at  2:00 PM EDT by video enabled telemedicine visit and verified that I am speaking with the correct person using two identifiers. I discussed the limitations, risks, security and privacy concerns of performing an evaluation and management service by telemedicine and the availability of in-person appointments. I also discussed with the patient that there may be a patient responsible charge related to this service. The patient expressed understanding and agreed to proceed.   Other persons participating in the visit and their role in the encounter:  None  Patient's location: Home  Provider's location: office Chief Complaint: Follow-up for PE   INTERVAL HISTORY Michael Gill is a 59 y.o. male who has above history reviewed by me today presents for follow up visit for pulmonary embolism. Problems and complaints are listed below:  Patient reports feeling well. He has developed some skin concern on her forehead and scalp for which he is going to establish care with dermatology for further evaluation. Patient takes Eliquis 2.5 mg twice daily.  Tolerates well.  Denies any bleeding events.  Review of Systems  Constitutional: Negative for appetite change, chills, fatigue, fever and unexpected weight change.  HENT:   Negative for hearing loss and voice change.   Eyes: Negative for eye problems and icterus.  Respiratory: Negative for chest tightness, cough and shortness of breath.   Cardiovascular: Negative for chest pain and leg swelling.  Gastrointestinal: Negative for abdominal distention and abdominal pain.  Endocrine: Negative for hot flashes.  Genitourinary: Negative for difficulty urinating, dysuria and frequency.   Musculoskeletal: Negative for arthralgias.  Skin: Negative for itching and rash.  Neurological: Negative for light-headedness and numbness.  Hematological: Negative for adenopathy. Does  not bruise/bleed easily.  Psychiatric/Behavioral: Negative for confusion.    Past Medical History:  Diagnosis Date  . Asthma   . B12 deficiency    in the past  . CAD (coronary artery disease)    s/p MI  . Colon cancer ([place]) 1990  . Complication of anesthesia    long time to wake up from colonoscopy.  . Conversion disorder    difficulty with visual disturbances and changes with unknown etiology, along with headaches  . CVA (cerebral infarction)   . 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 DVT (deep venous thrombosis) ([place]) 2016   behind right knee  . Dyspnea 09/2018   with exercise  . Dysrhythmia    has had Afib in past.  . GERD (gastroesophageal reflux disease)   . History of kidney stones 1990  . Hx of therapeutic radiation   . Hyperlipidemia   . Myocardial infarction ([place]) 1990  . Neuropathic pain, leg   . 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  . Seizure ([place])    last episode 08/2017. has no warning, will just happen. nothing specific precipitates event  . Sleep apnea    does not use cpap  . 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 Stroke [facility]) 2012, 03/2018   residual tremor left hand, facial droop  . Tremors of nervous system    specifically left hand, since stroke   Past Surgical History:  Procedure Laterality Date  . APPENDECTOMY  1971  . CARDIAC CATHETERIZATION Left 10/12/2015   Procedure: Left Heart Cath and Coronary Angiography;  Surgeon:  Yolonda Kida, MD;  Location: [place] [facility];  Service: Cardiovascular;  Laterality: Left;  . TONSILLECTOMY      Family History  Problem Relation Age of Onset  . Alzheimer's disease Mother   . Congestive Heart Failure Father   . Colon cancer Brother     Social History   Socioeconomic History  . Marital status: Married    Spouse name: Butch Penny  . Number of children: Not on file  .  Years of education: Not on file  . Highest education level: Not on file  Occupational History  . Occupation: retired    Comment: disabled  Tobacco Use  . Smoking status: Never Smoker  . Smokeless tobacco: Never Used  Vaping Use  . Vaping Use: Never used  Substance and Sexual Activity  . Alcohol use: No  . Drug use: No  . Sexual activity: Not on file  Other Topics Concern  . Not on file  Social History Narrative   Not working, disabled. Some deficits d/t prior stroke. History of seizures, MI and TIA   Wife is very supportive   Social Determinants of Radio broadcast assistant Strain:   . Difficulty of Paying Living Expenses:   Food Insecurity:   . Worried About Charity fundraiser in the Last Year:   . Arboriculturist in the Last Year:   Transportation Needs:   . Film/video editor (Medical):   Marland Kitchen Lack of Transportation (Non-Medical):   Physical Activity:   . Days of Exercise per Week:   . Minutes of Exercise per Session:   Stress:   . Feeling of Stress :   Social Connections:   . Frequency of Communication with Friends and Family:   . Frequency of Social Gatherings with Friends and Family:   . Attends Religious Services:   . Active Member of Clubs or Organizations:   . Attends Archivist Meetings:   Marland Kitchen Marital Status:   Intimate Partner Violence:   . Fear of Current or Ex-Partner:   . Emotionally Abused:   Marland Kitchen Physically Abused:   . Sexually Abused:     Current Outpatient Medications on File Prior to Visit  Medication Sig Dispense Refill  . acetaminophen (TYLENOL) 500 MG tablet Take 500 mg by mouth every 6 (six) hours as needed for moderate pain.    Marland Kitchen albuterol (PROVENTIL HFA;VENTOLIN HFA) 108 (90 Base) MCG/ACT inhaler Inhale 1-2 puffs into the lungs every 4 (four) hours as needed for wheezing or shortness of breath.    Marland Kitchen atorvastatin (LIPITOR) 10 MG tablet Take 10 mg by mouth daily.    Marland Kitchen bismuth subsalicylate (PEPTO BISMOL) 262 MG chewable tablet Chew  524 mg by mouth as needed for indigestion or diarrhea or loose stools.     . cetirizine (ZYRTEC) 10 MG tablet Take 10 mg by mouth daily.    . cyclobenzaprine (FLEXERIL) 5 MG tablet Take 1 tablet (5 mg total) by mouth 3 (three) times daily as needed for muscle spasms. 10 tablet 0  . docusate sodium (COLACE) 100 MG capsule Take 100 mg by mouth 2 (two) times daily.    . fluticasone (FLONASE) 50 MCG/ACT nasal spray Place 1 spray into both nostrils daily as needed for allergies or rhinitis.    Marland Kitchen gabapentin (NEURONTIN) 100 MG capsule Take 100 mg by mouth 3 (three) times daily.     . IBU 600 MG tablet     . insulin aspart (NOVOLOG) 100 UNIT/ML injection Inject  15 Units into the skin 3 (three) times daily with meals.     . insulin glargine (LANTUS) 100 UNIT/ML injection Inject 65 Units into the skin at bedtime.     . meclizine (ANTIVERT) 25 MG tablet Take 25 mg by mouth 3 (three) times daily as needed for dizziness.    Marland Kitchen omeprazole (PRILOSEC) 40 MG capsule Take 40 mg by mouth daily as needed (GERD symptoms).     Marland Kitchen ELIQUIS 2.5 MG TABS tablet TAKE (1) TABLET BY MOUTH TWICE DAILY. 60 tablet 5   No current facility-administered medications on file prior to visit.    Allergies  Allergen Reactions  . Shellfish Allergy Hives    Swelling of throat and mouth. Betadine is NOT a problem  . Codeine Other (See Comments)    Reaction:  Hallucinations   . Sulfa Antibiotics Hives       Observations/Objective: There were no vitals filed for this visit. There is no height or weight on file to calculate BMI.  Physical Exam Constitutional:      General: He is not in acute distress. Neurological:     Mental Status: He is alert.     CBC    Component Value Date/Time   WBC 8.1 01/27/2020 1310   RBC 5.10 01/27/2020 1310   HGB 15.8 01/27/2020 1310   HGB 10.7 (L) 11/20/2014 1232   HCT 45.0 01/27/2020 1310   HCT 32.3 (L) 11/20/2014 1232   PLT 216 01/27/2020 1310   PLT 248 11/20/2014 1232   MCV 88.2  01/27/2020 1310   MCV 92 11/20/2014 1232   MCH 31.0 01/27/2020 1310   MCHC 35.1 01/27/2020 1310   RDW 12.4 01/27/2020 1310   RDW 15.9 (H) 11/20/2014 1232   LYMPHSABS 2.1 01/27/2020 1310   LYMPHSABS 1.3 11/13/2014 1858   MONOABS 0.7 01/27/2020 1310   MONOABS 0.4 11/13/2014 1858   EOSABS 0.1 01/27/2020 1310   EOSABS 0.8 (H) 11/13/2014 1858   BASOSABS 0.0 01/27/2020 1310   BASOSABS 0.1 11/13/2014 1858    CMP     Component Value Date/Time   NA 138 01/27/2020 1310   NA 134 (L) 11/20/2014 1232   K 4.3 01/27/2020 1310   K 3.9 11/20/2014 1232   CL 105 01/27/2020 1310   CL 100 (L) 11/20/2014 1232   CO2 25 01/27/2020 1310   CO2 28 11/20/2014 1232   GLUCOSE 179 (H) 01/27/2020 1310   GLUCOSE 184 (H) 11/20/2014 1232   BUN 18 01/27/2020 1310   BUN 9 11/20/2014 1232   CREATININE 1.04 01/27/2020 1310   CREATININE 1.08 11/20/2014 1232   CALCIUM 9.1 01/27/2020 1310   CALCIUM 8.6 (L) 11/20/2014 1232   PROT 6.9 01/27/2020 1310   PROT 5.9 (L) 11/01/2014 1413   ALBUMIN 3.6 01/27/2020 1310   ALBUMIN 2.2 (L) 11/01/2014 1413   AST 16 01/27/2020 1310   AST 20 11/01/2014 1413   ALT 21 01/27/2020 1310   ALT 27 11/01/2014 1413   ALKPHOS 62 01/27/2020 1310   ALKPHOS 97 11/01/2014 1413   BILITOT 0.8 01/27/2020 1310   BILITOT 2.1 (H) 11/01/2014 1413   GFRNONAA >60 01/27/2020 1310   GFRNONAA >60 11/20/2014 1232   GFRAA >60 01/27/2020 1310   GFRAA >60 11/20/2014 1232    RADIOGRAPHIC STUDIES: I have personally reviewed the radiological images as listed and agreed with the findings in the report. No results found.   Assessment and Plan: 1. History of pulmonary embolism   2. History of colon cancer    #  History of  pulmonary embolism recommend long-term anticoagulation prophylaxis.  recommend patient to continue Eliquis 2.5 mg twice daily for prophylaxis. He is clinically doing well.  Patient will be discharged from my clinic to follow-up with primary care provider. I reviewed 6 months of  Eliquis supply for him.  After that he needs to contact primary care provider for additional refills.  Remote history of colon cancer.  Recent CT scan negative for any signs of recurrence or metastatic disease.  Patient has been referred to genetic counselor.    Follow Up Instructions: Patient will be discharged to follow-up with primary care provider.   I discussed the assessment and treatment plan with the patient. The patient was provided an opportunity to ask questions and all were answered. The patient agreed with the plan and demonstrated an understanding of the instructions.  The patient was advised to call back or seek an in-person evaluation if the symptoms worsen or if the condition fails to improve as anticipated.    Earlie Server, MD 02/24/2020 9:05 PM

## 2020-03-08 ENCOUNTER — Ambulatory Visit: Payer: Self-pay | Admitting: Urology

## 2020-03-09 ENCOUNTER — Encounter: Payer: Self-pay | Admitting: Urology

## 2020-09-30 ENCOUNTER — Other Ambulatory Visit: Payer: Self-pay | Admitting: Oncology

## 2020-12-01 ENCOUNTER — Emergency Department
Admission: EM | Admit: 2020-12-01 | Discharge: 2020-12-01 | Disposition: A | Discharge status: Home / Self Care | Payer: Medicare HMO | Attending: Student in an Organized Health Care Education/Training Program | Admitting: Student in an Organized Health Care Education/Training Program

## 2020-12-01 ENCOUNTER — Other Ambulatory Visit: Payer: Self-pay

## 2020-12-01 ENCOUNTER — Encounter: Payer: Self-pay | Admitting: Radiology

## 2020-12-01 ENCOUNTER — Emergency Department: Payer: Medicare HMO

## 2020-12-01 DIAGNOSIS — I2510 Atherosclerotic heart disease of native coronary artery without angina pectoris: Secondary | ICD-10-CM | POA: Diagnosis not present

## 2020-12-01 DIAGNOSIS — R229 Localized swelling, mass and lump, unspecified: Secondary | ICD-10-CM | POA: Insufficient documentation

## 2020-12-01 DIAGNOSIS — Z85038 Personal history of other malignant neoplasm of large intestine: Secondary | ICD-10-CM | POA: Insufficient documentation

## 2020-12-01 DIAGNOSIS — K118 Other diseases of salivary glands: Secondary | ICD-10-CM

## 2020-12-01 DIAGNOSIS — J45909 Unspecified asthma, uncomplicated: Secondary | ICD-10-CM | POA: Insufficient documentation

## 2020-12-01 DIAGNOSIS — G510 Bell's palsy: Secondary | ICD-10-CM | POA: Diagnosis not present

## 2020-12-01 DIAGNOSIS — E119 Type 2 diabetes mellitus without complications: Secondary | ICD-10-CM | POA: Diagnosis not present

## 2020-12-01 DIAGNOSIS — Z794 Long term (current) use of insulin: Secondary | ICD-10-CM | POA: Insufficient documentation

## 2020-12-01 DIAGNOSIS — Z7901 Long term (current) use of anticoagulants: Secondary | ICD-10-CM | POA: Diagnosis not present

## 2020-12-01 DIAGNOSIS — R29810 Facial weakness: Secondary | ICD-10-CM | POA: Diagnosis present

## 2020-12-01 DIAGNOSIS — K1120 Sialoadenitis, unspecified: Secondary | ICD-10-CM

## 2020-12-01 LAB — CBC WITH DIFFERENTIAL/PLATELET
Abs Immature Granulocytes: 0.02 10*3/uL (ref 0.00–0.07)
Basophils Absolute: 0.1 10*3/uL (ref 0.0–0.1)
Basophils Relative: 1 %
Eosinophils Absolute: 0.1 10*3/uL (ref 0.0–0.5)
Eosinophils Relative: 1 %
HCT: 44 % (ref 39.0–52.0)
Hemoglobin: 14.9 g/dL (ref 13.0–17.0)
Immature Granulocytes: 0 %
Lymphocytes Relative: 28 %
Lymphs Abs: 2 10*3/uL (ref 0.7–4.0)
MCH: 30.3 pg (ref 26.0–34.0)
MCHC: 33.9 g/dL (ref 30.0–36.0)
MCV: 89.6 fL (ref 80.0–100.0)
Monocytes Absolute: 0.6 10*3/uL (ref 0.1–1.0)
Monocytes Relative: 9 %
Neutro Abs: 4.4 10*3/uL (ref 1.7–7.7)
Neutrophils Relative %: 61 %
Platelets: 220 10*3/uL (ref 150–400)
RBC: 4.91 MIL/uL (ref 4.22–5.81)
RDW: 12.7 % (ref 11.5–15.5)
WBC: 7.2 10*3/uL (ref 4.0–10.5)
nRBC: 0 % (ref 0.0–0.2)

## 2020-12-01 LAB — COMPREHENSIVE METABOLIC PANEL
ALT: 24 U/L (ref 0–44)
AST: 20 U/L (ref 15–41)
Albumin: 4.2 g/dL (ref 3.5–5.0)
Alkaline Phosphatase: 61 U/L (ref 38–126)
Anion gap: 8 (ref 5–15)
BUN: 16 mg/dL (ref 8–23)
CO2: 24 mmol/L (ref 22–32)
Calcium: 9.1 mg/dL (ref 8.9–10.3)
Chloride: 102 mmol/L (ref 98–111)
Creatinine, Ser: 1.12 mg/dL (ref 0.61–1.24)
GFR, Estimated: >60 mL/min (ref >60)
Glucose, Bld: 274 mg/dL — ABNORMAL HIGH (ref 70–99)
Potassium: 3.8 mmol/L (ref 3.5–5.1)
Sodium: 134 mmol/L — ABNORMAL LOW (ref 135–145)
Total Bilirubin: 0.8 mg/dL (ref 0.3–1.2)
Total Protein: 7.4 g/dL (ref 6.5–8.1)

## 2020-12-01 MED ORDER — AMOXICILLIN-POT CLAVULANATE 875-125 MG PO TABS: 1.0000 | ORAL_TABLET | Freq: Two times a day (BID) | ORAL | 0 refills | Status: AC

## 2020-12-01 MED ORDER — FENTANYL CITRATE (PF) 100 MCG/2ML IJ SOLN
50.0000 ug | Freq: Once | INTRAMUSCULAR | Status: AC
  Administered 2020-12-01: 50 ug via INTRAVENOUS
  Filled 2020-12-01: qty 2

## 2020-12-01 MED ORDER — IOHEXOL 300 MG/ML  SOLN
75.0000 mL | Freq: Once | INTRAMUSCULAR | Status: AC | PRN
  Administered 2020-12-01: 75 mL via INTRAVENOUS

## 2020-12-01 MED ORDER — AMOXICILLIN-POT CLAVULANATE 875-125 MG PO TABS
1.0000 | ORAL_TABLET | Freq: Once | ORAL | Status: AC
  Administered 2020-12-01: 1 via ORAL
  Filled 2020-12-01: qty 1

## 2020-12-01 MED ORDER — PREDNISONE 20 MG PO TABS: 40.0000 mg | ORAL_TABLET | Freq: Every day | ORAL | 0 refills | Status: AC

## 2020-12-01 NOTE — ED Notes (Signed)
See triage note. Pt has L sided pain in cheek, tongue, cheek. Cannot close L eye. Says also has L facial numbness that started around midnight last night.   Has hx seizures, DM, strokes, DVT, and parotid swelling.   Takes Eliquis. Pt alert and oriented and on RA.

## 2020-12-01 NOTE — ED Triage Notes (Signed)
Pt to ER via POV with complaints of L sided face swelling.   States he has had issues with parotid nodule swelling before and this feels the same. Pt with obvious swelling to L cheek, pt unable to close L eye. Reports L eye is watering, unable to blink easily. Pt reports pain to entire left side of face.

## 2020-12-01 NOTE — ED Notes (Signed)
Green top drawn and sent to lab for CMP.

## 2020-12-01 NOTE — ED Provider Notes (Signed)
[facility] Emergency Department Provider Note  Time seen: 6:41 PM  I have reviewed the triage vital signs and the nursing notes.   HISTORY  Chief Complaint Oral Swelling   HPI Michael Gill is a 60 y.o. male with a past medical history of CAD, diabetes, prior CVA, gastric reflux, hyperlipidemia, presents to the emergency department for  left facial pain and left facial droop.  According to the patient since yesterday he has been experiencing pain to the left cheek.  Patient states a history of a parotid nodule that will occasionally become inflamed and painful.  Patient states today he has noticed the left side of his face is drooping and he is not able to close his left eye and he has noticed tearing from the left eye as well.  Denies any known fever.  No vomiting.  Largely negative review of systems otherwise.  Denies any weakness or numbness of any arm or leg confusion slurred speech.  Past Medical History:  Diagnosis Date  . Asthma   . B12 deficiency    in the past  . CAD (coronary artery disease)    s/p MI  . Colon cancer ([place]) 1990  . Complication of anesthesia    long time to wake up from colonoscopy.  . Conversion disorder    difficulty with visual disturbances and changes with unknown etiology, along with headaches  . CVA (cerebral infarction)   . 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 DVT (deep venous thrombosis) ([place]) 2016   behind right knee  . Dyspnea 09/2018   with exercise  . Dysrhythmia    has had Afib in past.  . GERD (gastroesophageal reflux disease)   . History of kidney stones 1990  . Hx of therapeutic radiation   . Hyperlipidemia   . Myocardial infarction ([place]) 1990  . Neuropathic pain, leg   . 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  . Seizure ([place])    last episode 08/2017. has no warning, will just happen. nothing specific precipitates event   . Sleep apnea    does not use cpap  . 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 Stroke [facility]) 2012, 03/2018   residual tremor left hand, facial droop  . Tremors of nervous system    specifically left hand, since stroke    Patient Active Problem List   Diagnosis Date Noted  . History of pulmonary embolism 08/05/2019  . History of colon cancer 08/05/2019  . Chronic anticoagulation 08/05/2019  . Pulmonary embolism ([place]) 02/25/2019  . Sepsis ([place]) 01/30/2019  . Deep vein thrombosis (DVT) of popliteal vein of right lower extremity ([place]) 10/30/2014  . TIA (transient ischemic attack) 01/19/2014    Past Surgical History:  Procedure Laterality Date  . APPENDECTOMY  1971  . CARDIAC CATHETERIZATION Left 10/12/2015   Procedure: Left Heart Cath and Coronary Angiography;  Surgeon: Yolonda Kida, MD;  Location: [place] [facility];  Service: Cardiovascular;  Laterality: Left;  . TONSILLECTOMY      Prior to Admission medications   Medication Sig Start Date End Date Taking? Authorizing Provider  acetaminophen (TYLENOL) 500 MG tablet Take 500 mg by mouth every 6 (six) hours as needed for moderate pain.    [provider]  albuterol (PROVENTIL HFA;VENTOLIN HFA) 108 (90 Base) MCG/ACT inhaler Inhale 1-2 puffs into  the lungs every 4 (four) hours as needed for wheezing or shortness of breath.    [provider]  atorvastatin (LIPITOR) 10 MG tablet Take 10 mg by mouth daily.    [provider]  bismuth subsalicylate (PEPTO BISMOL) 262 MG chewable tablet Chew 524 mg by mouth as needed for indigestion or diarrhea or loose stools.     [provider]  cetirizine (ZYRTEC) 10 MG tablet Take 10 mg by mouth daily.    [provider]  cyclobenzaprine (FLEXERIL) 5 MG tablet Take 1 tablet (5 mg total) by mouth 3 (three) times daily as needed for muscle spasms. 05/07/19   Merlyn Lot, MD  docusate sodium (COLACE) 100  MG capsule Take 100 mg by mouth 2 (two) times daily.    [provider]  ELIQUIS 2.5 MG TABS tablet TAKE (1) TABLET BY MOUTH TWICE DAILY. 02/24/20   Earlie Server, MD  fluticasone (FLONASE) 50 MCG/ACT nasal spray Place 1 spray into both nostrils daily as needed for allergies or rhinitis.    [provider]  gabapentin (NEURONTIN) 100 MG capsule Take 100 mg by mouth 3 (three) times daily.     [provider]  IBU 600 MG tablet  03/07/19   [provider]  insulin aspart (NOVOLOG) 100 UNIT/ML injection Inject 15 Units into the skin 3 (three) times daily with meals.     [provider]  insulin glargine (LANTUS) 100 UNIT/ML injection Inject 65 Units into the skin at bedtime.     [provider]  meclizine (ANTIVERT) 25 MG tablet Take 25 mg by mouth 3 (three) times daily as needed for dizziness.    [provider]  omeprazole (PRILOSEC) 40 MG capsule Take 40 mg by mouth daily as needed (GERD symptoms).     [provider]    Allergies  Allergen Reactions  . Shellfish Allergy Hives    Swelling of throat and mouth. Betadine is NOT a problem  . Codeine Other (See Comments)    Reaction:  Hallucinations   . Sulfa Antibiotics Hives    Family History  Problem Relation Age of Onset  . Alzheimer's disease Mother   . Congestive Heart Failure Father   . Colon cancer Brother     Social History Social History   Tobacco Use  . Smoking status: Never Smoker  . Smokeless tobacco: Never Used  Vaping Use  . Vaping Use: Never used  Substance Use Topics  . Alcohol use: No  . Drug use: No    Review of Systems Constitutional: Negative for fever.  Left facial tenderness/pain Cardiovascular: Negative for chest pain. Respiratory: Negative for shortness of breath. Gastrointestinal: Negative for abdominal pain, vomiting  Musculoskeletal: Negative for musculoskeletal complaints Neurological: Negative for headache.  Left facial droop. All  other ROS negative  ____________________________________________   PHYSICAL EXAM:  VITAL SIGNS: ED Triage Vitals  Enc Vitals Group     BP 12/01/20 1618 (!) 169/98     Pulse Rate 12/01/20 1618 (!) 103     Resp 12/01/20 1618 18     Temp 12/01/20 1618 99.6 F (37.6 C)     Temp Source 12/01/20 1618 Oral     SpO2 12/01/20 1618 96 %     Weight 12/01/20 1619 240 lb (108.9 kg)     Height 12/01/20 1619 5\' 8"  (1.727 m)     Head Circumference --      Peak Flow --      Pain Score 12/01/20 1618  10     Pain Loc --      Pain Edu? --      Excl. in [place]? --     Constitutional: Alert and oriented. Well appearing and in no distress. Eyes: Normal exam ENT      Head: Normocephalic and atraumatic.      Mouth/Throat: Mild left facial swelling with moderate tenderness to palpation. Cardiovascular: Normal rate, regular rhythm.  Respiratory: Normal respiratory effort without tachypnea nor retractions. Breath sounds are clear Gastrointestinal: Soft and nontender. No distention.   Musculoskeletal: Nontender with normal range of motion in all extremities.  Neurologic:  Normal speech and language.  Significant left facial droop affecting the eyelid and eyebrow.  Sensation is normal and intact.  Equal grip strength bilaterally.  No pronator drift.  5/5 motor no extremities. Skin:  Skin is warm, dry and intact.  Psychiatric: Mood and affect are normal.   ____________________________________________   INITIAL IMPRESSION / ASSESSMENT AND PLAN / ED COURSE  Pertinent labs & imaging results that were available during my care of the patient were reviewed by me and considered in my medical decision making (see chart for details).   Patient presents emergency department for left facial tenderness and swelling.  States a history of a parotid nodule in the past.  Today patient has noticed his left face was drooping.  On examination patient has a fairly significant left facial droop which also affects his eyebrow  and lid on the left as well.  Patient has moderate tenderness to the left face over the parotid gland.  Highly suspect parotid infection leading to a Bell's palsy type of facial lesion.  We will proceed with CT imaging of the face with contrast to further evaluate.  Lab work is largely [place].  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  Unchanged from 2019.  I spoke to Dr. Richardson Landry of ENT who recommends placing the patient on antibiotics and prednisone given the facial nerve dysfunction.  States patient needs to follow-up with ENT.  I spoke to the patient he has not seen ENT since his visit with Dr. Pryor Ochoa 4 years ago.  Patient states he does not want to follow-up with Dr. Pryor Ochoa any longer.  I will refer to Dr. Richardson Landry.  DONAVAN KERLIN was evaluated in Emergency Department on 12/01/2020 for the symptoms described in the history of present illness. He was evaluated in the context of the global COVID-19 pandemic, which necessitated consideration that the patient might be at risk for infection with the SARS-CoV-2 virus that causes COVID-19. Institutional protocols and algorithms that pertain to the evaluation of patients at risk for COVID-19 are in a state of rapid change based on information released by regulatory bodies including the CDC and federal and state organizations. These policies and algorithms were followed during the patient's care in the ED.  ____________________________________________   FINAL CLINICAL IMPRESSION(S) / ED DIAGNOSES  Facial nerve paralysis Parotid mass    Harvest Dark, MD 12/01/20 2132

## 2020-12-01 NOTE — ED Notes (Signed)
Patient AOX4 and able to voice needs appropriately.  No acute distress.  Discharge instructions reviewed with patient, verbalized understanding, no additional needs assessed at this time.

## 2020-12-01 NOTE — ED Notes (Signed)
Dc IV. 

## 2020-12-01 NOTE — ED Notes (Signed)
Pt unable to sign MSE-waiver due to signature pad not working. Pt verbalizes understanding of waiver.

## 2020-12-01 NOTE — Discharge Instructions (Addendum)
As we discussed is very important you follow-up with ENT.  Please take your steroids and antibiotics as prescribed.  As we discussed please use lubricating eyedrops during the day and physically tape your eye shut at night to prevent eye drying and irritation.

## 2020-12-01 NOTE — ED Notes (Signed)
Lab called. Need re draw on green top. Will redraw and send.

## 2020-12-16 ENCOUNTER — Other Ambulatory Visit: Payer: Self-pay | Admitting: Otolaryngology

## 2020-12-16 DIAGNOSIS — K118 Other diseases of salivary glands: Secondary | ICD-10-CM

## 2020-12-20 ENCOUNTER — Other Ambulatory Visit: Payer: Self-pay | Admitting: Radiology

## 2020-12-22 ENCOUNTER — Other Ambulatory Visit: Payer: Self-pay

## 2020-12-22 ENCOUNTER — Ambulatory Visit: Admission: RE | Admit: 2020-12-22 | Payer: Medicare HMO | Source: Ambulatory Visit

## 2020-12-22 ENCOUNTER — Ambulatory Visit
Admission: RE | Admit: 2020-12-22 | Discharge: 2020-12-22 | Disposition: A | Discharge status: Home / Self Care | Payer: Medicare HMO | Source: Ambulatory Visit | Attending: Otolaryngology | Admitting: Otolaryngology

## 2020-12-22 DIAGNOSIS — K118 Other diseases of salivary glands: Secondary | ICD-10-CM | POA: Diagnosis present

## 2020-12-22 NOTE — Procedures (Signed)
Interventional Radiology Procedure Note  Procedure: US guided biopsy of left intra-parotid mass  Indication: Left intra-parotid mass  Findings: Please refer to procedural dictation for full description.  Complications: None  EBL: < 10 mL  Miachel Roux, MD [phone]

## 2020-12-24 LAB — SURGICAL PATHOLOGY

## 2021-02-07 ENCOUNTER — Ambulatory Visit: Payer: Medicare HMO | Admitting: Urology

## 2021-02-11 ENCOUNTER — Encounter: Payer: Self-pay | Admitting: Urology

## 2022-03-02 ENCOUNTER — Other Ambulatory Visit: Payer: Self-pay | Admitting: Family Medicine

## 2022-03-02 DIAGNOSIS — R1902 Left upper quadrant abdominal swelling, mass and lump: Secondary | ICD-10-CM

## 2022-03-09 ENCOUNTER — Ambulatory Visit
Admission: RE | Admit: 2022-03-09 | Discharge: 2022-03-09 | Disposition: A | Discharge status: Home / Self Care | Payer: Medicare HMO | Source: Ambulatory Visit | Attending: Family Medicine | Admitting: Family Medicine

## 2022-03-09 DIAGNOSIS — R1902 Left upper quadrant abdominal swelling, mass and lump: Secondary | ICD-10-CM | POA: Insufficient documentation

## 2022-05-31 ENCOUNTER — Other Ambulatory Visit: Payer: Self-pay | Admitting: Family Medicine

## 2022-05-31 DIAGNOSIS — M7989 Other specified soft tissue disorders: Secondary | ICD-10-CM

## 2022-06-08 ENCOUNTER — Ambulatory Visit
Admission: RE | Admit: 2022-06-08 | Discharge: 2022-06-08 | Disposition: A | Discharge status: Home / Self Care | Payer: Medicare HMO | Source: Ambulatory Visit | Attending: Family Medicine | Admitting: Family Medicine

## 2022-06-08 DIAGNOSIS — M7989 Other specified soft tissue disorders: Secondary | ICD-10-CM | POA: Insufficient documentation

## 2022-09-19 ENCOUNTER — Emergency Department
Admission: EM | Admit: 2022-09-19 | Discharge: 2022-09-19 | Disposition: A | Discharge status: Home / Self Care | Payer: Medicare HMO | Attending: Emergency Medicine | Admitting: Emergency Medicine

## 2022-09-19 ENCOUNTER — Other Ambulatory Visit: Payer: Self-pay

## 2022-09-19 ENCOUNTER — Emergency Department: Payer: Medicare HMO

## 2022-09-19 ENCOUNTER — Encounter: Payer: Self-pay | Admitting: Emergency Medicine

## 2022-09-19 DIAGNOSIS — K118 Other diseases of salivary glands: Secondary | ICD-10-CM

## 2022-09-19 DIAGNOSIS — R0602 Shortness of breath: Secondary | ICD-10-CM | POA: Diagnosis not present

## 2022-09-19 DIAGNOSIS — Z8673 Personal history of transient ischemic attack (TIA), and cerebral infarction without residual deficits: Secondary | ICD-10-CM | POA: Insufficient documentation

## 2022-09-19 DIAGNOSIS — Z79899 Other long term (current) drug therapy: Secondary | ICD-10-CM | POA: Diagnosis not present

## 2022-09-19 DIAGNOSIS — R220 Localized swelling, mass and lump, head: Secondary | ICD-10-CM | POA: Diagnosis present

## 2022-09-19 LAB — DIFFERENTIAL
Abs Immature Granulocytes: 0.04 10*3/uL (ref 0.00–0.07)
Basophils Absolute: 0 10*3/uL (ref 0.0–0.1)
Basophils Relative: 0 %
Eosinophils Absolute: 0.1 10*3/uL (ref 0.0–0.5)
Eosinophils Relative: 1 %
Immature Granulocytes: 1 %
Lymphocytes Relative: 24 %
Lymphs Abs: 1.8 10*3/uL (ref 0.7–4.0)
Monocytes Absolute: 0.6 10*3/uL (ref 0.1–1.0)
Monocytes Relative: 7 %
Neutro Abs: 5.1 10*3/uL (ref 1.7–7.7)
Neutrophils Relative %: 67 %

## 2022-09-19 LAB — CBC
HCT: 46.8 % (ref 39.0–52.0)
Hemoglobin: 15.8 g/dL (ref 13.0–17.0)
MCH: 30.3 pg (ref 26.0–34.0)
MCHC: 33.8 g/dL (ref 30.0–36.0)
MCV: 89.7 fL (ref 80.0–100.0)
Platelets: 192 10*3/uL (ref 150–400)
RBC: 5.22 MIL/uL (ref 4.22–5.81)
RDW: 11.9 % (ref 11.5–15.5)
WBC: 7.6 10*3/uL (ref 4.0–10.5)
nRBC: 0 % (ref 0.0–0.2)

## 2022-09-19 LAB — COMPREHENSIVE METABOLIC PANEL
ALT: 17 U/L (ref 0–44)
AST: 18 U/L (ref 15–41)
Albumin: 4 g/dL (ref 3.5–5.0)
Alkaline Phosphatase: 64 U/L (ref 38–126)
Anion gap: 10 (ref 5–15)
BUN: 19 mg/dL (ref 8–23)
CO2: 22 mmol/L (ref 22–32)
Calcium: 9.2 mg/dL (ref 8.9–10.3)
Chloride: 102 mmol/L (ref 98–111)
Creatinine, Ser: 0.91 mg/dL (ref 0.61–1.24)
GFR, Estimated: >60 mL/min (ref >60)
Glucose, Bld: 308 mg/dL — ABNORMAL HIGH (ref 70–99)
Potassium: 4.1 mmol/L (ref 3.5–5.1)
Sodium: 134 mmol/L — ABNORMAL LOW (ref 135–145)
Total Bilirubin: 1.2 mg/dL (ref 0.3–1.2)
Total Protein: 7.1 g/dL (ref 6.5–8.1)

## 2022-09-19 LAB — ETHANOL: Alcohol, Ethyl (B): <10 mg/dL (ref <10)

## 2022-09-19 LAB — PROTIME-INR
INR: 1 (ref 0.8–1.2)
Prothrombin Time: 13.5 seconds (ref 11.4–15.2)

## 2022-09-19 LAB — APTT: aPTT: 31 seconds (ref 24–36)

## 2022-09-19 MED ORDER — ONDANSETRON HCL 4 MG/2ML IJ SOLN
4.0000 mg | Freq: Once | INTRAMUSCULAR | Status: AC
  Administered 2022-09-19: 4 mg via INTRAVENOUS
  Filled 2022-09-19: qty 2

## 2022-09-19 MED ORDER — MORPHINE SULFATE (PF) 4 MG/ML IV SOLN
4.0000 mg | Freq: Once | INTRAVENOUS | Status: AC
  Administered 2022-09-19: 4 mg via INTRAVENOUS
  Filled 2022-09-19: qty 1

## 2022-09-19 MED ORDER — CLINDAMYCIN HCL 300 MG PO CAPS: 300.0000 mg | ORAL_CAPSULE | Freq: Four times a day (QID) | ORAL | 0 refills | Status: AC

## 2022-09-19 MED ORDER — SODIUM CHLORIDE 0.9% FLUSH: 3.0000 mL | Freq: Once | INTRAVENOUS | Status: DC

## 2022-09-19 MED ORDER — CLINDAMYCIN HCL 150 MG PO CAPS
300.0000 mg | ORAL_CAPSULE | Freq: Once | ORAL | Status: AC
  Administered 2022-09-19: 300 mg via ORAL
  Filled 2022-09-19: qty 2

## 2022-09-19 MED ORDER — HYDROMORPHONE HCL 1 MG/ML IJ SOLN
1.0000 mg | Freq: Once | INTRAMUSCULAR | Status: AC
  Administered 2022-09-19: 1 mg via INTRAVENOUS
  Filled 2022-09-19: qty 1

## 2022-09-19 MED ORDER — OXYCODONE-ACETAMINOPHEN 5-325 MG PO TABS: 1.0000 | ORAL_TABLET | Freq: Four times a day (QID) | ORAL | 0 refills | Status: DC | PRN

## 2022-09-19 MED ORDER — IOHEXOL 300 MG/ML  SOLN
100.0000 mL | Freq: Once | INTRAMUSCULAR | Status: AC | PRN
  Administered 2022-09-19: 100 mL via INTRAVENOUS

## 2022-09-19 MED ORDER — KETOROLAC TROMETHAMINE 30 MG/ML IJ SOLN
30.0000 mg | Freq: Once | INTRAMUSCULAR | Status: AC
  Administered 2022-09-19: 30 mg via INTRAVENOUS
  Filled 2022-09-19: qty 1

## 2022-09-19 NOTE — ED Triage Notes (Signed)
Patient to ED for left side facial swelling and severe headache that started around 0530. Patient states difficulty swallowing due to the swelling on the face. Also, c/o os SOB due to the pain. States numbness on left side of tongue. Hx of stroke.

## 2022-09-19 NOTE — ED Provider Notes (Signed)
[facility] Provider Note  Patient Contact: 12:49 PM (approximate)   History   Facial Swelling   HPI  Michael Gill is a 62 y.o. male who presents emergency department complaining of significant headache/facial pain.  Patient has had sudden swelling of the left face/parotid region.  He is having pain in his temporal region, cheek, down to his jaw.  Patient with no chest pain.  He states that he has a history of previous stroke, as well as a history of Bell's palsy to the left face.  He has ongoing facial droop but has no new symptoms.  Patient has not tried any medications prior to arrival.     Physical Exam   Triage Vital Signs: ED Triage Vitals  Enc Vitals Group     BP 09/19/22 1108 (!) 174/94     Pulse Rate 09/19/22 1108 82     Resp 09/19/22 1108 18     Temp 09/19/22 1108 97.8 F (36.6 C)     Temp Source 09/19/22 1108 Oral     SpO2 09/19/22 1108 99 %     Weight 09/19/22 1215 240 lb 1.3 oz (108.9 kg)     Height 09/19/22 1215 5' 8"$  (1.727 m)     Head Circumference --      Peak Flow --      Pain Score 09/19/22 1108 10     Pain Loc --      Pain Edu? --      Excl. in [place]? --     Most recent vital signs: Vitals:   09/19/22 1512 09/19/22 1512  BP: (!) 170/88 (!) 160/78  Pulse: 78 70  Resp: 15 16  Temp: 98 F (36.7 C)   SpO2: 99% 94%     General: Alert and in no acute distress. Eyes:  PERRL. EOMI. Head: No acute traumatic findings.  Visualization of the left face reveals significant edema about the parotid gland region.  This extends into the submandibular region and slightly into the lateral and superior neck.  This is very tender to palpation.  No warmth to palpation.  No fluctuance concerning for abscess.  No other significant edema or erythema of the head or face. ENT:      Ears:       Nose: No congestion/rhinnorhea.      Mouth/Throat: Mucous membranes are moist. Neck: No stridor. No cervical spine tenderness to palpation.  Patient has  edema extending from his parotid region into the lateral neck.  There is no anterior neck edema or erythema.  Cardiovascular:  Good peripheral perfusion Respiratory: Normal respiratory effort without tachypnea or retractions. Lungs CTAB. Good air entry to the bases with no decreased or absent breath sounds Musculoskeletal: Full range of motion to all extremities.  Cranial nerves grossly intact at this time.  Patient does have some residual left facial droop secondary to Bell's palsy.  Reportedly no change from patient's baseline.  Otherwise cranial nerves are grossly intact. Neurologic:  No gross focal neurologic deficits are appreciated.  Skin:   No rash noted Other:   ED Results / Procedures / Treatments   Labs (all labs ordered are listed, but only abnormal results are displayed) Labs Reviewed  COMPREHENSIVE METABOLIC PANEL - Abnormal; Notable for the following components:      Result Value   Sodium 134 (*)    Glucose, Bld 308 (*)    All other components within normal limits  PROTIME-INR  APTT  CBC  DIFFERENTIAL  ETHANOL  I-STAT CREATININE, ED  CBG MONITORING, ED     EKG     RADIOLOGY  I personally viewed, evaluated, and interpreted these images as part of my medical decision making, as well as reviewing the written report by the radiologist.  ED Provider Interpretation: Prior mass seen on CT scan with contrast.  CT head negative was reassuring with no acute intracranial abnormality.  On CT scan of the neck, there is a largely unchanged in size parotid mass that there is some changes within the mass and consistency of tissue.  No fluid collection identified at this time.  CT Soft Tissue Neck W Contrast  Result Date: 09/19/2022 CLINICAL DATA:  Soft tissue infection suspected. Left-sided facial swelling and severe headache that started around 530. EXAM: CT NECK WITH CONTRAST TECHNIQUE: Multidetector CT imaging of the neck was performed using the standard protocol following  the bolus administration of intravenous contrast. RADIATION DOSE REDUCTION: This exam was performed according to the departmental dose-optimization program which includes automated exposure control, adjustment of the mA and/or kV according to patient size and/or use of iterative reconstruction technique. CONTRAST:  138m OMNIPAQUE IOHEXOL 300 MG/ML  SOLN COMPARISON:  CT Face 12/01/20 FINDINGS: Pharynx and larynx: Normal. No mass or swelling. Salivary glands: There is a large heterogeneous soft tissue density mass measuring up to 3.1 x 2.2 x 3.8 cm in the left parotid gland. This was likely previously biopsied on 12/22/20 with benign result. Compared to prior exam, this lesion now appears more centrally hypodense. There are additional enlarged soft tissue density lesions more peripherally in the left parotid gland which are grossly unchanged compared to prior exam. Bilateral submandibular glands are normal in appearance. Thyroid: Normal. Lymph nodes: There are few prominent lymph nodes in the left level 2A lymph node station which are not individually enlarged by size criteria. No evidence of extra parotid lymphadenopathy. Vascular: Negative. Limited intracranial: Negative. Visualized orbits: Not visualized Mastoids and visualized paranasal sinuses: Clear. Skeleton: No acute or aggressive process. Upper chest: Negative. Other: Large dental carie and periapical lucency along a right mandibular premolar. IMPRESSION: 1. Large heterogeneous soft tissue density mass in the left parotid gland measuring up to 3.8 cm, which was likely previously biopsied on 12/22/20 with benign result. Compared to prior exam, this lesion now appears more centrally hypodense. This finding is nonspecific. There is no evidence of surrounding soft tissue stranding to definitively suggest infection, but given central hypodensity, infection is a differential consideration. A parotid neoplasm remains a differential consideration and rebiopsy should be  considered. 2. Prominent lymph nodes in the left level 2A lymph node station which are not individually enlarged by size criteria. No evidence of extra parotid lymphadenopathy. 3. Large dental carie and periapical lucency along a right mandibular premolar. Electronically Signed   By: HMarin RobertsM.D.   On: 09/19/2022 13:30   CT HEAD WO CONTRAST  Result Date: 09/19/2022 CLINICAL DATA:  Severe headache and left-sided facial swelling. Left tongue numbness. EXAM: CT HEAD WITHOUT CONTRAST TECHNIQUE: Contiguous axial images were obtained from the base of the skull through the vertex without intravenous contrast. RADIATION DOSE REDUCTION: This exam was performed according to the departmental dose-optimization program which includes automated exposure control, adjustment of the mA and/or kV according to patient size and/or use of iterative reconstruction technique. COMPARISON:  CT maxillofacial dated Dec 01, 2020. CT head dated May 07, 2019. FINDINGS: Brain: No evidence of acute infarction, hemorrhage, hydrocephalus, extra-axial collection or mass lesion/mass effect. Irregular hypodensity in  the left pons has been seen on prior CT scans and is most consistent with streak artifact given normal appearance of this area on prior MRIs. Vascular: No hyperdense vessel or unexpected calcification. Skull: Normal. Negative for fracture or focal lesion. Sinuses/Orbits: No acute finding. Other: None. IMPRESSION: 1. No acute intracranial abnormality. Electronically Signed   By: Titus Dubin M.D.   On: 09/19/2022 11:35    PROCEDURES:  Critical Care performed: No  Procedures   MEDICATIONS ORDERED IN ED: Medications  sodium chloride flush (NS) 0.9 % injection 3 mL (3 mLs Intravenous Not Given 09/19/22 1214)  ondansetron (ZOFRAN) injection 4 mg (4 mg Intravenous Given 09/19/22 1252)  morphine (PF) 4 MG/ML injection 4 mg (4 mg Intravenous Given 09/19/22 1253)  iohexol (OMNIPAQUE) 300 MG/ML solution 100 mL (100 mLs  Intravenous Contrast Given 09/19/22 1303)  HYDROmorphone (DILAUDID) injection 1 mg (1 mg Intravenous Given 09/19/22 1453)  ketorolac (TORADOL) 30 MG/ML injection 30 mg (30 mg Intravenous Given 09/19/22 1507)  clindamycin (CLEOCIN) capsule 300 mg (300 mg Oral Given 09/19/22 1507)     IMPRESSION / MDM / ASSESSMENT AND PLAN / ED COURSE  I reviewed the triage vital signs and the nursing notes.                                 Differential diagnosis includes, but is not limited to, parotitis, cellulitis, sialoadenitis, facial abscess   Patient's presentation is most consistent with acute presentation with potential threat to life or bodily function.   Patient's diagnosis is consistent with parotid mass, facial swelling.  Patient presents emergency department with worsening swelling and pain to a known left-sided parotid mass.  Patient has no fevers, chills.  Labs are overall reassuring.  Patient had an associated headache which I thought was secondary to pressure of the trigeminal nerve, however CT scan of the head was obtained to ensure no evidence of CVA as he does have a history of same.  CT scan is reassuring.  Based off of CT scan of the neck, I suspect that there may be an infectious component worsening the patient's edema and pain to the region.  Will cover with antibiotics.  However tissue has change in density and it is recommended the patient have a repeat biopsy to ensure no neoplastic changes.  Patient will follow-up with ENT.  Return cautions discussed with the patient..  Patient is given ED precautions to return to the ED for any worsening or new symptoms.     FINAL CLINICAL IMPRESSION(S) / ED DIAGNOSES   Final diagnoses:  Mass of left parotid gland  Facial swelling     Rx / DC Orders   ED Discharge Orders          Ordered    clindamycin (CLEOCIN) 300 MG capsule  4 times daily        09/19/22 1456    oxyCODONE-acetaminophen (PERCOCET/ROXICET) 5-325 MG tablet  Every 6 hours  PRN        09/19/22 1456             Note:  This document was prepared using Dragon voice recognition software and may include unintentional dictation errors.   Darletta Moll, PA-C 09/19/22 1536    Naaman Plummer, MD 09/19/22 1536

## 2023-05-31 ENCOUNTER — Emergency Department: Payer: Medicare HMO

## 2023-05-31 ENCOUNTER — Other Ambulatory Visit: Payer: Self-pay

## 2023-05-31 ENCOUNTER — Inpatient Hospital Stay
Admission: EM | Admit: 2023-05-31 | Discharge: 2023-06-06 | DRG: 872 | Disposition: A | Discharge status: Home Health | Payer: Medicare HMO | Attending: Family Medicine | Admitting: Family Medicine

## 2023-05-31 ENCOUNTER — Encounter: Payer: Self-pay | Admitting: Emergency Medicine

## 2023-05-31 DIAGNOSIS — Z8249 Family history of ischemic heart disease and other diseases of the circulatory system: Secondary | ICD-10-CM | POA: Diagnosis not present

## 2023-05-31 DIAGNOSIS — N412 Abscess of prostate: Secondary | ICD-10-CM | POA: Diagnosis present

## 2023-05-31 DIAGNOSIS — Z85038 Personal history of other malignant neoplasm of large intestine: Secondary | ICD-10-CM

## 2023-05-31 DIAGNOSIS — Z86711 Personal history of pulmonary embolism: Secondary | ICD-10-CM

## 2023-05-31 DIAGNOSIS — Z882 Allergy status to sulfonamides status: Secondary | ICD-10-CM | POA: Diagnosis not present

## 2023-05-31 DIAGNOSIS — Z794 Long term (current) use of insulin: Secondary | ICD-10-CM

## 2023-05-31 DIAGNOSIS — R519 Headache, unspecified: Secondary | ICD-10-CM | POA: Diagnosis present

## 2023-05-31 DIAGNOSIS — N10 Acute tubulo-interstitial nephritis: Principal | ICD-10-CM | POA: Insufficient documentation

## 2023-05-31 DIAGNOSIS — Z91013 Allergy to seafood: Secondary | ICD-10-CM

## 2023-05-31 DIAGNOSIS — A419 Sepsis, unspecified organism: Principal | ICD-10-CM | POA: Diagnosis present

## 2023-05-31 DIAGNOSIS — E785 Hyperlipidemia, unspecified: Secondary | ICD-10-CM | POA: Diagnosis present

## 2023-05-31 DIAGNOSIS — E669 Obesity, unspecified: Secondary | ICD-10-CM | POA: Diagnosis present

## 2023-05-31 DIAGNOSIS — Z8673 Personal history of transient ischemic attack (TIA), and cerebral infarction without residual deficits: Secondary | ICD-10-CM

## 2023-05-31 DIAGNOSIS — Z800 Family history of malignant neoplasm of digestive organs: Secondary | ICD-10-CM

## 2023-05-31 DIAGNOSIS — I10 Essential (primary) hypertension: Secondary | ICD-10-CM | POA: Diagnosis present

## 2023-05-31 DIAGNOSIS — Z820 Family history of epilepsy and other diseases of the nervous system: Secondary | ICD-10-CM | POA: Diagnosis not present

## 2023-05-31 DIAGNOSIS — Z79899 Other long term (current) drug therapy: Secondary | ICD-10-CM

## 2023-05-31 DIAGNOSIS — N12 Tubulo-interstitial nephritis, not specified as acute or chronic: Principal | ICD-10-CM | POA: Diagnosis present

## 2023-05-31 DIAGNOSIS — E871 Hypo-osmolality and hyponatremia: Secondary | ICD-10-CM | POA: Diagnosis present

## 2023-05-31 DIAGNOSIS — K5900 Constipation, unspecified: Secondary | ICD-10-CM | POA: Diagnosis present

## 2023-05-31 DIAGNOSIS — B9561 Methicillin susceptible Staphylococcus aureus infection as the cause of diseases classified elsewhere: Secondary | ICD-10-CM | POA: Diagnosis not present

## 2023-05-31 DIAGNOSIS — N400 Enlarged prostate without lower urinary tract symptoms: Secondary | ICD-10-CM | POA: Diagnosis present

## 2023-05-31 DIAGNOSIS — Z86718 Personal history of other venous thrombosis and embolism: Secondary | ICD-10-CM | POA: Diagnosis not present

## 2023-05-31 DIAGNOSIS — R29818 Other symptoms and signs involving the nervous system: Secondary | ICD-10-CM | POA: Diagnosis not present

## 2023-05-31 DIAGNOSIS — K219 Gastro-esophageal reflux disease without esophagitis: Secondary | ICD-10-CM | POA: Diagnosis present

## 2023-05-31 DIAGNOSIS — I2510 Atherosclerotic heart disease of native coronary artery without angina pectoris: Secondary | ICD-10-CM | POA: Diagnosis present

## 2023-05-31 DIAGNOSIS — E1142 Type 2 diabetes mellitus with diabetic polyneuropathy: Secondary | ICD-10-CM | POA: Diagnosis present

## 2023-05-31 DIAGNOSIS — E1165 Type 2 diabetes mellitus with hyperglycemia: Secondary | ICD-10-CM | POA: Diagnosis present

## 2023-05-31 DIAGNOSIS — Z91199 Patient's noncompliance with other medical treatment and regimen due to unspecified reason: Secondary | ICD-10-CM

## 2023-05-31 DIAGNOSIS — Z7901 Long term (current) use of anticoagulants: Secondary | ICD-10-CM | POA: Diagnosis not present

## 2023-05-31 DIAGNOSIS — R319 Hematuria, unspecified: Secondary | ICD-10-CM | POA: Diagnosis present

## 2023-05-31 DIAGNOSIS — Z6832 Body mass index (BMI) 32.0-32.9, adult: Secondary | ICD-10-CM | POA: Diagnosis not present

## 2023-05-31 DIAGNOSIS — N401 Enlarged prostate with lower urinary tract symptoms: Secondary | ICD-10-CM | POA: Diagnosis not present

## 2023-05-31 DIAGNOSIS — I252 Old myocardial infarction: Secondary | ICD-10-CM | POA: Diagnosis not present

## 2023-05-31 DIAGNOSIS — F446 Conversion disorder with sensory symptom or deficit: Secondary | ICD-10-CM | POA: Diagnosis not present

## 2023-05-31 DIAGNOSIS — N2889 Other specified disorders of kidney and ureter: Secondary | ICD-10-CM | POA: Diagnosis not present

## 2023-05-31 DIAGNOSIS — G4733 Obstructive sleep apnea (adult) (pediatric): Secondary | ICD-10-CM | POA: Diagnosis present

## 2023-05-31 LAB — CBC WITH DIFFERENTIAL/PLATELET
Abs Immature Granulocytes: 0.62 10*3/uL — ABNORMAL HIGH (ref 0.00–0.07)
Basophils Absolute: 0.1 10*3/uL (ref 0.0–0.1)
Basophils Relative: 1 %
Eosinophils Absolute: 0.1 10*3/uL (ref 0.0–0.5)
Eosinophils Relative: 0 %
HCT: 38.1 % — ABNORMAL LOW (ref 39.0–52.0)
Hemoglobin: 13.4 g/dL (ref 13.0–17.0)
Immature Granulocytes: 4 %
Lymphocytes Relative: 9 %
Lymphs Abs: 1.5 10*3/uL (ref 0.7–4.0)
MCH: 31.1 pg (ref 26.0–34.0)
MCHC: 35.2 g/dL (ref 30.0–36.0)
MCV: 88.4 fL (ref 80.0–100.0)
Monocytes Absolute: 1.3 10*3/uL — ABNORMAL HIGH (ref 0.1–1.0)
Monocytes Relative: 7 %
Neutro Abs: 14.1 10*3/uL — ABNORMAL HIGH (ref 1.7–7.7)
Neutrophils Relative %: 79 %
Platelets: 378 10*3/uL (ref 150–400)
RBC: 4.31 MIL/uL (ref 4.22–5.81)
RDW: 11.9 % (ref 11.5–15.5)
WBC: 17.6 10*3/uL — ABNORMAL HIGH (ref 4.0–10.5)
nRBC: 0 % (ref 0.0–0.2)

## 2023-05-31 LAB — COMPREHENSIVE METABOLIC PANEL
ALT: 19 U/L (ref 0–44)
AST: 12 U/L — ABNORMAL LOW (ref 15–41)
Albumin: 2.9 g/dL — ABNORMAL LOW (ref 3.5–5.0)
Alkaline Phosphatase: 93 U/L (ref 38–126)
Anion gap: 10 (ref 5–15)
BUN: 11 mg/dL (ref 8–23)
CO2: 25 mmol/L (ref 22–32)
Calcium: 8.4 mg/dL — ABNORMAL LOW (ref 8.9–10.3)
Chloride: 93 mmol/L — ABNORMAL LOW (ref 98–111)
Creatinine, Ser: 0.91 mg/dL (ref 0.61–1.24)
GFR, Estimated: >60 mL/min (ref >60)
Glucose, Bld: 414 mg/dL — ABNORMAL HIGH (ref 70–99)
Potassium: 3.5 mmol/L (ref 3.5–5.1)
Sodium: 128 mmol/L — ABNORMAL LOW (ref 135–145)
Total Bilirubin: 1 mg/dL (ref 0.3–1.2)
Total Protein: 7 g/dL (ref 6.5–8.1)

## 2023-05-31 LAB — URINALYSIS, ROUTINE W REFLEX MICROSCOPIC
Bacteria, UA: NONE SEEN
Bilirubin Urine: NEGATIVE
Glucose, UA: >=500 mg/dL — AB
Ketones, ur: 5 mg/dL — AB
Nitrite: NEGATIVE
Protein, ur: NEGATIVE mg/dL
Specific Gravity, Urine: 1.027 (ref 1.005–1.030)
WBC, UA: >50 WBC/hpf (ref 0–5)
pH: 6 (ref 5.0–8.0)

## 2023-05-31 LAB — LACTIC ACID, PLASMA
Lactic Acid, Venous: 1.1 mmol/L (ref 0.5–1.9)
Lactic Acid, Venous: 1 mmol/L (ref 0.5–1.9)

## 2023-05-31 LAB — HEMOGLOBIN A1C
Hgb A1c MFr Bld: 12.6 % — ABNORMAL HIGH (ref 4.8–5.6)
Mean Plasma Glucose: 314.92 mg/dL

## 2023-05-31 LAB — GLUCOSE, CAPILLARY
Glucose-Capillary: 353 mg/dL — ABNORMAL HIGH (ref 70–99)
Glucose-Capillary: 363 mg/dL — ABNORMAL HIGH (ref 70–99)
Glucose-Capillary: 261 mg/dL — ABNORMAL HIGH (ref 70–99)

## 2023-05-31 MED ORDER — FENTANYL CITRATE PF 50 MCG/ML IJ SOSY
50.0000 ug | PREFILLED_SYRINGE | Freq: Once | INTRAMUSCULAR | Status: AC
  Administered 2023-05-31: 50 ug via INTRAVENOUS
  Filled 2023-05-31: qty 1

## 2023-05-31 MED ORDER — INSULIN GLARGINE-YFGN 100 UNIT/ML ~~LOC~~ SOLN
45.0000 [IU] | Freq: Every day | SUBCUTANEOUS | Status: DC
  Administered 2023-05-31 – 2023-06-06 (×7): 45 [IU] via SUBCUTANEOUS
  Filled 2023-05-31 (×8): qty 0.45

## 2023-05-31 MED ORDER — GABAPENTIN 100 MG PO CAPS: 100.0000 mg | ORAL_CAPSULE | Freq: Three times a day (TID) | ORAL | Status: DC

## 2023-05-31 MED ORDER — PANTOPRAZOLE SODIUM 40 MG PO TBEC
40.0000 mg | DELAYED_RELEASE_TABLET | Freq: Every day | ORAL | Status: DC
  Administered 2023-05-31 – 2023-06-06 (×7): 40 mg via ORAL
  Filled 2023-05-31 (×6): qty 1

## 2023-05-31 MED ORDER — SODIUM CHLORIDE 0.9 % IV SOLN
2.0000 g | Freq: Once | INTRAVENOUS | Status: AC
Start: 2023-05-31 — End: 2023-05-31
  Administered 2023-05-31: 2 g via INTRAVENOUS
  Filled 2023-05-31: qty 20

## 2023-05-31 MED ORDER — ONDANSETRON 4 MG PO TBDP
8.0000 mg | ORAL_TABLET | Freq: Three times a day (TID) | ORAL | Status: DC
  Administered 2023-05-31 (×2): 8 mg via ORAL
  Filled 2023-05-31 (×11): qty 2

## 2023-05-31 MED ORDER — MORPHINE SULFATE (PF) 2 MG/ML IV SOLN
2.0000 mg | INTRAVENOUS | Status: DC | PRN
  Administered 2023-05-31 – 2023-06-01 (×5): 2 mg via INTRAVENOUS
  Filled 2023-05-31 (×5): qty 1

## 2023-05-31 MED ORDER — INSULIN ASPART 100 UNIT/ML FLEXPEN: 15.0000 [IU] | PEN_INJECTOR | Freq: Three times a day (TID) | SUBCUTANEOUS | Status: DC

## 2023-05-31 MED ORDER — INSULIN ASPART 100 UNIT/ML IJ SOLN
0.0000 [IU] | Freq: Every day | INTRAMUSCULAR | Status: DC
  Administered 2023-05-31: 3 [IU] via SUBCUTANEOUS
  Administered 2023-06-02: 2 [IU] via SUBCUTANEOUS
  Filled 2023-05-31 (×3): qty 1

## 2023-05-31 MED ORDER — METFORMIN HCL 500 MG PO TABS: 500.0000 mg | ORAL_TABLET | Freq: Every day | ORAL | Status: DC

## 2023-05-31 MED ORDER — DOCUSATE SODIUM 100 MG PO CAPS
100.0000 mg | ORAL_CAPSULE | Freq: Two times a day (BID) | ORAL | Status: DC
  Administered 2023-05-31 – 2023-06-04 (×9): 100 mg via ORAL
  Filled 2023-05-31 (×12): qty 1

## 2023-05-31 MED ORDER — ALBUTEROL SULFATE (2.5 MG/3ML) 0.083% IN NEBU: 3.0000 mL | INHALATION_SOLUTION | RESPIRATORY_TRACT | Status: DC | PRN

## 2023-05-31 MED ORDER — ONDANSETRON HCL 4 MG/2ML IJ SOLN: 4.0000 mg | Freq: Four times a day (QID) | INTRAMUSCULAR | Status: DC | PRN

## 2023-05-31 MED ORDER — MECLIZINE HCL 25 MG PO TABS: 25.0000 mg | ORAL_TABLET | Freq: Three times a day (TID) | ORAL | Status: DC | PRN

## 2023-05-31 MED ORDER — ENOXAPARIN SODIUM 60 MG/0.6ML IJ SOSY
0.5000 mg/kg | PREFILLED_SYRINGE | INTRAMUSCULAR | Status: DC
Start: 2023-05-31 — End: 2023-06-06
  Administered 2023-05-31 – 2023-06-05 (×6): 47.5 mg via SUBCUTANEOUS
  Filled 2023-05-31 (×6): qty 0.6

## 2023-05-31 MED ORDER — ATORVASTATIN CALCIUM 20 MG PO TABS: 10.0000 mg | ORAL_TABLET | Freq: Every day | ORAL | Status: DC

## 2023-05-31 MED ORDER — IBUPROFEN 400 MG PO TABS
400.0000 mg | ORAL_TABLET | Freq: Four times a day (QID) | ORAL | Status: DC | PRN
  Administered 2023-05-31 – 2023-06-06 (×9): 400 mg via ORAL
  Filled 2023-05-31 (×10): qty 1

## 2023-05-31 MED ORDER — SODIUM CHLORIDE 0.9 % IV BOLUS
1000.0000 mL | Freq: Once | INTRAVENOUS | Status: AC
  Administered 2023-05-31: 1000 mL via INTRAVENOUS

## 2023-05-31 MED ORDER — IOHEXOL 300 MG/ML  SOLN
100.0000 mL | Freq: Once | INTRAMUSCULAR | Status: AC | PRN
  Administered 2023-05-31: 100 mL via INTRAVENOUS

## 2023-05-31 MED ORDER — SODIUM CHLORIDE 0.9 % IV SOLN
2.0000 g | INTRAVENOUS | Status: DC
  Administered 2023-06-01 – 2023-06-02 (×2): 2 g via INTRAVENOUS
  Filled 2023-05-31 (×2): qty 20

## 2023-05-31 MED ORDER — INSULIN GLARGINE-YFGN 100 UNIT/ML ~~LOC~~ SOLN: 65.0000 [IU] | Freq: Every day | SUBCUTANEOUS | Status: DC

## 2023-05-31 MED ORDER — INSULIN ASPART 100 UNIT/ML IJ SOLN
14.0000 [IU] | Freq: Three times a day (TID) | INTRAMUSCULAR | Status: DC
  Administered 2023-05-31 – 2023-06-06 (×18): 14 [IU] via SUBCUTANEOUS
  Filled 2023-05-31 (×19): qty 1

## 2023-05-31 MED ORDER — CYCLOBENZAPRINE HCL 10 MG PO TABS
5.0000 mg | ORAL_TABLET | Freq: Three times a day (TID) | ORAL | Status: DC | PRN
  Administered 2023-06-01: 5 mg via ORAL
  Filled 2023-05-31: qty 1

## 2023-05-31 MED ORDER — FLUTICASONE PROPIONATE 50 MCG/ACT NA SUSP: 1.0000 | Freq: Every day | NASAL | Status: DC | PRN

## 2023-05-31 MED ORDER — OXYCODONE-ACETAMINOPHEN 5-325 MG PO TABS
1.0000 | ORAL_TABLET | Freq: Four times a day (QID) | ORAL | Status: DC | PRN
  Administered 2023-05-31 – 2023-06-05 (×10): 1 via ORAL
  Filled 2023-05-31 (×10): qty 1

## 2023-05-31 MED ORDER — LORATADINE 10 MG PO TABS: 10.0000 mg | ORAL_TABLET | Freq: Every day | ORAL | Status: DC

## 2023-05-31 MED ORDER — ONDANSETRON HCL 4 MG PO TABS
4.0000 mg | ORAL_TABLET | Freq: Four times a day (QID) | ORAL | Status: DC | PRN
  Filled 2023-05-31: qty 1

## 2023-05-31 MED ORDER — TAMSULOSIN HCL 0.4 MG PO CAPS
0.4000 mg | ORAL_CAPSULE | Freq: Every day | ORAL | Status: DC
  Administered 2023-05-31 – 2023-06-05 (×6): 0.4 mg via ORAL
  Filled 2023-05-31 (×6): qty 1

## 2023-05-31 MED ORDER — ENOXAPARIN SODIUM 40 MG/0.4ML IJ SOSY: 40.0000 mg | PREFILLED_SYRINGE | INTRAMUSCULAR | Status: DC

## 2023-05-31 MED ORDER — INSULIN ASPART 100 UNIT/ML IJ SOLN
0.0000 [IU] | Freq: Three times a day (TID) | INTRAMUSCULAR | Status: DC
  Administered 2023-05-31: 20 [IU] via SUBCUTANEOUS
  Administered 2023-06-01: 4 [IU] via SUBCUTANEOUS
  Administered 2023-06-01: 3 [IU] via SUBCUTANEOUS
  Administered 2023-06-01: 7 [IU] via SUBCUTANEOUS
  Administered 2023-06-02: 11 [IU] via SUBCUTANEOUS
  Administered 2023-06-02: 15 [IU] via SUBCUTANEOUS
  Administered 2023-06-02: 20 [IU] via SUBCUTANEOUS
  Administered 2023-06-03: 15 [IU] via SUBCUTANEOUS
  Administered 2023-06-03: 11 [IU] via SUBCUTANEOUS
  Administered 2023-06-03 – 2023-06-04 (×2): 4 [IU] via SUBCUTANEOUS
  Administered 2023-06-04 (×2): 7 [IU] via SUBCUTANEOUS
  Administered 2023-06-05 – 2023-06-06 (×4): 4 [IU] via SUBCUTANEOUS
  Filled 2023-05-31 (×17): qty 1

## 2023-05-31 MED ORDER — BISMUTH SUBSALICYLATE 262 MG PO CHEW: 524.0000 mg | CHEWABLE_TABLET | ORAL | Status: DC | PRN

## 2023-05-31 NOTE — Plan of Care (Signed)

## 2023-05-31 NOTE — H&P (Addendum)
History and Physical    Michael Gill:[id number] DOB: 06-03-1961 DOA: 05/31/2023  PCP: Oswaldo Conroy, MD (Confirm with patient/family/NH records and if not entered, this has to be entered at [facility] point of entry) Patient coming from: Home  I have personally briefly reviewed patient's old medical records in [facility]  Chief Complaint: Right side belly hurts  HPI: Michael Gill is a 62 y.o. male with medical history significant of IDDM with insulin resistance, CAD, HTN, DVT, chronic benign left parotid tumor, chronic peripheral neuropathy, presented with worsening of right flank pain.  Symptoms started 4 days ago with gradual onset of right-sided flank pain, dull ache, radiating to right flank area, associate with nausea but no vomiting.  Patient also has been having increased urinary frequency and burning sensation.  Denies any fever or chills.  ED Course: Temperature 99.1, tachycardia heart rate 100s-110s, blood pressure 130/80, O2 saturation 99% on room air.  Blood work showed WBC 17.6, hemoglobin 13.4, sodium 128, potassium 3.5, creatinine 0.9, glucose 414.  CT abdomen pelvis showed heterogenous enhancement of the right kidney with several adjacent perinephric stranding implying sequela of renal infection with pyelonephritis.  UA compatible with acute UTI with hematuria  Patient was started on ceftriaxone  Review of Systems: As per HPI otherwise 14 point review of systems negative.    Past Medical History:  Diagnosis Date   Asthma    B12 deficiency    in the past   CAD (coronary artery disease)    s/p MI   Colon cancer (HCC) 1990   Complication of anesthesia    long time to wake up from colonoscopy.   Conversion disorder    difficulty with visual disturbances and changes with unknown etiology, along with headaches   CVA (cerebral infarction)    Diabetes mellitus (HCC)    type 2 insulin dependant   DVT (deep venous thrombosis) (HCC) 2016   behind right knee    Dyspnea 09/2018   with exercise   Dysrhythmia    has had Afib in past.   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   Seizure (HCC)    last episode 08/2017. has no warning, will just happen. 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 He has never used smokeless tobacco. He reports that he does not drink alcohol and does not use drugs.  Allergies  Allergen Reactions   Shellfish Allergy Hives    Swelling of throat and mouth. Betadine is NOT a problem   Codeine Other (See Comments)  Reaction:  Hallucinations    Sulfa Antibiotics Hives    Family History  Problem Relation Age of Onset   Alzheimer's disease Mother    Congestive Heart Failure Father    Colon cancer Brother      Prior to Admission medications   Medication Sig Start Date End Date Taking? Authorizing Provider  acetaminophen (TYLENOL) 500 MG tablet Take 500 mg by mouth every 6 (six) hours as needed for moderate pain.   Yes [provider]  albuterol (PROVENTIL HFA;VENTOLIN HFA) 108 (90 Base) MCG/ACT inhaler Inhale 1-2 puffs into the lungs every 4 (four) hours as needed for  wheezing or shortness of breath.   Yes [provider]  bismuth subsalicylate (PEPTO BISMOL) 262 MG chewable tablet Chew 524 mg by mouth as needed for indigestion or diarrhea or loose stools.    Yes [provider]  cetirizine (ZYRTEC) 10 MG tablet Take 10 mg by mouth daily.   Yes [provider]  Dextromethorphan-guaiFENesin 10-100 MG/5ML liquid Take 10 mLs by mouth every 6 (six) hours as needed. 05/24/23  Yes [provider]  docusate sodium (COLACE) 100 MG capsule Take 100 mg by mouth 2 (two) times daily.   Yes [provider]  ELIQUIS 2.5 MG TABS tablet TAKE (1) TABLET BY MOUTH TWICE DAILY. 02/24/20  Yes Rickard Patience, MD  fluticasone [facility]) 50 MCG/ACT nasal spray Place 1 spray into both nostrils daily as needed for allergies or rhinitis.   Yes [provider]  insulin aspart (NOVOLOG) 100 UNIT/ML injection Inject 14 Units into the skin 3 (three) times daily with meals.   Yes [provider]  insulin glargine (LANTUS) 100 UNIT/ML injection Inject 45 Units into the skin 2 (two) times daily.   Yes [provider]  meclizine (ANTIVERT) 25 MG tablet Take 25 mg by mouth 3 (three) times daily as needed for dizziness.   Yes [provider]  omeprazole (PRILOSEC) 40 MG capsule Take 40 mg by mouth daily as needed (GERD symptoms).    Yes [provider]    Physical Exam: Vitals:   05/31/23 1048 05/31/23 1108 05/31/23 1200 05/31/23 1323  BP: 139/81 137/78  136/74  Pulse:  89 84 95  Resp:  18 20 20   Temp:  99.1 F (37.3 C)  99 F (37.2 C)  TempSrc:  Oral  Oral  SpO2:  94% 95% 99%  Weight:      Height:        Constitutional: NAD, calm, comfortable Vitals:   05/31/23 1048 05/31/23 1108 05/31/23 1200 05/31/23 1323  BP: 139/81 137/78  136/74  Pulse:  89 84 95  Resp:  18 20 20   Temp:  99.1 F (37.3 C)  99 F (37.2 C)  TempSrc:  Oral  Oral  SpO2:  94% 95% 99%  Weight:      Height:       Eyes: PERRL,  lids and conjunctivae normal ENMT: Mucous membranes are moist. Posterior pharynx clear of any exudate or lesions.Normal dentition.  Neck: normal, supple, no masses, no thyromegaly.  Enlarged left parotid gland Respiratory: clear to auscultation bilaterally, no wheezing, no crackles. Normal respiratory effort. No accessory muscle use.  Cardiovascular: Regular rate and rhythm, no murmurs / rubs / gallops. No extremity edema. 2+ pedal pulses. No carotid bruits.  Abdomen: Positive right CVA tenderness, no masses palpated. No hepatosplenomegaly. Bowel sounds positive.  Musculoskeletal: no clubbing / cyanosis. No joint deformity upper and lower extremities. Good ROM, no contractures. Normal muscle  tone.  Skin: no rashes, lesions, ulcers. No induration Neurologic: CN 2-12 grossly intact. Sensation intact, DTR normal. Strength 5/5 in all 4.  Psychiatric: Normal judgment and insight. Alert and oriented x 3. Normal mood.     Labs on Admission: I have personally reviewed following labs and imaging studies  CBC: Recent Labs  Lab 05/31/23 0646  WBC 17.6*  NEUTROABS 14.1*  HGB 13.4  HCT 38.1*  MCV 88.4  PLT 378   Basic Metabolic Panel: Recent Labs  Lab 05/31/23 0646  NA 128*  K 3.5  CL 93*  CO2 25  GLUCOSE 414*  BUN 11  CREATININE 0.91  CALCIUM 8.4*   GFR: Estimated Creatinine Clearance: 93.4 mL/min (by C-G formula based on SCr of 0.91 mg/dL). Liver Function Tests: Recent Labs  Lab 05/31/23 0646  AST 12*  ALT 19  ALKPHOS 93  BILITOT 1.0  PROT 7.0  ALBUMIN 2.9*   No results for input(s): "LIPASE", "AMYLASE" in the last 168 hours. No results for input(s): "AMMONIA" in the last 168 hours. Coagulation Profile: No results for input(s): "INR", "PROTIME" in the last 168 hours. Cardiac Enzymes: No results for input(s): "CKTOTAL", "CKMB", "CKMBINDEX", "TROPONINI" in the last 168 hours. BNP (last 3 results) No results for input(s): "PROBNP" in the last 8760 hours. HbA1C: No  results for input(s): "HGBA1C" in the last 72 hours. CBG: No results for input(s): "GLUCAP" in the last 168 hours. Lipid Profile: No results for input(s): "CHOL", "HDL", "LDLCALC", "TRIG", "CHOLHDL", "LDLDIRECT" in the last 72 hours. Thyroid Function Tests: No results for input(s): "TSH", "T4TOTAL", "FREET4", "T3FREE", "THYROIDAB" in the last 72 hours. Anemia Panel: No results for input(s): "VITAMINB12", "FOLATE", "FERRITIN", "TIBC", "IRON", "RETICCTPCT" in the last 72 hours. Urine analysis:    Component Value Date/Time   COLORURINE YELLOW (A) 05/31/2023 0646   APPEARANCEUR CLOUDY (A) 05/31/2023 0646   APPEARANCEUR Hazy 11/01/2014 1413   LABSPEC 1.027 05/31/2023 0646   LABSPEC 1.012 11/01/2014 1413   PHURINE 6.0 05/31/2023 0646   GLUCOSEU >=500 (A) 05/31/2023 0646   GLUCOSEU 150 mg/dL 34/74/2595 6387   HGBUR LARGE (A) 05/31/2023 0646   BILIRUBINUR NEGATIVE 05/31/2023 0646   BILIRUBINUR Negative 11/01/2014 1413   KETONESUR 5 (A) 05/31/2023 0646   PROTEINUR NEGATIVE 05/31/2023 0646   NITRITE NEGATIVE 05/31/2023 0646   LEUKOCYTESUR LARGE (A) 05/31/2023 0646   LEUKOCYTESUR Negative 11/01/2014 1413    Radiological Exams on Admission: US SOFT TISSUE HEAD & NECK (NON-THYROID)  Result Date: 05/31/2023 CLINICAL DATA:  Abscess of parotid masseteric region. EXAM: ULTRASOUND OF HEAD/NECK SOFT TISSUES TECHNIQUE: Ultrasound examination of the head and neck soft tissues was performed in the area of clinical concern. COMPARISON:  Neck CT 09/19/2022 FINDINGS: Hypoechoic masses in the left parotid which are numerous. The most superficial appears solid with branching internal color Doppler flow, up to 2.9 cm in length. This lesion measures larger than the 19 x 12 mm size on prior neck CT. The largest lesion is in the deep parotid and measures up to 3.4 x 2.2 x 2.5 cm (per my measurements) and has less internal blood flow suggesting partially cystic characteristics, which was also seen on prior CT. The  patient presents with fever. No clear parotid parenchymal edema or ductal dilatation. IMPRESSION: Multiple left parotid masses, the largest measuring nearly 4 cm with suggested cystic/cavitary component, features and size also seen on 03/02/2023 neck CT. No specific signs of abscess or parotiditis. Electronically Signed   By: Tiburcio Pea M.D.   On: 05/31/2023  12:34   CT ABDOMEN PELVIS W CONTRAST  Result Date: 05/31/2023 CLINICAL DATA:  Abdominal pain that is acute and nonlocalized. EXAM: CT ABDOMEN AND PELVIS WITH CONTRAST TECHNIQUE: Multidetector CT imaging of the abdomen and pelvis was performed using the standard protocol following bolus administration of intravenous contrast. RADIATION DOSE REDUCTION: This exam was performed according to the departmental dose-optimization program which includes automated exposure control, adjustment of the mA and/or kV according to patient size and/or use of iterative reconstruction technique. CONTRAST:  OMNIPAQUE IOHEXOL 300 MG/ML  SOLN COMPARISON:  CT 05/07/2019. FINDINGS: Lower chest: There are peripheral lung base interstitial areas of thickening. Please correlate for any known history including scarring or fibrotic change. No pleural effusion. Few punctate calcifications as well, possibly related to old granulomatous disease. Hepatobiliary: No focal liver abnormality is seen. No gallstones, gallbladder wall thickening, or biliary dilatation. Patent portal vein Pancreas: Unremarkable. No pancreatic ductal dilatation or surrounding inflammatory changes. Spleen: Normal in size without focal abnormality. Adrenals/Urinary Tract: Adrenal glands are preserved. Left kidney has some mild perinephric stranding, nonspecific. There is a Bosniak 1 midportion cysts, similar to previous a Bosniak 2 lower pole tiny focus. No follow up is recommended of these lesions. No collecting system dilatation of the left with a normal course and caliber of the left ureter down to the  bladder. Preserved contours of the urinary bladder. The right kidney has a severe perinephric stranding. There are areas of heterogeneous enhancement of the right kidney with wedge-shaped like areas as well as some more confluence lower density cystic like areas as well. Example lower pole series 2, image 54 measuring 15 mm, series 2, image 47 posteromedial measuring 18 mm. There is extrarenal pelvis of the right kidney with some urothelial thickening and a UPJ transition. More distal ureter is slightly ectatic with a urothelial thickening and adjacent stranding down to the bladder. Stomach/Bowel: On this non oral contrast exam large bowel has a scattered colonic stool and is nondilated. There is redundant sigmoid colon extending into the midabdomen. Few colonic diverticula. The appendix is not well seen but no pericecal stranding or fluid. Stomach and small bowel are nondilated. Vascular/Lymphatic: Normal caliber aorta and IVC with atherosclerotic changes. There are several small lymph nodes along the retroperitoneum which are not pathologic by size criteria but more numerous than usually seen and could be reactive related to the right renal findings. Reproductive: Preserved seminal vesicles. Enlarged prostate with some low-density areas inferiorly such as series 2, image 94. Please correlate with any prostate symptoms. Other: No free intra-air. Musculoskeletal: Scattered degenerative changes of the spine and pelvis. Transitional lumbosacral segment. IMPRESSION: Heterogeneous enhancement of the right kidney with severe adjacent perinephric stranding. There is also some ill-defined cystic areas in the kidney. There is also urothelial thickening along the course of the right ureter and renal pelvis. Based on appearance this could be sequela of renal infection with pyelonephritis. If so, the cystic areas could represent early abscess formation or phlegmonous change. Recommend close follow-up. Enlarged prostate with  some low-density almost cystic like area inferiorly. This is of uncertain etiology and significance. Please correlate with any prostate symptoms. Presumed multiple reactive retroperitoneal nodes. Diffuse colonic stool without obstruction.  Few colonic diverticula. Electronically Signed   By: Karen Kays M.D.   On: 05/31/2023 10:54    EKG: None  Assessment/Plan Principal Problem:   Pyelonephritis Active Problems:   Acute pyelonephritis   BPH (benign prostatic hyperplasia)  (please populate well all problems here in Problem List. (  For example, if patient is on BP meds at home and you resume or decide to hold them, it is a problem that needs to be her. Same for CAD, COPD, HLD and so on)  Acute right pyelonephritis and complicated UTI -Continue ceftriaxone -Urine culture pending -Check PVR, clinically suspect underlying undiagnosed BPH, will start Flomax -CT imaging has a concern of early abscess.  Plan to treat with antibiotics and monitor clinical progress, consider repeat CT image study if symptoms not improving.  Will change Eliquis to subcu Lovenox in case patient will need any surgical intervention to drain the kidney infection.  SIRS -Secondary to pyelonephritis, management as above  Hyponatremia -Secondary to hyperglycemia  Chronic left-sided parotid mass -Again demonstrated on today's left neck soft tissue ultrasound.  The parotid gland mass on the left side can be traced back to 2022 when patient had fine-needle biopsy, and pathology showed likely benign tumor.  Recommend he continue to follow-up with ENT if symptoms resume.  IDDM with hyperglycemia with hyperglycemia -Continue Lantus 45 units daily -NovoLog 15 unit 3 times daily AC -Sliding scale -Add metformin  Hx of DVT -Change prophylactic Eliquis to subcu Lovenox  DVT prophylaxis: Lovenox Code Status: Full code Family Communication: None at bedside Disposition Plan: Patient sick with acute pyelonephritis requiring  IV antibiotics, expect more than 2 midnight hospital stay Consults called: None Admission status: Tele admit   Emeline General MD [facility] Pager [phone]  05/31/2023, 2:08 PM

## 2023-05-31 NOTE — ED Provider Notes (Signed)
[facility] Provider Note    Event Date/Time   First MD Initiated Contact with Patient 05/31/23 [phone]     (approximate)   History   Chief Complaint: Facial Swelling   HPI  Michael Gill is a 62 y.o. male with a history of PE, diabetes, GERD who comes ED complaining of malaise, parotid gland swelling for the last 5 days.  Also has dysuria.  Denies fever.  Also complains of right lower quadrant abdominal pain, nonradiating, no back pain.     Physical Exam   Triage Vital Signs: ED Triage Vitals  Encounter Vitals Group     BP 05/31/23 0642 130/80     Systolic BP Percentile --      Diastolic BP Percentile --      Pulse Rate 05/31/23 0642 (!) 102     Resp 05/31/23 0642 18     Temp 05/31/23 0642 97.8 F (36.6 C)     Temp Source 05/31/23 0642 Oral     SpO2 05/31/23 0642 99 %     Weight 05/31/23 0643 212 lb (96.2 kg)     Height 05/31/23 0643 5\' 8"  (1.727 m)     Head Circumference --      Peak Flow --      Pain Score 05/31/23 0643 7     Pain Loc --      Pain Education --      Exclude from Growth Chart --     Most recent vital signs: Vitals:   05/31/23 1108 05/31/23 1200  BP: 137/78   Pulse: 89 84  Resp: 18 20  Temp: 99.1 F (37.3 C)   SpO2: 94% 95%    General: Awake, no distress.  CV:  Good peripheral perfusion.  Regular rate and rhythm, heart rate 80 Resp:  Normal effort.  Clear to auscultation bilaterally Abd:  No distention.  Soft with generalized tenderness.  No peritonitis Other:  Moist oral mucosa.  Sizable left parotid swelling with tenderness, somewhat fluctuant.  No overlying erythema.   ED Results / Procedures / Treatments   Labs (all labs ordered are listed, but only abnormal results are displayed) Labs Reviewed  CBC WITH DIFFERENTIAL/PLATELET - Abnormal; Notable for the following components:      Result Value   WBC 17.6 (*)    HCT 38.1 (*)    Neutro Abs 14.1 (*)    Monocytes Absolute 1.3 (*)    Abs Immature Granulocytes  0.62 (*)    All other components within normal limits  COMPREHENSIVE METABOLIC PANEL - Abnormal; Notable for the following components:   Sodium 128 (*)    Chloride 93 (*)    Glucose, Bld 414 (*)    Calcium 8.4 (*)    Albumin 2.9 (*)    AST 12 (*)    All other components within normal limits  URINALYSIS, ROUTINE W REFLEX MICROSCOPIC - Abnormal; Notable for the following components:   Color, Urine YELLOW (*)    APPearance CLOUDY (*)    Glucose, UA >=500 (*)    Hgb urine dipstick LARGE (*)    Ketones, ur 5 (*)    Leukocytes,Ua LARGE (*)    All other components within normal limits  LACTIC ACID, PLASMA  LACTIC ACID, PLASMA     EKG    RADIOLOGY CT abd/pelvis interpreted by me, negative for ureterolithiasis or bowel perforation. Radiology report reviewed   PROCEDURES:  Procedures   MEDICATIONS ORDERED IN ED: Medications  sodium chloride 0.9 %  bolus 1,000 mL (1,000 mLs Intravenous New Bag/Given 05/31/23 0936)  cefTRIAXone (ROCEPHIN) 2 g in sodium chloride 0.9 % 100 mL IVPB (0 g Intravenous Stopped 05/31/23 1008)  iohexol (OMNIPAQUE) 300 MG/ML solution 100 mL (100 mLs Intravenous Contrast Given 05/31/23 0919)  fentaNYL (SUBLIMAZE) injection 50 mcg (50 mcg Intravenous Given 05/31/23 1108)     IMPRESSION / MDM / ASSESSMENT AND PLAN / ED COURSE  I reviewed the triage vital signs and the nursing notes.  DDx: UTI, appendicitis, ureterolithiasis, pyelonephritis, diverticulitis, parotid abscess  Patient's presentation is most consistent with acute presentation with potential threat to life or bodily function.  Patient presents with abdominal pain and dysuria, initial urinalysis consistent with UTI.  Will obtain CT to ensure he does not have ureteral obstruction or pyelonephritis.  Airway is patent without evidence of impending compromise.  Will ultrasound left parotid to look for abscess.  ----------------------------------------- 12:22 PM on  05/31/2023 ----------------------------------------- CT confirmed pyelonephritis. Pt still in subtantial pain.  Case d/w hospitalist.      FINAL CLINICAL IMPRESSION(S) / ED DIAGNOSES   Final diagnoses:  Pyelonephritis  Type 2 diabetes mellitus with hyperglycemia, with long-term current use of insulin (HCC)     Rx / DC Orders   ED Discharge Orders     None        Note:  This document was prepared using Dragon voice recognition software and may include unintentional dictation errors.   Sharman Cheek, MD 05/31/23 [phone]

## 2023-05-31 NOTE — ED Notes (Signed)
Pt at U/S

## 2023-05-31 NOTE — ED Triage Notes (Addendum)
Patient ambulatory to triage with steady gait, without difficulty or distress noted; pt reports parotid gland swelling since Saturday; was seen by PCP but not rx meds; st this has been a common occurrence since his stroke several yrs ago but it has never been to this extent; denies any fever, cough or congestion but reports some dysuria and hesitancy

## 2023-05-31 NOTE — ED Notes (Signed)
Pt up to restroom.

## 2023-05-31 NOTE — Progress Notes (Signed)
PHARMACIST - PHYSICIAN COMMUNICATION  CONCERNING:  Enoxaparin (Lovenox) for DVT Prophylaxis    RECOMMENDATION: Patient was prescribed enoxaprin 40mg  q24 hours for VTE prophylaxis.   Filed Weights   05/31/23 0643  Weight: 96.2 kg (212 lb)    Body mass index is 32.23 kg/m.  Estimated Creatinine Clearance: 93.4 mL/min (by C-G formula based on SCr of 0.91 mg/dL).   Based on [facility] policy patient is candidate for enoxaparin 0.5mg /kg TBW SQ every 24 hours based on BMI being >30.  DESCRIPTION: Pharmacy has adjusted enoxaparin dose per [facility] policy.  Patient is now receiving enoxaparin 0.5 mg/kg every 24 hours    Lowella Bandy, PharmD Clinical Pharmacist  05/31/2023 1:32 PM

## 2023-06-01 DIAGNOSIS — E1165 Type 2 diabetes mellitus with hyperglycemia: Secondary | ICD-10-CM | POA: Diagnosis not present

## 2023-06-01 DIAGNOSIS — N2889 Other specified disorders of kidney and ureter: Secondary | ICD-10-CM

## 2023-06-01 DIAGNOSIS — N12 Tubulo-interstitial nephritis, not specified as acute or chronic: Secondary | ICD-10-CM | POA: Diagnosis not present

## 2023-06-01 LAB — BASIC METABOLIC PANEL
Anion gap: 10 (ref 5–15)
BUN: 13 mg/dL (ref 8–23)
CO2: 25 mmol/L (ref 22–32)
Calcium: 8.1 mg/dL — ABNORMAL LOW (ref 8.9–10.3)
Chloride: 96 mmol/L — ABNORMAL LOW (ref 98–111)
Creatinine, Ser: 0.8 mg/dL (ref 0.61–1.24)
GFR, Estimated: >60 mL/min (ref >60)
Glucose, Bld: 231 mg/dL — ABNORMAL HIGH (ref 70–99)
Potassium: 3.5 mmol/L (ref 3.5–5.1)
Sodium: 131 mmol/L — ABNORMAL LOW (ref 135–145)

## 2023-06-01 LAB — CBC
HCT: 37.5 % — ABNORMAL LOW (ref 39.0–52.0)
Hemoglobin: 12.8 g/dL — ABNORMAL LOW (ref 13.0–17.0)
MCH: 30.9 pg (ref 26.0–34.0)
MCHC: 34.1 g/dL (ref 30.0–36.0)
MCV: 90.6 fL (ref 80.0–100.0)
Platelets: 373 10*3/uL (ref 150–400)
RBC: 4.14 MIL/uL — ABNORMAL LOW (ref 4.22–5.81)
RDW: 12.2 % (ref 11.5–15.5)
WBC: 16 10*3/uL — ABNORMAL HIGH (ref 4.0–10.5)
nRBC: 0 % (ref 0.0–0.2)

## 2023-06-01 LAB — GLUCOSE, CAPILLARY
Glucose-Capillary: 200 mg/dL — ABNORMAL HIGH (ref 70–99)
Glucose-Capillary: 220 mg/dL — ABNORMAL HIGH (ref 70–99)
Glucose-Capillary: 134 mg/dL — ABNORMAL HIGH (ref 70–99)
Glucose-Capillary: 169 mg/dL — ABNORMAL HIGH (ref 70–99)

## 2023-06-01 LAB — HIV ANTIBODY (ROUTINE TESTING W REFLEX): HIV Screen 4th Generation wRfx: NONREACTIVE

## 2023-06-01 MED ORDER — FLUCONAZOLE IN SODIUM CHLORIDE 200-0.9 MG/100ML-% IV SOLN
200.0000 mg | INTRAVENOUS | Status: DC
  Administered 2023-06-01 – 2023-06-02 (×2): 200 mg via INTRAVENOUS
  Filled 2023-06-01 (×3): qty 100

## 2023-06-01 MED ORDER — KETOROLAC TROMETHAMINE 15 MG/ML IJ SOLN
15.0000 mg | Freq: Once | INTRAMUSCULAR | Status: AC
  Administered 2023-06-01: 15 mg via INTRAVENOUS
  Filled 2023-06-01: qty 1

## 2023-06-01 MED ORDER — DEXAMETHASONE SODIUM PHOSPHATE 10 MG/ML IJ SOLN
6.0000 mg | Freq: Once | INTRAMUSCULAR | Status: AC
  Administered 2023-06-01: 6 mg via INTRAVENOUS
  Filled 2023-06-01: qty 1

## 2023-06-01 MED ORDER — LIVING WELL WITH DIABETES BOOK
Freq: Once | Status: AC
  Filled 2023-06-01: qty 1

## 2023-06-01 MED ORDER — SODIUM CHLORIDE 0.9 % IV SOLN: INTRAVENOUS | Status: AC

## 2023-06-01 NOTE — Progress Notes (Signed)
PROGRESS NOTE    Michael Gill  UJW:[id number] DOB: 08-25-1960 DOA: 05/31/2023 PCP: Oswaldo Conroy, MD   Brief Narrative: 62 year old with past medical history significant for IDDM with insulin resistance, CAD, hypertension, DVT, chronic benign left parotid tumor, chronic peripheral neuropathy presents with worsening right flank pain.  Symptoms started 4 days prior to admission.  Evaluation in the ED he was tachycardic, leukocytosis white blood cell 17 hyponatremia sodium 128.  CT abdomen and pelvis show anterior genius enhancement of the right kidney is with several active send the perinephric stranding implying sequela of renal infection with pyelonephritis.  UA compatible with UTI and hematuria.     Assessment & Plan:   Principal Problem:   Pyelonephritis Active Problems:   Acute pyelonephritis   BPH (benign prostatic hyperplasia)   1-Acute pyelonephritis CT abdomen and pelvis:Heterogeneous enhancement of the right kidney with severe adjacent perinephric stranding. There is also some ill-defined cystic areas in the kidney. There is also urothelial thickening along the course of the right ureter and renal pelvis. Based on appearance this could be sequela of renal infection with pyelonephritis. If so, the cystic areas could represent early abscess formation or phlegmonous change. -Urology consulted, plan to repeat imagine in 72 if WBC does not mormalized or if he develops new fever.  Can consider IR involvement if drainable fluid collection is seen -Follow Urine culture.  -Start Fluconazole.  -Continue with IV ceftriaxone.   SIRS: Only presented with leukocytosis, transient tachycardia -Continue with IV fluids.   Hyponatremia: and pseudohyponatremia related to hyperglycemia.  Sodium correction by hyperglycemia at 133 -Continue with IV fluids.   Chronic left side parotid mass: Illustrated on today's ultrasound, no abscess.  Continue follow-up outpatient with ENT PRN  Ibuprofen.    Diabetes type 2, with hyperglycemia uncontrolled SSI Continue with Semglee 45 units, and meal coverage.   History of DVT: Change prophylactic Eliquis to subcu Lovenox in case require drainage of abscess      Estimated body mass index is 32.23 kg/m as calculated from the following:   Height as of this encounter: 5\' 8"  (1.727 m).   Weight as of this encounter: 96.2 kg.   DVT prophylaxis: Lovenox Code Status: Full code Family Communication:care discussed with patient.  Disposition Plan:  Status is: Inpatient Remains inpatient appropriate because: management of Infection    Consultants:  Urology   Procedures:  None  Antimicrobials:    Subjective: He report pain parotid area, swelling worse over 2 weeks.  Having pain flank and hip  Objective: Vitals:   05/31/23 1323 05/31/23 1500 05/31/23 1952 06/01/23 0246  BP: 136/74 132/72 121/62 131/60  Pulse: 95 (!) 102 (!) 108 84  Resp: 20 20 18 18   Temp: 99 F (37.2 C) 98.1 F (36.7 C) 98.4 F (36.9 C) 99.3 F (37.4 C)  TempSrc: Oral Oral  Oral  SpO2: 99% 100% 97% 95%  Weight:      Height:        Intake/Output Summary (Last 24 hours) at 06/01/2023 0736 Last data filed at 06/01/2023 0645 Gross per 24 hour  Intake 1106.67 ml  Output 1450 ml  Net -343.33 ml   Filed Weights   05/31/23 0643  Weight: 96.2 kg    Examination:  General exam: Appears calm and comfortable  Respiratory system: Clear to auscultation. Respiratory effort normal. Cardiovascular system: S1 & S2 heard, RRR. No JVD, murmurs, rubs, gallops or clicks. No pedal edema. Gastrointestinal system: Abdomen is nondistended, soft and nontender. No organomegaly or  masses felt. Normal bowel sounds heard. Central nervous system: Alert and oriented.  Extremities: Symmetric 5 x 5 power.   Data Reviewed: I have personally reviewed following labs and imaging studies  CBC: Recent Labs  Lab 05/31/23 0646  WBC 17.6*  NEUTROABS 14.1*  HGB  13.4  HCT 38.1*  MCV 88.4  PLT 378   Basic Metabolic Panel: Recent Labs  Lab 05/31/23 0646  NA 128*  K 3.5  CL 93*  CO2 25  GLUCOSE 414*  BUN 11  CREATININE 0.91  CALCIUM 8.4*   GFR: Estimated Creatinine Clearance: 93.4 mL/min (by C-G formula based on SCr of 0.91 mg/dL). Liver Function Tests: Recent Labs  Lab 05/31/23 0646  AST 12*  ALT 19  ALKPHOS 93  BILITOT 1.0  PROT 7.0  ALBUMIN 2.9*   No results for input(s): "LIPASE", "AMYLASE" in the last 168 hours. No results for input(s): "AMMONIA" in the last 168 hours. Coagulation Profile: No results for input(s): "INR", "PROTIME" in the last 168 hours. Cardiac Enzymes: No results for input(s): "CKTOTAL", "CKMB", "CKMBINDEX", "TROPONINI" in the last 168 hours. BNP (last 3 results) No results for input(s): "PROBNP" in the last 8760 hours. HbA1C: Recent Labs    05/31/23 0646  HGBA1C 12.6*   CBG: Recent Labs  Lab 05/31/23 1509 05/31/23 1903 05/31/23 2134  GLUCAP 353* 363* 261*   Lipid Profile: No results for input(s): "CHOL", "HDL", "LDLCALC", "TRIG", "CHOLHDL", "LDLDIRECT" in the last 72 hours. Thyroid Function Tests: No results for input(s): "TSH", "T4TOTAL", "FREET4", "T3FREE", "THYROIDAB" in the last 72 hours. Anemia Panel: No results for input(s): "VITAMINB12", "FOLATE", "FERRITIN", "TIBC", "IRON", "RETICCTPCT" in the last 72 hours. Sepsis Labs: Recent Labs  Lab 05/31/23 0646 05/31/23 0844  LATICACIDVEN 1.1 1.0    No results found for this or any previous visit (from the past 240 hour(s)).       Radiology Studies: US SOFT TISSUE HEAD & NECK (NON-THYROID)  Result Date: 05/31/2023 CLINICAL DATA:  Abscess of parotid masseteric region. EXAM: ULTRASOUND OF HEAD/NECK SOFT TISSUES TECHNIQUE: Ultrasound examination of the head and neck soft tissues was performed in the area of clinical concern. COMPARISON:  Neck CT 09/19/2022 FINDINGS: Hypoechoic masses in the left parotid which are numerous. The most  superficial appears solid with branching internal color Doppler flow, up to 2.9 cm in length. This lesion measures larger than the 19 x 12 mm size on prior neck CT. The largest lesion is in the deep parotid and measures up to 3.4 x 2.2 x 2.5 cm (per my measurements) and has less internal blood flow suggesting partially cystic characteristics, which was also seen on prior CT. The patient presents with fever. No clear parotid parenchymal edema or ductal dilatation. IMPRESSION: Multiple left parotid masses, the largest measuring nearly 4 cm with suggested cystic/cavitary component, features and size also seen on 03/02/2023 neck CT. No specific signs of abscess or parotiditis. Electronically Signed   By: Tiburcio Pea M.D.   On: 05/31/2023 12:34   CT ABDOMEN PELVIS W CONTRAST  Result Date: 05/31/2023 CLINICAL DATA:  Abdominal pain that is acute and nonlocalized. EXAM: CT ABDOMEN AND PELVIS WITH CONTRAST TECHNIQUE: Multidetector CT imaging of the abdomen and pelvis was performed using the standard protocol following bolus administration of intravenous contrast. RADIATION DOSE REDUCTION: This exam was performed according to the departmental dose-optimization program which includes automated exposure control, adjustment of the mA and/or kV according to patient size and/or use of iterative reconstruction technique. CONTRAST:  OMNIPAQUE IOHEXOL 300  MG/ML  SOLN COMPARISON:  CT 05/07/2019. FINDINGS: Lower chest: There are peripheral lung base interstitial areas of thickening. Please correlate for any known history including scarring or fibrotic change. No pleural effusion. Few punctate calcifications as well, possibly related to old granulomatous disease. Hepatobiliary: No focal liver abnormality is seen. No gallstones, gallbladder wall thickening, or biliary dilatation. Patent portal vein Pancreas: Unremarkable. No pancreatic ductal dilatation or surrounding inflammatory changes. Spleen: Normal in size without  focal abnormality. Adrenals/Urinary Tract: Adrenal glands are preserved. Left kidney has some mild perinephric stranding, nonspecific. There is a Bosniak 1 midportion cysts, similar to previous a Bosniak 2 lower pole tiny focus. No follow up is recommended of these lesions. No collecting system dilatation of the left with a normal course and caliber of the left ureter down to the bladder. Preserved contours of the urinary bladder. The right kidney has a severe perinephric stranding. There are areas of heterogeneous enhancement of the right kidney with wedge-shaped like areas as well as some more confluence lower density cystic like areas as well. Example lower pole series 2, image 54 measuring 15 mm, series 2, image 47 posteromedial measuring 18 mm. There is extrarenal pelvis of the right kidney with some urothelial thickening and a UPJ transition. More distal ureter is slightly ectatic with a urothelial thickening and adjacent stranding down to the bladder. Stomach/Bowel: On this non oral contrast exam large bowel has a scattered colonic stool and is nondilated. There is redundant sigmoid colon extending into the midabdomen. Few colonic diverticula. The appendix is not well seen but no pericecal stranding or fluid. Stomach and small bowel are nondilated. Vascular/Lymphatic: Normal caliber aorta and IVC with atherosclerotic changes. There are several small lymph nodes along the retroperitoneum which are not pathologic by size criteria but more numerous than usually seen and could be reactive related to the right renal findings. Reproductive: Preserved seminal vesicles. Enlarged prostate with some low-density areas inferiorly such as series 2, image 94. Please correlate with any prostate symptoms. Other: No free intra-air. Musculoskeletal: Scattered degenerative changes of the spine and pelvis. Transitional lumbosacral segment. IMPRESSION: Heterogeneous enhancement of the right kidney with severe adjacent perinephric  stranding. There is also some ill-defined cystic areas in the kidney. There is also urothelial thickening along the course of the right ureter and renal pelvis. Based on appearance this could be sequela of renal infection with pyelonephritis. If so, the cystic areas could represent early abscess formation or phlegmonous change. Recommend close follow-up. Enlarged prostate with some low-density almost cystic like area inferiorly. This is of uncertain etiology and significance. Please correlate with any prostate symptoms. Presumed multiple reactive retroperitoneal nodes. Diffuse colonic stool without obstruction.  Few colonic diverticula. Electronically Signed   By: Karen Kays M.D.   On: 05/31/2023 10:54        Scheduled Meds:  docusate sodium  100 mg Oral BID   enoxaparin (LOVENOX) injection  0.5 mg/kg Subcutaneous Q24H   insulin aspart  0-20 Units Subcutaneous TID WC   insulin aspart  0-5 Units Subcutaneous QHS   insulin aspart  14 Units Subcutaneous TID WC   insulin glargine-yfgn  45 Units Subcutaneous Daily   ondansetron  8 mg Oral TID   pantoprazole  40 mg Oral Daily   tamsulosin  0.4 mg Oral QPC supper   Continuous Infusions:  cefTRIAXone (ROCEPHIN)  IV       LOS: 1 day    Time spent: 35 minutes    [name], MD [facility]  If 7PM-7AM, please contact night-coverage [email or web address]  06/01/2023, 7:36 AM

## 2023-06-01 NOTE — TOC Progression Note (Signed)
Transition of Care [facility]) - Progression Note    Patient Details  Name: Michael Gill MRN: [id number] Date of Birth: 06/22/1961  [facility] CM/SW Contact  Truddie Hidden, RN Phone Number: 06/01/2023, 9:09 AM  Clinical Narrative:    TOC continuing to follow patient's progress throughout discharge planning.        Expected Discharge Plan and Services                                               Social Determinants of Health (SDOH) Interventions SDOH Screenings   Food Insecurity: No Food Insecurity (05/31/2023)  Housing: Low Risk  (05/31/2023)  Transportation Needs: No Transportation Needs (05/31/2023)  Utilities: Not At Risk (05/31/2023)  Tobacco Use: Low Risk  (05/31/2023)    Readmission Risk Interventions     No data to display

## 2023-06-01 NOTE — Plan of Care (Signed)

## 2023-06-01 NOTE — Inpatient Diabetes Management (Signed)
Inpatient Diabetes Program Recommendations  AACE/ADA: New Consensus Statement on Inpatient Glycemic Control (2015)  Target Ranges:  Prepandial:   less than 140 mg/dL      Peak postprandial:   less than 180 mg/dL (1-2 hours)      Critically ill patients:  140 - 180 mg/dL   Lab Results  Component Value Date   GLUCAP 200 (H) 06/01/2023   HGBA1C 12.6 (H) 05/31/2023    Diabetes history: DM2 Outpatient Diabetes medications:  Lantus 45 units BID Novolog 14 units TID Current orders for Inpatient glycemic control: Semglee 45 units every day Novolog 0-20 units TID and 0-5 units at bedtime Novolog 14 units TID  Spoke with patient at bedside regarding A1C of 12.6%.  He states his glucose has been up to the 300's over the last 2 weeks due to his parotid mass.  He has also been diagnosed with pyelonephritis since admission which could be contributing to his elevated glucose trends.  He checks his glucose with finger sticks at least 3 times a day.  He does not drink and caloric beverages unless his glucose is low.    Ordered the Living Well with Diabetes booklet.  He would also like out patient education.  Will recommend OP diabetes and education services at [facility].    Will continue to follow while inpatient.  Thank you, Dulce Sellar, MSN, CDCES Diabetes Coordinator Inpatient Diabetes Program [phone] (team pager from 8a-5p)

## 2023-06-01 NOTE — Consult Note (Signed)
Urology Consult  I have been asked to see the patient by Dr. Sunnie Nielsen, for evaluation and management of right pyelonephritis with early abscess.  Chief Complaint: Dysuria, hesitancy, RLQ pain  History of Present Illness: Michael Gill is a 62 y.o. year old male with PMH uncontrolled diabetes with A1c 12.6, OSA, CVA, CAD, PE on Eliquis, and chronic benign left parotid tumor who presented to the ED yesterday with reports of facial swelling and dysuria, hesitancy, and RLQ pain.  Admission labs notable for white count 17.6; creatinine 0.91 (at baseline); lactate 1.1; and UA with 21-50 RBCs/hpf, 50 WBC/hpf, WBC clumps, and budding yeast.  Urine culture pending, on antibiotics as below.  CTAP with contrast revealed heterogeneous enhancement of the right kidney with severe perinephric stranding and evidence of early abscess consistent with pyelonephritis.  He also has prostate enlargement with indeterminate cystlike area inferiorly and presumed retroactive retroperitoneal lymph nodes. No similar pelvic fluid collection seen on prior CT AP with contrast dated 05/07/2019.  A.m. labs today with decreased white count, 16.0, and decreased creatinine, 0.80.  He has been afebrile, VSS.  He is accompanied today by his wife at the bedside.  He reports dysuria onset about 9 days ago.  He has a history of occasional UTI that is typically limited to the bladder.  These are managed by his PCP.  He has never seen urology before. He reports no perineal discomfort or fullness.  Anti-infectives (From admission, onward)    Start     Dose/Rate Route Frequency Ordered Stop   06/01/23 1000  cefTRIAXone (ROCEPHIN) 2 g in sodium chloride 0.9 % 100 mL IVPB        2 g 200 mL/hr over 30 Minutes Intravenous Every 24 hours 05/31/23 1320     05/31/23 0900  cefTRIAXone (ROCEPHIN) 2 g in sodium chloride 0.9 % 100 mL IVPB        2 g 200 mL/hr over 30 Minutes Intravenous  Once 05/31/23 0858 05/31/23 1008       Past Medical  History:  Diagnosis Date   Asthma    B12 deficiency    in the past   CAD (coronary artery disease)    s/p MI   Colon cancer (HCC) 1990   Complication of anesthesia    long time to wake up from colonoscopy.   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   Seizure (HCC)    last episode 08/2017. has no warning, will just happen. 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    difficulty forming words at times   Stroke [facility]) 2012, 03/2018   residual tremor left hand, facial droop   Tremors of nervous system    specifically left  hand, since stroke    Past Surgical History:  Procedure Laterality Date   APPENDECTOMY  1971   CARDIAC CATHETERIZATION Left 10/12/2015   Procedure: Left Heart Cath and Coronary Angiography;  Surgeon: Alwyn Pea, MD;  Location: [facility] INVASIVE CV LAB;  Service: Cardiovascular;  Laterality: Left;   TONSILLECTOMY      Home Medications:  Current Meds  Medication Sig   acetaminophen (TYLENOL) 500 MG tablet Take 500 mg by mouth every 6 (six) hours as needed for moderate pain.   albuterol (PROVENTIL HFA;VENTOLIN HFA) 108 (90 Base) MCG/ACT inhaler Inhale 1-2 puffs into the lungs every 4 (four) hours as needed for wheezing or shortness of breath.   bismuth subsalicylate (PEPTO BISMOL) 262 MG chewable  tablet Chew 524 mg by mouth as needed for indigestion or diarrhea or loose stools.    cetirizine (ZYRTEC) 10 MG tablet Take 10 mg by mouth daily.   Dextromethorphan-guaiFENesin 10-100 MG/5ML liquid Take 10 mLs by mouth every 6 (six) hours as needed.   docusate sodium (COLACE) 100 MG capsule Take 100 mg by mouth 2 (two) times daily.   ELIQUIS 2.5 MG TABS tablet TAKE (1) TABLET BY MOUTH TWICE DAILY.   fluticasone (FLONASE) 50 MCG/ACT nasal spray Place 1 spray into both nostrils daily as needed for allergies or rhinitis.   insulin aspart (NOVOLOG) 100 UNIT/ML injection Inject 14 Units into the skin 3 (three) times daily with meals.   insulin glargine (LANTUS) 100 UNIT/ML injection Inject 45 Units into the skin 2 (two) times daily.   meclizine (ANTIVERT) 25 MG tablet Take 25 mg by mouth 3 (three) times daily as needed for dizziness.   omeprazole (PRILOSEC) 40 MG capsule Take 40 mg by mouth daily as needed (GERD symptoms).    [DISCONTINUED] NOVOLOG FLEXPEN 100 UNIT/ML FlexPen Inject 15 Units into the skin 3 (three) times daily with meals.    Allergies:  Allergies  Allergen Reactions   Shellfish Allergy Hives    Swelling of throat and mouth. Betadine is NOT a problem   Codeine Other (See Comments)    Reaction:  Hallucinations    Sulfa Antibiotics Hives    Family History  Problem Relation Age of Onset   Alzheimer's disease Mother    Congestive Heart Failure Father    Colon cancer Brother     Social History:  reports that he has never smoked. He has never used smokeless tobacco. He reports that he does not drink alcohol and does not use drugs.  ROS: A complete review of systems was performed.  All systems are negative except for pertinent findings as noted.  Physical Exam:  Vital signs in last 24 hours: Temp:  [98.1 F (36.7 C)-99.3 F (37.4 C)] 98.5 F (36.9 C) (11/01 1205) Pulse Rate:  [84-108] 88 (11/01 1205) Resp:  [18-20] 18 (11/01 1205) BP: (112-136)/(60-74) 112/62 (11/01  1205) SpO2:  [91 %-100 %] 91 % (11/01 1205) Constitutional:  Alert and oriented, no acute distress HEENT: [place] AT, moist mucus membranes Cardiovascular: No clubbing, cyanosis, or edema Respiratory: Normal respiratory effort Skin: No rashes, bruises or suspicious lesions Neurologic: Grossly intact, no focal deficits, moving all 4 extremities Psychiatric: Normal mood and affect  Laboratory Data:  Recent Labs    05/31/23 0646 06/01/23 0634  WBC 17.6* 16.0*  HGB 13.4 12.8*  HCT 38.1* 37.5*   Recent Labs    05/31/23 0646 06/01/23 0634  NA 128* 131*  K 3.5 3.5  CL 93* 96*  CO2 25 25  GLUCOSE  414* 231*  BUN 11 13  CREATININE 0.91 0.80  CALCIUM 8.4* 8.1*   Urinalysis    Component Value Date/Time   COLORURINE YELLOW (A) 05/31/2023 0646   APPEARANCEUR CLOUDY (A) 05/31/2023 0646   APPEARANCEUR Hazy 11/01/2014 1413   LABSPEC 1.027 05/31/2023 0646   LABSPEC 1.012 11/01/2014 1413   PHURINE 6.0 05/31/2023 0646   GLUCOSEU >=500 (A) 05/31/2023 0646   GLUCOSEU 150 mg/dL 14/78/2956 2130   HGBUR LARGE (A) 05/31/2023 0646   BILIRUBINUR NEGATIVE 05/31/2023 0646   BILIRUBINUR Negative 11/01/2014 1413   KETONESUR 5 (A) 05/31/2023 0646   PROTEINUR NEGATIVE 05/31/2023 0646   NITRITE NEGATIVE 05/31/2023 0646   LEUKOCYTESUR LARGE (A) 05/31/2023 0646   LEUKOCYTESUR Negative 11/01/2014 1413   Results for orders placed or performed during the hospital encounter of 02/25/19  SARS Coronavirus 2 (CEPHEID- Performed in [facility] hospital lab), Hosp Order     Status: Abnormal   Collection Time: 02/25/19  3:27 PM   Specimen: Nasopharyngeal Swab  Result Value Ref Range Status   SARS Coronavirus 2 POSITIVE (A) NEGATIVE Final    Comment: RESULT CALLED TO, READ BACK BY AND VERIFIED WITH: AMBER PAYNE AT 1651 ON 02/25/2019 [facility]. (NOTE) If result is NEGATIVE SARS-CoV-2 target nucleic acids are NOT DETECTED. The SARS-CoV-2 RNA is generally detectable in upper and lower  respiratory specimens during  the acute phase of infection. The lowest  concentration of SARS-CoV-2 viral copies this assay can detect is 250  copies / mL. A negative result does not preclude SARS-CoV-2 infection  and should not be used as the sole basis for treatment or other  patient management decisions.  A negative result may occur with  improper specimen collection / handling, submission of specimen other  than nasopharyngeal swab, presence of viral mutation(s) within the  areas targeted by this assay, and inadequate number of viral copies  (<250 copies / mL). A negative result must be combined with clinical  observations, patient history, and epidemiological information. If result is POSITIVE SARS-CoV-2 target nucleic acids are DETECTED.  The SARS-CoV-2 RNA is generally detectable in upper and lower  respiratory specimens during the acute phase of infection.  Positive  results are indicative of active infection with SARS-CoV-2.  Clinical  correlation with patient history and other diagnostic information is  necessary to determine patient infection status.  Positive results do  not rule out bacterial infection or co-infection with other viruses. If result is PRESUMPTIVE POSTIVE SARS-CoV-2 nucleic acids MAY BE PRESENT.   A presumptive positive result was obtained on the submitted specimen  and confirmed on repeat testing.  While 2019 novel coronavirus  (SARS-CoV-2) nucleic acids may be present in the submitted sample  additional confirmatory testing may be necessary for epidemiological  and / or clinical management purposes  to differentiate between  SARS-CoV-2 and other Sarbecovirus currently known to infect humans.  If clinically indicated additional testing with an alternate test  methodology [phone])  is advised. The SARS-CoV-2 RNA is generally  detectable in upper and lower respiratory specimens during the acute  phase of infection. The expected result is Negative. Fact Sheet for Patients:   BoilerBrush.com.cy Fact Sheet for Healthcare Providers: https://pope.com/ This test is not yet approved or cleared by the [place] FDA and has been authorized for detection and/or diagnosis of SARS-CoV-2 by FDA under an Emergency Use Authorization (EUA).  This EUA will remain in effect (meaning this test can be used) for the duration of the COVID-19 declaration under Section 564(b)(1)  of the Act, 21 U.S.C. section 360bbb-3(b)(1), unless the authorization is terminated or revoked sooner. Performed at [facility], [address] [place], [place] [address]   MRSA PCR Screening     Status: None   Collection Time: 02/26/19  3:38 AM   Specimen: Nasal Mucosa; Nasopharyngeal  Result Value Ref Range Status   MRSA by PCR NEGATIVE NEGATIVE Final    Comment:        The GeneXpert MRSA Assay (FDA approved for NASAL specimens only), is one component of a comprehensive MRSA colonization surveillance program. It is not intended to diagnose MRSA infection nor to guide or monitor treatment for MRSA infections. Performed at [facility], [address] [place], [place] [address]     Radiologic Imaging: US SOFT TISSUE HEAD & NECK (NON-THYROID)  Result Date: 05/31/2023 CLINICAL DATA:  Abscess of parotid masseteric region. EXAM: ULTRASOUND OF HEAD/NECK SOFT TISSUES TECHNIQUE: Ultrasound examination of the head and neck soft tissues was performed in the area of clinical concern. COMPARISON:  Neck CT 09/19/2022 FINDINGS: Hypoechoic masses in the left parotid which are numerous. The most superficial appears solid with branching internal color Doppler flow, up to 2.9 cm in length. This lesion measures larger than the 19 x 12 mm size on prior neck CT. The largest lesion is in the deep parotid and measures up to 3.4 x 2.2 x 2.5 cm (per my measurements) and has less internal blood flow suggesting partially cystic characteristics, which  was also seen on prior CT. The patient presents with fever. No clear parotid parenchymal edema or ductal dilatation. IMPRESSION: Multiple left parotid masses, the largest measuring nearly 4 cm with suggested cystic/cavitary component, features and size also seen on 03/02/2023 neck CT. No specific signs of abscess or parotiditis. Electronically Signed   By: Tiburcio Pea M.D.   On: 05/31/2023 12:34   CT ABDOMEN PELVIS W CONTRAST  Result Date: 05/31/2023 CLINICAL DATA:  Abdominal pain that is acute and nonlocalized. EXAM: CT ABDOMEN AND PELVIS WITH CONTRAST TECHNIQUE: Multidetector CT imaging of the abdomen and pelvis was performed using the standard protocol following bolus administration of intravenous contrast. RADIATION DOSE REDUCTION: This exam was performed according to the departmental dose-optimization program which includes automated exposure control, adjustment of the mA and/or kV according to patient size and/or use of iterative reconstruction technique. CONTRAST:  OMNIPAQUE IOHEXOL 300 MG/ML  SOLN COMPARISON:  CT 05/07/2019. FINDINGS: Lower chest: There are peripheral lung base interstitial areas of thickening. Please correlate for any known history including scarring or fibrotic change. No pleural effusion. Few punctate calcifications as well, possibly related to old granulomatous disease. Hepatobiliary: No focal liver abnormality is seen. No gallstones, gallbladder wall thickening, or biliary dilatation. Patent portal vein Pancreas: Unremarkable. No pancreatic ductal dilatation or surrounding inflammatory changes. Spleen: Normal in size without focal abnormality. Adrenals/Urinary Tract: Adrenal glands are preserved. Left kidney has some mild perinephric stranding, nonspecific. There is a Bosniak 1 midportion cysts, similar to previous a Bosniak 2 lower pole tiny focus. No follow up is recommended of these lesions. No collecting system dilatation of the left with a normal course and caliber  of the left ureter down to the bladder. Preserved contours of the urinary bladder. The right kidney has a severe perinephric stranding. There are areas of heterogeneous enhancement of the right kidney with wedge-shaped like areas as well as some more confluence lower density cystic like areas as well. Example lower pole series 2, image 54 measuring 15 mm, series  2, image 47 posteromedial measuring 18 mm. There is extrarenal pelvis of the right kidney with some urothelial thickening and a UPJ transition. More distal ureter is slightly ectatic with a urothelial thickening and adjacent stranding down to the bladder. Stomach/Bowel: On this non oral contrast exam large bowel has a scattered colonic stool and is nondilated. There is redundant sigmoid colon extending into the midabdomen. Few colonic diverticula. The appendix is not well seen but no pericecal stranding or fluid. Stomach and small bowel are nondilated. Vascular/Lymphatic: Normal caliber aorta and IVC with atherosclerotic changes. There are several small lymph nodes along the retroperitoneum which are not pathologic by size criteria but more numerous than usually seen and could be reactive related to the right renal findings. Reproductive: Preserved seminal vesicles. Enlarged prostate with some low-density areas inferiorly such as series 2, image 94. Please correlate with any prostate symptoms. Other: No free intra-air. Musculoskeletal: Scattered degenerative changes of the spine and pelvis. Transitional lumbosacral segment. IMPRESSION: Heterogeneous enhancement of the right kidney with severe adjacent perinephric stranding. There is also some ill-defined cystic areas in the kidney. There is also urothelial thickening along the course of the right ureter and renal pelvis. Based on appearance this could be sequela of renal infection with pyelonephritis. If so, the cystic areas could represent early abscess formation or phlegmonous change. Recommend close  follow-up. Enlarged prostate with some low-density almost cystic like area inferiorly. This is of uncertain etiology and significance. Please correlate with any prostate symptoms. Presumed multiple reactive retroperitoneal nodes. Diffuse colonic stool without obstruction.  Few colonic diverticula. Electronically Signed   By: Karen Kays M.D.   On: 05/31/2023 10:54    Assessment & Plan:  62 year old comorbid male with uncontrolled diabetes admitted with right pyelonephritis and evidence of early abscess in the right kidney and possible inferiorly to the prostate.  No drainable fluid collection seen on admission CT.  We discussed that he will require IV antimicrobials for now with consideration of repeat contrast-enhanced imaging in 48 to 72 hours if his white count does not normalize or if he starts to spike new fevers.  He expressed understanding.  Recommendations: -Continue empiric antibiotics and follow cultures -Consider broadening antimicrobials to include antifungals and ID involvement given budding yeast on admission UA -Repeat CTAP with contrast in 48 to 72 hours if white count does not normalize or if he develops new fevers, consider IR involvement if drainable fluid collection is seen  Thank you for involving me in this patient's care, I will continue to follow along.  Carman Ching, PA-C 06/01/2023 12:25 PM

## 2023-06-01 NOTE — Consult Note (Addendum)
Michael, Gill [id number] [birth year] [name], MD  Reason for Consult: parotid mass  HPI: 62 year old male with lonstanding history of left sided parotid mass and facial nerve weakness.  Initially evaluated by [facility] in 2011 after masses on CT scan were found.  Unfortunately patient has been non-compliant with follow up and has intermittently been seen since.  Usually this occurs when the parotid gland swells and becomes painful.  Seen personally by myself in 2018 and then again in 2022 for similar issues as today.  Painful swelling of gland.  Over the years the gland has gradually increased in size and continues to be partially cystic and solid on imaging (multiple US/CT/MRI) over the last decade plus.  Repeat biopsy in 2022 showed benign oncocytic type cells and patient was referred to [facility] for evaluation for radical parotidectomy.  He unfortunately did not follow through with this appointment although I see a record of referring ENT reviewing imaging from 09/2022.  Patient reports similar pain to previous episodes.     Allergies:  Allergies  Allergen Reactions   Shellfish Allergy Hives    Swelling of throat and mouth. Betadine is NOT a problem   Codeine Other (See Comments)    Reaction:  Hallucinations    Sulfa Antibiotics Hives    ROS: Review of systems normal other than 12 systems except per HPI.  PMH:  Past Medical History:  Diagnosis Date   Asthma    B12 deficiency    in the past   CAD (coronary artery disease)    s/p MI   Colon cancer (HCC) 1990   Complication of anesthesia    long time to wake up from colonoscopy.   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   Seizure (HCC)    last episode 08/2017. has no warning, will just happen. 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    difficulty forming words at times   Stroke [facility]) 2012, 03/2018   residual tremor left hand, facial droop   Tremors of nervous system    specifically left hand, since stroke    FH:  Family History  Problem Relation Age of Onset   Alzheimer's disease Mother    Congestive Heart Failure Father    Colon cancer Brother     SH:  Social History   Socioeconomic History   Marital status: Married    Spouse name: Lupita Leash   Number of children: Not on file   Years of education: Not on file   Highest education level: Not on file  Occupational History   Occupation: retired    Comment: disabled  Tobacco Use   Smoking status: Never   Smokeless tobacco: Never  Vaping  Use   Vaping status: Never Used  Substance and Sexual Activity   Alcohol use: No   Drug use: No   Sexual activity: Not on file  Other Topics Concern   Not on file  Social History Narrative   Not working, disabled. Some deficits d/t prior stroke. History of seizures, MI and TIA   Wife is very supportive   Social Determinants of Corporate investment banker Strain: Not on file  Food Insecurity: No Food Insecurity (05/31/2023)   Hunger Vital Sign    Worried About Running Out of Food in the Last Year: Never true    Ran Out of Food in the Last Year: Never true  Transportation Needs: No Transportation Needs (05/31/2023)   PRAPARE - Administrator, Civil Service (Medical): No    Lack of Transportation (Non-Medical): No  Physical  Activity: Not on file  Stress: Not on file  Social Connections: Not on file  Intimate Partner Violence: Not At Risk (05/31/2023)   Humiliation, Afraid, Rape, and Kick questionnaire    Fear of Current or Ex-Partner: No    Emotionally Abused: No    Physically Abused: No    Sexually Abused: No    PSH:  Past Surgical History:  Procedure Laterality Date   APPENDECTOMY  1971   CARDIAC CATHETERIZATION Left 10/12/2015   Procedure: Left Heart Cath and Coronary Angiography;  Surgeon: Alwyn Pea, MD;  Location: [facility] INVASIVE CV LAB;  Service: Cardiovascular;  Laterality: Left;   TONSILLECTOMY      Physical  Exam:  GEN-  supine in bed drinking liquid, NAD NEURO-  CNVII with weakness throughout all branches on left; other CN 2-12 grossly intact and symmetric. EARS-  external ears clear NOSE- clear anteriorly OC/OP-  weakened left buccal and marginal mandibular branch with oral incompetence NECK-  Large left sided tender 4cmx 3cm parotid mass some areas appear cystic with others solid.  No induration.  No redness or erythema RESP- unlabored  [place]-  Multiple left parotid masses, the largest measuring nearly 4 cm with suggested cystic/cavitary component, features and size also seen on 03/02/2023 neck CT. No specific signs of abscess or parotiditis.  CT neck 03/2023-  Large heterogeneous soft tissue density mass in the left parotid gland measuring up to 3.8 cm, which was likely previously biopsied on 12/22/20 with benign result. Compared to prior exam, this lesion now appears more centrally hypodense. This finding is nonspecific. There is no evidence of surrounding soft tissue stranding to definitively suggest infection, but given central hypodensity, infection is a differential consideration. A parotid neoplasm remains a differential consideration and rebiopsy should be considered.   A/P: Left Parotid Oncocytic neoplasm  Plan:  Gradual enlargement of left sided parotid mass since 2011  and left sided facial nerve palsy.  Previous biopsy x2 and previous recommendation for removal over the years but unfortunately patient has been non-compliant and only is seen when swelling occurs.  Previously has responded to steroids when swells significantly and causes pain.  Already on Rocephin for pyelonephritis.  Could consider repeat US guided FNA that could be therapeutic for reduction in size of cystic components.  Recommend repeat referral to [facility] Dr. Roma Schanz for consideration of a radical parotidectomy once pyelonephritis is controlled.  Defer steroid to primary care regarding risk of increased CBGs in the past with steroid use.  **Discussed with Dr. Sunnie Nielsen who is fine with proceeding with 6mg  of IV Decadron and seeing how he responds to it with pain/swelling  but also impact on his CBG levels   Bud Face 06/01/2023 5:02 PM

## 2023-06-02 ENCOUNTER — Inpatient Hospital Stay: Payer: Medicare HMO

## 2023-06-02 DIAGNOSIS — F446 Conversion disorder with sensory symptom or deficit: Secondary | ICD-10-CM

## 2023-06-02 DIAGNOSIS — N12 Tubulo-interstitial nephritis, not specified as acute or chronic: Secondary | ICD-10-CM | POA: Diagnosis not present

## 2023-06-02 LAB — CBC
HCT: 35.5 % — ABNORMAL LOW (ref 39.0–52.0)
Hemoglobin: 12.1 g/dL — ABNORMAL LOW (ref 13.0–17.0)
MCH: 31.1 pg (ref 26.0–34.0)
MCHC: 34.1 g/dL (ref 30.0–36.0)
MCV: 91.3 fL (ref 80.0–100.0)
Platelets: 361 10*3/uL (ref 150–400)
RBC: 3.89 MIL/uL — ABNORMAL LOW (ref 4.22–5.81)
RDW: 12.2 % (ref 11.5–15.5)
WBC: 12.3 10*3/uL — ABNORMAL HIGH (ref 4.0–10.5)
nRBC: 0 % (ref 0.0–0.2)

## 2023-06-02 LAB — BASIC METABOLIC PANEL
Anion gap: 10 (ref 5–15)
BUN: 22 mg/dL (ref 8–23)
CO2: 23 mmol/L (ref 22–32)
Calcium: 8.2 mg/dL — ABNORMAL LOW (ref 8.9–10.3)
Chloride: 98 mmol/L (ref 98–111)
Creatinine, Ser: 0.91 mg/dL (ref 0.61–1.24)
GFR, Estimated: >60 mL/min (ref >60)
Glucose, Bld: 415 mg/dL — ABNORMAL HIGH (ref 70–99)
Potassium: 4.3 mmol/L (ref 3.5–5.1)
Sodium: 131 mmol/L — ABNORMAL LOW (ref 135–145)

## 2023-06-02 LAB — AMMONIA: Ammonia: <10 umol/L (ref 9–35)

## 2023-06-02 LAB — TROPONIN I (HIGH SENSITIVITY)
Troponin I (High Sensitivity): 6 ng/L (ref <18)
Troponin I (High Sensitivity): 8 ng/L (ref <18)

## 2023-06-02 LAB — URINE CULTURE: Culture: >=100000 — AB

## 2023-06-02 LAB — GLUCOSE, CAPILLARY
Glucose-Capillary: 403 mg/dL — ABNORMAL HIGH (ref 70–99)
Glucose-Capillary: 349 mg/dL — ABNORMAL HIGH (ref 70–99)
Glucose-Capillary: 362 mg/dL — ABNORMAL HIGH (ref 70–99)
Glucose-Capillary: 268 mg/dL — ABNORMAL HIGH (ref 70–99)
Glucose-Capillary: 219 mg/dL — ABNORMAL HIGH (ref 70–99)

## 2023-06-02 MED ORDER — DEXAMETHASONE SODIUM PHOSPHATE 10 MG/ML IJ SOLN
6.0000 mg | Freq: Once | INTRAMUSCULAR | Status: AC
  Administered 2023-06-02: 6 mg via INTRAVENOUS
  Filled 2023-06-02: qty 1

## 2023-06-02 MED ORDER — CEFAZOLIN SODIUM-DEXTROSE 2-4 GM/100ML-% IV SOLN
2.0000 g | Freq: Three times a day (TID) | INTRAVENOUS | Status: DC
  Administered 2023-06-02 – 2023-06-03 (×2): 2 g via INTRAVENOUS
  Filled 2023-06-02 (×5): qty 100

## 2023-06-02 MED ORDER — VANCOMYCIN HCL 2000 MG/400ML IV SOLN
2000.0000 mg | Freq: Once | INTRAVENOUS | Status: DC
  Filled 2023-06-02: qty 400

## 2023-06-02 MED ORDER — VANCOMYCIN HCL 1250 MG/250ML IV SOLN
1250.0000 mg | Freq: Two times a day (BID) | INTRAVENOUS | Status: DC
  Filled 2023-06-02: qty 250

## 2023-06-02 MED ORDER — INSULIN GLARGINE-YFGN 100 UNIT/ML ~~LOC~~ SOLN
30.0000 [IU] | Freq: Every day | SUBCUTANEOUS | Status: DC
  Administered 2023-06-02 – 2023-06-05 (×4): 30 [IU] via SUBCUTANEOUS
  Filled 2023-06-02 (×5): qty 0.3

## 2023-06-02 MED ORDER — SODIUM CHLORIDE 0.9 % IV SOLN: INTRAVENOUS | Status: AC

## 2023-06-02 MED ORDER — VANCOMYCIN HCL 1250 MG/250ML IV SOLN: 1250.0000 mg | Freq: Two times a day (BID) | INTRAVENOUS | Status: DC

## 2023-06-02 MED ORDER — INSULIN ASPART 100 UNIT/ML IJ SOLN: 15.0000 [IU] | Freq: Once | INTRAMUSCULAR | Status: DC

## 2023-06-02 MED ORDER — VANCOMYCIN HCL 1750 MG/350ML IV SOLN
1750.0000 mg | Freq: Once | INTRAVENOUS | Status: AC
  Administered 2023-06-02: 1750 mg via INTRAVENOUS
  Filled 2023-06-02: qty 350

## 2023-06-02 NOTE — Progress Notes (Signed)
Notified MD via text page of critical BGL 403 MD called back and advised to cover patient with 15 units sliding scale and 14 units standing which is different from ordered amount from [facility].

## 2023-06-02 NOTE — Progress Notes (Addendum)
Patient called me to the room stating that he felt like "he was going to pass out" I immediately obtained vitals and blood glucose and all results were stable, upon doing a focused neurological assessment patient had notable left sided facial droop, left arm, and left leg weakness. Patient was unable to lift left arm and left leg, and blank gaze. I called for a rapid response the response team came bedside MD was contacted and a code stroke was initiated. Patient was transported by myself and critical care nurse to STAT head CT followed by MRI. Critical care nurse accompanied patient in MRI and I returned to the unit to update wife bedside. All care team members are aware and following patients status at this time. Awaiting further interventions from care team.

## 2023-06-02 NOTE — Plan of Care (Signed)

## 2023-06-02 NOTE — Progress Notes (Signed)
Patient is off of the floor currently in MRI.

## 2023-06-02 NOTE — Significant Event (Signed)
Rapid Response Event Note   Reason for Call : called for new left sided weakness left sided facial droop...   Initial Focused Assessment: laying in bed, noted left parotid swelling, left facial droop noted, unable to move left arm and leg.       Interventions: Immediately called hospitalist, and activated code stroke, stat head ct (Dr Wilford Corner on code stroke cart assessing pt), after which we moved to stat MRI.   Plan of Care: MRI negative, cancelled code stroke per Dr Wilford Corner, pt taken back to room, neuro checks q 4 x 3    Event Summary:   MD Notified: Regalado 1615 Call Time:1612 Arrival [id number] End Time:1730  Rilynn Habel A, RN

## 2023-06-02 NOTE — Consult Note (Signed)
[facility] Telemedicine Consult   Requesting Provider: Dr Sunnie Nielsen Consult Participants: Dr. Marthe Patch, Telespecialist RN Misty   bedside RN Katie Location of the provider: Home   Location of the patient: [facility] (inpatient seen in CT over camera)   Patient was in CT, Dr. Pearlean Brownie who was covering tele service was on camera but not seen pt yet since he was on CT table. I jumped in and took over at 1626 hrs  This consult was provided via telemedicine with 2-way video and audio communication. The patient/family was informed that care would be provided in this way and agreed to receive care in this manner.   Chief Complaint: Left-sided weakness  HPI: 62 year old man past history significant for IDDM with insulin resistance, CAD, hypertension, DVT, chronic benign left parotid tumor, peripheral neuropathy, history of pseudoseizures and functional exam listed in the chart on chart review, admitted for pyelonephritis and a UTI with sudden onset of left-sided symptoms of weakness and stuttering speech.  Code stroke was activated with a last known well at 4 PM.  I saw him on the camera short while after 4 PM-detailed exam below. Reports sudden onset of symptoms.  He has a history of DVT for which she was on Eliquis which was changed to full dose Lovenox while inpatient. He received last dose of Lovenox last night around 10 PM.  Past Medical History:  Diagnosis Date   Asthma    B12 deficiency    in the past   CAD (coronary artery disease)    s/p MI   Colon cancer (HCC) 1990   Complication of anesthesia    long time to wake up from colonoscopy.   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   Seizure (HCC)    last episode 08/2017. has no warning, will just happen. 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    difficulty forming words at times   Stroke [facility]) 2012, 03/2018   residual tremor left hand, facial droop   Tremors of nervous system    specifically left hand, since stroke     Current Facility-Administered Medications:    0.9 %  sodium chloride infusion, , Intravenous, Continuous, Regalado, Belkys A, MD, Last Rate: 40 mL/hr at 06/02/23 1259, New Bag at 06/02/23 1259   albuterol (PROVENTIL) (2.5 MG/3ML) 0.083% nebulizer solution 3 mL, 3 mL, Inhalation, Q4H PRN, Mikey College T, MD   bismuth subsalicylate (PEPTO BISMOL) chewable tablet 524 mg, 524 mg, Oral, PRN, Mikey College T, MD   ceFAZolin (ANCEF) IVPB 2g/100 mL premix, 2 g, Intravenous, Q8H, Regalado, Belkys A, MD   cyclobenzaprine (FLEXERIL) tablet 5 mg, 5 mg, Oral, TID PRN, Mikey College T, MD, 5 mg at 06/01/23 [phone]  dexamethasone (DECADRON) injection 6 mg, 6 mg, Intravenous, Once, Regalado, Belkys A, MD   docusate sodium (COLACE) capsule 100 mg, 100 mg, Oral, BID, Mikey College T, MD, 100 mg at 06/02/23 0823   enoxaparin (LOVENOX) injection 47.5 mg, 0.5 mg/kg, Subcutaneous, Q24H, Lowella Bandy, RPH, 47.5 mg at 06/01/23 2159   fluconazole (DIFLUCAN) IVPB 200 mg, 200 mg, Intravenous, Q24H, Regalado, Belkys A, MD, Last Rate: 100 mL/hr at 06/01/23 1618, 200 mg at 06/01/23 1618   fluticasone (FLONASE) 50 MCG/ACT nasal spray 1 spray, 1 spray, Each Nare, Daily PRN, Mikey College T, MD   ibuprofen (ADVIL) tablet 400 mg, 400 mg, Oral, Q6H PRN, Mikey College T, MD, 400 mg  at 06/02/23 1250   insulin aspart (novoLOG) injection 0-20 Units, 0-20 Units, Subcutaneous, TID WC, Mikey College T, MD, 20 Units at 06/02/23 1230   insulin aspart (novoLOG) injection 0-5 Units, 0-5 Units, Subcutaneous, QHS, Mikey College T, MD, 3 Units at 05/31/23 2249   insulin aspart (novoLOG) injection 14 Units, 14 Units, Subcutaneous, TID WC, Mikey College T, MD, 14 Units at 06/02/23 1230   insulin aspart (novoLOG) injection 15 Units, 15 Units, Subcutaneous, Once, Regalado, Belkys A, MD   insulin glargine-yfgn (SEMGLEE) injection 30 Units, 30 Units, Subcutaneous, QHS, Regalado, Belkys A, MD   insulin glargine-yfgn (SEMGLEE) injection 45 Units, 45 Units, Subcutaneous, Daily, Mikey College T, MD, 45 Units at 06/02/23 0904   meclizine (ANTIVERT) tablet 25 mg, 25 mg, Oral, TID PRN, Mikey College T, MD   morphine (PF) 2 MG/ML injection 2 mg, 2 mg, Intravenous, Q2H PRN, Mikey College T, MD, 2 mg at 06/01/23 1453   ondansetron (ZOFRAN) tablet 4 mg, 4 mg, Oral, Q6H PRN **OR** ondansetron (ZOFRAN) injection 4 mg, 4 mg, Intravenous, Q6H PRN, Mikey College T, MD   ondansetron (ZOFRAN-ODT) disintegrating tablet 8 mg, 8 mg, Oral, TID, Mikey College T, MD, 8 mg at 05/31/23 2248   oxyCODONE-acetaminophen (PERCOCET/ROXICET) 5-325 MG per tablet 1 tablet, 1 tablet, Oral, Q6H PRN, Mikey College T, MD, 1 tablet at 06/02/23 0828   pantoprazole (PROTONIX) EC tablet 40 mg, 40 mg, Oral, Daily, Mikey College T, MD, 40 mg at 06/02/23 0823   tamsulosin (FLOMAX) capsule 0.4 mg, 0.4 mg, Oral, QPC supper, Mikey College T, MD, 0.4 mg at 06/01/23 1856    LKW: 4 PM IV thrombolysis given?: No, functional exam, on full anticoagulation IR Thrombectomy? No, exam not consistent with LVO Modified Rankin: Unable to reliably ascertain Time of teleneurologist evaluation: 1626  Exam: Vitals:   06/02/23 1602 06/02/23 1607  BP: 137/79 (!) 151/81  Pulse: 99 (!) 101  Resp:    Temp:    SpO2: 98% 98%    General: Awake alert in no  distress Neurological exam He is awake alert oriented x 3.  His speech has a started and not truly dysarthric but is stuttering. No aphasia Able to follow all commands Cranial nerves: Pupils equal round react light, extraocular movements intact visual fields appear full, volitional appearing left-sided facial weakness, diminished sensation on left face. Motor examination with what appears to be a very effort dependent left upper and lower extremity weakness.  Right side. Sensation diminished on the left Difficult to assess coordination on the left due t nonparticipation.   NIHSS 1A: Level of Consciousness - 0 1B: Ask Month and Age - 0 1C: 'Blink Eyes' & 'Squeeze Hands' - 0 2: Test Horizontal Extraocular Movements - 0 3: Test Visual Fields - 0 4: Test Facial Palsy - 1  5A: Test Left Arm Motor Drift - 3 5B: Test Right Arm Motor Drift - 0 6A: Test Left Leg Motor Drift - 3 6B: Test Right Leg Motor Drift - 0 7: Test Limb Ataxia - 0 8: Test Sensation - 2 9: Test Language/Aphasia- 0 10: Test Dysarthria - 1 11: Test Extinction/Inattention - 0 NIHSS score: 10   Imaging Reviewed: CT head-aspects 10.  Labs reviewed in epic and pertinent values follow: CBC    Component Value Date/Time   WBC 12.3 (H) 06/02/2023 0344   RBC 3.89 (L) 06/02/2023 0344   HGB 12.1 (L) 06/02/2023 0344   HGB 10.7 (L) 11/20/2014 1232   HCT 35.5 (L) 06/02/2023 0344   HCT 32.3 (L) 11/20/2014 1232   PLT 361 06/02/2023 0344   PLT 248 11/20/2014 1232   MCV 91.3 06/02/2023 0344   MCV 92 11/20/2014 1232   MCH 31.1 06/02/2023 0344   MCHC 34.1 06/02/2023 0344   RDW 12.2 06/02/2023 0344   RDW 15.9 (H) 11/20/2014 1232   LYMPHSABS 1.5 05/31/2023 0646   LYMPHSABS 1.3 11/13/2014 1858   MONOABS 1.3 (H) 05/31/2023 0646   MONOABS 0.4 11/13/2014 1858   EOSABS 0.1 05/31/2023 0646   EOSABS 0.8 (H) 11/13/2014 1858   BASOSABS 0.1 05/31/2023 0646   BASOSABS 0.1 11/13/2014 1858   CMP     Component Value Date/Time   NA  131 (L) 06/02/2023 0344   NA 134 (L) 11/20/2014 1232   K 4.3 06/02/2023 0344   K 3.9 11/20/2014 1232   CL 98 06/02/2023 0344   CL 100 (L) 11/20/2014 1232   CO2 23 06/02/2023 0344   CO2 28 11/20/2014 1232   GLUCOSE 415 (H) 06/02/2023 0344   GLUCOSE 184 (H) 11/20/2014 1232   BUN 22 06/02/2023 0344   BUN 9 11/20/2014 1232   CREATININE 0.91 06/02/2023 0344   CREATININE 1.08 11/20/2014 1232   CALCIUM 8.2 (L) 06/02/2023 0344   CALCIUM 8.6 (L) 11/20/2014 1232   PROT 7.0 05/31/2023 0646   PROT 5.9 (L) 11/01/2014 1413   ALBUMIN 2.9 (L) 05/31/2023 0646   ALBUMIN 2.2 (L) 11/01/2014 1413   AST 12 (L) 05/31/2023 0646   AST 20 11/01/2014 1413   ALT 19 05/31/2023 0646   ALT 27 11/01/2014 1413   ALKPHOS 93 05/31/2023 0646   ALKPHOS 97 11/01/2014 1413   BILITOT 1.0 05/31/2023 0646   BILITOT 2.1 (H) 11/01/2014 1413   GFRNONAA >60 06/02/2023 0344   GFRNONAA >60 11/20/2014 1232   GFRAA >60 01/27/2020 1310   GFRAA >60 11/20/2014 1232     Assessment:  62 year old man with above past medical history admitted for pyonephritis with sudden onset of left-sided symptoms.  Exam has a lot of functional  findings raising suspicion for this being conversion disorder. Given his recent infection and past medical history, I would give him the benefit of the doubt and get a stat MRI to rule out an acute stroke. If the MRI shows a DWI change without flair, he still will be a candidate for TNKase. If the MRI is negative, no further inpatient workup will be needed  Recommendations:  Stat MRI.  Will follow  Addendum 1704 Stat MRI brain negative for acute stroke. Symptoms likely functional. No further inpatient neurological work up needed.  Plan discussed with Drs. Regalado and Sreenath from [facility]  -- Milon Dikes, MD Neurologist [facility] Pager: [phone]

## 2023-06-02 NOTE — Progress Notes (Signed)
PROGRESS NOTE    Michael Gill  ZOX:[id number] DOB: 20-Sep-1960 DOA: 05/31/2023 PCP: Oswaldo Conroy, MD   Brief Narrative: 62 year old with past medical history significant for IDDM with insulin resistance, CAD, hypertension, DVT, chronic benign left parotid tumor, chronic peripheral neuropathy presents with worsening right flank pain.  Symptoms started 4 days prior to admission.  Evaluation in the ED he was tachycardic, leukocytosis white blood cell 17 hyponatremia sodium 128.  CT abdomen and pelvis show anterior genius enhancement of the right kidney is with several active send the perinephric stranding implying sequela of renal infection with pyelonephritis.  UA compatible with UTI and hematuria.     Assessment & Plan:   Principal Problem:   Pyelonephritis Active Problems:   Acute pyelonephritis   BPH (benign prostatic hyperplasia)   1-Acute pyelonephritis CT abdomen and pelvis:Heterogeneous enhancement of the right kidney with severe adjacent perinephric stranding. There is also some ill-defined cystic areas in the kidney. There is also urothelial thickening along the course of the right ureter and renal pelvis. Based on appearance this could be sequela of renal infection with pyelonephritis. If so, the cystic areas could represent early abscess formation or phlegmonous change. -Urology consulted, plan to repeat imagine in 72 if WBC does not mormalized or if he develops new fever.  Can consider IR involvement if drainable fluid collection is seen -Follow Urine culture.  -Started  Fluconazole.  -Continue with IV ceftriaxone, added vancomycin. Urine growing Staph Aureus. . Check Blood culture.   SIRS: Only presented with leukocytosis, transient tachycardia -Treated  with IV fluids.   Hyponatremia: and pseudohyponatremia related to hyperglycemia.  Sodium correction by hyperglycemia at 133 -Continue with IV fluids.   Chronic left side parotid mass: Illustrated on today's  ultrasound, no abscess.  Continue follow-up outpatient with ENT PRN Ibuprofen.  He was having worsening facial pain and swelling. ENT consulted. Plan for IV steroids. Will repeat another dose. Pain improved.   Diabetes type 2, with hyperglycemia uncontrolled SSI Continue with Semglee 45 units, and meal coverage.  Will add Night semglee now he will be on steroids.   History of DVT: Change prophylactic Eliquis to subcu Lovenox in case require drainage of abscess      Estimated body mass index is 32.23 kg/m as calculated from the following:   Height as of this encounter: 5\' 8"  (1.727 m).   Weight as of this encounter: 96.2 kg.   DVT prophylaxis: Lovenox Code Status: Full code Family Communication:care discussed with patient.  Disposition Plan:  Status is: Inpatient Remains inpatient appropriate because: management of Infection    Consultants:  Urology   Procedures:  None  Antimicrobials:    Subjective: He report back pain better. Facial pain improved.  Objective: Vitals:   06/01/23 1205 06/01/23 2156 06/02/23 0402 06/02/23 0743  BP: 112/62 115/70 129/75 121/72  Pulse: 88 82 75 75  Resp: 18 16 16 16   Temp: 98.5 F (36.9 C) 97.9 F (36.6 C) 98.1 F (36.7 C) 97.6 F (36.4 C)  TempSrc: Oral Oral  Oral  SpO2: 91% 97% 96% 95%  Weight:      Height:        Intake/Output Summary (Last 24 hours) at 06/02/2023 1251 Last data filed at 06/02/2023 1233 Gross per 24 hour  Intake 1067.33 ml  Output 2675 ml  Net -1607.67 ml   Filed Weights   05/31/23 0643  Weight: 96.2 kg    Examination:  General exam: NAD Respiratory system: CTA Cardiovascular system: S 1,  S 2 RRR Gastrointestinal system: BS present, soft nt Central nervous system: Alert Extremities: no edema   Data Reviewed: I have personally reviewed following labs and imaging studies  CBC: Recent Labs  Lab 05/31/23 0646 06/01/23 0634 06/02/23 0344  WBC 17.6* 16.0* 12.3*  NEUTROABS 14.1*  --   --    HGB 13.4 12.8* 12.1*  HCT 38.1* 37.5* 35.5*  MCV 88.4 90.6 91.3  PLT 378 373 361   Basic Metabolic Panel: Recent Labs  Lab 05/31/23 0646 06/01/23 0634 06/02/23 0344  NA 128* 131* 131*  K 3.5 3.5 4.3  CL 93* 96* 98  CO2 25 25 23   GLUCOSE 414* 231* 415*  BUN 11 13 22   CREATININE 0.91 0.80 0.91  CALCIUM 8.4* 8.1* 8.2*   GFR: Estimated Creatinine Clearance: 93.4 mL/min (by C-G formula based on SCr of 0.91 mg/dL). Liver Function Tests: Recent Labs  Lab 05/31/23 0646  AST 12*  ALT 19  ALKPHOS 93  BILITOT 1.0  PROT 7.0  ALBUMIN 2.9*   No results for input(s): "LIPASE", "AMYLASE" in the last 168 hours. No results for input(s): "AMMONIA" in the last 168 hours. Coagulation Profile: No results for input(s): "INR", "PROTIME" in the last 168 hours. Cardiac Enzymes: No results for input(s): "CKTOTAL", "CKMB", "CKMBINDEX", "TROPONINI" in the last 168 hours. BNP (last 3 results) No results for input(s): "PROBNP" in the last 8760 hours. HbA1C: Recent Labs    05/31/23 0646  HGBA1C 12.6*   CBG: Recent Labs  Lab 06/01/23 1607 06/01/23 2152 06/02/23 0744 06/02/23 0923 06/02/23 1155  GLUCAP 134* 169* 403* 349* 362*   Lipid Profile: No results for input(s): "CHOL", "HDL", "LDLCALC", "TRIG", "CHOLHDL", "LDLDIRECT" in the last 72 hours. Thyroid Function Tests: No results for input(s): "TSH", "T4TOTAL", "FREET4", "T3FREE", "THYROIDAB" in the last 72 hours. Anemia Panel: No results for input(s): "VITAMINB12", "FOLATE", "FERRITIN", "TIBC", "IRON", "RETICCTPCT" in the last 72 hours. Sepsis Labs: Recent Labs  Lab 05/31/23 0646 05/31/23 0844  LATICACIDVEN 1.1 1.0    Recent Results (from the past 240 hour(s))  Urine Culture     Status: Abnormal   Collection Time: 05/31/23  6:46 AM   Specimen: Urine, Clean Catch  Result Value Ref Range Status   Specimen Description   Final    URINE, CLEAN CATCH Performed at [facility], [address], [place], [place]  [address]    Special Requests   Final    NONE Performed at [facility], [address] [place], [place] [address]    Culture >=100,000 COLONIES/mL STAPHYLOCOCCUS AUREUS (A)  Final   Report Status 06/02/2023 FINAL  Final   Organism ID, Bacteria STAPHYLOCOCCUS AUREUS (A)  Final      Susceptibility   Staphylococcus aureus - MIC*    CIPROFLOXACIN <=0.5 SENSITIVE Sensitive     GENTAMICIN <=0.5 SENSITIVE Sensitive     NITROFURANTOIN 32 SENSITIVE Sensitive     OXACILLIN 0.5 SENSITIVE Sensitive     TETRACYCLINE >=16 RESISTANT Resistant     VANCOMYCIN 1 SENSITIVE Sensitive     TRIMETH/SULFA <=10 SENSITIVE Sensitive     CLINDAMYCIN <=0.25 SENSITIVE Sensitive     RIFAMPIN <=0.5 SENSITIVE Sensitive     Inducible Clindamycin NEGATIVE Sensitive     LINEZOLID 2 SENSITIVE Sensitive     * >=100,000 COLONIES/mL STAPHYLOCOCCUS AUREUS         Radiology Studies: No results found.      Scheduled Meds:  dexamethasone (DECADRON) injection  6 mg Intravenous Once   docusate  sodium  100 mg Oral BID   enoxaparin (LOVENOX) injection  0.5 mg/kg Subcutaneous Q24H   insulin aspart  0-20 Units Subcutaneous TID WC   insulin aspart  0-5 Units Subcutaneous QHS   insulin aspart  14 Units Subcutaneous TID WC   insulin aspart  15 Units Subcutaneous Once   insulin glargine-yfgn  30 Units Subcutaneous QHS   insulin glargine-yfgn  45 Units Subcutaneous Daily   ondansetron  8 mg Oral TID   pantoprazole  40 mg Oral Daily   tamsulosin  0.4 mg Oral QPC supper   Continuous Infusions:  cefTRIAXone (ROCEPHIN)  IV 2 g (06/02/23 0824)   fluconazole (DIFLUCAN) IV 200 mg (06/01/23 1618)   vancomycin       LOS: 2 days    Time spent: 35 minutes    Chaia Ikard A Elasha Tess, MD [facility]   If 7PM-7AM, please contact night-coverage [email or web address]  06/02/2023, 12:51 PM

## 2023-06-02 NOTE — Consult Note (Addendum)
1618 Code stroke cart activated/ Patient in CT at this time 1621 Telespecialist paged [phone] Dr Pearlean Brownie on screen/ Dr Wilford Corner on screen at 1626(Dr Wilford Corner took call) NCCT results reported to Dr Pearlean Brownie by radiologist.  Pt sent from CT to MRI by Dr Wilford Corner.  Dr Wilford Corner dismissed telestroke RN at this time.

## 2023-06-02 NOTE — Plan of Care (Signed)

## 2023-06-02 NOTE — Progress Notes (Signed)
Subjective: Michael Gill is feeling better but still has some right flank pain.  His WBC count is improving and he is afebrile. Urine culture is growing staph.    ROS:  Review of Systems  Constitutional:  Negative for chills and fever.  Genitourinary:  Positive for flank pain.    Anti-infectives: Anti-infectives (From admission, onward)    Start     Dose/Rate Route Frequency Ordered Stop   06/02/23 2200  vancomycin (VANCOREADY) IVPB 1250 mg/250 mL        1,250 mg 166.7 mL/hr over 90 Minutes Intravenous Every 12 hours 06/02/23 0844     06/02/23 0945  vancomycin (VANCOREADY) IVPB 1750 mg/350 mL        1,750 mg 175 mL/hr over 120 Minutes Intravenous  Once 06/02/23 0848     06/02/23 0930  vancomycin (VANCOREADY) IVPB 1250 mg/250 mL  Status:  Discontinued        1,250 mg 166.7 mL/hr over 90 Minutes Intravenous Every 12 hours 06/02/23 0843 06/02/23 0844   06/02/23 0930  vancomycin (VANCOREADY) IVPB 2000 mg/400 mL  Status:  Discontinued        2,000 mg 200 mL/hr over 120 Minutes Intravenous  Once 06/02/23 0844 06/02/23 0848   06/01/23 1400  fluconazole (DIFLUCAN) IVPB 200 mg        200 mg 100 mL/hr over 60 Minutes Intravenous Every 24 hours 06/01/23 1311     06/01/23 1000  cefTRIAXone (ROCEPHIN) 2 g in sodium chloride 0.9 % 100 mL IVPB        2 g 200 mL/hr over 30 Minutes Intravenous Every 24 hours 05/31/23 1320     05/31/23 0900  cefTRIAXone (ROCEPHIN) 2 g in sodium chloride 0.9 % 100 mL IVPB        2 g 200 mL/hr over 30 Minutes Intravenous  Once 05/31/23 0858 05/31/23 1008       Current Facility-Administered Medications  Medication Dose Route Frequency Provider Last Rate Last Admin   albuterol (PROVENTIL) (2.5 MG/3ML) 0.083% nebulizer solution 3 mL  3 mL Inhalation Q4H PRN Mikey College T, MD       bismuth subsalicylate (PEPTO BISMOL) chewable tablet 524 mg  524 mg Oral PRN Mikey College T, MD       cefTRIAXone (ROCEPHIN) 2 g in sodium chloride 0.9 % 100 mL IVPB  2 g Intravenous Q24H  Mikey College T, MD 200 mL/hr at 06/02/23 0824 2 g at 06/02/23 0824   cyclobenzaprine (FLEXERIL) tablet 5 mg  5 mg Oral TID PRN Mikey College T, MD   5 mg at 06/01/23 0816   dexamethasone (DECADRON) injection 6 mg  6 mg Intravenous Once Regalado, Belkys A, MD       docusate sodium (COLACE) capsule 100 mg  100 mg Oral BID Mikey College T, MD   100 mg at 06/02/23 0823   enoxaparin (LOVENOX) injection 47.5 mg  0.5 mg/kg Subcutaneous Q24H Lowella Bandy, RPH   47.5 mg at 06/01/23 2159   fluconazole (DIFLUCAN) IVPB 200 mg  200 mg Intravenous Q24H Regalado, Belkys A, MD 100 mL/hr at 06/01/23 1618 200 mg at 06/01/23 1618   fluticasone (FLONASE) 50 MCG/ACT nasal spray 1 spray  1 spray Each Nare Daily PRN Mikey College T, MD       ibuprofen (ADVIL) tablet 400 mg  400 mg Oral Q6H PRN Mikey College T, MD   400 mg at 06/02/23 0404   insulin aspart (novoLOG) injection 0-20 Units  0-20 Units Subcutaneous TID WC Zhang,  Renae Fickle, MD   15 Units at 06/02/23 4098   insulin aspart (novoLOG) injection 0-5 Units  0-5 Units Subcutaneous QHS Mikey College T, MD   3 Units at 05/31/23 2249   insulin aspart (novoLOG) injection 14 Units  14 Units Subcutaneous TID WC Emeline General, MD   14 Units at 06/02/23 0821   insulin aspart (novoLOG) injection 15 Units  15 Units Subcutaneous Once Regalado, Belkys A, MD       insulin glargine-yfgn (SEMGLEE) injection 30 Units  30 Units Subcutaneous QHS Regalado, Belkys A, MD       insulin glargine-yfgn (SEMGLEE) injection 45 Units  45 Units Subcutaneous Daily Mikey College T, MD   45 Units at 06/02/23 0904   meclizine (ANTIVERT) tablet 25 mg  25 mg Oral TID PRN Mikey College T, MD       morphine (PF) 2 MG/ML injection 2 mg  2 mg Intravenous Q2H PRN Mikey College T, MD   2 mg at 06/01/23 1453   ondansetron (ZOFRAN) tablet 4 mg  4 mg Oral Q6H PRN Mikey College T, MD       Or   ondansetron [facility]) injection 4 mg  4 mg Intravenous Q6H PRN Mikey College T, MD       ondansetron (ZOFRAN-ODT) disintegrating tablet 8  mg  8 mg Oral TID Mikey College T, MD   8 mg at 05/31/23 2248   oxyCODONE-acetaminophen (PERCOCET/ROXICET) 5-325 MG per tablet 1 tablet  1 tablet Oral Q6H PRN Mikey College T, MD   1 tablet at 06/02/23 0828   pantoprazole (PROTONIX) EC tablet 40 mg  40 mg Oral Daily Mikey College T, MD   40 mg at 06/02/23 0823   tamsulosin (FLOMAX) capsule 0.4 mg  0.4 mg Oral QPC supper Mikey College T, MD   0.4 mg at 06/01/23 1856   vancomycin (VANCOREADY) IVPB 1250 mg/250 mL  1,250 mg Intravenous Q12H Sharen Hones, RPH       vancomycin (VANCOREADY) IVPB 1750 mg/350 mL  1,750 mg Intravenous Once Sharen Hones, RPH 175 mL/hr at 06/02/23 1029 1,750 mg at 06/02/23 1029     Objective: Vital signs in last 24 hours: Temp:  [97.6 F (36.4 C)-98.5 F (36.9 C)] 97.6 F (36.4 C) (11/02 0743) Pulse Rate:  [75-88] 75 (11/02 0743) Resp:  [16-18] 16 (11/02 0743) BP: (112-129)/(62-75) 121/72 (11/02 0743) SpO2:  [91 %-97 %] 95 % (11/02 0743)  Intake/Output from previous day: 11/01 0701 - 11/02 0700 In: 1667.3 [P.O.:1080; I.V.:587.3] Out: 2675 [Urine:2675] Intake/Output this shift: Total I/O In: -  Out: 900 [Urine:900]   Physical Exam Vitals reviewed.  Constitutional:      Appearance: Normal appearance.  Abdominal:     Palpations: Abdomen is soft.     Tenderness: There is right CVA tenderness.  Neurological:     Mental Status: He is alert.     Lab Results:  Recent Labs    06/01/23 0634 06/02/23 0344  WBC 16.0* 12.3*  HGB 12.8* 12.1*  HCT 37.5* 35.5*  PLT 373 361   BMET Recent Labs    06/01/23 0634 06/02/23 0344  NA 131* 131*  K 3.5 4.3  CL 96* 98  CO2 25 23  GLUCOSE 231* 415*  BUN 13 22  CREATININE 0.80 0.91  CALCIUM 8.1* 8.2*   PT/INR No results for input(s): "LABPROT", "INR" in the last 72 hours. ABG No results for input(s): "PHART", "HCO3" in the last 72 hours.  Invalid input(s): "PCO2", "PO2"  Studies/Results: No results found.   Assessment and Plan: Right  pyelonephritis with early abscess.  He is improving with a decline in the WBC and no fever.  Continue current care.       LOS: 2 days    Michael Gill 11/2/2024Patient ID: Michael Gill, male   DOB: 09-29-1960, 62 y.o.   MRN: [id number]

## 2023-06-03 ENCOUNTER — Inpatient Hospital Stay
Admit: 2023-06-03 | Discharge: 2023-06-03 | Disposition: A | Discharge status: Home / Self Care | Payer: Medicare HMO | Attending: Internal Medicine | Admitting: Internal Medicine

## 2023-06-03 DIAGNOSIS — R29818 Other symptoms and signs involving the nervous system: Secondary | ICD-10-CM

## 2023-06-03 DIAGNOSIS — N12 Tubulo-interstitial nephritis, not specified as acute or chronic: Secondary | ICD-10-CM | POA: Diagnosis not present

## 2023-06-03 LAB — ECHOCARDIOGRAM COMPLETE
AR max vel: 2.84 cm2
AV Area VTI: 2.82 cm2
AV Area mean vel: 2.84 cm2
AV Mean grad: 5.3 mm[Hg]
AV Peak grad: 9.7 mm[Hg]
Ao pk vel: 1.55 m/s
Area-P 1/2: 4.8 cm2
Height: 68 in
S' Lateral: 3.1 cm
Weight: 3392 [oz_av]

## 2023-06-03 LAB — BASIC METABOLIC PANEL
Anion gap: 8 (ref 5–15)
BUN: 19 mg/dL (ref 8–23)
CO2: 24 mmol/L (ref 22–32)
Calcium: 7.9 mg/dL — ABNORMAL LOW (ref 8.9–10.3)
Chloride: 102 mmol/L (ref 98–111)
Creatinine, Ser: 0.84 mg/dL (ref 0.61–1.24)
GFR, Estimated: >60 mL/min (ref >60)
Glucose, Bld: 371 mg/dL — ABNORMAL HIGH (ref 70–99)
Potassium: 3.9 mmol/L (ref 3.5–5.1)
Sodium: 134 mmol/L — ABNORMAL LOW (ref 135–145)

## 2023-06-03 LAB — CBC
HCT: 35.1 % — ABNORMAL LOW (ref 39.0–52.0)
Hemoglobin: 11.8 g/dL — ABNORMAL LOW (ref 13.0–17.0)
MCH: 30.4 pg (ref 26.0–34.0)
MCHC: 33.6 g/dL (ref 30.0–36.0)
MCV: 90.5 fL (ref 80.0–100.0)
Platelets: 384 10*3/uL (ref 150–400)
RBC: 3.88 MIL/uL — ABNORMAL LOW (ref 4.22–5.81)
RDW: 12.1 % (ref 11.5–15.5)
WBC: 13 10*3/uL — ABNORMAL HIGH (ref 4.0–10.5)
nRBC: 0 % (ref 0.0–0.2)

## 2023-06-03 LAB — VITAMIN B12: Vitamin B-12: 314 pg/mL (ref 180–914)

## 2023-06-03 LAB — GLUCOSE, CAPILLARY
Glucose-Capillary: 309 mg/dL — ABNORMAL HIGH (ref 70–99)
Glucose-Capillary: 287 mg/dL — ABNORMAL HIGH (ref 70–99)
Glucose-Capillary: 170 mg/dL — ABNORMAL HIGH (ref 70–99)
Glucose-Capillary: 138 mg/dL — ABNORMAL HIGH (ref 70–99)

## 2023-06-03 MED ORDER — POLYETHYLENE GLYCOL 3350 17 G PO PACK
17.0000 g | PACK | Freq: Two times a day (BID) | ORAL | Status: DC
  Administered 2023-06-03 – 2023-06-04 (×2): 17 g via ORAL
  Filled 2023-06-03 (×5): qty 1

## 2023-06-03 MED ORDER — VITAMIN B-12 1000 MCG PO TABS
500.0000 ug | ORAL_TABLET | Freq: Every day | ORAL | Status: DC
  Administered 2023-06-03 – 2023-06-06 (×4): 500 ug via ORAL
  Filled 2023-06-03 (×4): qty 1

## 2023-06-03 MED ORDER — PREDNISONE 20 MG PO TABS
20.0000 mg | ORAL_TABLET | Freq: Every day | ORAL | Status: AC
  Administered 2023-06-03 – 2023-06-04 (×2): 20 mg via ORAL
  Filled 2023-06-03: qty 1

## 2023-06-03 MED ORDER — SENNA 8.6 MG PO TABS
1.0000 | ORAL_TABLET | Freq: Every day | ORAL | Status: DC
  Administered 2023-06-03 – 2023-06-04 (×2): 8.6 mg via ORAL
  Filled 2023-06-03 (×3): qty 1

## 2023-06-03 MED ORDER — FLUCONAZOLE 100 MG PO TABS
200.0000 mg | ORAL_TABLET | Freq: Every day | ORAL | Status: DC
  Administered 2023-06-03 – 2023-06-04 (×2): 200 mg via ORAL
  Filled 2023-06-03 (×2): qty 2

## 2023-06-03 MED ORDER — CEFAZOLIN SODIUM-DEXTROSE 2-4 GM/100ML-% IV SOLN
2.0000 g | Freq: Three times a day (TID) | INTRAVENOUS | Status: DC
  Administered 2023-06-03 – 2023-06-06 (×9): 2 g via INTRAVENOUS
  Filled 2023-06-03 (×10): qty 100

## 2023-06-03 NOTE — Progress Notes (Signed)
PROGRESS NOTE    HALE CHALFIN  LKG:[id number] DOB: [birth year] DOA: 05/31/2023 PCP: Oswaldo Conroy, MD   Brief Narrative: 62 year old with past medical history significant for IDDM with insulin resistance, CAD, hypertension, DVT, chronic benign left parotid tumor, chronic peripheral neuropathy presents with worsening right flank pain.  Symptoms started 4 days prior to admission.  Evaluation in the ED he was tachycardic, leukocytosis white blood cell 17 hyponatremia sodium 128.  CT abdomen and pelvis show anterior genius enhancement of the right kidney is with several active send the perinephric stranding implying sequela of renal infection with pyelonephritis.  UA compatible with UTI and hematuria.     Assessment & Plan:   Principal Problem:   Pyelonephritis Active Problems:   Acute pyelonephritis   BPH (benign prostatic hyperplasia)   1-Acute pyelonephritis CT abdomen and pelvis:Heterogeneous enhancement of the right kidney with severe adjacent perinephric stranding. There is also some ill-defined cystic areas in the kidney. There is also urothelial thickening along the course of the right ureter and renal pelvis. Based on appearance this could be sequela of renal infection with pyelonephritis. If so, the cystic areas could represent early abscess formation or phlegmonous change. -Urology consulted, plan to repeat imagine in 72 if WBC does not mormalized or if he develops new fever.  Can consider IR involvement if drainable fluid collection is seen -Urine culture. Growing staph. Repeat culture -On  Fluconazole. Budding yeast  -Continue with IV ceftriaxone, added vancomycin. Urine growing Staph Aureus.  Blood culture. Negative. Check echo -Will consult ID tomorrow.   Left side weakness, Numbness and tingling.  Code stroke activated 11/02 Appreciate Dr Rosalita Levan evaluation.  CT , MRI negative Symptoms thought to be functional. Resolved.  No further neurology evaluation.   SIRS:  Only presented with leukocytosis, transient tachycardia -Treated  with IV fluids.   Hyponatremia: and pseudohyponatremia related to hyperglycemia.  Sodium correction by hyperglycemia at 133 -treated with IV fluids.   Chronic left side parotid mass: Illustrated on today's ultrasound, no abscess.  Continue follow-up outpatient with ENT PRN Ibuprofen.  He was having worsening facial pain and swelling. ENT consulted.  He has received 2 doses of IV decadron. Will start prednisone 20 mg for 2 days.   Diabetes type 2, with hyperglycemia uncontrolled SSI Continue with Semglee 45 units and 30 units hs, and meal coverage.    History of DVT: Change prophylactic Eliquis to subcu Lovenox in case require drainage of abscess  Constipation; start miralax.     Estimated body mass index is 32.23 kg/m as calculated from the following:   Height as of this encounter: 5\' 8"  (1.727 m).   Weight as of this encounter: 96.2 kg.   DVT prophylaxis: Lovenox Code Status: Full code Family Communication:care discussed with patient.  Disposition Plan:  Status is: Inpatient Remains inpatient appropriate because: management of Infection    Consultants:  Urology   Procedures:  None  Antimicrobials:    Subjective: He is back to baseline.  Report facial pain improving, down to 5 No BM in several days.   Objective: Vitals:   06/02/23 1602 06/02/23 1607 06/02/23 1957 06/03/23 0312  BP: 137/79 (!) 151/81 133/75 135/80  Pulse: 99 (!) 101 84 63  Resp:   18 18  Temp:   98 F (36.7 C) 97.6 F (36.4 C)  TempSrc:   Oral Oral  SpO2: 98% 98% 95% 95%  Weight:      Height:        Intake/Output Summary (Last  24 hours) at 06/03/2023 0732 Last data filed at 06/03/2023 0300 Gross per 24 hour  Intake 1720.67 ml  Output 2850 ml  Net -1129.33 ml   Filed Weights   05/31/23 0643  Weight: 96.2 kg    Examination:  General exam: NAD Respiratory system: CTA Cardiovascular system: S 1, S 2  RRR Gastrointestinal system: BS present, soft, nt Central nervous system: Alert, follows command Extremities: No edema   Data Reviewed: I have personally reviewed following labs and imaging studies  CBC: Recent Labs  Lab 05/31/23 0646 06/01/23 0634 06/02/23 0344  WBC 17.6* 16.0* 12.3*  NEUTROABS 14.1*  --   --   HGB 13.4 12.8* 12.1*  HCT 38.1* 37.5* 35.5*  MCV 88.4 90.6 91.3  PLT 378 373 361   Basic Metabolic Panel: Recent Labs  Lab 05/31/23 0646 06/01/23 0634 06/02/23 0344  NA 128* 131* 131*  K 3.5 3.5 4.3  CL 93* 96* 98  CO2 25 25 23   GLUCOSE 414* 231* 415*  BUN 11 13 22   CREATININE 0.91 0.80 0.91  CALCIUM 8.4* 8.1* 8.2*   GFR: Estimated Creatinine Clearance: 93.4 mL/min (by C-G formula based on SCr of 0.91 mg/dL). Liver Function Tests: Recent Labs  Lab 05/31/23 0646  AST 12*  ALT 19  ALKPHOS 93  BILITOT 1.0  PROT 7.0  ALBUMIN 2.9*   No results for input(s): "LIPASE", "AMYLASE" in the last 168 hours. Recent Labs  Lab 06/02/23 1837  AMMONIA <10   Coagulation Profile: No results for input(s): "INR", "PROTIME" in the last 168 hours. Cardiac Enzymes: No results for input(s): "CKTOTAL", "CKMB", "CKMBINDEX", "TROPONINI" in the last 168 hours. BNP (last 3 results) No results for input(s): "PROBNP" in the last 8760 hours. HbA1C: No results for input(s): "HGBA1C" in the last 72 hours.  CBG: Recent Labs  Lab 06/02/23 0744 06/02/23 0923 06/02/23 1155 06/02/23 1558 06/02/23 2108  GLUCAP 403* 349* 362* 268* 219*   Lipid Profile: No results for input(s): "CHOL", "HDL", "LDLCALC", "TRIG", "CHOLHDL", "LDLDIRECT" in the last 72 hours. Thyroid Function Tests: No results for input(s): "TSH", "T4TOTAL", "FREET4", "T3FREE", "THYROIDAB" in the last 72 hours. Anemia Panel: No results for input(s): "VITAMINB12", "FOLATE", "FERRITIN", "TIBC", "IRON", "RETICCTPCT" in the last 72 hours. Sepsis Labs: Recent Labs  Lab 05/31/23 0646 05/31/23 0844  LATICACIDVEN  1.1 1.0    Recent Results (from the past 240 hour(s))  Urine Culture     Status: Abnormal   Collection Time: 05/31/23  6:46 AM   Specimen: Urine, Clean Catch  Result Value Ref Range Status   Specimen Description   Final    URINE, CLEAN CATCH Performed at [facility], [address] [place], [place] [address]    Special Requests   Final    NONE Performed at [facility], [address] [place], [place] [address]    Culture >=100,000 COLONIES/mL STAPHYLOCOCCUS AUREUS (A)  Final   Report Status 06/02/2023 FINAL  Final   Organism ID, Bacteria STAPHYLOCOCCUS AUREUS (A)  Final      Susceptibility   Staphylococcus aureus - MIC*    CIPROFLOXACIN <=0.5 SENSITIVE Sensitive     GENTAMICIN <=0.5 SENSITIVE Sensitive     NITROFURANTOIN 32 SENSITIVE Sensitive     OXACILLIN 0.5 SENSITIVE Sensitive     TETRACYCLINE >=16 RESISTANT Resistant     VANCOMYCIN 1 SENSITIVE Sensitive     TRIMETH/SULFA <=10 SENSITIVE Sensitive     CLINDAMYCIN <=0.25 SENSITIVE Sensitive     RIFAMPIN <=0.5 SENSITIVE Sensitive  Inducible Clindamycin NEGATIVE Sensitive     LINEZOLID 2 SENSITIVE Sensitive     * >=100,000 COLONIES/mL STAPHYLOCOCCUS AUREUS  Culture, blood (Routine X 2) w Reflex to ID Panel     Status: None (Preliminary result)   Collection Time: 06/02/23  8:31 AM   Specimen: BLOOD RIGHT HAND  Result Value Ref Range Status   Specimen Description BLOOD RIGHT HAND  Final   Special Requests   Final    BOTTLES DRAWN AEROBIC AND ANAEROBIC Blood Culture results may not be optimal due to an excessive volume of blood received in culture bottles   Culture   Final    NO GROWTH < 24 HOURS Performed at [facility], [address], [place], [place] [address]    Report Status PENDING  Incomplete  Culture, blood (Routine X 2) w Reflex to ID Panel     Status: None (Preliminary result)   Collection Time: 06/02/23  8:37 AM   Specimen: BLOOD LEFT HAND  Result Value Ref Range Status    Specimen Description BLOOD LEFT HAND  Final   Special Requests   Final    BOTTLES DRAWN AEROBIC AND ANAEROBIC Blood Culture results may not be optimal due to an excessive volume of blood received in culture bottles   Culture   Final    NO GROWTH < 24 HOURS Performed at [facility], [address], [place], [place] [address]    Report Status PENDING  Incomplete         Radiology Studies: MR BRAIN WO CONTRAST  Result Date: 06/02/2023 CLINICAL DATA:  Left facial droop and extremity weakness EXAM: MRI HEAD WITHOUT CONTRAST TECHNIQUE: Multiplanar, multiecho pulse sequences of the brain and surrounding structures were obtained without intravenous contrast. COMPARISON:  Same-day CT head FINDINGS: A truncated protocol including axial and coronal DWI, axial FLAIR, and axial SWI sequences. Brain: There is no acute intracranial hemorrhage, extra-axial fluid collection, or acute infarct. Parenchymal volume is normal. The ventricles are normal in size. Parenchymal signal is normal on the provided sequences. There is no mass lesion.  There is no mass effect or midline shift. Vascular: Suboptimally assessed on the provided sequences. Skull and upper cervical spine: Suboptimally assessed on the provided sequences. Sinuses/Orbits: No acute finding. Other: The mastoid air cells and middle ear cavities are clear. IMPRESSION: No acute intracranial pathology. Electronically Signed   By: Lesia Hausen M.D.   On: 06/02/2023 17:17   CT HEAD CODE STROKE WO CONTRAST`  Result Date: 06/02/2023 CLINICAL DATA:  Code stroke. Left facial droop and upper and lower extremity weakness. EXAM: CT HEAD WITHOUT CONTRAST TECHNIQUE: Contiguous axial images were obtained from the base of the skull through the vertex without intravenous contrast. RADIATION DOSE REDUCTION: This exam was performed according to the departmental dose-optimization program which includes automated exposure control, adjustment of the mA and/or kV  according to patient size and/or use of iterative reconstruction technique. COMPARISON:  CT head 09/19/2022 FINDINGS: Brain: There is no acute intracranial hemorrhage, extra-axial fluid collection, or acute infarct. Parenchymal volume is normal. The ventricles are normal in size. Gray-white differentiation is preserved. The pituitary and suprasellar region are normal. There is no mass lesion. There is no mass effect or midline shift. Vascular: No hyperdense vessel or unexpected calcification. Skull: Normal. Negative for fracture or focal lesion. Sinuses/Orbits: The paranasal sinuses are clear. The globes and orbits are unremarkable. Other: Multiple left parotid masses are again seen, previously evaluated with biopsy, ultrasound, and CT neck. ASPECTS [facility] Stroke  Program Early CT Score) - Ganglionic level infarction (caudate, lentiform nuclei, internal capsule, insula, M1-M3 cortex): 7 - Supraganglionic infarction (M4-M6 cortex): 3 Total score (0-10 with 10 being normal): 10 IMPRESSION: 1. No acute intracranial pathology 2. ASPECTS is 10 Findings communicated via AMION to Dr Pearlean Brownie at 4:34 pm. Electronically Signed   By: Lesia Hausen M.D.   On: 06/02/2023 16:35        Scheduled Meds:  docusate sodium  100 mg Oral BID   enoxaparin (LOVENOX) injection  0.5 mg/kg Subcutaneous Q24H   insulin aspart  0-20 Units Subcutaneous TID WC   insulin aspart  0-5 Units Subcutaneous QHS   insulin aspart  14 Units Subcutaneous TID WC   insulin aspart  15 Units Subcutaneous Once   insulin glargine-yfgn  30 Units Subcutaneous QHS   insulin glargine-yfgn  45 Units Subcutaneous Daily   ondansetron  8 mg Oral TID   pantoprazole  40 mg Oral Daily   tamsulosin  0.4 mg Oral QPC supper   Continuous Infusions:  sodium chloride 40 mL/hr at 06/03/23 0602    ceFAZolin (ANCEF) IV 2 g (06/03/23 0603)   fluconazole (DIFLUCAN) IV 200 mg (06/02/23 1748)     LOS: 3 days    Time spent: 35 minutes    Zahir Eisenhour A Nolyn Eilert,  MD [facility]   If 7PM-7AM, please contact night-coverage [email or web address]  06/03/2023, 7:32 AM

## 2023-06-03 NOTE — Plan of Care (Signed)
  Problem: Education: Goal: Ability to describe self-care measures that may prevent or decrease complications (Diabetes Survival Skills Education) will improve Outcome: Progressing   Problem: Coping: Goal: Ability to adjust to condition or change in health will improve Outcome: Progressing   Problem: Fluid Volume: Goal: Ability to maintain a balanced intake and output will improve Outcome: Progressing   Problem: Health Behavior/Discharge Planning: Goal: Ability to identify and utilize available resources and services will improve Outcome: Progressing Goal: Ability to manage health-related needs will improve Outcome: Progressing   Problem: Metabolic: Goal: Ability to maintain appropriate glucose levels will improve Outcome: Progressing   Problem: Nutritional: Goal: Maintenance of adequate nutrition will improve Outcome: Progressing   Problem: Tissue Perfusion: Goal: Adequacy of tissue perfusion will improve Outcome: Progressing   Problem: Education: Goal: Knowledge of General Education information will improve Description: Including pain rating scale, medication(s)/side effects and non-pharmacologic comfort measures Outcome: Progressing   Problem: Health Behavior/Discharge Planning: Goal: Ability to manage health-related needs will improve Outcome: Progressing   Problem: Clinical Measurements: Goal: Ability to maintain clinical measurements within normal limits will improve Outcome: Progressing Goal: Will remain free from infection Outcome: Progressing Goal: Diagnostic test results will improve Outcome: Progressing   Problem: Activity: Goal: Risk for activity intolerance will decrease Outcome: Progressing   Problem: Nutrition: Goal: Adequate nutrition will be maintained Outcome: Progressing   Problem: Coping: Goal: Level of anxiety will decrease Outcome: Progressing   Problem: Elimination: Goal: Will not experience complications related to bowel  motility Outcome: Progressing Goal: Will not experience complications related to urinary retention Outcome: Progressing   Problem: Pain Management: Goal: General experience of comfort will improve Outcome: Progressing   Problem: Safety: Goal: Ability to remain free from injury will improve Outcome: Progressing

## 2023-06-03 NOTE — Progress Notes (Signed)
*  PRELIMINARY RESULTS* Echocardiogram 2D Echocardiogram has been performed.  Laddie Aquas 06/03/2023, 10:32 AM

## 2023-06-03 NOTE — Plan of Care (Signed)

## 2023-06-03 NOTE — Progress Notes (Signed)
Patient called this RN to the room stated he wanted to have BM,he needed assistance to the bathroom.Patient reminded about episode of weakness on day shift .Patient encouraged to use bed side commode.Patient tolerated transfer from bed to bedside commode well. Assisted patient back to bed.No distress at this time.

## 2023-06-03 NOTE — Progress Notes (Signed)
Neurology Progress Note   S:// All left-sided weakness symptoms are resolved.    O:// Current vital signs: BP (!) 140/71 (BP Location: Right Arm)   Pulse 62   Temp 97.8 F (36.6 C)   Resp 18   Ht 5\' 8"  (1.727 m)   Wt 96.2 kg   SpO2 96%   BMI 32.23 kg/m  Vital signs in last 24 hours: Temp:  [97.6 F (36.4 C)-98 F (36.7 C)] 97.8 F (36.6 C) (11/03 0811) Pulse Rate:  [62-101] 62 (11/03 0811) Resp:  [18-19] 18 (11/03 0811) BP: (133-151)/(71-81) 140/71 (11/03 0811) SpO2:  [95 %-99 %] 96 % (11/03 0811) Awake alert in no distress Normocephalic atraumatic head Regular rate rhythm Clear chest No evidence of aphasia No evidence of dysarthria Cranial nerves II to XII intact-left face has a prominent swelling which makes the overall appearance of the face somewhat asymmetric but no issues with smiling or at rest asymmetry. Motor examination with no drift in any of the 4 extremities and sensation is intact to light touch all over.  No dysmetria noted.  Essentially normal neurological exam overall  Medications  Current Facility-Administered Medications:    0.9 %  sodium chloride infusion, , Intravenous, Continuous, Regalado, Belkys A, MD, Last Rate: 40 mL/hr at 06/03/23 0602, New Bag at 06/03/23 0602   albuterol (PROVENTIL) (2.5 MG/3ML) 0.083% nebulizer solution 3 mL, 3 mL, Inhalation, Q4H PRN, Mikey College T, MD   bismuth subsalicylate (PEPTO BISMOL) chewable tablet 524 mg, 524 mg, Oral, PRN, Mikey College T, MD   ceFAZolin (ANCEF) IVPB 2g/100 mL premix, 2 g, Intravenous, Q8H, Regalado, Belkys A, MD, Last Rate: 200 mL/hr at 06/03/23 0603, 2 g at 06/03/23 0603   cyclobenzaprine (FLEXERIL) tablet 5 mg, 5 mg, Oral, TID PRN, Mikey College T, MD, 5 mg at 06/01/23 0816   docusate sodium (COLACE) capsule 100 mg, 100 mg, Oral, BID, Chipper Herb, Ping T, MD, 100 mg at 06/03/23 0807   enoxaparin (LOVENOX) injection 47.5 mg, 0.5 mg/kg, Subcutaneous, Q24H, Lowella Bandy, RPH, 47.5 mg at 06/02/23 2211    fluconazole (DIFLUCAN) IVPB 200 mg, 200 mg, Intravenous, Q24H, Regalado, Belkys A, MD, Last Rate: 100 mL/hr at 06/02/23 1748, 200 mg at 06/02/23 1748   fluticasone (FLONASE) 50 MCG/ACT nasal spray 1 spray, 1 spray, Each Nare, Daily PRN, Mikey College T, MD   ibuprofen (ADVIL) tablet 400 mg, 400 mg, Oral, Q6H PRN, Mikey College T, MD, 400 mg at 06/02/23 2212   insulin aspart (novoLOG) injection 0-20 Units, 0-20 Units, Subcutaneous, TID WC, Mikey College T, MD, 15 Units at 06/03/23 0811   insulin aspart (novoLOG) injection 0-5 Units, 0-5 Units, Subcutaneous, QHS, Mikey College T, MD, 2 Units at 06/02/23 2209   insulin aspart (novoLOG) injection 14 Units, 14 Units, Subcutaneous, TID WC, Mikey College T, MD, 14 Units at 06/03/23 0810   insulin aspart (novoLOG) injection 15 Units, 15 Units, Subcutaneous, Once, Regalado, Belkys A, MD   insulin glargine-yfgn (SEMGLEE) injection 30 Units, 30 Units, Subcutaneous, QHS, Regalado, Belkys A, MD, 30 Units at 06/02/23 2211   insulin glargine-yfgn (SEMGLEE) injection 45 Units, 45 Units, Subcutaneous, Daily, Mikey College T, MD, 45 Units at 06/02/23 0904   meclizine (ANTIVERT) tablet 25 mg, 25 mg, Oral, TID PRN, Mikey College T, MD   morphine (PF) 2 MG/ML injection 2 mg, 2 mg, Intravenous, Q2H PRN, Mikey College T, MD, 2 mg at 06/01/23 1453   ondansetron (ZOFRAN) tablet 4 mg, 4 mg, Oral, Q6H PRN **OR** ondansetron (ZOFRAN)  injection 4 mg, 4 mg, Intravenous, Q6H PRN, Mikey College T, MD   ondansetron (ZOFRAN-ODT) disintegrating tablet 8 mg, 8 mg, Oral, TID, Mikey College T, MD, 8 mg at 05/31/23 2248   oxyCODONE-acetaminophen (PERCOCET/ROXICET) 5-325 MG per tablet 1 tablet, 1 tablet, Oral, Q6H PRN, Mikey College T, MD, 1 tablet at 06/03/23 0236   pantoprazole (PROTONIX) EC tablet 40 mg, 40 mg, Oral, Daily, Mikey College T, MD, 40 mg at 06/03/23 0807   polyethylene glycol (MIRALAX / GLYCOLAX) packet 17 g, 17 g, Oral, BID, Regalado, Belkys A, MD, 17 g at 06/03/23 0828   senna (SENOKOT) tablet  8.6 mg, 1 tablet, Oral, Daily, Regalado, Belkys A, MD, 8.6 mg at 06/03/23 0828   tamsulosin (FLOMAX) capsule 0.4 mg, 0.4 mg, Oral, QPC supper, Mikey College T, MD, 0.4 mg at 06/02/23 1747  Labs CBC    Component Value Date/Time   WBC 13.0 (H) 06/03/2023 0729   RBC 3.88 (L) 06/03/2023 0729   HGB 11.8 (L) 06/03/2023 0729   HGB 10.7 (L) 11/20/2014 1232   HCT 35.1 (L) 06/03/2023 0729   HCT 32.3 (L) 11/20/2014 1232   PLT 384 06/03/2023 0729   PLT 248 11/20/2014 1232   MCV 90.5 06/03/2023 0729   MCV 92 11/20/2014 1232   MCH 30.4 06/03/2023 0729   MCHC 33.6 06/03/2023 0729   RDW 12.1 06/03/2023 0729   RDW 15.9 (H) 11/20/2014 1232   LYMPHSABS 1.5 05/31/2023 0646   LYMPHSABS 1.3 11/13/2014 1858   MONOABS 1.3 (H) 05/31/2023 0646   MONOABS 0.4 11/13/2014 1858   EOSABS 0.1 05/31/2023 0646   EOSABS 0.8 (H) 11/13/2014 1858   BASOSABS 0.1 05/31/2023 0646   BASOSABS 0.1 11/13/2014 1858    CMP     Component Value Date/Time   NA 134 (L) 06/03/2023 0729   NA 134 (L) 11/20/2014 1232   K 3.9 06/03/2023 0729   K 3.9 11/20/2014 1232   CL 102 06/03/2023 0729   CL 100 (L) 11/20/2014 1232   CO2 24 06/03/2023 0729   CO2 28 11/20/2014 1232   GLUCOSE 371 (H) 06/03/2023 0729   GLUCOSE 184 (H) 11/20/2014 1232   BUN 19 06/03/2023 0729   BUN 9 11/20/2014 1232   CREATININE 0.84 06/03/2023 0729   CREATININE 1.08 11/20/2014 1232   CALCIUM 7.9 (L) 06/03/2023 0729   CALCIUM 8.6 (L) 11/20/2014 1232   PROT 7.0 05/31/2023 0646   PROT 5.9 (L) 11/01/2014 1413   ALBUMIN 2.9 (L) 05/31/2023 0646   ALBUMIN 2.2 (L) 11/01/2014 1413   AST 12 (L) 05/31/2023 0646   AST 20 11/01/2014 1413   ALT 19 05/31/2023 0646   ALT 27 11/01/2014 1413   ALKPHOS 93 05/31/2023 0646   ALKPHOS 97 11/01/2014 1413   BILITOT 1.0 05/31/2023 0646   BILITOT 2.1 (H) 11/01/2014 1413   GFRNONAA >60 06/03/2023 0729   GFRNONAA >60 11/20/2014 1232   GFRAA >60 01/27/2020 1310   GFRAA >60 11/20/2014 1232    Lipid Panel     Component  Value Date/Time   CHOL 125 06/06/2016 0441   CHOL 91 05/12/2014 0524   TRIG 114 06/06/2016 0441   TRIG 102 05/12/2014 0524   HDL 37 (L) 06/06/2016 0441   HDL 33 (L) 05/12/2014 0524   CHOLHDL 3.4 06/06/2016 0441   VLDL 23 06/06/2016 0441   VLDL 20 05/12/2014 0524   LDLCALC 65 06/06/2016 0441   LDLCALC 38 05/12/2014 0524    Lab Results  Component Value Date  HGBA1C 12.6 (H) 05/31/2023     Imaging I have reviewed images in epic and the results pertinent to this consultation are: CT head and MRI brain without acute findings  Assessment: 62 year old man past history of IDDM, CAD, hypertension, DVT, chronic benign left parotid tumor, peripheral neuropathy, history of pseudoseizures and functional exams, admitted for pyelonephritis and UTI seen yesterday via telestroke for sudden onset of left-sided weakness.  Had a lot of functional features on the exam for which she was respiratory and MRI which was negative for acute stroke. Today his exam is normal. 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  Recommendations: No further inpatient neurological workup is needed. This was discussed with Dr.Regalado  -- Milon Dikes, MD Neurologist [facility] Pager: [phone]

## 2023-06-04 DIAGNOSIS — B9561 Methicillin susceptible Staphylococcus aureus infection as the cause of diseases classified elsewhere: Secondary | ICD-10-CM

## 2023-06-04 DIAGNOSIS — N12 Tubulo-interstitial nephritis, not specified as acute or chronic: Secondary | ICD-10-CM | POA: Diagnosis not present

## 2023-06-04 LAB — CBC
HCT: 38 % — ABNORMAL LOW (ref 39.0–52.0)
Hemoglobin: 12.8 g/dL — ABNORMAL LOW (ref 13.0–17.0)
MCH: 30.7 pg (ref 26.0–34.0)
MCHC: 33.7 g/dL (ref 30.0–36.0)
MCV: 91.1 fL (ref 80.0–100.0)
Platelets: 393 10*3/uL (ref 150–400)
RBC: 4.17 MIL/uL — ABNORMAL LOW (ref 4.22–5.81)
RDW: 12.3 % (ref 11.5–15.5)
WBC: 11 10*3/uL — ABNORMAL HIGH (ref 4.0–10.5)
nRBC: 0 % (ref 0.0–0.2)

## 2023-06-04 LAB — GLUCOSE, CAPILLARY
Glucose-Capillary: 153 mg/dL — ABNORMAL HIGH (ref 70–99)
Glucose-Capillary: 202 mg/dL — ABNORMAL HIGH (ref 70–99)
Glucose-Capillary: 228 mg/dL — ABNORMAL HIGH (ref 70–99)
Glucose-Capillary: 184 mg/dL — ABNORMAL HIGH (ref 70–99)

## 2023-06-04 NOTE — Plan of Care (Signed)

## 2023-06-04 NOTE — Consult Note (Signed)
NAME: Michael Gill  DOB: [birth year]  MRN: [id number]  Date/Time: 06/04/2023 5:05 PM  REQUESTING PROVIDER: Dr.Regalado Subjective:  REASON FOR CONSULT: Staph aureus kidney infection ? Michael Gill is a 62 y.o. with a history of DM poorly controlled Aic 12.6, PE on eliquis , Parotid gland tumor, DM, HTN, CVA, OSA Presented with parotid swelling of 5 days duration. He was also c/o rt lower quadrant abdominal pain Vitals in the ED   05/31/23  BP 121/62  Temp 98.4 F (36.9 C)  Pulse Rate 108 !  Resp 18  SpO2 97 %  Weight 212 lb  Height 5\' 8"  (1.727 m)    Latest Reference Range & Units 05/31/23  WBC 4.0 - 10.5 K/uL 17.6 (H)  Hemoglobin 13.0 - 17.0 g/dL 65.7  HCT 84.6 - 96.2 % 38.1 (L)  Platelets 150 - 400 K/uL 378    Ct abdomen and pelvis revealed rt perinephric stranding , wedge -shaped areas concerning for abscess, phlegmon, cyst and also prostate cyst Vs abscess- He initially was on ceftriaxone /vanco and now cefazolin He was seen by urology and they wanted repeat imaging  Urine culture was MSSA- no blood culture was done until 06/02/23 I am asked to see the patient for further management  He was also seen by neuro for questionable stroke and it was functional/ MRI No acute findings Seen by ENT for left parotid swelling- recommended referral to [facility] as OP for parotidectomy for oncocytic tumor Past Medical History:  Diagnosis Date   Asthma    B12 deficiency    in the past   CAD (coronary artery disease)    s/p MI   Colon cancer (HCC) 1990   Complication of anesthesia    long time to wake up from colonoscopy.   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   GERD (gastroesophageal reflux  disease)    History of kidney stones 1990   Hx of therapeutic radiation    Hyperlipidemia    Myocardial infarction (HCC) 1990   Neuropathic pain, leg    Pseudoseizure    last episode 2/19.[name] HA, slurry speach, eyes twirl, frothy at mouth, tremors of hand.   Seizure (HCC)    last episode 08/2017. has no warning, will just happen. 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    difficulty forming words at times   Stroke [facility]) 2012, 03/2018   residual tremor left hand, facial droop   Tremors of nervous system    specifically left hand, since stroke    Past Surgical History:  Procedure Laterality Date   APPENDECTOMY  1971   CARDIAC CATHETERIZATION Left 10/12/2015   Procedure: Left Heart Cath and Coronary Angiography;  Surgeon: Alwyn Pea, MD;  Location: [facility] INVASIVE CV LAB;  Service: Cardiovascular;  Laterality: Left;   TONSILLECTOMY      Social History   Socioeconomic History   Marital status: Married    Spouse name: Lupita Leash   Number of children: Not on file   Years of education: Not on file  Highest education level: Not on file  Occupational History   Occupation: retired    Comment: disabled  Tobacco Use   Smoking status: Never   Smokeless tobacco: Never  Vaping Use   Vaping status: Never Used  Substance and Sexual Activity   Alcohol use: No   Drug use: No   Sexual activity: Not on file  Other Topics Concern   Not on file  Social History Narrative   Not working, disabled. Some deficits d/t prior stroke. History of seizures, MI and TIA   Wife is very supportive   Social Determinants of Corporate investment banker Strain: Not on file  Food Insecurity: No Food Insecurity (05/31/2023)   Hunger Vital Sign    Worried About Running Out of Food in the Last Year: Never true    Ran Out of Food in the Last Year: Never true  Transportation Needs: No Transportation Needs (05/31/2023)    PRAPARE - Administrator, Civil Service (Medical): No    Lack of Transportation (Non-Medical): No  Physical Activity: Not on file  Stress: Not on file  Social Connections: Not on file  Intimate Partner Violence: Not At Risk (05/31/2023)   Humiliation, Afraid, Rape, and Kick questionnaire    Fear of Current or Ex-Partner: No    Emotionally Abused: No    Physically Abused: No    Sexually Abused: No    Family History  Problem Relation Age of Onset   Alzheimer's disease Mother    Congestive Heart Failure Father    Colon cancer Brother    Allergies  Allergen Reactions   Shellfish Allergy Hives    Swelling of throat and mouth. Betadine is NOT a problem   Codeine Other (See Comments)    Reaction:  Hallucinations    Sulfa Antibiotics Hives   I? Current Facility-Administered Medications  Medication Dose Route Frequency Provider Last Rate Last Admin   albuterol (PROVENTIL) (2.5 MG/3ML) 0.083% nebulizer solution 3 mL  3 mL Inhalation Q4H PRN Mikey College T, MD       bismuth subsalicylate (PEPTO BISMOL) chewable tablet 524 mg  524 mg Oral PRN Mikey College T, MD       ceFAZolin (ANCEF) IVPB 2g/100 mL premix  2 g Intravenous Q8H Barrie Folk, RPH 200 mL/hr at 06/04/23 1214 2 g at 06/04/23 1214   cyanocobalamin (VITAMIN B12) tablet 500 mcg  500 mcg Oral Daily Regalado, Belkys A, MD   500 mcg at 06/04/23 0902   cyclobenzaprine (FLEXERIL) tablet 5 mg  5 mg Oral TID PRN Mikey College T, MD   5 mg at 06/01/23 0816   docusate sodium (COLACE) capsule 100 mg  100 mg Oral BID Mikey College T, MD   100 mg at 06/04/23 0903   enoxaparin (LOVENOX) injection 47.5 mg  0.5 mg/kg Subcutaneous Q24H Lowella Bandy, RPH   47.5 mg at 06/03/23 2241   fluconazole (DIFLUCAN) tablet 200 mg  200 mg Oral Daily Sharen Hones, RPH   200 mg at 06/04/23 0903   fluticasone (FLONASE) 50 MCG/ACT nasal spray 1 spray  1 spray Each Nare Daily PRN Mikey College T, MD       ibuprofen (ADVIL) tablet 400 mg  400 mg  Oral Q6H PRN Mikey College T, MD   400 mg at 06/04/23 0508   insulin aspart (novoLOG) injection 0-20 Units  0-20 Units Subcutaneous TID WC Emeline General, MD   7 Units at 06/04/23 1701   insulin aspart (  novoLOG) injection 0-5 Units  0-5 Units Subcutaneous QHS Mikey College T, MD   2 Units at 06/02/23 2209   insulin aspart (novoLOG) injection 14 Units  14 Units Subcutaneous TID WC Mikey College T, MD   14 Units at 06/04/23 1702   insulin aspart (novoLOG) injection 15 Units  15 Units Subcutaneous Once Regalado, Belkys A, MD       insulin glargine-yfgn (SEMGLEE) injection 30 Units  30 Units Subcutaneous QHS Regalado, Belkys A, MD   30 Units at 06/03/23 2242   insulin glargine-yfgn (SEMGLEE) injection 45 Units  45 Units Subcutaneous Daily Mikey College T, MD   45 Units at 06/04/23 1610   meclizine (ANTIVERT) tablet 25 mg  25 mg Oral TID PRN Mikey College T, MD       morphine (PF) 2 MG/ML injection 2 mg  2 mg Intravenous Q2H PRN Mikey College T, MD   2 mg at 06/01/23 1453   ondansetron (ZOFRAN) tablet 4 mg  4 mg Oral Q6H PRN Mikey College T, MD       Or   ondansetron [facility]) injection 4 mg  4 mg Intravenous Q6H PRN Mikey College T, MD       ondansetron (ZOFRAN-ODT) disintegrating tablet 8 mg  8 mg Oral TID Mikey College T, MD   8 mg at 05/31/23 2248   oxyCODONE-acetaminophen (PERCOCET/ROXICET) 5-325 MG per tablet 1 tablet  1 tablet Oral Q6H PRN Emeline General, MD   1 tablet at 06/04/23 0911   pantoprazole (PROTONIX) EC tablet 40 mg  40 mg Oral Daily Mikey College T, MD   40 mg at 06/04/23 0903   polyethylene glycol (MIRALAX / GLYCOLAX) packet 17 g  17 g Oral BID Regalado, Belkys A, MD   17 g at 06/04/23 0902   senna (SENOKOT) tablet 8.6 mg  1 tablet Oral Daily Regalado, Belkys A, MD   8.6 mg at 06/04/23 0903   tamsulosin (FLOMAX) capsule 0.4 mg  0.4 mg Oral QPC supper Mikey College T, MD   0.4 mg at 06/04/23 1702     Abtx:  Anti-infectives (From admission, onward)    Start     Dose/Rate Route Frequency Ordered Stop    06/03/23 2000  ceFAZolin (ANCEF) IVPB 2g/100 mL premix        2 g 200 mL/hr over 30 Minutes Intravenous Every 8 hours 06/03/23 1941     06/03/23 1400  fluconazole (DIFLUCAN) tablet 200 mg        200 mg Oral Daily 06/03/23 1209     06/02/23 2200  vancomycin (VANCOREADY) IVPB 1250 mg/250 mL  Status:  Discontinued        1,250 mg 166.7 mL/hr over 90 Minutes Intravenous Every 12 hours 06/02/23 0844 06/02/23 1328   06/02/23 2200  ceFAZolin (ANCEF) IVPB 2g/100 mL premix  Status:  Discontinued        2 g 200 mL/hr over 30 Minutes Intravenous Every 8 hours 06/02/23 1328 06/03/23 1941   06/02/23 0945  vancomycin (VANCOREADY) IVPB 1750 mg/350 mL        1,750 mg 175 mL/hr over 120 Minutes Intravenous  Once 06/02/23 0848 06/02/23 1229   06/02/23 0930  vancomycin (VANCOREADY) IVPB 1250 mg/250 mL  Status:  Discontinued        1,250 mg 166.7 mL/hr over 90 Minutes Intravenous Every 12 hours 06/02/23 0843 06/02/23 0844   06/02/23 0930  vancomycin (VANCOREADY) IVPB 2000 mg/400 mL  Status:  Discontinued  2,000 mg 200 mL/hr over 120 Minutes Intravenous  Once 06/02/23 0844 06/02/23 0848   06/01/23 1400  fluconazole (DIFLUCAN) IVPB 200 mg  Status:  Discontinued        200 mg 100 mL/hr over 60 Minutes Intravenous Every 24 hours 06/01/23 1311 06/03/23 1209   06/01/23 1000  cefTRIAXone (ROCEPHIN) 2 g in sodium chloride 0.9 % 100 mL IVPB  Status:  Discontinued        2 g 200 mL/hr over 30 Minutes Intravenous Every 24 hours 05/31/23 1320 06/02/23 1328   05/31/23 0900  cefTRIAXone (ROCEPHIN) 2 g in sodium chloride 0.9 % 100 mL IVPB        2 g 200 mL/hr over 30 Minutes Intravenous  Once 05/31/23 0858 05/31/23 1008       REVIEW OF SYSTEMS:  Const: no fever, negative chills, negative weight loss Eyes: negative diplopia or visual changes, negative eye pain ENT: negative coryza, negative sore throat Resp: negative cough, hemoptysis, dyspnea Cards: negative for chest pain, palpitations, lower extremity  edema GU: rt flank pain  frequency, dysuria , but no  hematuria GI: Negative for abdominal pain, diarrhea, bleeding, constipation Skin: negative for rash and pruritus Heme: negative for easy bruising and gum/nose bleeding MS: upper back pain Neurolo:negative for headaches, dizziness, vertigo, memory problems  Psych: anxiety  Endocrine:, diabetes Allergy/Immunology-sulfa HIVES Objective:  VITALS:  BP (!) 136/96 (BP Location: Left Arm)   Pulse 82   Temp 97.7 F (36.5 C) (Oral)   Resp 18   Ht 5\' 8"  (1.727 m)   Wt 96.2 kg   SpO2 96%   BMI 32.23 kg/m   PHYSICAL EXAM:  General: Alert, cooperative, no distress, appears stated age.  Head: Normocephalic, without obvious abnormality, atraumatic. Left parotid gland swollen, tender Eyes: Conjunctivae clear, anicteric sclerae. Pupils are equal ENT Nares normal. No drainage or sinus tenderness. Lips, mucosa, and tongue normal. No Thrush Neck: Supple, symmetrical, no adenopathy, thyroid: non tender no carotid bruit and no JVD. Back: No CVA tenderness. Lungs: Clear to auscultation bilaterally. No Wheezing or Rhonchi. No rales. Heart: Regular rate and rhythm, no murmur, rub or gallop. Abdomen: Soft, non-tender,not distended. Bowel sounds normal. No masses Extremities: atraumatic, no cyanosis. No edema. No clubbing Skin: No rashes or lesions. Or bruising Lymph: Cervical, supraclavicular normal. Neurologic: Grossly non-focal Pertinent Labs Lab Results CBC    Component Value Date/Time   WBC 11.0 (H) 06/04/2023 1251   RBC 4.17 (L) 06/04/2023 1251   HGB 12.8 (L) 06/04/2023 1251   HGB 10.7 (L) 11/20/2014 1232   HCT 38.0 (L) 06/04/2023 1251   HCT 32.3 (L) 11/20/2014 1232   PLT 393 06/04/2023 1251   PLT 248 11/20/2014 1232   MCV 91.1 06/04/2023 1251   MCV 92 11/20/2014 1232   MCH 30.7 06/04/2023 1251   MCHC 33.7 06/04/2023 1251   RDW 12.3 06/04/2023 1251   RDW 15.9 (H) 11/20/2014 1232   LYMPHSABS 1.5 05/31/2023 0646   LYMPHSABS 1.3  11/13/2014 1858   MONOABS 1.3 (H) 05/31/2023 0646   MONOABS 0.4 11/13/2014 1858   EOSABS 0.1 05/31/2023 0646   EOSABS 0.8 (H) 11/13/2014 1858   BASOSABS 0.1 05/31/2023 0646   BASOSABS 0.1 11/13/2014 1858       Latest Ref Rng & Units 06/03/2023    7:29 AM 06/02/2023    3:44 AM 06/01/2023    6:34 AM  CMP  Glucose 70 - 99 mg/dL 161  096  045   BUN 8 - 23 mg/dL  19  22  13    Creatinine 0.61 - 1.24 mg/dL 2.95  2.84  1.32   Sodium 135 - 145 mmol/L 134  131  131   Potassium 3.5 - 5.1 mmol/L 3.9  4.3  3.5   Chloride 98 - 111 mmol/L 102  98  96   CO2 22 - 32 mmol/L 24  23  25    Calcium 8.9 - 10.3 mg/dL 7.9  8.2  8.1       Microbiology: Recent Results (from the past 240 hour(s))  Urine Culture     Status: Abnormal   Collection Time: 05/31/23  6:46 AM   Specimen: Urine, Clean Catch  Result Value Ref Range Status   Specimen Description   Final    URINE, CLEAN CATCH Performed at [facility], [address] [place], [place] [address]    Special Requests   Final    NONE Performed at [facility], [address], [place], [place] [address]    Culture >=100,000 COLONIES/mL STAPHYLOCOCCUS AUREUS (A)  Final   Report Status 06/02/2023 FINAL  Final   Organism ID, Bacteria STAPHYLOCOCCUS AUREUS (A)  Final      Susceptibility   Staphylococcus aureus - MIC*    CIPROFLOXACIN <=0.5 SENSITIVE Sensitive     GENTAMICIN <=0.5 SENSITIVE Sensitive     NITROFURANTOIN 32 SENSITIVE Sensitive     OXACILLIN 0.5 SENSITIVE Sensitive     TETRACYCLINE >=16 RESISTANT Resistant     VANCOMYCIN 1 SENSITIVE Sensitive     TRIMETH/SULFA <=10 SENSITIVE Sensitive     CLINDAMYCIN <=0.25 SENSITIVE Sensitive     RIFAMPIN <=0.5 SENSITIVE Sensitive     Inducible Clindamycin NEGATIVE Sensitive     LINEZOLID 2 SENSITIVE Sensitive     * >=100,000 COLONIES/mL STAPHYLOCOCCUS AUREUS  Culture, blood (Routine X 2) w Reflex to ID Panel     Status: None (Preliminary result)   Collection Time: 06/02/23   8:31 AM   Specimen: BLOOD RIGHT HAND  Result Value Ref Range Status   Specimen Description BLOOD RIGHT HAND  Final   Special Requests   Final    BOTTLES DRAWN AEROBIC AND ANAEROBIC Blood Culture results may not be optimal due to an excessive volume of blood received in culture bottles   Culture   Final    NO GROWTH 2 DAYS Performed at [facility], [address] [place], [place] [address]    Report Status PENDING  Incomplete  Culture, blood (Routine X 2) w Reflex to ID Panel     Status: None (Preliminary result)   Collection Time: 06/02/23  8:37 AM   Specimen: BLOOD LEFT HAND  Result Value Ref Range Status   Specimen Description BLOOD LEFT HAND  Final   Special Requests   Final    BOTTLES DRAWN AEROBIC AND ANAEROBIC Blood Culture results may not be optimal due to an excessive volume of blood received in culture bottles   Culture   Final    NO GROWTH 2 DAYS Performed at [facility], [address] [place], [place] [address]    Report Status PENDING  Incomplete    IMAGING RESULTS:   Rt kidney I have personally reviewed the films pyelonephritis, renal abscess VS cyst  Impression/Recommendation ? Staphylococcus aureus Rt  pyelonephritis with questionable renal abscess Staph aureus is not an organism to cause primary UTI but can be primary bacteremia with seeding of the kidney. Unfortunately blood culture was not done for 3 days after starting antibiotic, so  we will have to assume he had Bacteremia IF this is a renal abscess or prostate abscess he will need aspiration of the abscesses and 4-6 weeks of antibiotic which would be IV Spoke with urologist and will repeat imaging to see the progression of the findings and need for intervention   Left parotid mass - chronic but flares Needs parotidectomy Wonder whether he has staph infection there as well( thought Ct did not show any abscess ( only cysts) Cefazolin wll take care of that  DM- poorly controlled  - management as per primary team  HTN- as per primary team  H/o PE on eliquis   ________________________________________________ Discussed with patient, wife, urologist and hospitalist Note:  This document was prepared using Dragon voice recognition software and may include unintentional dictation errors.

## 2023-06-04 NOTE — Progress Notes (Signed)
PROGRESS NOTE    Michael Gill  LOV:[id number] DOB: 06/23/1961 DOA: 05/31/2023 PCP: Oswaldo Conroy, MD   Brief Narrative: 62 year old with past medical history significant for IDDM with insulin resistance, CAD, hypertension, DVT, chronic benign left parotid tumor, chronic peripheral neuropathy presents with worsening right flank pain.  Symptoms started 4 days prior to admission.  Evaluation in the ED he was tachycardic, leukocytosis white blood cell 17 hyponatremia sodium 128.  CT abdomen and pelvis show anterior genius enhancement of the right kidney is with several active send the perinephric stranding implying sequela of renal infection with pyelonephritis.  UA compatible with UTI and hematuria.     Assessment & Plan:   Principal Problem:   Pyelonephritis Active Problems:   Acute pyelonephritis   BPH (benign prostatic hyperplasia)   1-Acute pyelonephritis CT abdomen and pelvis:Heterogeneous enhancement of the right kidney with severe adjacent perinephric stranding. There is also some ill-defined cystic areas in the kidney. There is also urothelial thickening along the course of the right ureter and renal pelvis. Based on appearance this could be sequela of renal infection with pyelonephritis. If so, the cystic areas could represent early abscess formation or phlegmonous change. -Urology consulted, plan to repeat imagine in 72 if WBC does not mormalized or if he develops new fever.  Can consider IR involvement if drainable fluid collection is seen -Urine culture. Growing staph. Repeat culture pedning -On  Fluconazole. Budding yeast  -Continue with IV ceftriaxone, added vancomycin. Urine growing Staph Aureus.  Blood culture. Negative.  -ECHO; No Valve vegetation.  -Will consult ID today.   Left side weakness, Numbness and tingling.  Code stroke activated 11/02 Appreciate Dr Rosalita Levan evaluation.  CT , MRI negative Symptoms thought to be functional. Resolved.  No further neurology  evaluation.   SIRS: Only presented with leukocytosis, transient tachycardia -Treated  with IV fluids.   Hyponatremia: and pseudohyponatremia related to hyperglycemia.  Sodium correction by hyperglycemia at 133 -treated with IV fluids.   Chronic left side parotid mass: Illustrated on today's ultrasound, no abscess.  Continue follow-up outpatient with ENT PRN Ibuprofen.  He was having worsening facial pain and swelling. ENT consulted.  He has received 2 doses of IV decadron. Started prednisone 20 mg for 2 days.   Diabetes type 2, with hyperglycemia uncontrolled SSI Continue with Semglee 45 units and 30 units hs, and meal coverage.    History of DVT: Change prophylactic Eliquis to subcu Lovenox in case require drainage of abscess  Constipation; On  miralax.  Had BM   Estimated body mass index is 32.23 kg/m as calculated from the following:   Height as of this encounter: 5\' 8"  (1.727 m).   Weight as of this encounter: 96.2 kg.   DVT prophylaxis: Lovenox Code Status: Full code Family Communication:care discussed with patient. Son over phone 11/03 Disposition Plan:  Status is: Inpatient Remains inpatient appropriate because: management of Infection    Consultants:  Urology   Procedures:  None  Antimicrobials:    Subjective: He is back to baseline .  Facial pain improved. ] Objective: Vitals:   06/03/23 1731 06/03/23 1943 06/04/23 0403 06/04/23 0842  BP: 124/67 128/76 106/66 113/77  Pulse: 82 80 96 89  Resp:  16 16 18   Temp: 98.4 F (36.9 C) 97.8 F (36.6 C) 98.1 F (36.7 C) 97.7 F (36.5 C)  TempSrc: Oral Oral Oral Oral  SpO2: 100% 98% 94% 97%  Weight:      Height:  Intake/Output Summary (Last 24 hours) at 06/04/2023 1237 Last data filed at 06/04/2023 0842 Gross per 24 hour  Intake --  Output 1550 ml  Net -1550 ml   Filed Weights   05/31/23 0643  Weight: 96.2 kg    Examination:  General exam: NAD Respiratory system;  CTA Cardiovascular system: S 1, S 2 RRR Gastrointestinal system: BS present, soft, nt Central nervous system: Alert, follows command Extremities: No edema   Data Reviewed: I have personally reviewed following labs and imaging studies  CBC: Recent Labs  Lab 05/31/23 0646 06/01/23 0634 06/02/23 0344 06/03/23 0729  WBC 17.6* 16.0* 12.3* 13.0*  NEUTROABS 14.1*  --   --   --   HGB 13.4 12.8* 12.1* 11.8*  HCT 38.1* 37.5* 35.5* 35.1*  MCV 88.4 90.6 91.3 90.5  PLT 378 373 361 384   Basic Metabolic Panel: Recent Labs  Lab 05/31/23 0646 06/01/23 0634 06/02/23 0344 06/03/23 0729  NA 128* 131* 131* 134*  K 3.5 3.5 4.3 3.9  CL 93* 96* 98 102  CO2 25 25 23 24   GLUCOSE 414* 231* 415* 371*  BUN 11 13 22 19   CREATININE 0.91 0.80 0.91 0.84  CALCIUM 8.4* 8.1* 8.2* 7.9*   GFR: Estimated Creatinine Clearance: 101.2 mL/min (by C-G formula based on SCr of 0.84 mg/dL). Liver Function Tests: Recent Labs  Lab 05/31/23 0646  AST 12*  ALT 19  ALKPHOS 93  BILITOT 1.0  PROT 7.0  ALBUMIN 2.9*   No results for input(s): "LIPASE", "AMYLASE" in the last 168 hours. Recent Labs  Lab 06/02/23 1837  AMMONIA <10   Coagulation Profile: No results for input(s): "INR", "PROTIME" in the last 168 hours. Cardiac Enzymes: No results for input(s): "CKTOTAL", "CKMB", "CKMBINDEX", "TROPONINI" in the last 168 hours. BNP (last 3 results) No results for input(s): "PROBNP" in the last 8760 hours. HbA1C: No results for input(s): "HGBA1C" in the last 72 hours.  CBG: Recent Labs  Lab 06/03/23 1145 06/03/23 1637 06/03/23 2143 06/04/23 0842 06/04/23 1203  GLUCAP 287* 170* 138* 153* 202*   Lipid Profile: No results for input(s): "CHOL", "HDL", "LDLCALC", "TRIG", "CHOLHDL", "LDLDIRECT" in the last 72 hours. Thyroid Function Tests: No results for input(s): "TSH", "T4TOTAL", "FREET4", "T3FREE", "THYROIDAB" in the last 72 hours. Anemia Panel: Recent Labs    06/03/23 0337  VITAMINB12 314    Sepsis Labs: Recent Labs  Lab 05/31/23 0646 05/31/23 0844  LATICACIDVEN 1.1 1.0    Recent Results (from the past 240 hour(s))  Urine Culture     Status: Abnormal   Collection Time: 05/31/23  6:46 AM   Specimen: Urine, Clean Catch  Result Value Ref Range Status   Specimen Description   Final    URINE, CLEAN CATCH Performed at [facility], [address], [place], [place] [address]    Special Requests   Final    NONE Performed at [facility], [address] [place], [place] [address]    Culture >=100,000 COLONIES/mL STAPHYLOCOCCUS AUREUS (A)  Final   Report Status 06/02/2023 FINAL  Final   Organism ID, Bacteria STAPHYLOCOCCUS AUREUS (A)  Final      Susceptibility   Staphylococcus aureus - MIC*    CIPROFLOXACIN <=0.5 SENSITIVE Sensitive     GENTAMICIN <=0.5 SENSITIVE Sensitive     NITROFURANTOIN 32 SENSITIVE Sensitive     OXACILLIN 0.5 SENSITIVE Sensitive     TETRACYCLINE >=16 RESISTANT Resistant     VANCOMYCIN 1 SENSITIVE Sensitive     TRIMETH/SULFA <=10  SENSITIVE Sensitive     CLINDAMYCIN <=0.25 SENSITIVE Sensitive     RIFAMPIN <=0.5 SENSITIVE Sensitive     Inducible Clindamycin NEGATIVE Sensitive     LINEZOLID 2 SENSITIVE Sensitive     * >=100,000 COLONIES/mL STAPHYLOCOCCUS AUREUS  Culture, blood (Routine X 2) w Reflex to ID Panel     Status: None (Preliminary result)   Collection Time: 06/02/23  8:31 AM   Specimen: BLOOD RIGHT HAND  Result Value Ref Range Status   Specimen Description BLOOD RIGHT HAND  Final   Special Requests   Final    BOTTLES DRAWN AEROBIC AND ANAEROBIC Blood Culture results may not be optimal due to an excessive volume of blood received in culture bottles   Culture   Final    NO GROWTH 2 DAYS Performed at [facility], [address], [place], [place] [address]    Report Status PENDING  Incomplete  Culture, blood (Routine X 2) w Reflex to ID Panel     Status: None (Preliminary result)   Collection Time:  06/02/23  8:37 AM   Specimen: BLOOD LEFT HAND  Result Value Ref Range Status   Specimen Description BLOOD LEFT HAND  Final   Special Requests   Final    BOTTLES DRAWN AEROBIC AND ANAEROBIC Blood Culture results may not be optimal due to an excessive volume of blood received in culture bottles   Culture   Final    NO GROWTH 2 DAYS Performed at [facility], [address], [place], [place] [address]    Report Status PENDING  Incomplete         Radiology Studies: ECHOCARDIOGRAM COMPLETE  Result Date: 06/03/2023    ECHOCARDIOGRAM REPORT   Patient Name:   Michael Gill Date of Exam: 06/03/2023 Medical Rec #:  [id number]     Height:       68.0 in Accession #:    [id number]   Weight:       212.0 lb Date of Birth:  1961-04-10    BSA:          2.095 m Patient Age:    62 years     BP:           151/81 mmHg Patient Gender: M            HR:           92 bpm. Exam Location:  [facility] Procedure: 2D Echo, Cardiac Doppler and Color Doppler Indications:     Abnormal ECG R94.31  History:         Patient has prior history of Echocardiogram examinations, most                  recent 06/06/2016. CAD, Stroke and TIA; Risk Factors:Diabetes,                  Hypertension and Non-Smoker. H/o DVT PE.  Sonographer:     Dondra Prader RVT RCS Referring Phys:  [name] Diagnosing Phys: Alwyn Pea MD IMPRESSIONS  1. Left ventricular ejection fraction, by estimation, is 60 to 65%. The left ventricle has normal function. The left ventricle has no regional wall motion abnormalities. Left ventricular diastolic parameters are consistent with Grade II diastolic dysfunction (pseudonormalization).  2. Right ventricular systolic function is normal. The right ventricular size is normal.  3. The mitral valve is normal in structure. No evidence of mitral valve regurgitation.  4. The aortic valve is normal in structure. Aortic valve  regurgitation is not visualized. FINDINGS  Left Ventricle: Left ventricular ejection  fraction, by estimation, is 60 to 65%. The left ventricle has normal function. The left ventricle has no regional wall motion abnormalities. The left ventricular internal cavity size was normal in size. There is  no left ventricular hypertrophy. Left ventricular diastolic parameters are consistent with Grade II diastolic dysfunction (pseudonormalization). Right Ventricle: The right ventricular size is normal. No increase in right ventricular wall thickness. Right ventricular systolic function is normal. Left Atrium: Left atrial size was normal in size. Right Atrium: Right atrial size was normal in size. Pericardium: There is no evidence of pericardial effusion. Mitral Valve: The mitral valve is normal in structure. No evidence of mitral valve regurgitation. Tricuspid Valve: The tricuspid valve is normal in structure. Tricuspid valve regurgitation is trivial. Aortic Valve: The aortic valve is normal in structure. Aortic valve regurgitation is not visualized. Aortic valve mean gradient measures 5.3 mmHg. Aortic valve peak gradient measures 9.7 mmHg. Aortic valve area, by VTI measures 2.82 cm. Pulmonic Valve: The pulmonic valve was normal in structure. Pulmonic valve regurgitation is trivial. Aorta: The aortic arch was not well visualized. IAS/Shunts: No atrial level shunt detected by color flow Doppler.  LEFT VENTRICLE PLAX 2D LVIDd:         4.90 cm   Diastology LVIDs:         3.10 cm   LV e' medial:    8.49 cm/s LV PW:         1.10 cm   LV E/e' medial:  9.1 LV IVS:        0.90 cm   LV e' lateral:   12.70 cm/s LVOT diam:     2.20 cm   LV E/e' lateral: 6.1 LV SV:         79 LV SV Index:   38 LVOT Area:     3.80 cm  RIGHT VENTRICLE             IVC RV S prime:     17.00 cm/s  IVC diam: 1.50 cm TAPSE (M-mode): 2.9 cm LEFT ATRIUM             Index        RIGHT ATRIUM           Index LA diam:        4.10 cm 1.96 cm/m   RA Area:     16.90 cm LA Vol (A2C):   51.2 ml 24.44 ml/m  RA Volume:   47.50 ml  22.67 ml/m LA Vol  (A4C):   39.3 ml 18.76 ml/m LA Biplane Vol: 48.6 ml 23.20 ml/m  AORTIC VALVE                     PULMONIC VALVE AV Area (Vmax):    2.84 cm      PV Vmax:        1.04 m/s AV Area (Vmean):   2.84 cm      PV Peak grad:   4.3 mmHg AV Area (VTI):     2.82 cm      RVOT Peak grad: 5 mmHg AV Vmax:           155.33 cm/s AV Vmean:          106.667 cm/s AV VTI:            0.282 m AV Peak Grad:      9.7 mmHg AV Mean Grad:  5.3 mmHg LVOT Vmax:         116.00 cm/s LVOT Vmean:        79.700 cm/s LVOT VTI:          0.209 m LVOT/AV VTI ratio: 0.74  AORTA Ao Root diam: 3.80 cm Ao Asc diam:  3.40 cm MITRAL VALVE MV Area (PHT): 4.80 cm    SHUNTS MV Decel Time: 158 msec    Systemic VTI:  0.21 m MV E velocity: 77.00 cm/s  Systemic Diam: 2.20 cm MV A velocity: 56.60 cm/s MV E/A ratio:  1.36 Dwayne D Callwood MD Electronically signed by Alwyn Pea MD Signature Date/Time: 06/03/2023/6:44:29 PM    Final    MR BRAIN WO CONTRAST  Result Date: 06/02/2023 CLINICAL DATA:  Left facial droop and extremity weakness EXAM: MRI HEAD WITHOUT CONTRAST TECHNIQUE: Multiplanar, multiecho pulse sequences of the brain and surrounding structures were obtained without intravenous contrast. COMPARISON:  Same-day CT head FINDINGS: A truncated protocol including axial and coronal DWI, axial FLAIR, and axial SWI sequences. Brain: There is no acute intracranial hemorrhage, extra-axial fluid collection, or acute infarct. Parenchymal volume is normal. The ventricles are normal in size. Parenchymal signal is normal on the provided sequences. There is no mass lesion.  There is no mass effect or midline shift. Vascular: Suboptimally assessed on the provided sequences. Skull and upper cervical spine: Suboptimally assessed on the provided sequences. Sinuses/Orbits: No acute finding. Other: The mastoid air cells and middle ear cavities are clear. IMPRESSION: No acute intracranial pathology. Electronically Signed   By: Lesia Hausen M.D.   On: 06/02/2023  17:17   CT HEAD CODE STROKE WO CONTRAST`  Result Date: 06/02/2023 CLINICAL DATA:  Code stroke. Left facial droop and upper and lower extremity weakness. EXAM: CT HEAD WITHOUT CONTRAST TECHNIQUE: Contiguous axial images were obtained from the base of the skull through the vertex without intravenous contrast. RADIATION DOSE REDUCTION: This exam was performed according to the departmental dose-optimization program which includes automated exposure control, adjustment of the mA and/or kV according to patient size and/or use of iterative reconstruction technique. COMPARISON:  CT head 09/19/2022 FINDINGS: Brain: There is no acute intracranial hemorrhage, extra-axial fluid collection, or acute infarct. Parenchymal volume is normal. The ventricles are normal in size. Gray-white differentiation is preserved. The pituitary and suprasellar region are normal. There is no mass lesion. There is no mass effect or midline shift. Vascular: No hyperdense vessel or unexpected calcification. Skull: Normal. Negative for fracture or focal lesion. Sinuses/Orbits: The paranasal sinuses are clear. The globes and orbits are unremarkable. Other: Multiple left parotid masses are again seen, previously evaluated with biopsy, ultrasound, and CT neck. ASPECTS [facility] Stroke Program Early CT Score) - Ganglionic level infarction (caudate, lentiform nuclei, internal capsule, insula, M1-M3 cortex): 7 - Supraganglionic infarction (M4-M6 cortex): 3 Total score (0-10 with 10 being normal): 10 IMPRESSION: 1. No acute intracranial pathology 2. ASPECTS is 10 Findings communicated via AMION to Dr Pearlean Brownie at 4:34 pm. Electronically Signed   By: Lesia Hausen M.D.   On: 06/02/2023 16:35        Scheduled Meds:  vitamin B-12  500 mcg Oral Daily   docusate sodium  100 mg Oral BID   enoxaparin (LOVENOX) injection  0.5 mg/kg Subcutaneous Q24H   fluconazole  200 mg Oral Daily   insulin aspart  0-20 Units Subcutaneous TID WC   insulin aspart  0-5 Units  Subcutaneous QHS   insulin aspart  14 Units Subcutaneous TID WC  insulin aspart  15 Units Subcutaneous Once   insulin glargine-yfgn  30 Units Subcutaneous QHS   insulin glargine-yfgn  45 Units Subcutaneous Daily   ondansetron  8 mg Oral TID   pantoprazole  40 mg Oral Daily   polyethylene glycol  17 g Oral BID   senna  1 tablet Oral Daily   tamsulosin  0.4 mg Oral QPC supper   Continuous Infusions:   ceFAZolin (ANCEF) IV 2 g (06/04/23 1214)     LOS: 4 days    Time spent: 35 minutes    Makaylie Dedeaux A Jase Reep, MD [facility]   If 7PM-7AM, please contact night-coverage [email or web address]  06/04/2023, 12:37 PM

## 2023-06-04 NOTE — Plan of Care (Signed)
  Problem: Education: Goal: Ability to describe self-care measures that may prevent or decrease complications (Diabetes Survival Skills Education) will improve Outcome: Progressing   Problem: Health Behavior/Discharge Planning: Goal: Ability to manage health-related needs will improve Outcome: Progressing   Problem: Metabolic: Goal: Ability to maintain appropriate glucose levels will improve Outcome: Progressing   Problem: Education: Goal: Knowledge of General Education information will improve Description: Including pain rating scale, medication(s)/side effects and non-pharmacologic comfort measures Outcome: Progressing   Problem: Health Behavior/Discharge Planning: Goal: Ability to manage health-related needs will improve Outcome: Progressing

## 2023-06-05 ENCOUNTER — Encounter: Payer: Self-pay | Admitting: Internal Medicine

## 2023-06-05 ENCOUNTER — Inpatient Hospital Stay: Payer: Medicare HMO

## 2023-06-05 DIAGNOSIS — N12 Tubulo-interstitial nephritis, not specified as acute or chronic: Secondary | ICD-10-CM | POA: Diagnosis not present

## 2023-06-05 LAB — COMPREHENSIVE METABOLIC PANEL
ALT: 20 U/L (ref 0–44)
AST: 19 U/L (ref 15–41)
Albumin: 2.5 g/dL — ABNORMAL LOW (ref 3.5–5.0)
Alkaline Phosphatase: 56 U/L (ref 38–126)
Anion gap: 9 (ref 5–15)
BUN: 16 mg/dL (ref 8–23)
CO2: 28 mmol/L (ref 22–32)
Calcium: 8.5 mg/dL — ABNORMAL LOW (ref 8.9–10.3)
Chloride: 98 mmol/L (ref 98–111)
Creatinine, Ser: 0.79 mg/dL (ref 0.61–1.24)
GFR, Estimated: >60 mL/min (ref >60)
Glucose, Bld: 142 mg/dL — ABNORMAL HIGH (ref 70–99)
Potassium: 3.8 mmol/L (ref 3.5–5.1)
Sodium: 135 mmol/L (ref 135–145)
Total Bilirubin: 0.4 mg/dL (ref <1.2)
Total Protein: 6.1 g/dL — ABNORMAL LOW (ref 6.5–8.1)

## 2023-06-05 LAB — CBC WITH DIFFERENTIAL/PLATELET
Abs Immature Granulocytes: 0.22 10*3/uL — ABNORMAL HIGH (ref 0.00–0.07)
Basophils Absolute: 0.1 10*3/uL (ref 0.0–0.1)
Basophils Relative: 1 %
Eosinophils Absolute: 0.1 10*3/uL (ref 0.0–0.5)
Eosinophils Relative: 1 %
HCT: 39.2 % (ref 39.0–52.0)
Hemoglobin: 13.1 g/dL (ref 13.0–17.0)
Immature Granulocytes: 2 %
Lymphocytes Relative: 27 %
Lymphs Abs: 2.7 10*3/uL (ref 0.7–4.0)
MCH: 30.2 pg (ref 26.0–34.0)
MCHC: 33.4 g/dL (ref 30.0–36.0)
MCV: 90.3 fL (ref 80.0–100.0)
Monocytes Absolute: 0.9 10*3/uL (ref 0.1–1.0)
Monocytes Relative: 9 %
Neutro Abs: 5.9 10*3/uL (ref 1.7–7.7)
Neutrophils Relative %: 60 %
Platelets: 425 10*3/uL — ABNORMAL HIGH (ref 150–400)
RBC: 4.34 MIL/uL (ref 4.22–5.81)
RDW: 12.4 % (ref 11.5–15.5)
WBC: 9.9 10*3/uL (ref 4.0–10.5)
nRBC: 0 % (ref 0.0–0.2)

## 2023-06-05 LAB — URINE CULTURE

## 2023-06-05 LAB — GLUCOSE, CAPILLARY
Glucose-Capillary: 176 mg/dL — ABNORMAL HIGH (ref 70–99)
Glucose-Capillary: 168 mg/dL — ABNORMAL HIGH (ref 70–99)
Glucose-Capillary: 85 mg/dL (ref 70–99)
Glucose-Capillary: 200 mg/dL — ABNORMAL HIGH (ref 70–99)

## 2023-06-05 MED ORDER — IOHEXOL 9 MG/ML PO SOLN
500.0000 mL | ORAL | Status: AC
  Administered 2023-06-05 (×2): 500 mL via ORAL

## 2023-06-05 MED ORDER — IOHEXOL 300 MG/ML  SOLN
100.0000 mL | Freq: Once | INTRAMUSCULAR | Status: AC | PRN
  Administered 2023-06-05: 100 mL via INTRAVENOUS

## 2023-06-05 NOTE — Progress Notes (Signed)
Date of Admission:  05/31/2023   Total days of antibiotics ***        Day ***        Day ***        Day ***   ID: Michael Gill is a 62 y.o. male with  *** Principal Problem:   Pyelonephritis Active Problems:   Acute pyelonephritis   BPH (benign prostatic hyperplasia)    Subjective: ***  Medications:   vitamin B-12  500 mcg Oral Daily   docusate sodium  100 mg Oral BID   enoxaparin (LOVENOX) injection  0.5 mg/kg Subcutaneous Q24H   insulin aspart  0-20 Units Subcutaneous TID WC   insulin aspart  0-5 Units Subcutaneous QHS   insulin aspart  14 Units Subcutaneous TID WC   insulin aspart  15 Units Subcutaneous Once   insulin glargine-yfgn  30 Units Subcutaneous QHS   insulin glargine-yfgn  45 Units Subcutaneous Daily   ondansetron  8 mg Oral TID   pantoprazole  40 mg Oral Daily   polyethylene glycol  17 g Oral BID   senna  1 tablet Oral Daily   tamsulosin  0.4 mg Oral QPC supper    Objective: Vital signs in last 24 hours: Patient Vitals for the past 24 hrs:  BP Temp Temp src Pulse Resp SpO2  06/05/23 1948 102/71 98.2 F (36.8 C) Oral (!) 101 18 98 %  06/05/23 1618 124/77 98.4 F (36.9 C) Oral 75 16 100 %  06/05/23 0851 112/73 98.2 F (36.8 C) -- 93 18 99 %  06/05/23 0349 121/79 97.8 F (36.6 C) Oral 71 18 97 %     LDA Foley Central lines Other catheters  PHYSICAL EXAM:  General: Alert, cooperative, no distress, appears stated age.  Head: Normocephalic, without obvious abnormality, atraumatic. Eyes: Conjunctivae clear, anicteric sclerae. Pupils are equal ENT Nares normal. No drainage or sinus tenderness. Lips, mucosa, and tongue normal. No Thrush Neck: Supple, symmetrical, no adenopathy, thyroid: non tender no carotid bruit and no JVD. Back: No CVA tenderness. Lungs: Clear to auscultation bilaterally. No Wheezing or Rhonchi. No rales. Heart: Regular rate and rhythm, no murmur, rub or gallop. Abdomen: Soft, non-tender,not distended. Bowel sounds normal.  No masses Extremities: atraumatic, no cyanosis. No edema. No clubbing Skin: No rashes or lesions. Or bruising Lymph: Cervical, supraclavicular normal. Neurologic: Grossly non-focal  Lab Results    Latest Ref Rng & Units 06/05/2023    8:54 AM 06/04/2023   12:51 PM 06/03/2023    7:29 AM  CBC  WBC 4.0 - 10.5 K/uL 9.9  11.0  13.0   Hemoglobin 13.0 - 17.0 g/dL 40.9  81.1  91.4   Hematocrit 39.0 - 52.0 % 39.2  38.0  35.1   Platelets 150 - 400 K/uL 425  393  384        Latest Ref Rng & Units 06/05/2023    8:54 AM 06/03/2023    7:29 AM 06/02/2023    3:44 AM  CMP  Glucose 70 - 99 mg/dL 782  956  213   BUN 8 - 23 mg/dL 16  19  22    Creatinine 0.61 - 1.24 mg/dL 0.86  5.78  4.69   Sodium 135 - 145 mmol/L 135  134  131   Potassium 3.5 - 5.1 mmol/L 3.8  3.9  4.3   Chloride 98 - 111 mmol/L 98  102  98   CO2 22 - 32 mmol/L 28  24  23    Calcium  8.9 - 10.3 mg/dL 8.5  7.9  8.2   Total Protein 6.5 - 8.1 g/dL 6.1     Total Bilirubin <1.2 mg/dL 0.4     Alkaline Phos 38 - 126 U/L 56     AST 15 - 41 U/L 19     ALT 0 - 44 U/L 20         Microbiology:  Studies/Results: CT ABDOMEN PELVIS W CONTRAST  Result Date: 06/05/2023 CLINICAL DATA:  Pyelonephritis, renal abscess EXAM: CT ABDOMEN AND PELVIS WITH CONTRAST TECHNIQUE: Multidetector CT imaging of the abdomen and pelvis was performed using the standard protocol following bolus administration of intravenous contrast. RADIATION DOSE REDUCTION: This exam was performed according to the departmental dose-optimization program which includes automated exposure control, adjustment of the mA and/or kV according to patient size and/or use of iterative reconstruction technique. CONTRAST:  OMNIPAQUE IOHEXOL 300 MG/ML  SOLN COMPARISON:  05/31/2023 FINDINGS: Lower chest: Mild bibasilar pulmonary fibrotic change. No acute abnormality. Hepatobiliary: No focal liver abnormality is seen. No gallstones, gallbladder wall thickening, or biliary dilatation. Pancreas:  Unremarkable Spleen: Unremarkable Adrenals/Urinary Tract: The adrenal glands are unremarkable. Heterogeneous enhancement and perinephric inflammatory stranding and soft tissue within the right posterior pararenal space, best seen on axial image # [id number] has improved in the interval since prior examination with decreasing size and conspicuity of cystic collections within the renal cortex in keeping with resolving renal abscesses and/or phlegmonous change. No discrete loculated fluid collection is identified to suggest AA organized abscess at this time. There is persistent mild asymmetric urothelial enhancement of the right renal collecting system compatible with ascending urinary tract infection. No loculated perinephric fluid collections are seen. No hydronephrosis. No intrarenal or ureteral calculi. The left kidney is unremarkable save for a simple cortical cyst within the lower pole for which no follow-up imaging is recommended. The bladder is unremarkable. Stomach/Bowel: Mild colonic diverticulosis without superimposed acute inflammatory change. Moderate colonic stool burden without evidence of obstruction. The stomach, small bowel, and large bowel are otherwise unremarkable. The appendix is absent. No free intraperitoneal gas or fluid. Vascular/Lymphatic: Aortic atherosclerosis. No enlarged abdominal or pelvic lymph nodes. Reproductive: The prostate gland is moderately enlarged. Similar prior examination, there are multiple rounded hypoenhancing collections within the inferior peripheral zone suspicious for areas of focal prostatitis or developing prostatic abscesses. Other: Small fat containing umbilical hernia. Mild diffuse subcutaneous body wall edema has progressed in the interval since prior examination in keeping with progressive anasarca. Musculoskeletal: No acute bone abnormality. No lytic or blastic bone lesion. IMPRESSION: 1. Interval improvement in right-sided pyelonephritis and resolving renal  abscesses and/or phlegmonous change. No discrete loculated fluid collection is identified to suggest a organized abscess at this time. 2. Persistent mild asymmetric urothelial enhancement of the right renal collecting system compatible with ascending urinary tract infection. 3. Multiple rounded hypoenhancing collections within the inferior peripheral zone of the prostate gland suspicious for areas of focal prostatitis or developing prostatic abscesses. Correlation for prostate symptoms is recommended. 4. Progressive anasarca. 5. Mild colonic diverticulosis without superimposed acute inflammatory change. 6. Moderate colonic stool burden without evidence of obstruction. Aortic Atherosclerosis (ICD10-I70.0). Electronically Signed   By: Helyn Numbers M.D.   On: 06/05/2023 19:04     Assessment/Plan: ***

## 2023-06-05 NOTE — Progress Notes (Signed)
PROGRESS NOTE    Michael Gill  NWG:[id number] DOB: [birth year] DOA: 05/31/2023 PCP: Oswaldo Conroy, MD   Brief Narrative: 62 year old with past medical history significant for IDDM with insulin resistance, CAD, hypertension, DVT, chronic benign left parotid tumor, chronic peripheral neuropathy presents with worsening right flank pain.  Symptoms started 4 days prior to admission.  Evaluation in the ED he was tachycardic, leukocytosis white blood cell 17 hyponatremia sodium 128.  CT abdomen and pelvis show anterior genius enhancement of the right kidney is with several active send the perinephric stranding implying sequela of renal infection with pyelonephritis.  UA compatible with UTI and hematuria.  Patient admitted for acute pyelonephritis and possible early kidney abscess.  Urine culture grew staph.  Blood cultures were obtained after he was on IV antibiotics.  Plan to repeat CT abdomen and pelvis to see if he needs to have aspiration of the abscess.  ID is recommending IV antibiotics at discharge   Assessment & Plan:   Principal Problem:   Pyelonephritis Active Problems:   Acute pyelonephritis   BPH (benign prostatic hyperplasia)   1-Acute pyelonephritis CT abdomen and pelvis:Heterogeneous enhancement of the right kidney with severe adjacent perinephric stranding. There is also some ill-defined cystic areas in the kidney. There is also urothelial thickening along the course of the right ureter and renal pelvis. Based on appearance this could be sequela of renal infection with pyelonephritis. If so, the cystic areas could represent early abscess formation or phlegmonous change. -Urology consulted, plan to repeat imagine in 72 if WBC does not mormalized or if he develops new fever.  Can consider IR involvement if drainable fluid collection is seen -Urine culture. Growing staph.  -Continue with IV Ancef.  -ID consulted.  Plan to repeat CT abdomen and pelvis to determine if aspiration  of the abscess is amenable.  -ID is recommending IV antibiotic at discharge ECHO; no vegetation.   Left side weakness, Numbness and tingling.  Code stroke activated 11/02 Appreciate Dr Rosalita Levan evaluation.  CT , MRI negative Symptoms thought to be functional. Resolved.  No further neurology evaluation.   SIRS: Only presented with leukocytosis, transient tachycardia -Treated  with IV fluids.   Hyponatremia: and pseudohyponatremia related to hyperglycemia.  Sodium correction by hyperglycemia at 133 -treated with IV fluids.   Chronic left side parotid mass: Illustrated on today's ultrasound, no abscess.  Continue follow-up outpatient with ENT PRN Ibuprofen.  He was having worsening facial pain and swelling. ENT consulted. Recommend steroids.  He has received 2 doses of IV decadron. He will complete  prednisone 20 mg for 2 days.   Diabetes type 2, with hyperglycemia uncontrolled SSI Continue with Semglee 45 units and 30 units hs, and meal coverage.    History of DVT: Change prophylactic Eliquis to subcu Lovenox in case require drainage of abscess  Constipation; started miralax. Had BM     Estimated body mass index is 32.23 kg/m as calculated from the following:   Height as of this encounter: 5\' 8"  (1.727 m).   Weight as of this encounter: 96.2 kg.   DVT prophylaxis: Lovenox Code Status: Full code Family Communication:care discussed with patient. Son updated over phone 11/05 Disposition Plan:  Status is: Inpatient Remains inpatient appropriate because: management of Infection    Consultants:  Urology   Procedures:  None  Antimicrobials:    Subjective: Back pain resolved. Feels better. He wants to go home.   Objective: Vitals:   06/04/23 1614 06/04/23 1955 06/05/23 0349 06/05/23 5784  BP: (!) 136/96 (!) 146/86 121/79 112/73  Pulse: 82 95 71 93  Resp: 18 18 18 18   Temp:  98.3 F (36.8 C) 97.8 F (36.6 C) 98.2 F (36.8 C)  TempSrc:   Oral   SpO2: 96% 98%  97% 99%  Weight:      Height:        Intake/Output Summary (Last 24 hours) at 06/05/2023 1141 Last data filed at 06/05/2023 0402 Gross per 24 hour  Intake --  Output 800 ml  Net -800 ml   Filed Weights   05/31/23 0643  Weight: 96.2 kg    Examination:  General exam: NAD Respiratory system: CTA Cardiovascular system: S 1, S 2 RRR Gastrointestinal system: BS present, soft nt Central nervous system: Alert Extremities: No edema   Data Reviewed: I have personally reviewed following labs and imaging studies  CBC: Recent Labs  Lab 05/31/23 0646 06/01/23 0634 06/02/23 0344 06/03/23 0729 06/04/23 1251 06/05/23 0854  WBC 17.6* 16.0* 12.3* 13.0* 11.0* 9.9  NEUTROABS 14.1*  --   --   --   --  5.9  HGB 13.4 12.8* 12.1* 11.8* 12.8* 13.1  HCT 38.1* 37.5* 35.5* 35.1* 38.0* 39.2  MCV 88.4 90.6 91.3 90.5 91.1 90.3  PLT 378 373 361 384 393 425*   Basic Metabolic Panel: Recent Labs  Lab 05/31/23 0646 06/01/23 0634 06/02/23 0344 06/03/23 0729 06/05/23 0854  NA 128* 131* 131* 134* 135  K 3.5 3.5 4.3 3.9 3.8  CL 93* 96* 98 102 98  CO2 25 25 23 24 28   GLUCOSE 414* 231* 415* 371* 142*  BUN 11 13 22 19 16   CREATININE 0.91 0.80 0.91 0.84 0.79  CALCIUM 8.4* 8.1* 8.2* 7.9* 8.5*   GFR: Estimated Creatinine Clearance: 106.3 mL/min (by C-G formula based on SCr of 0.79 mg/dL). Liver Function Tests: Recent Labs  Lab 05/31/23 0646 06/05/23 0854  AST 12* 19  ALT 19 20  ALKPHOS 93 56  BILITOT 1.0 0.4  PROT 7.0 6.1*  ALBUMIN 2.9* 2.5*   No results for input(s): "LIPASE", "AMYLASE" in the last 168 hours. Recent Labs  Lab 06/02/23 1837  AMMONIA <10   Coagulation Profile: No results for input(s): "INR", "PROTIME" in the last 168 hours. Cardiac Enzymes: No results for input(s): "CKTOTAL", "CKMB", "CKMBINDEX", "TROPONINI" in the last 168 hours. BNP (last 3 results) No results for input(s): "PROBNP" in the last 8760 hours. HbA1C: No results for input(s): "HGBA1C" in the last  72 hours.  CBG: Recent Labs  Lab 06/04/23 0842 06/04/23 1203 06/04/23 1613 06/04/23 2105 06/05/23 0732  GLUCAP 153* 202* 228* 184* 176*   Lipid Profile: No results for input(s): "CHOL", "HDL", "LDLCALC", "TRIG", "CHOLHDL", "LDLDIRECT" in the last 72 hours. Thyroid Function Tests: No results for input(s): "TSH", "T4TOTAL", "FREET4", "T3FREE", "THYROIDAB" in the last 72 hours. Anemia Panel: Recent Labs    06/03/23 0337  VITAMINB12 314   Sepsis Labs: Recent Labs  Lab 05/31/23 0646 05/31/23 0844  LATICACIDVEN 1.1 1.0    Recent Results (from the past 240 hour(s))  Urine Culture     Status: Abnormal   Collection Time: 05/31/23  6:46 AM   Specimen: Urine, Clean Catch  Result Value Ref Range Status   Specimen Description   Final    URINE, CLEAN CATCH Performed at [facility], [address] [place], [place] [address]    Special Requests   Final    NONE Performed at [facility], [address], [place], [place]  [address]    Culture >=100,000 COLONIES/mL STAPHYLOCOCCUS AUREUS (A)  Final   Report Status 06/02/2023 FINAL  Final   Organism ID, Bacteria STAPHYLOCOCCUS AUREUS (A)  Final      Susceptibility   Staphylococcus aureus - MIC*    CIPROFLOXACIN <=0.5 SENSITIVE Sensitive     GENTAMICIN <=0.5 SENSITIVE Sensitive     NITROFURANTOIN 32 SENSITIVE Sensitive     OXACILLIN 0.5 SENSITIVE Sensitive     TETRACYCLINE >=16 RESISTANT Resistant     VANCOMYCIN 1 SENSITIVE Sensitive     TRIMETH/SULFA <=10 SENSITIVE Sensitive     CLINDAMYCIN <=0.25 SENSITIVE Sensitive     RIFAMPIN <=0.5 SENSITIVE Sensitive     Inducible Clindamycin NEGATIVE Sensitive     LINEZOLID 2 SENSITIVE Sensitive     * >=100,000 COLONIES/mL STAPHYLOCOCCUS AUREUS  Culture, blood (Routine X 2) w Reflex to ID Panel     Status: None (Preliminary result)   Collection Time: 06/02/23  8:31 AM   Specimen: BLOOD RIGHT HAND  Result Value Ref Range Status   Specimen Description BLOOD RIGHT  HAND  Final   Special Requests   Final    BOTTLES DRAWN AEROBIC AND ANAEROBIC Blood Culture results may not be optimal due to an excessive volume of blood received in culture bottles   Culture   Final    NO GROWTH 3 DAYS Performed at [facility], [address], [place], [place] [address]    Report Status PENDING  Incomplete  Culture, blood (Routine X 2) w Reflex to ID Panel     Status: None (Preliminary result)   Collection Time: 06/02/23  8:37 AM   Specimen: BLOOD LEFT HAND  Result Value Ref Range Status   Specimen Description BLOOD LEFT HAND  Final   Special Requests   Final    BOTTLES DRAWN AEROBIC AND ANAEROBIC Blood Culture results may not be optimal due to an excessive volume of blood received in culture bottles   Culture   Final    NO GROWTH 3 DAYS Performed at [facility], [address], [place], [place] [address]    Report Status PENDING  Incomplete  Urine Culture (for pregnant, neutropenic or urologic patients or patients with an indwelling urinary catheter)     Status: Abnormal   Collection Time: 06/03/23 10:24 PM   Specimen: Urine, Clean Catch  Result Value Ref Range Status   Specimen Description   Final    URINE, CLEAN CATCH Performed at [facility], [address] [place], [place] [address]    Special Requests   Final    NONE Performed at [facility], [address] [place], [place] [address]    Culture MULTIPLE SPECIES PRESENT, SUGGEST RECOLLECTION (A)  Final   Report Status 06/05/2023 FINAL  Final         Radiology Studies: No results found.      Scheduled Meds:  vitamin B-12  500 mcg Oral Daily   docusate sodium  100 mg Oral BID   enoxaparin (LOVENOX) injection  0.5 mg/kg Subcutaneous Q24H   insulin aspart  0-20 Units Subcutaneous TID WC   insulin aspart  0-5 Units Subcutaneous QHS   insulin aspart  14 Units Subcutaneous TID WC   insulin aspart  15 Units Subcutaneous Once   insulin glargine-yfgn   30 Units Subcutaneous QHS   insulin glargine-yfgn  45 Units Subcutaneous Daily   ondansetron  8 mg Oral TID   pantoprazole  40 mg Oral Daily   polyethylene  glycol  17 g Oral BID   senna  1 tablet Oral Daily   tamsulosin  0.4 mg Oral QPC supper   Continuous Infusions:   ceFAZolin (ANCEF) IV 2 g (06/05/23 0402)     LOS: 5 days    Time spent: 35 minutes    Bibi Economos A Annitta Fifield, MD [facility]   If 7PM-7AM, please contact night-coverage [email or web address]  06/05/2023, 11:41 AM

## 2023-06-05 NOTE — TOC Initial Note (Signed)
Transition of Care [facility]) - Initial/Assessment Note    Patient Details  Name: Michael Gill MRN: [id number] Date of Birth: 1961/07/20  Transition of Care [facility]) CM/SW Contact:    Chapman Fitch, RN Phone Number: 06/05/2023, 3:49 PM  Clinical Narrative:                  Per MD and ID patient will require home IV antibiotics at discharge  Admitted for:v pyelonephritis  Admitted from: home wit wife PCP: [name] Baseline patient is independent and drives  Reviewed need for home IV antibiotics.  Patient in agreement for home health.  Patient states he does not have a preference of home health agency.  Referral made to [facility] with [facility].  Referral made to [facility] with [facility] for infusion    Expected Discharge Plan: Home w Home Health Services     Patient Goals and CMS Choice            Expected Discharge Plan and Services                                              Prior Living Arrangements/Services                       Activities of Daily Living   ADL Screening (condition at time of admission) Independently performs ADLs?: Yes (appropriate for developmental age) Does the patient have a NEW difficulty with bathing/dressing/toileting/self-feeding that is expected to last >3 days?: No Does the patient have a NEW difficulty with getting in/out of bed, walking, or climbing stairs that is expected to last >3 days?: No Does the patient have a NEW difficulty with communication that is expected to last >3 days?: No Is the patient deaf or have difficulty hearing?: No Does the patient have difficulty seeing, even when wearing glasses/contacts?: Yes Does the patient have difficulty concentrating, remembering, or making decisions?: Yes  Permission Sought/Granted                  Emotional Assessment              Admission diagnosis:  Pyelonephritis [N12] Type 2 diabetes mellitus with hyperglycemia, with long-term current use of insulin (HCC)  [E11.65, Z79.4] Patient Active Problem List   Diagnosis Date Noted   Acute pyelonephritis 05/31/2023   BPH (benign prostatic hyperplasia) 05/31/2023   Pyelonephritis 05/31/2023   History of pulmonary embolism 08/05/2019   History of colon cancer 08/05/2019   Chronic anticoagulation 08/05/2019   Pulmonary embolism (HCC) 02/25/2019   Sepsis (HCC) 01/30/2019   Deep vein thrombosis (DVT) of popliteal vein of right lower extremity (HCC) 10/30/2014   TIA (transient ischemic attack) 01/19/2014   PCP:  Oswaldo Conroy, MD Pharmacy:   [facility] [place], [place] - [address] [place] [address] Phone: [phone] Fax: [phone]  [facility] [address] ([address]), [place] - [address] [place]) [place] [address] Phone: [phone] Fax: [phone]     Social Determinants of Health (SDOH) Social History: SDOH Screenings   Food Insecurity: No Food Insecurity (05/31/2023)  Housing: Low Risk  (05/31/2023)  Transportation Needs: No Transportation Needs (05/31/2023)  Utilities: Not At Risk (05/31/2023)  Tobacco Use: Low Risk  (05/31/2023)   SDOH Interventions:  Readmission Risk Interventions     No data to display

## 2023-06-06 ENCOUNTER — Other Ambulatory Visit: Payer: Self-pay

## 2023-06-06 DIAGNOSIS — N401 Enlarged prostate with lower urinary tract symptoms: Secondary | ICD-10-CM

## 2023-06-06 DIAGNOSIS — N10 Acute tubulo-interstitial nephritis: Secondary | ICD-10-CM | POA: Diagnosis not present

## 2023-06-06 DIAGNOSIS — B9561 Methicillin susceptible Staphylococcus aureus infection as the cause of diseases classified elsewhere: Secondary | ICD-10-CM | POA: Diagnosis not present

## 2023-06-06 DIAGNOSIS — N412 Abscess of prostate: Secondary | ICD-10-CM

## 2023-06-06 DIAGNOSIS — N12 Tubulo-interstitial nephritis, not specified as acute or chronic: Secondary | ICD-10-CM | POA: Diagnosis not present

## 2023-06-06 LAB — GLUCOSE, CAPILLARY
Glucose-Capillary: 212 mg/dL — ABNORMAL HIGH (ref 70–99)
Glucose-Capillary: 169 mg/dL — ABNORMAL HIGH (ref 70–99)

## 2023-06-06 MED ORDER — CEFAZOLIN IV (FOR PTA / DISCHARGE USE ONLY): 2.0000 g | Freq: Three times a day (TID) | INTRAVENOUS | 0 refills | Status: DC

## 2023-06-06 MED ORDER — SODIUM CHLORIDE 0.9% FLUSH: 10.0000 mL | Freq: Two times a day (BID) | INTRAVENOUS | Status: DC

## 2023-06-06 MED ORDER — CHLORHEXIDINE GLUCONATE CLOTH 2 % EX PADS: 6.0000 | MEDICATED_PAD | Freq: Every day | CUTANEOUS | Status: DC

## 2023-06-06 MED ORDER — TAMSULOSIN HCL 0.4 MG PO CAPS: 0.4000 mg | ORAL_CAPSULE | Freq: Every day | ORAL | 0 refills | Status: DC

## 2023-06-06 MED ORDER — SODIUM CHLORIDE 0.9% FLUSH: 10.0000 mL | INTRAVENOUS | Status: DC | PRN

## 2023-06-06 MED ORDER — CYANOCOBALAMIN 500 MCG PO TABS: 500.0000 ug | ORAL_TABLET | Freq: Every day | ORAL | 3 refills | Status: DC

## 2023-06-06 NOTE — Discharge Summary (Signed)
Physician Discharge Summary   Patient: Michael Gill MRN: [id number] DOB: [birth year]  Admit date:     05/31/2023  Discharge date: 06/06/23  Discharge Physician: Tyrone Nine   PCP: Oswaldo Conroy, MD   Recommendations at discharge:  Follow up with ID as schedule 12/5 for management of MSSA right renal abscess/prostatitis, unable to r/o bacteremia, discharged on ancef thru 12/12 thru PICC line placed 11/6 on day of discharge.  Will need routine measurement of CBC, BMP.  Follow up with urology after discharge for ongoing management. Started flomax to help with LUTS which have drastically improved.  Follow up with PCP in 1-2 weeks, consider recheck vitamin B12 levels after supplementation started here.  Follow up per routine with ENT for chronic left parotid mass.   Discharge Diagnoses: Principal Problem:   Pyelonephritis Active Problems:   Acute pyelonephritis   BPH (benign prostatic hyperplasia)   Prostate abscess  Hospital Course: 62 year old with past medical history significant for IDDM with insulin resistance, CAD, hypertension, DVT, chronic benign left parotid tumor, chronic peripheral neuropathy presents with worsening right flank pain.  Symptoms started 4 days prior to admission.  Evaluation in the ED he was tachycardic, leukocytosis white blood cell 17 hyponatremia sodium 128.  CT abdomen and pelvis show anterior genius enhancement of the right kidney is with several active send the perinephric stranding implying sequela of renal infection with pyelonephritis.  UA compatible with UTI and hematuria.   Patient admitted for acute pyelonephritis and possible early kidney abscess.  Urine culture grew staph.  Blood cultures were obtained after he was on IV antibiotics.  Plan to repeat CT abdomen and pelvis to see if he needs to have aspiration of the abscess. No drainable fluid collections per urology. ID is recommending IV antibiotics at discharge which have been ordered.    Assessment and Plan: Acute pyelonephritis CT abdomen and pelvis:Heterogeneous enhancement of the right kidney with severe adjacent perinephric stranding. There is also some ill-defined cystic areas in the kidney. There is also urothelial thickening along the course of the right ureter and renal pelvis. Based on appearance this could be sequela of renal infection with pyelonephritis. If so, the cystic areas could represent early abscess formation or phlegmonous change. -Urology consulted, no drainable abscesses on repeat CT. WBC is normalized, remains afebrile with sustained clinical improvement. Will f/u - ID consulted, will f/u 12/5, continue IV Ancef thru PICC placed 11/6 until 12/12. ECHO; no vegetation.    Left side weakness, Numbness and tingling.  Code stroke activated 11/02 Appreciate Dr Wilford Corner evaluation.  CT , MRI negative Symptoms thought to be functional. Resolved.  No further neurology evaluation.    SIRS: Only presented with leukocytosis, transient tachycardia - Resolved.   Hyponatremia: and pseudohyponatremia related to hyperglycemia. Resolved.   Chronic left side parotid mass: Illustrated on ultrasound, no abscess.  Continue follow-up outpatient with ENT PRN Ibuprofen.  He was having worsening facial pain and swelling. ENT consulted. He has received steroids per ENT recommendations. Will need outpatient follow up.     Diabetes type 2, with hyperglycemia uncontrolled SSI Continue with Semglee 45 units and 30 units hs, and meal coverage.    History of DVT: Continue home Eliquis    Constipation; started miralax. Had BM    Obesity: Estimated body mass index is 32.23 kg/m   Consultants: Urology, ID Procedures performed: None  Disposition: Home Diet recommendation:  Cardiac and Carb modified diet DISCHARGE MEDICATION: Allergies as of 06/06/2023  Reactions   Shellfish Allergy Hives   Swelling of throat and mouth. Betadine is NOT a problem   Codeine Other  (See Comments)   Reaction:  Hallucinations    Sulfa Antibiotics Hives        Medication List     TAKE these medications    acetaminophen 500 MG tablet Commonly known as: TYLENOL Take 500 mg by mouth every 6 (six) hours as needed for moderate pain.   albuterol 108 (90 Base) MCG/ACT inhaler Commonly known as: VENTOLIN HFA Inhale 1-2 puffs into the lungs every 4 (four) hours as needed for wheezing or shortness of breath.   bismuth subsalicylate 262 MG chewable tablet Commonly known as: PEPTO BISMOL Chew 524 mg by mouth as needed for indigestion or diarrhea or loose stools.   ceFAZolin IVPB Commonly known as: ANCEF Inject 2 g into the vein every 8 (eight) hours. Indication:  MSSA renal abscess/prostate abscess First Dose: Yes Last Day of Therapy:  07/12/23 Labs - Once weekly:  CBC/D and CMP Please pull PIC at completion of IV antibiotics Fax weekly lab results promptly to [phone] Method of administration: IV Push Method of administration may be changed at the discretion of home infusion pharmacist based upon assessment of the patient and/or caregiver's ability to self-administer the medication ordered.   cetirizine 10 MG tablet Commonly known as: ZYRTEC Take 10 mg by mouth daily.   cyanocobalamin 500 MCG tablet Commonly known as: VITAMIN B12 Take 1 tablet (500 mcg total) by mouth daily.   Dextromethorphan-guaiFENesin 10-100 MG/5ML liquid Take 10 mLs by mouth every 6 (six) hours as needed.   docusate sodium 100 MG capsule Commonly known as: COLACE Take 100 mg by mouth 2 (two) times daily.   Eliquis 2.5 MG Tabs tablet Generic drug: apixaban TAKE (1) TABLET BY MOUTH TWICE DAILY.   fluticasone 50 MCG/ACT nasal spray Commonly known as: FLONASE Place 1 spray into both nostrils daily as needed for allergies or rhinitis.   insulin aspart 100 UNIT/ML injection Commonly known as: novoLOG Inject 14 Units into the skin 3 (three) times daily with meals.   insulin  glargine 100 UNIT/ML injection Commonly known as: LANTUS Inject 45 Units into the skin 2 (two) times daily.   meclizine 25 MG tablet Commonly known as: ANTIVERT Take 25 mg by mouth 3 (three) times daily as needed for dizziness.   omeprazole 40 MG capsule Commonly known as: PRILOSEC Take 40 mg by mouth daily as needed (GERD symptoms).   tamsulosin 0.4 MG Caps capsule Commonly known as: FLOMAX Take 1 capsule (0.4 mg total) by mouth daily after supper.               Discharge Care Instructions  (From admission, onward)           Start     Ordered   06/06/23 0000  Change dressing on IV access line weekly and PRN  (Home infusion instructions - [facility] )        06/06/23 1045            Follow-up Information     [name], Earl Lagos, MD. Go in 1 week(s).   Specialty: Family Medicine Why: Appointment time 1 pm with Dr Hessie Diener on 06/13/2023 Contact information: [address] [place] [id number] [phone]         Riki Altes, MD. Go in 16 day(s).   Specialty: Urology Why: appt scheduled for 06/22/2023 at 11am Contact information: [address] [place] [address] [phone]         Lynn Ito, MD Follow up on 07/05/2023.   Specialty: Infectious Diseases Why: 9:00am Contact information: [address] [place] [address] [phone]                Discharge Exam: Ceasar Mons Weights   05/31/23 0643  Weight: 96.2 kg  BP 120/74 (BP Location: Left Arm)   Pulse 85   Temp 97.9 F (36.6 C) (Oral)   Resp 18   Ht 5\' 8"  (1.727 m)   Wt 96.2 kg   SpO2 99%   BMI 32.23 kg/m   No distress, feels well, appears well Clear, nonlabored RRR Soft, NT, ND, +BS, no CVA tenderness  Condition at discharge: stable  The results of significant diagnostics from this hospitalization (including imaging, microbiology, ancillary and laboratory) are listed below for reference.   Imaging  Studies: [place] EKG SITE RITE  Result Date: 06/06/2023 If Site Rite image not attached, placement could not be confirmed due to current cardiac rhythm.  CT ABDOMEN PELVIS W CONTRAST  Result Date: 06/05/2023 CLINICAL DATA:  Pyelonephritis, renal abscess EXAM: CT ABDOMEN AND PELVIS WITH CONTRAST TECHNIQUE: Multidetector CT imaging of the abdomen and pelvis was performed using the standard protocol following bolus administration of intravenous contrast. RADIATION DOSE REDUCTION: This exam was performed according to the departmental dose-optimization program which includes automated exposure control, adjustment of the mA and/or kV according to patient size and/or use of iterative reconstruction technique. CONTRAST:  OMNIPAQUE IOHEXOL 300 MG/ML  SOLN COMPARISON:  05/31/2023 FINDINGS: Lower chest: Mild bibasilar pulmonary fibrotic change. No acute abnormality. Hepatobiliary: No focal liver abnormality is seen. No gallstones, gallbladder wall thickening, or biliary dilatation. Pancreas: Unremarkable Spleen: Unremarkable Adrenals/Urinary Tract: The adrenal glands are unremarkable. Heterogeneous enhancement and perinephric inflammatory stranding and soft tissue within the right posterior pararenal space, best seen on axial image # [id number] has improved in the interval since prior examination with decreasing size and conspicuity of cystic collections within the renal cortex in keeping with resolving renal abscesses and/or phlegmonous change. No discrete loculated fluid collection is identified to suggest AA organized abscess at this time. There is persistent mild asymmetric urothelial enhancement of the right renal collecting system compatible with ascending urinary tract infection. No loculated perinephric fluid collections are seen. No hydronephrosis. No intrarenal or ureteral calculi. The left kidney is unremarkable save for a simple cortical cyst within the lower pole for which no follow-up imaging is recommended.  The bladder is unremarkable. Stomach/Bowel: Mild colonic diverticulosis without superimposed acute inflammatory change. Moderate colonic stool burden without evidence of obstruction. The stomach, small bowel, and large bowel are otherwise unremarkable. The appendix is absent. No free intraperitoneal gas or fluid. Vascular/Lymphatic: Aortic atherosclerosis. No enlarged abdominal or pelvic lymph nodes. Reproductive: The prostate gland is moderately enlarged. Similar prior examination, there are multiple rounded hypoenhancing collections within the inferior peripheral zone suspicious for areas of focal prostatitis or developing prostatic abscesses. Other: Small fat containing umbilical hernia. Mild diffuse subcutaneous body wall edema has progressed in the interval since prior examination in keeping with progressive anasarca. Musculoskeletal: No acute bone abnormality. No lytic or blastic bone lesion. IMPRESSION: 1. Interval improvement in right-sided pyelonephritis and resolving renal abscesses and/or phlegmonous change. No discrete loculated fluid collection is identified to suggest a organized abscess at this time. 2. Persistent mild asymmetric urothelial enhancement of the right renal collecting system compatible with ascending urinary tract infection. 3. Multiple rounded hypoenhancing  collections within the inferior peripheral zone of the prostate gland suspicious for areas of focal prostatitis or developing prostatic abscesses. Correlation for prostate symptoms is recommended. 4. Progressive anasarca. 5. Mild colonic diverticulosis without superimposed acute inflammatory change. 6. Moderate colonic stool burden without evidence of obstruction. Aortic Atherosclerosis (ICD10-I70.0). Electronically Signed   By: Helyn Numbers M.D.   On: 06/05/2023 19:04   ECHOCARDIOGRAM COMPLETE  Result Date: 06/03/2023    ECHOCARDIOGRAM REPORT   Patient Name:   VALERIA KRISKO Date of Exam: 06/03/2023 Medical Rec #:  [id number]      Height:       68.0 in Accession #:    [id number]   Weight:       212.0 lb Date of Birth:  12/01/1960    BSA:          2.095 m Patient Age:    62 years     BP:           151/81 mmHg Patient Gender: M            HR:           92 bpm. Exam Location:  [facility] Procedure: 2D Echo, Cardiac Doppler and Color Doppler Indications:     Abnormal ECG R94.31  History:         Patient has prior history of Echocardiogram examinations, most                  recent 06/06/2016. CAD, Stroke and TIA; Risk Factors:Diabetes,                  Hypertension and Non-Smoker. H/o DVT PE.  Sonographer:     Dondra Prader RVT RCS Referring Phys:  [name] Diagnosing Phys: Alwyn Pea MD IMPRESSIONS  1. Left ventricular ejection fraction, by estimation, is 60 to 65%. The left ventricle has normal function. The left ventricle has no regional wall motion abnormalities. Left ventricular diastolic parameters are consistent with Grade II diastolic dysfunction (pseudonormalization).  2. Right ventricular systolic function is normal. The right ventricular size is normal.  3. The mitral valve is normal in structure. No evidence of mitral valve regurgitation.  4. The aortic valve is normal in structure. Aortic valve regurgitation is not visualized. FINDINGS  Left Ventricle: Left ventricular ejection fraction, by estimation, is 60 to 65%. The left ventricle has normal function. The left ventricle has no regional wall motion abnormalities. The left ventricular internal cavity size was normal in size. There is  no left ventricular hypertrophy. Left ventricular diastolic parameters are consistent with Grade II diastolic dysfunction (pseudonormalization). Right Ventricle: The right ventricular size is normal. No increase in right ventricular wall thickness. Right ventricular systolic function is normal. Left Atrium: Left atrial size was normal in size. Right Atrium: Right atrial size was normal in size. Pericardium: There is no evidence of pericardial  effusion. Mitral Valve: The mitral valve is normal in structure. No evidence of mitral valve regurgitation. Tricuspid Valve: The tricuspid valve is normal in structure. Tricuspid valve regurgitation is trivial. Aortic Valve: The aortic valve is normal in structure. Aortic valve regurgitation is not visualized. Aortic valve mean gradient measures 5.3 mmHg. Aortic valve peak gradient measures 9.7 mmHg. Aortic valve area, by VTI measures 2.82 cm. Pulmonic Valve: The pulmonic valve was normal in structure. Pulmonic valve regurgitation is trivial. Aorta: The aortic arch was not well visualized. IAS/Shunts: No atrial level shunt detected by color flow Doppler.  LEFT VENTRICLE PLAX 2D LVIDd:  4.90 cm   Diastology LVIDs:         3.10 cm   LV e' medial:    8.49 cm/s LV PW:         1.10 cm   LV E/e' medial:  9.1 LV IVS:        0.90 cm   LV e' lateral:   12.70 cm/s LVOT diam:     2.20 cm   LV E/e' lateral: 6.1 LV SV:         79 LV SV Index:   38 LVOT Area:     3.80 cm  RIGHT VENTRICLE             IVC RV S prime:     17.00 cm/s  IVC diam: 1.50 cm TAPSE (M-mode): 2.9 cm LEFT ATRIUM             Index        RIGHT ATRIUM           Index LA diam:        4.10 cm 1.96 cm/m   RA Area:     16.90 cm LA Vol (A2C):   51.2 ml 24.44 ml/m  RA Volume:   47.50 ml  22.67 ml/m LA Vol (A4C):   39.3 ml 18.76 ml/m LA Biplane Vol: 48.6 ml 23.20 ml/m  AORTIC VALVE                     PULMONIC VALVE AV Area (Vmax):    2.84 cm      PV Vmax:        1.04 m/s AV Area (Vmean):   2.84 cm      PV Peak grad:   4.3 mmHg AV Area (VTI):     2.82 cm      RVOT Peak grad: 5 mmHg AV Vmax:           155.33 cm/s AV Vmean:          106.667 cm/s AV VTI:            0.282 m AV Peak Grad:      9.7 mmHg AV Mean Grad:      5.3 mmHg LVOT Vmax:         116.00 cm/s LVOT Vmean:        79.700 cm/s LVOT VTI:          0.209 m LVOT/AV VTI ratio: 0.74  AORTA Ao Root diam: 3.80 cm Ao Asc diam:  3.40 cm MITRAL VALVE MV Area (PHT): 4.80 cm    SHUNTS MV Decel Time: 158  msec    Systemic VTI:  0.21 m MV E velocity: 77.00 cm/s  Systemic Diam: 2.20 cm MV A velocity: 56.60 cm/s MV E/A ratio:  1.36 Dwayne D Callwood MD Electronically signed by Alwyn Pea MD Signature Date/Time: 06/03/2023/6:44:29 PM    Final    MR BRAIN WO CONTRAST  Result Date: 06/02/2023 CLINICAL DATA:  Left facial droop and extremity weakness EXAM: MRI HEAD WITHOUT CONTRAST TECHNIQUE: Multiplanar, multiecho pulse sequences of the brain and surrounding structures were obtained without intravenous contrast. COMPARISON:  Same-day CT head FINDINGS: A truncated protocol including axial and coronal DWI, axial FLAIR, and axial SWI sequences. Brain: There is no acute intracranial hemorrhage, extra-axial fluid collection, or acute infarct. Parenchymal volume is normal. The ventricles are normal in size. Parenchymal signal is normal on the provided sequences. There is no mass lesion.  There is no mass effect  or midline shift. Vascular: Suboptimally assessed on the provided sequences. Skull and upper cervical spine: Suboptimally assessed on the provided sequences. Sinuses/Orbits: No acute finding. Other: The mastoid air cells and middle ear cavities are clear. IMPRESSION: No acute intracranial pathology. Electronically Signed   By: Lesia Hausen M.D.   On: 06/02/2023 17:17   CT HEAD CODE STROKE WO CONTRAST`  Result Date: 06/02/2023 CLINICAL DATA:  Code stroke. Left facial droop and upper and lower extremity weakness. EXAM: CT HEAD WITHOUT CONTRAST TECHNIQUE: Contiguous axial images were obtained from the base of the skull through the vertex without intravenous contrast. RADIATION DOSE REDUCTION: This exam was performed according to the departmental dose-optimization program which includes automated exposure control, adjustment of the mA and/or kV according to patient size and/or use of iterative reconstruction technique. COMPARISON:  CT head 09/19/2022 FINDINGS: Brain: There is no acute intracranial hemorrhage,  extra-axial fluid collection, or acute infarct. Parenchymal volume is normal. The ventricles are normal in size. Gray-white differentiation is preserved. The pituitary and suprasellar region are normal. There is no mass lesion. There is no mass effect or midline shift. Vascular: No hyperdense vessel or unexpected calcification. Skull: Normal. Negative for fracture or focal lesion. Sinuses/Orbits: The paranasal sinuses are clear. The globes and orbits are unremarkable. Other: Multiple left parotid masses are again seen, previously evaluated with biopsy, ultrasound, and CT neck. ASPECTS [facility] Stroke Program Early CT Score) - Ganglionic level infarction (caudate, lentiform nuclei, internal capsule, insula, M1-M3 cortex): 7 - Supraganglionic infarction (M4-M6 cortex): 3 Total score (0-10 with 10 being normal): 10 IMPRESSION: 1. No acute intracranial pathology 2. ASPECTS is 10 Findings communicated via AMION to Dr Pearlean Brownie at 4:34 pm. Electronically Signed   By: Lesia Hausen M.D.   On: 06/02/2023 16:35   US SOFT TISSUE HEAD & NECK (NON-THYROID)  Result Date: 05/31/2023 CLINICAL DATA:  Abscess of parotid masseteric region. EXAM: ULTRASOUND OF HEAD/NECK SOFT TISSUES TECHNIQUE: Ultrasound examination of the head and neck soft tissues was performed in the area of clinical concern. COMPARISON:  Neck CT 09/19/2022 FINDINGS: Hypoechoic masses in the left parotid which are numerous. The most superficial appears solid with branching internal color Doppler flow, up to 2.9 cm in length. This lesion measures larger than the 19 x 12 mm size on prior neck CT. The largest lesion is in the deep parotid and measures up to 3.4 x 2.2 x 2.5 cm (per my measurements) and has less internal blood flow suggesting partially cystic characteristics, which was also seen on prior CT. The patient presents with fever. No clear parotid parenchymal edema or ductal dilatation. IMPRESSION: Multiple left parotid masses, the largest measuring nearly 4  cm with suggested cystic/cavitary component, features and size also seen on 03/02/2023 neck CT. No specific signs of abscess or parotiditis. Electronically Signed   By: Tiburcio Pea M.D.   On: 05/31/2023 12:34   CT ABDOMEN PELVIS W CONTRAST  Result Date: 05/31/2023 CLINICAL DATA:  Abdominal pain that is acute and nonlocalized. EXAM: CT ABDOMEN AND PELVIS WITH CONTRAST TECHNIQUE: Multidetector CT imaging of the abdomen and pelvis was performed using the standard protocol following bolus administration of intravenous contrast. RADIATION DOSE REDUCTION: This exam was performed according to the departmental dose-optimization program which includes automated exposure control, adjustment of the mA and/or kV according to patient size and/or use of iterative reconstruction technique. CONTRAST:  OMNIPAQUE IOHEXOL 300 MG/ML  SOLN COMPARISON:  CT 05/07/2019. FINDINGS: Lower chest: There are peripheral lung base interstitial areas of thickening.  Please correlate for any known history including scarring or fibrotic change. No pleural effusion. Few punctate calcifications as well, possibly related to old granulomatous disease. Hepatobiliary: No focal liver abnormality is seen. No gallstones, gallbladder wall thickening, or biliary dilatation. Patent portal vein Pancreas: Unremarkable. No pancreatic ductal dilatation or surrounding inflammatory changes. Spleen: Normal in size without focal abnormality. Adrenals/Urinary Tract: Adrenal glands are preserved. Left kidney has some mild perinephric stranding, nonspecific. There is a Bosniak 1 midportion cysts, similar to previous a Bosniak 2 lower pole tiny focus. No follow up is recommended of these lesions. No collecting system dilatation of the left with a normal course and caliber of the left ureter down to the bladder. Preserved contours of the urinary bladder. The right kidney has a severe perinephric stranding. There are areas of heterogeneous enhancement of the  right kidney with wedge-shaped like areas as well as some more confluence lower density cystic like areas as well. Example lower pole series 2, image 54 measuring 15 mm, series 2, image 47 posteromedial measuring 18 mm. There is extrarenal pelvis of the right kidney with some urothelial thickening and a UPJ transition. More distal ureter is slightly ectatic with a urothelial thickening and adjacent stranding down to the bladder. Stomach/Bowel: On this non oral contrast exam large bowel has a scattered colonic stool and is nondilated. There is redundant sigmoid colon extending into the midabdomen. Few colonic diverticula. The appendix is not well seen but no pericecal stranding or fluid. Stomach and small bowel are nondilated. Vascular/Lymphatic: Normal caliber aorta and IVC with atherosclerotic changes. There are several small lymph nodes along the retroperitoneum which are not pathologic by size criteria but more numerous than usually seen and could be reactive related to the right renal findings. Reproductive: Preserved seminal vesicles. Enlarged prostate with some low-density areas inferiorly such as series 2, image 94. Please correlate with any prostate symptoms. Other: No free intra-air. Musculoskeletal: Scattered degenerative changes of the spine and pelvis. Transitional lumbosacral segment. IMPRESSION: Heterogeneous enhancement of the right kidney with severe adjacent perinephric stranding. There is also some ill-defined cystic areas in the kidney. There is also urothelial thickening along the course of the right ureter and renal pelvis. Based on appearance this could be sequela of renal infection with pyelonephritis. If so, the cystic areas could represent early abscess formation or phlegmonous change. Recommend close follow-up. Enlarged prostate with some low-density almost cystic like area inferiorly. This is of uncertain etiology and significance. Please correlate with any prostate symptoms. Presumed  multiple reactive retroperitoneal nodes. Diffuse colonic stool without obstruction.  Few colonic diverticula. Electronically Signed   By: Karen Kays M.D.   On: 05/31/2023 10:54    Microbiology: Results for orders placed or performed during the hospital encounter of 05/31/23  Urine Culture     Status: Abnormal   Collection Time: 05/31/23  6:46 AM   Specimen: Urine, Clean Catch  Result Value Ref Range Status   Specimen Description   Final    URINE, CLEAN CATCH Performed at [facility], [address], [place], [place] [address]    Special Requests   Final    NONE Performed at [facility], [address] [place], [place] [address]    Culture >=100,000 COLONIES/mL STAPHYLOCOCCUS AUREUS (A)  Final   Report Status 06/02/2023 FINAL  Final   Organism ID, Bacteria STAPHYLOCOCCUS AUREUS (A)  Final      Susceptibility   Staphylococcus aureus - MIC*    CIPROFLOXACIN <=0.5 SENSITIVE Sensitive  GENTAMICIN <=0.5 SENSITIVE Sensitive     NITROFURANTOIN 32 SENSITIVE Sensitive     OXACILLIN 0.5 SENSITIVE Sensitive     TETRACYCLINE >=16 RESISTANT Resistant     VANCOMYCIN 1 SENSITIVE Sensitive     TRIMETH/SULFA <=10 SENSITIVE Sensitive     CLINDAMYCIN <=0.25 SENSITIVE Sensitive     RIFAMPIN <=0.5 SENSITIVE Sensitive     Inducible Clindamycin NEGATIVE Sensitive     LINEZOLID 2 SENSITIVE Sensitive     * >=100,000 COLONIES/mL STAPHYLOCOCCUS AUREUS  Culture, blood (Routine X 2) w Reflex to ID Panel     Status: None (Preliminary result)   Collection Time: 06/02/23  8:31 AM   Specimen: BLOOD RIGHT HAND  Result Value Ref Range Status   Specimen Description BLOOD RIGHT HAND  Final   Special Requests   Final    BOTTLES DRAWN AEROBIC AND ANAEROBIC Blood Culture results may not be optimal due to an excessive volume of blood received in culture bottles   Culture   Final    NO GROWTH 4 DAYS Performed at [facility], [address] [place], [place] [address]    Report  Status PENDING  Incomplete  Culture, blood (Routine X 2) w Reflex to ID Panel     Status: None (Preliminary result)   Collection Time: 06/02/23  8:37 AM   Specimen: BLOOD LEFT HAND  Result Value Ref Range Status   Specimen Description BLOOD LEFT HAND  Final   Special Requests   Final    BOTTLES DRAWN AEROBIC AND ANAEROBIC Blood Culture results may not be optimal due to an excessive volume of blood received in culture bottles   Culture   Final    NO GROWTH 4 DAYS Performed at [facility], [address] [place], [place] [address]    Report Status PENDING  Incomplete  Urine Culture (for pregnant, neutropenic or urologic patients or patients with an indwelling urinary catheter)     Status: Abnormal   Collection Time: 06/03/23 10:24 PM   Specimen: Urine, Clean Catch  Result Value Ref Range Status   Specimen Description   Final    URINE, CLEAN CATCH Performed at [facility], [address] [place], [place] [address]    Special Requests   Final    NONE Performed at [facility], [address] [place], [place] [address]    Culture MULTIPLE SPECIES PRESENT, SUGGEST RECOLLECTION (A)  Final   Report Status 06/05/2023 FINAL  Final    Labs: CBC: Recent Labs  Lab 05/31/23 0646 06/01/23 0634 06/02/23 0344 06/03/23 0729 06/04/23 1251 06/05/23 0854  WBC 17.6* 16.0* 12.3* 13.0* 11.0* 9.9  NEUTROABS 14.1*  --   --   --   --  5.9  HGB 13.4 12.8* 12.1* 11.8* 12.8* 13.1  HCT 38.1* 37.5* 35.5* 35.1* 38.0* 39.2  MCV 88.4 90.6 91.3 90.5 91.1 90.3  PLT 378 373 361 384 393 425*   Basic Metabolic Panel: Recent Labs  Lab 05/31/23 0646 06/01/23 0634 06/02/23 0344 06/03/23 0729 06/05/23 0854  NA 128* 131* 131* 134* 135  K 3.5 3.5 4.3 3.9 3.8  CL 93* 96* 98 102 98  CO2 25 25 23 24 28   GLUCOSE 414* 231* 415* 371* 142*  BUN 11 13 22 19 16   CREATININE 0.91 0.80 0.91 0.84 0.79  CALCIUM 8.4* 8.1* 8.2* 7.9* 8.5*   Liver Function Tests: Recent Labs  Lab  05/31/23 0646 06/05/23 0854  AST 12* 19  ALT 19 20  ALKPHOS  93 56  BILITOT 1.0 0.4  PROT 7.0 6.1*  ALBUMIN 2.9* 2.5*   CBG: Recent Labs  Lab 06/05/23 0732 06/05/23 1159 06/05/23 1652 06/05/23 2126 06/06/23 0749  GLUCAP 176* 168* 85 200* 212*    Discharge time spent: greater than 30 minutes.  Signed: Tyrone Nine, MD [facility] 06/06/2023

## 2023-06-06 NOTE — Progress Notes (Signed)
PHARMACY CONSULT NOTE FOR:  OUTPATIENT  PARENTERAL ANTIBIOTIC THERAPY (OPAT)  Indication: MSSA renal abscess/prostate abscess Regimen: Cefazolin 2g IV Q8H  End date: 07/12/2023  IV antibiotic discharge orders are pended. To discharging provider:  please sign these orders via discharge navigator,  Select New Orders & click on the button choice - Manage This Unsigned Work.   Labs - Once weekly:  CBC/D and CMP Please pull PIC at completion of IV antibiotics Fax weekly lab results promptly to [phone]  Thank you for allowing pharmacy to be a part of this patient's care.  Effie Shy, PharmD Pharmacy Resident  06/06/2023 9:48 AM

## 2023-06-06 NOTE — TOC Transition Note (Signed)
Transition of Care [facility]) - CM/SW Discharge Note   Patient Details  Name: Michael Gill MRN: [id number] Date of Birth: 08-28-1960  [facility]) CM/SW Contact:  Garret Reddish, RN Phone Number: 06/06/2023, 12:49 PM   Clinical Narrative:    Chart reviewed.   Noted that patient has orders for discharge today.   Pam with [employer] has informed me that she will provide teaching this afternoon for Mr. Dehn.  Patient is awaiting a PICC line.    I have confirmed with Adelina Mings with [facility] that they will provide home health nursing for home infusion.    I have informed staff nurse of the above information.     Final next level of care: Home w Home Health Services Barriers to Discharge: No Barriers Identified   Patient Goals and CMS Choice CMS Medicare.gov Compare Post Acute Care list provided to:: Patient Choice offered to / list presented to : Patient  Discharge Placement                      Patient and family notified of of transfer: 06/06/23  Discharge Plan and Services Additional resources added to the After Visit Summary for                            [facility] Arranged: RN [facility]) [facility] Agency: [facility] ([employer] to provide home infusion services.) Date [facility] Agency Contacted: 06/05/23   Representative spoke with at [facility] Agency: Adelina Mings  Social Determinants of Health (SDOH) Interventions SDOH Screenings   Food Insecurity: No Food Insecurity (05/31/2023)  Housing: Low Risk  (05/31/2023)  Transportation Needs: No Transportation Needs (05/31/2023)  Utilities: Not At Risk (05/31/2023)  Tobacco Use: Low Risk  (05/31/2023)     Readmission Risk Interventions     No data to display

## 2023-06-06 NOTE — Progress Notes (Signed)
Urology Consult Follow Up  Subjective: Patient is resting comfortably with his wife at bedside.  He is no longer experiencing flank pain.  He denied any perineal discomfort or issues with urination.  VSS afebrile  Morning labs not available  Anti-infectives: Anti-infectives (From admission, onward)    Start     Dose/Rate Route Frequency Ordered Stop   06/03/23 2000  ceFAZolin (ANCEF) IVPB 2g/100 mL premix        2 g 200 mL/hr over 30 Minutes Intravenous Every 8 hours 06/03/23 1941     06/03/23 1400  fluconazole (DIFLUCAN) tablet 200 mg  Status:  Discontinued        200 mg Oral Daily 06/03/23 1209 06/04/23 1709   06/02/23 2200  vancomycin (VANCOREADY) IVPB 1250 mg/250 mL  Status:  Discontinued        1,250 mg 166.7 mL/hr over 90 Minutes Intravenous Every 12 hours 06/02/23 0844 06/02/23 1328   06/02/23 2200  ceFAZolin (ANCEF) IVPB 2g/100 mL premix  Status:  Discontinued        2 g 200 mL/hr over 30 Minutes Intravenous Every 8 hours 06/02/23 1328 06/03/23 1941   06/02/23 0945  vancomycin (VANCOREADY) IVPB 1750 mg/350 mL        1,750 mg 175 mL/hr over 120 Minutes Intravenous  Once 06/02/23 0848 06/02/23 1229   06/02/23 0930  vancomycin (VANCOREADY) IVPB 1250 mg/250 mL  Status:  Discontinued        1,250 mg 166.7 mL/hr over 90 Minutes Intravenous Every 12 hours 06/02/23 0843 06/02/23 0844   06/02/23 0930  vancomycin (VANCOREADY) IVPB 2000 mg/400 mL  Status:  Discontinued        2,000 mg 200 mL/hr over 120 Minutes Intravenous  Once 06/02/23 0844 06/02/23 0848   06/01/23 1400  fluconazole (DIFLUCAN) IVPB 200 mg  Status:  Discontinued        200 mg 100 mL/hr over 60 Minutes Intravenous Every 24 hours 06/01/23 1311 06/03/23 1209   06/01/23 1000  cefTRIAXone (ROCEPHIN) 2 g in sodium chloride 0.9 % 100 mL IVPB  Status:  Discontinued        2 g 200 mL/hr over 30 Minutes Intravenous Every 24 hours 05/31/23 1320 06/02/23 1328   05/31/23 0900  cefTRIAXone (ROCEPHIN) 2 g in sodium chloride 0.9  % 100 mL IVPB        2 g 200 mL/hr over 30 Minutes Intravenous  Once 05/31/23 0858 05/31/23 1008       Current Facility-Administered Medications  Medication Dose Route Frequency Provider Last Rate Last Admin   albuterol (PROVENTIL) (2.5 MG/3ML) 0.083% nebulizer solution 3 mL  3 mL Inhalation Q4H PRN Mikey College T, MD       bismuth subsalicylate (PEPTO BISMOL) chewable tablet 524 mg  524 mg Oral PRN Mikey College T, MD       ceFAZolin (ANCEF) IVPB 2g/100 mL premix  2 g Intravenous Q8H Barrie Folk, RPH 200 mL/hr at 06/06/23 0421 2 g at 06/06/23 0421   cyanocobalamin (VITAMIN B12) tablet 500 mcg  500 mcg Oral Daily Regalado, Belkys A, MD   500 mcg at 06/05/23 0935   cyclobenzaprine (FLEXERIL) tablet 5 mg  5 mg Oral TID PRN Mikey College T, MD   5 mg at 06/01/23 0816   docusate sodium (COLACE) capsule 100 mg  100 mg Oral BID Mikey College T, MD   100 mg at 06/04/23 2124   enoxaparin (LOVENOX) injection 47.5 mg  0.5 mg/kg Subcutaneous Q24H Lowella Bandy,  RPH   47.5 mg at 06/05/23 2103   fluticasone (FLONASE) 50 MCG/ACT nasal spray 1 spray  1 spray Each Nare Daily PRN Mikey College T, MD       ibuprofen (ADVIL) tablet 400 mg  400 mg Oral Q6H PRN Mikey College T, MD   400 mg at 06/05/23 1907   insulin aspart (novoLOG) injection 0-20 Units  0-20 Units Subcutaneous TID WC Emeline General, MD   4 Units at 06/05/23 1246   insulin aspart (novoLOG) injection 0-5 Units  0-5 Units Subcutaneous QHS Mikey College T, MD   2 Units at 06/02/23 2209   insulin aspart (novoLOG) injection 14 Units  14 Units Subcutaneous TID WC Mikey College T, MD   14 Units at 06/05/23 1705   insulin aspart (novoLOG) injection 15 Units  15 Units Subcutaneous Once Regalado, Belkys A, MD       insulin glargine-yfgn (SEMGLEE) injection 30 Units  30 Units Subcutaneous QHS Regalado, Belkys A, MD   30 Units at 06/05/23 2101   insulin glargine-yfgn (SEMGLEE) injection 45 Units  45 Units Subcutaneous Daily Mikey College T, MD   45 Units at 06/05/23  0935   meclizine (ANTIVERT) tablet 25 mg  25 mg Oral TID PRN Mikey College T, MD       morphine (PF) 2 MG/ML injection 2 mg  2 mg Intravenous Q2H PRN Mikey College T, MD   2 mg at 06/01/23 1453   ondansetron (ZOFRAN) tablet 4 mg  4 mg Oral Q6H PRN Mikey College T, MD       Or   ondansetron [facility]) injection 4 mg  4 mg Intravenous Q6H PRN Mikey College T, MD       ondansetron (ZOFRAN-ODT) disintegrating tablet 8 mg  8 mg Oral TID Mikey College T, MD   8 mg at 05/31/23 2248   oxyCODONE-acetaminophen (PERCOCET/ROXICET) 5-325 MG per tablet 1 tablet  1 tablet Oral Q6H PRN Mikey College T, MD   1 tablet at 06/05/23 2111   pantoprazole (PROTONIX) EC tablet 40 mg  40 mg Oral Daily Mikey College T, MD   40 mg at 06/05/23 0933   polyethylene glycol (MIRALAX / GLYCOLAX) packet 17 g  17 g Oral BID Regalado, Belkys A, MD   17 g at 06/04/23 0902   senna (SENOKOT) tablet 8.6 mg  1 tablet Oral Daily Regalado, Belkys A, MD   8.6 mg at 06/04/23 0903   tamsulosin (FLOMAX) capsule 0.4 mg  0.4 mg Oral QPC supper Mikey College T, MD   0.4 mg at 06/05/23 1705     Objective: Vital signs in last 24 hours: Temp:  [97.9 F (36.6 C)-98.4 F (36.9 C)] 97.9 F (36.6 C) (11/06 0748) Pulse Rate:  [75-101] 85 (11/06 0748) Resp:  [16-18] 18 (11/06 0748) BP: (102-124)/(71-77) 120/74 (11/06 0748) SpO2:  [97 %-100 %] 99 % (11/06 0748)  Intake/Output from previous day: 11/05 0701 - 11/06 0700 In: 1060 [P.O.:360; IV Piggyback:700] Out: 1525 [Urine:1525] Intake/Output this shift: No intake/output data recorded.   Physical Exam Vitals and nursing note reviewed.  Constitutional:      General: He is not in acute distress.    Appearance: Normal appearance. He is not ill-appearing, toxic-appearing or diaphoretic.  HENT:     Head: Normocephalic and atraumatic.     Nose: Nose normal.     Mouth/Throat:     Mouth: Mucous membranes are moist.     Pharynx: Oropharynx is clear.  Eyes:  Pupils: Pupils are equal, round, and reactive to  light.  Pulmonary:     Effort: Pulmonary effort is normal.  Musculoskeletal:     Cervical back: Normal range of motion.  Skin:    General: Skin is warm.  Neurological:     General: No focal deficit present.     Mental Status: He is alert and oriented to person, place, and time.  Psychiatric:        Mood and Affect: Mood normal.        Thought Content: Thought content normal.        Judgment: Judgment normal.     Lab Results:  Recent Labs    06/04/23 1251 06/05/23 0854  WBC 11.0* 9.9  HGB 12.8* 13.1  HCT 38.0* 39.2  PLT 393 425*   BMET Recent Labs    06/05/23 0854  NA 135  K 3.8  CL 98  CO2 28  GLUCOSE 142*  BUN 16  CREATININE 0.79  CALCIUM 8.5*   PT/INR No results for input(s): "LABPROT", "INR" in the last 72 hours. ABG No results for input(s): "PHART", "HCO3" in the last 72 hours.  Invalid input(s): "PCO2", "PO2"  Studies/Results: [place] EKG SITE RITE  Result Date: 06/06/2023 If Site Rite image not attached, placement could not be confirmed due to current cardiac rhythm.  CT ABDOMEN PELVIS W CONTRAST  Result Date: 06/05/2023 CLINICAL DATA:  Pyelonephritis, renal abscess EXAM: CT ABDOMEN AND PELVIS WITH CONTRAST TECHNIQUE: Multidetector CT imaging of the abdomen and pelvis was performed using the standard protocol following bolus administration of intravenous contrast. RADIATION DOSE REDUCTION: This exam was performed according to the departmental dose-optimization program which includes automated exposure control, adjustment of the mA and/or kV according to patient size and/or use of iterative reconstruction technique. CONTRAST:  OMNIPAQUE IOHEXOL 300 MG/ML  SOLN COMPARISON:  05/31/2023 FINDINGS: Lower chest: Mild bibasilar pulmonary fibrotic change. No acute abnormality. Hepatobiliary: No focal liver abnormality is seen. No gallstones, gallbladder wall thickening, or biliary dilatation. Pancreas: Unremarkable Spleen: Unremarkable Adrenals/Urinary Tract: The  adrenal glands are unremarkable. Heterogeneous enhancement and perinephric inflammatory stranding and soft tissue within the right posterior pararenal space, best seen on axial image # [id number] has improved in the interval since prior examination with decreasing size and conspicuity of cystic collections within the renal cortex in keeping with resolving renal abscesses and/or phlegmonous change. No discrete loculated fluid collection is identified to suggest AA organized abscess at this time. There is persistent mild asymmetric urothelial enhancement of the right renal collecting system compatible with ascending urinary tract infection. No loculated perinephric fluid collections are seen. No hydronephrosis. No intrarenal or ureteral calculi. The left kidney is unremarkable save for a simple cortical cyst within the lower pole for which no follow-up imaging is recommended. The bladder is unremarkable. Stomach/Bowel: Mild colonic diverticulosis without superimposed acute inflammatory change. Moderate colonic stool burden without evidence of obstruction. The stomach, small bowel, and large bowel are otherwise unremarkable. The appendix is absent. No free intraperitoneal gas or fluid. Vascular/Lymphatic: Aortic atherosclerosis. No enlarged abdominal or pelvic lymph nodes. Reproductive: The prostate gland is moderately enlarged. Similar prior examination, there are multiple rounded hypoenhancing collections within the inferior peripheral zone suspicious for areas of focal prostatitis or developing prostatic abscesses. Other: Small fat containing umbilical hernia. Mild diffuse subcutaneous body wall edema has progressed in the interval since prior examination in keeping with progressive anasarca. Musculoskeletal: No acute bone abnormality. No lytic or blastic bone lesion. IMPRESSION: 1. Interval improvement in  right-sided pyelonephritis and resolving renal abscesses and/or phlegmonous change. No discrete loculated fluid  collection is identified to suggest a organized abscess at this time. 2. Persistent mild asymmetric urothelial enhancement of the right renal collecting system compatible with ascending urinary tract infection. 3. Multiple rounded hypoenhancing collections within the inferior peripheral zone of the prostate gland suspicious for areas of focal prostatitis or developing prostatic abscesses. Correlation for prostate symptoms is recommended. 4. Progressive anasarca. 5. Mild colonic diverticulosis without superimposed acute inflammatory change. 6. Moderate colonic stool burden without evidence of obstruction. Aortic Atherosclerosis (ICD10-I70.0). Electronically Signed   By: Helyn Numbers M.D.   On: 06/05/2023 19:04     Assessment: 62 year old male with uncontrolled diabetes admitted with right pyelonephritis with evidence of early abscess formation in the right kidney and possibly inferior inferiorly to the prostate.  On repeat CT performed yesterday, there is no drainable fluid collection located in the right kidney.  In the area of the prostate, there are multiple rounded hypoenhancing collections in the inferior peripheral zone of the prostate gland suspicious for areas of focal prostatitis or developing prostatic abscesses.   These areas are not amenable to TURP and with patient being clinically stable would not recommend drainage of the collections.  Plan: -Agree with IDs plan for cefazolin 2 g IV every 8 hours for 6 weeks -No plans for intervention with either TURP or drainage of possible fluid collections during this admission unless he becomes clinically unstable -Will need urological follow-up as an outpatient with repeat imaging at the end of the 6 weeks of antibiotics    LOS: 6 days    [facility] 06/06/2023

## 2023-06-06 NOTE — Plan of Care (Signed)
IV removed, discharge instructions reviewed and patient to be discharged to home.  PICC line education completed.

## 2023-06-06 NOTE — Plan of Care (Signed)
  Problem: Coping: Goal: Ability to adjust to condition or change in health will improve Outcome: Progressing   Problem: Education: Goal: Knowledge of General Education information will improve Description: Including pain rating scale, medication(s)/side effects and non-pharmacologic comfort measures Outcome: Progressing   Problem: Activity: Goal: Risk for activity intolerance will decrease Outcome: Progressing   Problem: Nutrition: Goal: Adequate nutrition will be maintained Outcome: Progressing   Problem: Coping: Goal: Level of anxiety will decrease Outcome: Progressing   Problem: Elimination: Goal: Will not experience complications related to bowel motility Outcome: Progressing Goal: Will not experience complications related to urinary retention Outcome: Progressing   Problem: Pain Management: Goal: General experience of comfort will improve Outcome: Progressing   Problem: Safety: Goal: Ability to remain free from injury will improve Outcome: Progressing

## 2023-06-06 NOTE — Progress Notes (Signed)
Peripherally Inserted Central Catheter Placement  The IV Nurse has discussed with the patient and/or persons authorized to consent for the patient, the purpose of this procedure and the potential benefits and risks involved with this procedure.  The benefits include less needle sticks, lab draws from the catheter, and the patient may be discharged home with the catheter. Risks include, but not limited to, infection, bleeding, blood clot (thrombus formation), and puncture of an artery; nerve damage and irregular heartbeat and possibility to perform a PICC exchange if needed/ordered by physician.  Alternatives to this procedure were also discussed.  Bard Power PICC patient education guide, fact sheet on infection prevention and patient information card has been provided to patient /or left at bedside.    PICC Placement Documentation  PICC Single Lumen 06/06/23 Right Brachial 40 cm 0 cm (Active)  Indication for Insertion or Continuance of Line Home intravenous therapies (PICC only) 06/06/23 1500  Exposed Catheter (cm) 0 cm 06/06/23 1500  Site Assessment Clean, Dry, Intact 06/06/23 1500  Line Status Flushed;Saline locked;Blood return noted 06/06/23 1500  Dressing Type Transparent;Securing device 06/06/23 1500  Dressing Status Antimicrobial disc in place;Clean, Dry, Intact 06/06/23 1500  Line Care Connections checked and tightened 06/06/23 1500  Line Adjustment (NICU/IV Team Only) No 06/06/23 1500  Dressing Intervention New dressing 06/06/23 1500  Dressing Change Due 06/13/23 06/06/23 1500       Franne Grip Renee 06/06/2023, 3:30 PM

## 2023-06-06 NOTE — Treatment Plan (Signed)
Diagnosis: MSSA renal abscess/prostate abscess Baseline Creatinine    Allergies  Allergen Reactions   Shellfish Allergy Hives    Swelling of throat and mouth. Betadine is NOT a problem   Codeine Other (See Comments)    Reaction:  Hallucinations    Sulfa Antibiotics Hives    OPAT Orders Discharge antibiotics: Cefazolin 2 grams IV every 8 hrs For 6 weeks End date  07/12/23 [facility] Care Per Protocol:  Labs weekly while on IV antibiotics: _X_ CBC with differential  _X_ CMP   _X_ Please pull PIC at completion of IV antibiotics _  Fax weekly lab results  promptly to [phone]  Clinic Follow Up Appt:with Dr.Jamir Rone 07/05/23 at 9am  Call [phone] with ay questions or critical value

## 2023-06-06 NOTE — Care Management Important Message (Signed)
Important Message  Patient Details  Name: Michael Gill MRN: [id number] Date of Birth: [birth year]   Important Message Given:  Yes - Medicare IM     Michael Gill 06/06/2023, 9:48 AM

## 2023-06-07 LAB — CULTURE, BLOOD (ROUTINE X 2)
Culture: NO GROWTH
Culture: NO GROWTH

## 2023-06-11 ENCOUNTER — Other Ambulatory Visit: Payer: Self-pay

## 2023-06-11 ENCOUNTER — Telehealth: Payer: Self-pay

## 2023-06-11 DIAGNOSIS — N12 Tubulo-interstitial nephritis, not specified as acute or chronic: Secondary | ICD-10-CM

## 2023-06-11 MED ORDER — CIPROFLOXACIN HCL 750 MG PO TABS: 750.0000 mg | ORAL_TABLET | Freq: Two times a day (BID) | ORAL | 0 refills | Status: AC

## 2023-06-11 NOTE — Telephone Encounter (Addendum)
Bonita Quin with [facility] [phone] called to report patient's picc was almost out when she went to the home.  I gave verbal order to remove the picc line.   I spoke with patient and he stated he could go to [facility] same day surgery on Wednesday to have the picc line replaced. Patient scheduled with them 06/13/23 @ 10 am. Patient will also have labs drawn when picc is placed.  Patient aware of the appointment.   Per Dr. Drue Second patient can start Cipro 750 mg BID until he restarts IV abx.  Patient aware to start cipro until he restart IV abx. Rx sent. Danyella Mcginty Jonathon Resides, CMA

## 2023-06-13 ENCOUNTER — Ambulatory Visit
Admission: RE | Admit: 2023-06-13 | Discharge: 2023-06-13 | Disposition: A | Discharge status: Home / Self Care | Payer: Medicare HMO | Source: Ambulatory Visit | Attending: Infectious Diseases | Admitting: Infectious Diseases

## 2023-06-13 VITALS — BP 132/71 | HR 58 | Temp 97.0°F | Resp 16

## 2023-06-13 DIAGNOSIS — N412 Abscess of prostate: Secondary | ICD-10-CM | POA: Insufficient documentation

## 2023-06-13 LAB — COMPREHENSIVE METABOLIC PANEL
ALT: 10 U/L (ref 0–44)
AST: 13 U/L — ABNORMAL LOW (ref 15–41)
Albumin: 3 g/dL — ABNORMAL LOW (ref 3.5–5.0)
Alkaline Phosphatase: 56 U/L (ref 38–126)
Anion gap: 9 (ref 5–15)
BUN: 16 mg/dL (ref 8–23)
CO2: 24 mmol/L (ref 22–32)
Calcium: 8.5 mg/dL — ABNORMAL LOW (ref 8.9–10.3)
Chloride: 102 mmol/L (ref 98–111)
Creatinine, Ser: 0.9 mg/dL (ref 0.61–1.24)
GFR, Estimated: >60 mL/min (ref >60)
Glucose, Bld: 186 mg/dL — ABNORMAL HIGH (ref 70–99)
Potassium: 3.7 mmol/L (ref 3.5–5.1)
Sodium: 135 mmol/L (ref 135–145)
Total Bilirubin: 0.5 mg/dL (ref <1.2)
Total Protein: 6.2 g/dL — ABNORMAL LOW (ref 6.5–8.1)

## 2023-06-13 LAB — CBC
HCT: 35.5 % — ABNORMAL LOW (ref 39.0–52.0)
Hemoglobin: 12 g/dL — ABNORMAL LOW (ref 13.0–17.0)
MCH: 30.4 pg (ref 26.0–34.0)
MCHC: 33.8 g/dL (ref 30.0–36.0)
MCV: 89.9 fL (ref 80.0–100.0)
Platelets: 267 10*3/uL (ref 150–400)
RBC: 3.95 MIL/uL — ABNORMAL LOW (ref 4.22–5.81)
RDW: 12.4 % (ref 11.5–15.5)
WBC: 6.3 10*3/uL (ref 4.0–10.5)
nRBC: 0 % (ref 0.0–0.2)

## 2023-06-13 MED ORDER — SODIUM CHLORIDE 0.9% FLUSH: 10.0000 mL | INTRAVENOUS | Status: DC | PRN

## 2023-06-13 MED ORDER — SODIUM CHLORIDE 0.9% FLUSH: 10.0000 mL | Freq: Two times a day (BID) | INTRAVENOUS | Status: DC

## 2023-06-13 MED ORDER — CHLORHEXIDINE GLUCONATE CLOTH 2 % EX PADS: 6.0000 | MEDICATED_PAD | Freq: Every day | CUTANEOUS | Status: DC

## 2023-06-13 NOTE — Progress Notes (Signed)
Peripherally Inserted Central Catheter Placement  The IV Nurse has discussed with the patient and/or persons authorized to consent for the patient, the purpose of this procedure and the potential benefits and risks involved with this procedure.  The benefits include less needle sticks, lab draws from the catheter, and the patient may be discharged home with the catheter. Risks include, but not limited to, infection, bleeding, blood clot (thrombus formation), and puncture of an artery; nerve damage and irregular heartbeat and possibility to perform a PICC exchange if needed/ordered by physician.  Alternatives to this procedure were also discussed.  Bard Power PICC patient education guide, fact sheet on infection prevention and patient information card has been provided to patient /or left at bedside.    PICC Placement Documentation  PICC Single Lumen 06/06/23 Right Brachial 40 cm 0 cm (Active)  Indication for Insertion or Continuance of Line Home intravenous therapies (PICC only) 06/06/23 1500  Exposed Catheter (cm) 0 cm 06/06/23 1500  Site Assessment Clean, Dry, Intact 06/06/23 1500  Line Status Flushed;Saline locked;Blood return noted 06/06/23 1500  Dressing Type Transparent;Securing device 06/06/23 1500  Dressing Status Antimicrobial disc in place;Clean, Dry, Intact 06/06/23 1500  Line Care Connections checked and tightened 06/06/23 1500  Line Adjustment (NICU/IV Team Only) No 06/06/23 1500  Dressing Intervention New dressing 06/06/23 1500  Dressing Change Due 06/13/23 06/06/23 1500     PICC Single Lumen 06/13/23 Right Brachial 39 cm 0 cm (Active)  Indication for Insertion or Continuance of Line Prolonged intravenous therapies 06/13/23 1102  Exposed Catheter (cm) 0 cm 06/13/23 1102  Site Assessment Clean, Dry, Intact 06/13/23 1102  Line Status Flushed;Blood return noted;Saline locked 06/13/23 1102  Dressing Type Transparent 06/13/23 1102  Dressing Status Antimicrobial disc in place 06/13/23  1102  Line Care Connections checked and tightened 06/13/23 1102  Line Adjustment (NICU/IV Team Only) No 06/13/23 1102  Dressing Change Due 06/20/23 06/13/23 1102       Michael Gill 06/13/2023, 11:11 AM

## 2023-06-22 ENCOUNTER — Ambulatory Visit: Payer: Medicare HMO | Admitting: Urology

## 2023-07-05 ENCOUNTER — Inpatient Hospital Stay: Payer: Medicare HMO | Admitting: Infectious Diseases

## 2023-07-11 ENCOUNTER — Telehealth: Payer: Self-pay

## 2023-07-11 NOTE — Telephone Encounter (Signed)
Bonita Quin the home health nurse with [facility] called stating she is home the patient and part of the picc line is broken off. Verbal orders given to [facility] to remove what is left of the picc line. Bonita Quin verbalized understanding.  Pictures below.

## 2023-07-12 ENCOUNTER — Ambulatory Visit: Payer: Medicare HMO | Attending: Infectious Diseases | Admitting: Infectious Diseases

## 2023-07-12 ENCOUNTER — Encounter: Payer: Self-pay | Admitting: Infectious Diseases

## 2023-07-12 ENCOUNTER — Other Ambulatory Visit: Payer: Self-pay | Admitting: Urology

## 2023-07-12 ENCOUNTER — Telehealth: Payer: Self-pay | Admitting: Urology

## 2023-07-12 VITALS — BP 120/74 | HR 97 | Temp 97.4°F | Ht 68.0 in | Wt 218.0 lb

## 2023-07-12 DIAGNOSIS — G4733 Obstructive sleep apnea (adult) (pediatric): Secondary | ICD-10-CM | POA: Insufficient documentation

## 2023-07-12 DIAGNOSIS — N12 Tubulo-interstitial nephritis, not specified as acute or chronic: Secondary | ICD-10-CM | POA: Insufficient documentation

## 2023-07-12 DIAGNOSIS — B9561 Methicillin susceptible Staphylococcus aureus infection as the cause of diseases classified elsewhere: Secondary | ICD-10-CM | POA: Diagnosis not present

## 2023-07-12 DIAGNOSIS — N151 Renal and perinephric abscess: Secondary | ICD-10-CM | POA: Diagnosis not present

## 2023-07-12 DIAGNOSIS — E1165 Type 2 diabetes mellitus with hyperglycemia: Secondary | ICD-10-CM | POA: Diagnosis present

## 2023-07-12 DIAGNOSIS — Z8673 Personal history of transient ischemic attack (TIA), and cerebral infarction without residual deficits: Secondary | ICD-10-CM | POA: Insufficient documentation

## 2023-07-12 DIAGNOSIS — K118 Other diseases of salivary glands: Secondary | ICD-10-CM | POA: Diagnosis not present

## 2023-07-12 DIAGNOSIS — I10 Essential (primary) hypertension: Secondary | ICD-10-CM | POA: Diagnosis not present

## 2023-07-12 DIAGNOSIS — Z794 Long term (current) use of insulin: Secondary | ICD-10-CM | POA: Diagnosis not present

## 2023-07-12 DIAGNOSIS — Z86711 Personal history of pulmonary embolism: Secondary | ICD-10-CM | POA: Insufficient documentation

## 2023-07-12 DIAGNOSIS — N412 Abscess of prostate: Secondary | ICD-10-CM | POA: Insufficient documentation

## 2023-07-12 DIAGNOSIS — Z7901 Long term (current) use of anticoagulants: Secondary | ICD-10-CM | POA: Diagnosis present

## 2023-07-12 DIAGNOSIS — N2889 Other specified disorders of kidney and ureter: Secondary | ICD-10-CM

## 2023-07-12 NOTE — Patient Instructions (Addendum)
You are here for follow up of the renal and prostate abscess due to staph for which you took 6 weeks of IV antibiotic- You have completed rx- follow up with urology for further imaging .

## 2023-07-12 NOTE — Telephone Encounter (Signed)
Schedule follow up visit with [facility] for follow up CT

## 2023-07-12 NOTE — Progress Notes (Signed)
NAME: Michael Gill  DOB: Dec 11, 1960  MRN: [id number]  Date/Time: 07/12/2023 11:49 AM  Subjective:   ?pt is here for follow . After recent  hospital 05/31/23-06/06/23 Michael Gill is a 62 y.o. with a history of  DM poorly controlled Aic 12.6, PE on eliquis , Parotid gland tumor, DM, HTN, CVA, OSA  Presented with rt flank pain and also left parotid gland tenderness UC MSSA Blood culture not sent on admission  CT abdomen showed rt pyelonephritis and questionable renal abscess  phlegmon, prostate abscess due to MSSA. HE was discharged home on IV cefazolin for 6 weeks and has completed 6 weeks of IV antibiotic Pt is doing very well  PICC was removed a day earlier because it snapped in his sleep- so he finished on 12/11 instead of 12/12 He has no flank pain or urinary symptoms No fever or chills    Past Medical History:  Diagnosis Date   Asthma    B12 deficiency    in the past   CAD (coronary artery disease)    s/p MI   Colon cancer (HCC) 1990   Complication of anesthesia    long time to wake up from colonoscopy.   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   Seizure (HCC)    last episode 08/2017. has no warning, will just happen. 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    difficulty forming words at times   Stroke  [facility]) 2012, 03/2018   residual tremor left hand, facial droop   Tremors of nervous system    specifically left hand, since stroke    Past Surgical History:  Procedure Laterality Date   APPENDECTOMY  1971   CARDIAC CATHETERIZATION Left 10/12/2015   Procedure: Left Heart Cath and Coronary Angiography;  Surgeon: Alwyn Pea, MD;  Location: [facility] INVASIVE CV LAB;  Service: Cardiovascular;  Laterality: Left;   TONSILLECTOMY      Social History   Socioeconomic History   Marital status: Married    Spouse name: Lupita Leash   Number of children: Not on file   Years of education: Not on file   Highest education level: Not on file  Occupational History   Occupation: retired    Comment: disabled  Tobacco Use   Smoking status: Never   Smokeless tobacco: Never  Vaping Use   Vaping status: Never Used  Substance and Sexual Activity   Alcohol use: No   Drug use: No   Sexual activity: Not on file  Other Topics Concern   Not on file  Social History Narrative   Not working, disabled. Some deficits d/t prior stroke. History of seizures, MI and TIA   Wife is very supportive  Social Drivers of Corporate investment banker Strain: Not on file  Food Insecurity: No Food Insecurity (05/31/2023)   Hunger Vital Sign    Worried About Running Out of Food in the Last Year: Never true    Ran Out of Food in the Last Year: Never true  Transportation Needs: No Transportation Needs (05/31/2023)   PRAPARE - Administrator, Civil Service (Medical): No    Lack of Transportation (Non-Medical): No  Physical Activity: Not on file  Stress: Not on file  Social Connections: Not on file  Intimate Partner Violence: Not At Risk (05/31/2023)   Humiliation, Afraid, Rape, and Kick questionnaire    Fear of Current or Ex-Partner: No    Emotionally Abused: No    Physically Abused: No    Sexually Abused: No    Family History  Problem Relation Age of Onset   Alzheimer's disease Mother    Congestive  Heart Failure Father    Colon cancer Brother    Allergies  Allergen Reactions   Shellfish Allergy Hives    Swelling of throat and mouth. Betadine is NOT a problem   Codeine Other (See Comments)    Reaction:  Hallucinations    Sulfa Antibiotics Hives   I? Current Outpatient Medications  Medication Sig Dispense Refill   acetaminophen (TYLENOL) 500 MG tablet Take 500 mg by mouth every 6 (six) hours as needed for moderate pain.     albuterol (PROVENTIL HFA;VENTOLIN HFA) 108 (90 Base) MCG/ACT inhaler Inhale 1-2 puffs into the lungs every 4 (four) hours as needed for wheezing or shortness of breath.     bismuth subsalicylate (PEPTO BISMOL) 262 MG chewable tablet Chew 524 mg by mouth as needed for indigestion or diarrhea or loose stools.      cetirizine (ZYRTEC) 10 MG tablet Take 10 mg by mouth daily.     cyanocobalamin (VITAMIN B12) 500 MCG tablet Take 1 tablet (500 mcg total) by mouth daily. 90 tablet 3   Dextromethorphan-guaiFENesin 10-100 MG/5ML liquid Take 10 mLs by mouth every 6 (six) hours as needed.     docusate sodium (COLACE) 100 MG capsule Take 100 mg by mouth 2 (two) times daily.     ELIQUIS 2.5 MG TABS tablet TAKE (1) TABLET BY MOUTH TWICE DAILY. 60 tablet 5   fluticasone (FLONASE) 50 MCG/ACT nasal spray Place 1 spray into both nostrils daily as needed for allergies or rhinitis.     insulin aspart (NOVOLOG) 100 UNIT/ML injection Inject 14 Units into the skin 3 (three) times daily with meals.     insulin glargine (LANTUS) 100 UNIT/ML injection Inject 45 Units into the skin 2 (two) times daily.     meclizine (ANTIVERT) 25 MG tablet Take 25 mg by mouth 3 (three) times daily as needed for dizziness.     omeprazole (PRILOSEC) 40 MG capsule Take 40 mg by mouth daily as needed (GERD symptoms).      tamsulosin (FLOMAX) 0.4 MG CAPS capsule Take 1 capsule (0.4 mg total) by mouth daily after supper. 30 capsule 0   No current facility-administered medications for this visit.     Abtx:   Anti-infectives (From admission, onward)    None       REVIEW OF SYSTEMS:  Const: negative fever, negative chills, negative weight loss Eyes: negative diplopia or visual changes, negative eye pain ENT: negative coryza, negative sore throat Resp: negative cough, hemoptysis, dyspnea Cards: negative for chest pain, palpitations, lower extremity edema GU: negative for frequency, dysuria  and hematuria GI: Negative for abdominal pain, diarrhea, bleeding, constipation Skin: negative for rash and pruritus Heme: negative for easy bruising and gum/nose bleeding MS: negative for myalgias, arthralgias, back pain and muscle weakness Neurolo:negative for headaches, dizziness, vertigo, memory problems  Psych: negative for feelings of anxiety, depression  Endocrine:  diabetes Allergy/Immunology- as above Objective:  VITALS:  BP 120/74   Pulse 97   Temp (!) 97.4 F (36.3 C) (Temporal)   Ht 5\' 8"  (1.727 m)   Wt 218 lb (98.9 kg)   BMI 33.15 kg/m   PHYSICAL EXAM:  General: Alert, cooperative, no distress, appears stated age.  Head: Normocephalic, without obvious abnormality, atraumatic. Eyes: Conjunctivae clear, anicteric sclerae. Pupils are equal ENT Nares normal. No drainage or sinus tenderness. Lips, mucosa, and tongue normal. No Thrush Neck: Supple, symmetrical, no adenopathy, thyroid: non tender no carotid bruit and no JVD. Back: No CVA tenderness. Lungs: Clear to auscultation bilaterally. No Wheezing or Rhonchi. No rales. Heart: Regular rate and rhythm, no murmur, rub or gallop. Abdomen: Soft, non-tender,not distended. Bowel sounds normal. No masses Extremities: atraumatic, no cyanosis. No edema. No clubbing Skin: No rashes or lesions. Or bruising Lymph: Cervical, supraclavicular normal. Neurologic: Grossly non-focal Pertinent Labs 06/27/23 N  I ? Impression/Recommendation ? Rt  pyelonephritis with renal abscess due to MSSA Staph aureus is not an organism to cause primary  UTI but can be primary bacteremia with seeding of the kidney. Unfortunately blood culture was not done for 3 days after starting antibiotic, so we will have to assume he had Bacteremia prostate abscess  Seen by urologist as in patient- no surgical intervention- only antibiotic He has completed 6 weeks of IV cefazolin Totally asymptomatic now  Spoke with urologist and they will see him for follow up   chronic Left parotid mass -   Needs parotidectomy   DM-    HTN-   H/o PE on eliquis ? Discussed the management with aptient and his wife Urology can do repeat imaging and urine culture I needed  Follow with me PRN

## 2023-07-31 NOTE — Telephone Encounter (Signed)
Pt scheduled w/[facility] 08/30/23, CT scan 08/08/23

## 2023-08-08 ENCOUNTER — Ambulatory Visit
Admission: RE | Admit: 2023-08-08 | Discharge: 2023-08-08 | Disposition: A | Discharge status: Home / Self Care | Payer: Medicare HMO | Source: Ambulatory Visit | Attending: Urology | Admitting: Urology

## 2023-08-08 DIAGNOSIS — N2889 Other specified disorders of kidney and ureter: Secondary | ICD-10-CM | POA: Insufficient documentation

## 2023-08-08 DIAGNOSIS — N412 Abscess of prostate: Secondary | ICD-10-CM | POA: Diagnosis present

## 2023-08-08 DIAGNOSIS — N151 Renal and perinephric abscess: Secondary | ICD-10-CM | POA: Insufficient documentation

## 2023-08-08 LAB — POCT I-STAT CREATININE: Creatinine, Ser: 1.1 mg/dL (ref 0.61–1.24)

## 2023-08-08 MED ORDER — IOHEXOL 300 MG/ML  SOLN
100.0000 mL | Freq: Once | INTRAMUSCULAR | Status: AC | PRN
  Administered 2023-08-08: 100 mL via INTRAVENOUS

## 2023-08-13 ENCOUNTER — Encounter (INDEPENDENT_AMBULATORY_CARE_PROVIDER_SITE_OTHER): Payer: Self-pay

## 2023-08-22 ENCOUNTER — Ambulatory Visit (INDEPENDENT_AMBULATORY_CARE_PROVIDER_SITE_OTHER): Payer: Medicare HMO | Admitting: Urology

## 2023-08-22 VITALS — BP 114/79 | HR 102 | Ht 68.0 in | Wt 218.0 lb

## 2023-08-22 DIAGNOSIS — Z125 Encounter for screening for malignant neoplasm of prostate: Secondary | ICD-10-CM

## 2023-08-22 DIAGNOSIS — R9389 Abnormal findings on diagnostic imaging of other specified body structures: Secondary | ICD-10-CM | POA: Diagnosis not present

## 2023-08-22 DIAGNOSIS — N12 Tubulo-interstitial nephritis, not specified as acute or chronic: Secondary | ICD-10-CM

## 2023-08-22 DIAGNOSIS — N419 Inflammatory disease of prostate, unspecified: Secondary | ICD-10-CM

## 2023-08-22 LAB — URINALYSIS, COMPLETE
Bilirubin, UA: NEGATIVE
Ketones, UA: NEGATIVE
Leukocytes,UA: NEGATIVE
Nitrite, UA: NEGATIVE
Protein,UA: NEGATIVE
RBC, UA: NEGATIVE
Specific Gravity, UA: 1.025 (ref 1.005–1.030)
Urobilinogen, Ur: 0.2 mg/dL (ref 0.2–1.0)
pH, UA: 5.5 (ref 5.0–7.5)

## 2023-08-22 LAB — MICROSCOPIC EXAMINATION

## 2023-08-22 NOTE — Progress Notes (Unsigned)
I, Michael Gill, acting as a scribe for Michael Altes, MD., have documented all relevant documentation on the behalf of Michael Altes, MD, as directed by Michael Altes, MD while in the presence of Michael Altes, MD.  08/22/2023 2:52 PM   Michael Gill Apr 27, 1961 [id number]  Referring provider: Oswaldo Conroy, MD [address] [place],  [place] [address]  Chief Complaint  Patient presents with   Other    HPI: Michael KRUMWIEDE is a 63 y.o. male presents and follow-up for recent hospitalization.  Patient was seen in consultation 06/01/2023 for UTI with right pyelonephritis, possible early abscess identified on CT, and possible prostatic abscess on pelvic CT.  He responded well to antibiotic therapy. Urine cx did grow staph aureus He currently has no complaints  Follow-up CT abdomen pelvis without contrast 08/08/2023 showed resolution of his renal findings. There is a 2 cm left lower pole simple cyst and a smaller 5 mm low density lesion statistically consistent with a cyst. The previously noted changes in the prostate had resolved.    PMH: Past Medical History:  Diagnosis Date   Asthma    B12 deficiency    in the past   CAD (coronary artery disease)    s/p MI   Colon cancer (HCC) 1990   Complication of anesthesia    long time to wake up from colonoscopy.   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   Seizure (HCC)    last episode 08/2017. has no warning, will just happen. 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    difficulty forming words at times   Stroke [facility]) 2012, 03/2018   residual tremor left hand, facial droop   Tremors of nervous system    specifically left hand, since stroke    Surgical History: Past Surgical History:  Procedure Laterality Date   APPENDECTOMY  1971   CARDIAC CATHETERIZATION Left 10/12/2015   Procedure: Left Heart Cath and Coronary Angiography;  Surgeon: Alwyn Pea, MD;  Location: [facility] INVASIVE CV LAB;  Service: Cardiovascular;  Laterality: Left;   TONSILLECTOMY      Home Medications:  Allergies as of 08/22/2023       Reactions   Shellfish Allergy Hives   Swelling of throat and mouth. Betadine is NOT a problem   Codeine Other (See Comments)   Reaction:  Hallucinations    Sulfa Antibiotics Hives        Medication List        Accurate as of August 22, 2023  2:52 PM. If you have any questions, ask your nurse or doctor.          acetaminophen 500  MG tablet Commonly known as: TYLENOL Take 500 mg by mouth every 6 (six) hours as needed for moderate pain.   albuterol 108 (90 Base) MCG/ACT inhaler Commonly known as: VENTOLIN HFA Inhale 1-2 puffs into the lungs every 4 (four) hours as needed for wheezing or shortness of breath.   ammonium lactate 12 % cream Commonly known as: AMLACTIN Apply 1 Application topically 2 (two) times daily.   bismuth subsalicylate 262 MG chewable tablet Commonly known as: PEPTO BISMOL Chew 524 mg by mouth as needed for indigestion or diarrhea or loose stools.   cetirizine 10 MG tablet Commonly known as: ZYRTEC Take 10 mg by mouth daily.   cyanocobalamin 500 MCG tablet Commonly known as: VITAMIN B12 Take 1 tablet (500 mcg total) by mouth daily.   Dextromethorphan-guaiFENesin 10-100  MG/5ML liquid Take 10 mLs by mouth every 6 (six) hours as needed.   docusate sodium 100 MG capsule Commonly known as: COLACE Take 100 mg by mouth 2 (two) times daily.   Eliquis 2.5 MG Tabs tablet Generic drug: apixaban TAKE (1) TABLET BY MOUTH TWICE DAILY.   fluticasone 50 MCG/ACT nasal spray Commonly known as: FLONASE Place 1 spray into both nostrils daily as needed for allergies or rhinitis.   FreeStyle Libre 2 Sensor Misc   insulin aspart 100 UNIT/ML injection Commonly known as: novoLOG Inject 14 Units into the skin 3 (three) times daily with meals.   insulin glargine 100 UNIT/ML injection Commonly known as: LANTUS Inject 45 Units into the skin 2 (two) times daily.   meclizine 25 MG tablet Commonly known as: ANTIVERT Take 25 mg by mouth 3 (three) times daily as needed for dizziness.   omeprazole 40 MG capsule Commonly known as: PRILOSEC Take 40 mg by mouth daily as needed (GERD symptoms).   tamsulosin 0.4 MG Caps capsule Commonly known as: FLOMAX Take 1 capsule (0.4 mg total) by mouth daily after supper.        Allergies:  Allergies  Allergen Reactions   Shellfish Allergy Hives    Swelling of throat and mouth. Betadine is NOT a problem   Codeine Other (See Comments)    Reaction:  Hallucinations    Sulfa Antibiotics Hives    Family History: Family History  Problem Relation Age of Onset   Alzheimer's disease Mother    Congestive Heart Failure Father    Colon cancer Brother     Social History:  reports that he has never smoked. He has never used smokeless tobacco. He reports that he does not drink alcohol and does not use drugs.   Physical Exam: BP 114/79   Pulse (!) 102   Ht 5\' 8"  (1.727 m)   Wt 218 lb (98.9 kg)   BMI 33.15 kg/m   Constitutional:  Alert and oriented, No acute distress. HEENT: [place] AT Respiratory: Normal respiratory effort, no increased work of breathing. Psychiatric: Normal mood and affect.   Pertinent Imaging: CTs were  personally reviewed and interpreted.   CT  EXAM: CT ABDOMEN AND PELVIS WITHOUT AND WITH CONTRAST   TECHNIQUE: Multidetector CT imaging of the abdomen and pelvis was performed following the standard protocol before and following the bolus administration of intravenous contrast.   RADIATION DOSE REDUCTION: This exam was performed according to the departmental dose-optimization program which includes automated exposure control, adjustment of the mA and/or kV according to patient size and/or use of iterative reconstruction technique.   CONTRAST:  OMNIPAQUE IOHEXOL 300 MG/ML  SOLN   COMPARISON:  06/05/2023.  FINDINGS: Lower chest: Mild basilar subpleural reticular densities. Heart is enlarged. No pericardial or pleural effusion. Distal esophagus is grossly unremarkable.   Hepatobiliary: No abnormal arterial phase enhancement in the liver. Liver measures 17.7 cm. Liver and gallbladder are otherwise unremarkable. No biliary ductal dilatation.   Pancreas: Negative.   Spleen: Negative.   Adrenals/Urinary Tract: Right kidney is somewhat atrophic and scarred. 2.0 cm slightly exophytic cyst off the lower pole left kidney measures -4 Hounsfield units. 5 mm low-attenuation lesion in the inferior left kidney (11/130), too small to characterize. No specific follow-up necessary. No urinary stones. Ureters are decompressed. Bladder is grossly unremarkable.   Stomach/Bowel: Stomach, small bowel and colon are unremarkable. Appendix is not readily visualized.   Vascular/Lymphatic: Atherosclerotic calcification of the aorta. No pathologically enlarged lymph nodes. Numerous small retroperitoneal lymph nodes are not enlarged by CT size criteria.   Reproductive: Prostate is visualized.   Other: No free fluid.  Small umbilical hernia contains fat.   Musculoskeletal: Degenerative changes in the spine.   IMPRESSION: 1. No acute findings. 2. 2.0 cm left renal cyst. 5 mm  low-attenuation lesion in the inferior left kidney, too small to characterize. No specific follow-up necessary. 3. Mild subpleural reticular densities in the lung bases raise suspicion for interstitial lung disease. If further evaluation is desired, high-resolution CT chest without contrast could be performed. 4.  Aortic atherosclerosis (ICD10-I70.0).     Electronically Signed   By: Leanna Battles M.D.   On: 08/19/2023 14:47   CT  EXAM: CT ABDOMEN AND PELVIS WITH CONTRAST   TECHNIQUE: Multidetector CT imaging of the abdomen and pelvis was performed using the standard protocol following bolus administration of intravenous contrast.   RADIATION DOSE REDUCTION: This exam was performed according to the departmental dose-optimization program which includes automated exposure control, adjustment of the mA and/or kV according to patient size and/or use of iterative reconstruction technique.   CONTRAST:  OMNIPAQUE IOHEXOL 300 MG/ML  SOLN   COMPARISON:  05/31/2023   FINDINGS: Lower chest: Mild bibasilar pulmonary fibrotic change. No acute abnormality.   Hepatobiliary: No focal liver abnormality is seen. No gallstones, gallbladder wall thickening, or biliary dilatation.   Pancreas: Unremarkable   Spleen: Unremarkable   Adrenals/Urinary Tract: The adrenal glands are unremarkable. Heterogeneous enhancement and perinephric inflammatory stranding and soft tissue within the right posterior pararenal space, best seen on axial image # [id number] has improved in the interval since prior examination with decreasing size and conspicuity of cystic collections within the renal cortex in keeping with resolving renal abscesses and/or phlegmonous change. No discrete loculated fluid collection is identified to suggest AA organized abscess at this time. There is persistent mild asymmetric urothelial enhancement of the right renal collecting system compatible with ascending urinary tract  infection. No loculated perinephric fluid collections are seen. No hydronephrosis. No intrarenal or ureteral calculi. The left kidney is unremarkable save for a simple cortical cyst within the lower pole for which no follow-up imaging is recommended. The bladder is unremarkable.   Stomach/Bowel: Mild colonic diverticulosis without superimposed acute inflammatory change. Moderate colonic stool burden without evidence of obstruction. The stomach, small bowel, and large bowel are otherwise unremarkable. The appendix is absent. No free intraperitoneal gas or fluid.   Vascular/Lymphatic: Aortic atherosclerosis. No enlarged abdominal or pelvic lymph nodes.   Reproductive: The prostate gland is moderately enlarged. Similar prior examination, there are multiple rounded hypoenhancing collections within the inferior peripheral zone suspicious for areas of focal prostatitis or developing prostatic abscesses.  Other: Small fat containing umbilical hernia. Mild diffuse subcutaneous body wall edema has progressed in the interval since prior examination in keeping with progressive anasarca.   Musculoskeletal: No acute bone abnormality. No lytic or blastic bone lesion.   IMPRESSION: 1. Interval improvement in right-sided pyelonephritis and resolving renal abscesses and/or phlegmonous change. No discrete loculated fluid collection is identified to suggest a organized abscess at this time. 2. Persistent mild asymmetric urothelial enhancement of the right renal collecting system compatible with ascending urinary tract infection. 3. Multiple rounded hypoenhancing collections within the inferior peripheral zone of the prostate gland suspicious for areas of focal prostatitis or developing prostatic abscesses. Correlation for prostate symptoms is recommended. 4. Progressive anasarca. 5. Mild colonic diverticulosis without superimposed acute inflammatory change. 6. Moderate colonic stool burden  without evidence of obstruction.   Aortic Atherosclerosis (ICD10-I70.0).     Electronically Signed   By: Helyn Numbers M.D.   On: 06/05/2023 19:04   Assessment & Plan:    1. Staph aureus UTI CT findings suspicious for pyelonephritis and possible prostatic abscess. Follow-up CT shows resolution of these findings.  Recommend follow-up UA.   2. Prostate cancer screening We discussed recommendations of prostate cancer screening beginning at age 27, which includes a DRE and a PSA. On review of record, I do not see a PSA. He was agreeable to a PSA, but refused a DRE. ***msg pcp  I have reviewed the above documentation for accuracy and completeness, and I agree with the above.   Michael Altes, MD  [facility] [address], [address] [place], [place] [address] [phone]

## 2023-08-23 ENCOUNTER — Encounter: Payer: Self-pay | Admitting: Urology

## 2023-08-23 LAB — PSA: Prostate Specific Ag, Serum: 0.6 ng/mL (ref 0.0–4.0)

## 2023-08-30 ENCOUNTER — Ambulatory Visit: Payer: Medicare HMO | Admitting: Urology

## 2024-02-25 ENCOUNTER — Ambulatory Visit: Admitting: Podiatry

## 2024-02-28 ENCOUNTER — Ambulatory Visit (INDEPENDENT_AMBULATORY_CARE_PROVIDER_SITE_OTHER): Admitting: Podiatry

## 2024-02-28 ENCOUNTER — Encounter: Payer: Self-pay | Admitting: Podiatry

## 2024-02-28 DIAGNOSIS — E119 Type 2 diabetes mellitus without complications: Secondary | ICD-10-CM | POA: Diagnosis not present

## 2024-02-28 DIAGNOSIS — Z794 Long term (current) use of insulin: Secondary | ICD-10-CM | POA: Diagnosis not present

## 2024-02-28 DIAGNOSIS — M79675 Pain in left toe(s): Secondary | ICD-10-CM

## 2024-02-28 DIAGNOSIS — M2041 Other hammer toe(s) (acquired), right foot: Secondary | ICD-10-CM

## 2024-02-28 DIAGNOSIS — L84 Corns and callosities: Secondary | ICD-10-CM | POA: Diagnosis not present

## 2024-02-28 DIAGNOSIS — M2042 Other hammer toe(s) (acquired), left foot: Secondary | ICD-10-CM

## 2024-02-28 DIAGNOSIS — M79674 Pain in right toe(s): Secondary | ICD-10-CM | POA: Diagnosis not present

## 2024-02-28 DIAGNOSIS — B351 Tinea unguium: Secondary | ICD-10-CM

## 2024-02-28 DIAGNOSIS — M2011 Hallux valgus (acquired), right foot: Secondary | ICD-10-CM

## 2024-02-28 DIAGNOSIS — M2012 Hallux valgus (acquired), left foot: Secondary | ICD-10-CM

## 2024-02-28 NOTE — Progress Notes (Signed)
 Subjective: Michael Gill presents today for diabetic foot evaluation. Chief Complaint  Patient presents with   [name]    Rm2 Diabetic foot care/ Dr. Kandis last visit July 2025   Patient denies any h/o foot wounds.  PCP is Michael Gill, Michael Iles, MD.  Past Medical History:  Diagnosis Date   Asthma    B12 deficiency    in the past   CAD (coronary artery disease)    s/p MI   Colon cancer (HCC) 1990   Complication of anesthesia    long time to wake up from colonoscopy.   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   Seizure (HCC)    last episode 08/2017. has no warning, will just happen. 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    difficulty forming words at times   Stroke [name] 2012, 03/2018   residual tremor left hand, facial droop   Tremors of nervous system    specifically left hand, since stroke    Patient Active Problem List   Diagnosis Date Noted   Prostate abscess 06/06/2023   Acute pyelonephritis 05/31/2023   BPH (benign prostatic hyperplasia) 05/31/2023   Pyelonephritis 05/31/2023   History of pulmonary embolism 08/05/2019   History of colon cancer 08/05/2019   Chronic anticoagulation 08/05/2019   Pulmonary embolism (HCC) 02/25/2019   Sepsis (HCC) 01/30/2019   Deep vein thrombosis (DVT) of popliteal  vein of right lower extremity (HCC) 10/30/2014   TIA (transient ischemic attack) 01/19/2014    Past Surgical History:  Procedure Laterality Date   APPENDECTOMY  1971   CARDIAC CATHETERIZATION Left 10/12/2015   Procedure: Left Heart Cath and Coronary Angiography;  Surgeon: Michael JONETTA Lovelace, MD;  Location: [facility] INVASIVE CV LAB;  Service: Cardiovascular;  Laterality: Left;   TONSILLECTOMY      Current Outpatient Medications on File Prior to Visit  Medication Sig Dispense Refill   acetaminophen  (TYLENOL ) 500 MG tablet Take 500 mg by mouth every 6 (six) hours as needed for moderate pain.     albuterol  (PROVENTIL  HFA;VENTOLIN  HFA) 108 (90 Base) MCG/ACT inhaler Inhale 1-2 puffs into the lungs every 4 (four) hours as needed for wheezing or shortness of breath.     ammonium lactate (AMLACTIN) 12 % cream Apply 1 Application topically 2 (two) times daily.     bismuth  subsalicylate (PEPTO BISMOL) 262 MG chewable tablet Chew 524 mg by mouth as needed for indigestion or diarrhea or loose stools.      cetirizine (ZYRTEC) 10 MG tablet Take 10 mg by mouth daily.     Continuous Glucose Sensor (FREESTYLE LIBRE 2 SENSOR) MISC      cyanocobalamin  (VITAMIN B12) 500 MCG tablet Take 1 tablet (500 mcg total) by  mouth daily. 90 tablet 3   Dextromethorphan-guaiFENesin  10-100 MG/5ML liquid Take 10 mLs by mouth every 6 (six) hours as needed.     docusate sodium  (COLACE) 100 MG capsule Take 100 mg by mouth 2 (two) times daily.     ELIQUIS  2.5 MG TABS tablet TAKE (1) TABLET BY MOUTH TWICE DAILY. 60 tablet 5   fluticasone  (FLONASE ) 50 MCG/ACT nasal spray Place 1 spray into both nostrils daily as needed for allergies or rhinitis.     insulin  aspart (NOVOLOG ) 100 UNIT/ML injection Inject 14 Units into the skin 3 (three) times daily with meals.     insulin  glargine (LANTUS ) 100 UNIT/ML injection Inject 45 Units into the skin 2 (two) times daily.     meclizine  (ANTIVERT ) 25 MG tablet Take 25 mg by mouth 3 (three) times daily  as needed for dizziness.     omeprazole (PRILOSEC) 40 MG capsule Take 40 mg by mouth daily as needed (GERD symptoms).      tamsulosin  (FLOMAX ) 0.4 MG CAPS capsule Take 1 capsule (0.4 mg total) by mouth daily after supper. 30 capsule 0   No current facility-administered medications on file prior to visit.     Allergies  Allergen Reactions   Shellfish Allergy Hives    Swelling of throat and mouth. Betadine is NOT a problem   Codeine Other (See Comments)    Reaction:  Hallucinations    Sulfa Antibiotics Hives    Social History   Occupational History   Occupation: retired    Comment: disabled  Tobacco Use   Smoking status: Never   Smokeless tobacco: Never  Vaping Use   Vaping status: Never Used  Substance and Sexual Activity   Alcohol use: No   Drug use: No   Sexual activity: Not on file    Family History  Problem Relation Age of Onset   Alzheimer's disease Mother    Congestive Heart Failure Father    Colon cancer Brother     There is no immunization history for the selected administration types on file for this patient.  Objective: There were no vitals filed for this visit.  Michael Gill is a pleasant 63 y.o. male obese in NAD. AAO X 3.   Diabetic foot exam was performed with the following findings:   Vascular Examination: CFT less than 3 seconds. DP pulses palpable b/l. PT pulses nonpalpable b/l. Digital hair absent. Skin temperature gradient warm to warn b/l. No ischemia or gangrene. No cyanosis or clubbing noted b/l. Evidence of skin changes consistent with long term venous stasis BLE. Evidence of chronic venous insufficiency b/l LE.   Neurological Examination: Sensation grossly intact b/l with 10 gram monofilament.   Dermatological Examination: Pedal skin thin, shiny and atrophic b/l. No open wounds. No interdigital macerations.   Toenails 1-5 b/l thick, discolored, elongated with subungual debris and pain on dorsal palpation.   Hyperkeratotic lesion(s)  submet head 1 left foot and submet head 2 left foot.  No erythema, no edema, no drainage, no fluctuance.  Musculoskeletal Examination: Muscle strength 5/5 to all lower extremity muscle groups bilaterally. HAV with bunion bilaterally and hammertoes 2-5 b/l.  Radiographs: None      Lab Results  Component Value Date   HGBA1C 12.6 (H) 05/31/2023   Assessment: 1. Pain due to onychomycosis of toenails of both feet   2. Callus   3. Hallux valgus, acquired, bilateral   4. Acquired hammertoes of both feet   5. Type 2 diabetes mellitus without complication, with  long-term current use of insulin  (HCC)   6. Encounter for diabetic foot exam (HCC)      ADA Risk Categorization: Low Risk:  Patient has all of the following: Intact protective sensation No prior foot ulcer  No severe deformity Pedal pulses present  Plan: Diabetic foot examination performed today. All patient's and/or POA's questions/concerns addressed on today's visit. Toenails 1-5 debrided in length and girth without incident. Callus(es) submet head 1 left foot and submet head 2 left foot pared with sharp debridement without incident. Continue daily foot inspections and monitor blood glucose per PCP/Endocrinologist's recommendations.Continue soft, supportive shoe gear daily. Report any pedal injuries to medical professional. Call office if there are any questions/concerns. Return in about 3 months (around 05/30/2024).  Delon LITTIE Merlin, DPM      [place] LOCATION: [address], [place] [address]                   Office [phone]   [facility] LOCATION: [address] [place], [place] [address] Office [phone]

## 2024-02-28 NOTE — Patient Instructions (Signed)

## 2024-03-06 ENCOUNTER — Emergency Department

## 2024-03-06 ENCOUNTER — Other Ambulatory Visit: Payer: Self-pay

## 2024-03-06 ENCOUNTER — Inpatient Hospital Stay
Admission: EM | Admit: 2024-03-06 | Discharge: 2024-03-09 | DRG: 872 | Disposition: A | Discharge status: Home / Self Care | Attending: Internal Medicine | Admitting: Internal Medicine

## 2024-03-06 DIAGNOSIS — Z8249 Family history of ischemic heart disease and other diseases of the circulatory system: Secondary | ICD-10-CM

## 2024-03-06 DIAGNOSIS — E785 Hyperlipidemia, unspecified: Secondary | ICD-10-CM | POA: Diagnosis present

## 2024-03-06 DIAGNOSIS — I252 Old myocardial infarction: Secondary | ICD-10-CM

## 2024-03-06 DIAGNOSIS — Z794 Long term (current) use of insulin: Secondary | ICD-10-CM

## 2024-03-06 DIAGNOSIS — I82431 Acute embolism and thrombosis of right popliteal vein: Secondary | ICD-10-CM | POA: Diagnosis present

## 2024-03-06 DIAGNOSIS — G459 Transient cerebral ischemic attack, unspecified: Secondary | ICD-10-CM | POA: Diagnosis present

## 2024-03-06 DIAGNOSIS — Z85038 Personal history of other malignant neoplasm of large intestine: Secondary | ICD-10-CM

## 2024-03-06 DIAGNOSIS — J45909 Unspecified asthma, uncomplicated: Secondary | ICD-10-CM | POA: Diagnosis present

## 2024-03-06 DIAGNOSIS — Z923 Personal history of irradiation: Secondary | ICD-10-CM

## 2024-03-06 DIAGNOSIS — E119 Type 2 diabetes mellitus without complications: Secondary | ICD-10-CM

## 2024-03-06 DIAGNOSIS — Z86718 Personal history of other venous thrombosis and embolism: Secondary | ICD-10-CM

## 2024-03-06 DIAGNOSIS — I2510 Atherosclerotic heart disease of native coronary artery without angina pectoris: Secondary | ICD-10-CM | POA: Diagnosis present

## 2024-03-06 DIAGNOSIS — Z79899 Other long term (current) drug therapy: Secondary | ICD-10-CM

## 2024-03-06 DIAGNOSIS — Z86711 Personal history of pulmonary embolism: Secondary | ICD-10-CM

## 2024-03-06 DIAGNOSIS — R52 Pain, unspecified: Secondary | ICD-10-CM | POA: Diagnosis present

## 2024-03-06 DIAGNOSIS — Z7901 Long term (current) use of anticoagulants: Secondary | ICD-10-CM

## 2024-03-06 DIAGNOSIS — K219 Gastro-esophageal reflux disease without esophagitis: Secondary | ICD-10-CM | POA: Diagnosis present

## 2024-03-06 DIAGNOSIS — N12 Tubulo-interstitial nephritis, not specified as acute or chronic: Secondary | ICD-10-CM | POA: Diagnosis present

## 2024-03-06 DIAGNOSIS — Z820 Family history of epilepsy and other diseases of the nervous system: Secondary | ICD-10-CM

## 2024-03-06 DIAGNOSIS — A419 Sepsis, unspecified organism: Secondary | ICD-10-CM | POA: Diagnosis present

## 2024-03-06 DIAGNOSIS — A4159 Other Gram-negative sepsis: Secondary | ICD-10-CM | POA: Diagnosis not present

## 2024-03-06 DIAGNOSIS — E1165 Type 2 diabetes mellitus with hyperglycemia: Secondary | ICD-10-CM | POA: Diagnosis present

## 2024-03-06 DIAGNOSIS — Z800 Family history of malignant neoplasm of digestive organs: Secondary | ICD-10-CM

## 2024-03-06 DIAGNOSIS — Z87442 Personal history of urinary calculi: Secondary | ICD-10-CM

## 2024-03-06 DIAGNOSIS — I69328 Other speech and language deficits following cerebral infarction: Secondary | ICD-10-CM

## 2024-03-06 DIAGNOSIS — N400 Enlarged prostate without lower urinary tract symptoms: Secondary | ICD-10-CM | POA: Diagnosis present

## 2024-03-06 DIAGNOSIS — N10 Acute tubulo-interstitial nephritis: Secondary | ICD-10-CM | POA: Diagnosis present

## 2024-03-06 DIAGNOSIS — N3090 Cystitis, unspecified without hematuria: Principal | ICD-10-CM

## 2024-03-06 DIAGNOSIS — Z885 Allergy status to narcotic agent status: Secondary | ICD-10-CM

## 2024-03-06 LAB — URINALYSIS, ROUTINE W REFLEX MICROSCOPIC
Bilirubin Urine: NEGATIVE
Glucose, UA: >=500 mg/dL — AB
Ketones, ur: 5 mg/dL — AB
Nitrite: NEGATIVE
Protein, ur: 100 mg/dL — AB
Specific Gravity, Urine: 1.027 (ref 1.005–1.030)
Squamous Epithelial / HPF: 0 /HPF (ref 0–5)
WBC, UA: >50 WBC/hpf (ref 0–5)
pH: 5 (ref 5.0–8.0)

## 2024-03-06 LAB — CBC
HCT: 42.5 % (ref 39.0–52.0)
Hemoglobin: 14.8 g/dL (ref 13.0–17.0)
MCH: 30.6 pg (ref 26.0–34.0)
MCHC: 34.8 g/dL (ref 30.0–36.0)
MCV: 88 fL (ref 80.0–100.0)
Platelets: 166 K/uL (ref 150–400)
RBC: 4.83 MIL/uL (ref 4.22–5.81)
RDW: 12.3 % (ref 11.5–15.5)
WBC: 16.4 K/uL — ABNORMAL HIGH (ref 4.0–10.5)
nRBC: 0 % (ref 0.0–0.2)

## 2024-03-06 LAB — COMPREHENSIVE METABOLIC PANEL WITH GFR
ALT: 14 U/L (ref 0–44)
AST: 14 U/L — ABNORMAL LOW (ref 15–41)
Albumin: 3.4 g/dL — ABNORMAL LOW (ref 3.5–5.0)
Alkaline Phosphatase: 56 U/L (ref 38–126)
Anion gap: 11 (ref 5–15)
BUN: 16 mg/dL (ref 8–23)
CO2: 23 mmol/L (ref 22–32)
Calcium: 8.9 mg/dL (ref 8.9–10.3)
Chloride: 98 mmol/L (ref 98–111)
Creatinine, Ser: 0.96 mg/dL (ref 0.61–1.24)
GFR, Estimated: >60 mL/min (ref >60)
Glucose, Bld: 265 mg/dL — ABNORMAL HIGH (ref 70–99)
Potassium: 3.6 mmol/L (ref 3.5–5.1)
Sodium: 132 mmol/L — ABNORMAL LOW (ref 135–145)
Total Bilirubin: 2.2 mg/dL — ABNORMAL HIGH (ref 0.0–1.2)
Total Protein: 6.4 g/dL — ABNORMAL LOW (ref 6.5–8.1)

## 2024-03-06 LAB — GLUCOSE, CAPILLARY
Glucose-Capillary: 306 mg/dL — ABNORMAL HIGH (ref 70–99)
Glucose-Capillary: 246 mg/dL — ABNORMAL HIGH (ref 70–99)

## 2024-03-06 LAB — LIPASE, BLOOD: Lipase: 27 U/L (ref 11–51)

## 2024-03-06 MED ORDER — ONDANSETRON HCL 4 MG PO TABS: 4.0000 mg | ORAL_TABLET | Freq: Four times a day (QID) | ORAL | Status: DC | PRN

## 2024-03-06 MED ORDER — APIXABAN 2.5 MG PO TABS
2.5000 mg | ORAL_TABLET | Freq: Two times a day (BID) | ORAL | Status: DC
  Administered 2024-03-06 – 2024-03-09 (×6): 2.5 mg via ORAL
  Filled 2024-03-06 (×6): qty 1

## 2024-03-06 MED ORDER — ACETAMINOPHEN 325 MG PO TABS
650.0000 mg | ORAL_TABLET | Freq: Four times a day (QID) | ORAL | Status: DC | PRN
  Administered 2024-03-06 – 2024-03-07 (×2): 650 mg via ORAL
  Filled 2024-03-06 (×2): qty 2

## 2024-03-06 MED ORDER — INSULIN ASPART 100 UNIT/ML IJ SOLN
0.0000 [IU] | Freq: Three times a day (TID) | INTRAMUSCULAR | Status: DC
  Administered 2024-03-06: 11 [IU] via SUBCUTANEOUS
  Administered 2024-03-07: 5 [IU] via SUBCUTANEOUS
  Administered 2024-03-07: 8 [IU] via SUBCUTANEOUS
  Administered 2024-03-07: 5 [IU] via SUBCUTANEOUS
  Administered 2024-03-08 – 2024-03-09 (×4): 3 [IU] via SUBCUTANEOUS
  Filled 2024-03-06 (×8): qty 1

## 2024-03-06 MED ORDER — FLUTICASONE PROPIONATE 50 MCG/ACT NA SUSP: 1.0000 | Freq: Every day | NASAL | Status: DC | PRN

## 2024-03-06 MED ORDER — HYDROMORPHONE HCL 1 MG/ML IJ SOLN
1.0000 mg | Freq: Once | INTRAMUSCULAR | Status: AC
  Administered 2024-03-06: 1 mg via INTRAVENOUS
  Filled 2024-03-06: qty 1

## 2024-03-06 MED ORDER — SODIUM CHLORIDE 0.9 % IV SOLN
2.0000 g | Freq: Once | INTRAVENOUS | Status: AC
  Administered 2024-03-06: 2 g via INTRAVENOUS
  Filled 2024-03-06: qty 20

## 2024-03-06 MED ORDER — PANTOPRAZOLE SODIUM 40 MG PO TBEC: 80.0000 mg | DELAYED_RELEASE_TABLET | Freq: Every day | ORAL | Status: DC | PRN

## 2024-03-06 MED ORDER — CYANOCOBALAMIN 500 MCG PO TABS
500.0000 ug | ORAL_TABLET | Freq: Every day | ORAL | Status: DC
  Administered 2024-03-07 – 2024-03-09 (×3): 500 ug via ORAL
  Filled 2024-03-06 (×3): qty 1

## 2024-03-06 MED ORDER — GUAIFENESIN-DM 100-10 MG/5ML PO SYRP: 10.0000 mL | ORAL_SOLUTION | Freq: Four times a day (QID) | ORAL | Status: DC | PRN

## 2024-03-06 MED ORDER — INSULIN GLARGINE-YFGN 100 UNIT/ML ~~LOC~~ SOLN
45.0000 [IU] | Freq: Two times a day (BID) | SUBCUTANEOUS | Status: DC
  Administered 2024-03-06 – 2024-03-09 (×6): 45 [IU] via SUBCUTANEOUS
  Filled 2024-03-06 (×7): qty 0.45

## 2024-03-06 MED ORDER — DOCUSATE SODIUM 100 MG PO CAPS
100.0000 mg | ORAL_CAPSULE | Freq: Two times a day (BID) | ORAL | Status: DC
  Administered 2024-03-06 – 2024-03-09 (×6): 100 mg via ORAL
  Filled 2024-03-06 (×6): qty 1

## 2024-03-06 MED ORDER — LACTATED RINGERS IV SOLN: INTRAVENOUS | Status: AC

## 2024-03-06 MED ORDER — ACETAMINOPHEN 650 MG RE SUPP
650.0000 mg | Freq: Four times a day (QID) | RECTAL | Status: DC | PRN
Start: 2024-03-06 — End: 2024-03-09

## 2024-03-06 MED ORDER — IOHEXOL 300 MG/ML  SOLN
100.0000 mL | Freq: Once | INTRAMUSCULAR | Status: AC | PRN
  Administered 2024-03-06: 100 mL via INTRAVENOUS

## 2024-03-06 MED ORDER — SODIUM CHLORIDE 0.9 % IV BOLUS
1000.0000 mL | Freq: Once | INTRAVENOUS | Status: AC
Start: 2024-03-06 — End: 2024-03-06
  Administered 2024-03-06: 1000 mL via INTRAVENOUS

## 2024-03-06 MED ORDER — MORPHINE SULFATE (PF) 4 MG/ML IV SOLN
4.0000 mg | INTRAVENOUS | Status: AC | PRN
  Administered 2024-03-06: 4 mg via INTRAVENOUS
  Filled 2024-03-06: qty 1

## 2024-03-06 MED ORDER — SODIUM CHLORIDE 0.9 % IV SOLN
2.0000 g | INTRAVENOUS | Status: DC
  Administered 2024-03-07 – 2024-03-09 (×3): 2 g via INTRAVENOUS
  Filled 2024-03-06 (×3): qty 20

## 2024-03-06 MED ORDER — ONDANSETRON HCL 4 MG/2ML IJ SOLN
4.0000 mg | Freq: Once | INTRAMUSCULAR | Status: AC
Start: 2024-03-06 — End: 2024-03-06
  Administered 2024-03-06: 4 mg via INTRAVENOUS
  Filled 2024-03-06: qty 2

## 2024-03-06 MED ORDER — INSULIN ASPART 100 UNIT/ML IJ SOLN
0.0000 [IU] | Freq: Every day | INTRAMUSCULAR | Status: DC
  Administered 2024-03-06 – 2024-03-07 (×2): 2 [IU] via SUBCUTANEOUS
  Administered 2024-03-08: 3 [IU] via SUBCUTANEOUS
  Filled 2024-03-06 (×3): qty 1

## 2024-03-06 MED ORDER — HYDROMORPHONE HCL 1 MG/ML IJ SOLN
0.5000 mg | INTRAMUSCULAR | Status: AC | PRN
  Administered 2024-03-06: 0.5 mg via INTRAVENOUS
  Filled 2024-03-06: qty 0.5

## 2024-03-06 MED ORDER — ALBUTEROL SULFATE (2.5 MG/3ML) 0.083% IN NEBU: 2.5000 mg | INHALATION_SOLUTION | RESPIRATORY_TRACT | Status: DC | PRN

## 2024-03-06 MED ORDER — SENNOSIDES-DOCUSATE SODIUM 8.6-50 MG PO TABS: 1.0000 | ORAL_TABLET | Freq: Every evening | ORAL | Status: DC | PRN

## 2024-03-06 MED ORDER — MECLIZINE HCL 25 MG PO TABS
25.0000 mg | ORAL_TABLET | Freq: Three times a day (TID) | ORAL | Status: DC | PRN
  Administered 2024-03-06 – 2024-03-07 (×2): 25 mg via ORAL
  Filled 2024-03-06 (×3): qty 1

## 2024-03-06 MED ORDER — ONDANSETRON HCL 4 MG/2ML IJ SOLN
4.0000 mg | Freq: Four times a day (QID) | INTRAMUSCULAR | Status: DC | PRN
  Administered 2024-03-06 – 2024-03-09 (×7): 4 mg via INTRAVENOUS
  Filled 2024-03-06 (×7): qty 2

## 2024-03-06 NOTE — Plan of Care (Signed)
  Problem: Education: Goal: Knowledge of General Education information will improve Description: Including pain rating scale, medication(s)/side effects and non-pharmacologic comfort measures Outcome: Progressing   Problem: Health Behavior/Discharge Planning: Goal: Ability to manage health-related needs will improve Outcome: Progressing   Problem: Clinical Measurements: Goal: Ability to maintain clinical measurements within normal limits will improve Outcome: Progressing Goal: Will remain free from infection Outcome: Progressing Goal: Diagnostic test results will improve Outcome: Progressing Goal: Respiratory complications will improve Outcome: Progressing Goal: Cardiovascular complication will be avoided Outcome: Progressing   Problem: Activity: Goal: Risk for activity intolerance will decrease Outcome: Progressing   Problem: Nutrition: Goal: Adequate nutrition will be maintained Outcome: Progressing   Problem: Coping: Goal: Level of anxiety will decrease Outcome: Progressing   Problem: Elimination: Goal: Will not experience complications related to bowel motility Outcome: Progressing Goal: Will not experience complications related to urinary retention Outcome: Progressing   Problem: Pain Managment: Goal: General experience of comfort will improve and/or be controlled Outcome: Progressing   Problem: Safety: Goal: Ability to remain free from injury will improve Outcome: Progressing   Problem: Skin Integrity: Goal: Risk for impaired skin integrity will decrease Outcome: Progressing   Problem: Education: Goal: Ability to describe self-care measures that may prevent or decrease complications (Diabetes Survival Skills Education) will improve Outcome: Progressing Goal: Individualized Educational Video(s) Outcome: Progressing   Problem: Coping: Goal: Ability to adjust to condition or change in health will improve Outcome: Progressing   Problem: Fluid  Volume: Goal: Ability to maintain a balanced intake and output will improve Outcome: Progressing   Problem: Health Behavior/Discharge Planning: Goal: Ability to identify and utilize available resources and services will improve Outcome: Progressing Goal: Ability to manage health-related needs will improve Outcome: Progressing   Problem: Metabolic: Goal: Ability to maintain appropriate glucose levels will improve Outcome: Progressing   Problem: Nutritional: Goal: Maintenance of adequate nutrition will improve Outcome: Progressing Goal: Progress toward achieving an optimal weight will improve Outcome: Progressing   Problem: Skin Integrity: Goal: Risk for impaired skin integrity will decrease Outcome: Progressing   Problem: Tissue Perfusion: Goal: Adequacy of tissue perfusion will improve Outcome: Progressing   Problem: Urinary Elimination: Goal: Signs and symptoms of infection will decrease Outcome: Progressing

## 2024-03-06 NOTE — Assessment & Plan Note (Signed)
 Versus acute cystitis Continue with ceftriaxone  2 g IV daily to complete a 5-day course

## 2024-03-06 NOTE — Assessment & Plan Note (Signed)
 Home long-acting insulin  equivalent, 45 units twice daily resumed Insulin  SSI with at bedtime coverage ordered Goal inpatient blood glucose levels 140-180

## 2024-03-06 NOTE — Hospital Course (Signed)
 Mr. Michael Gill is a 63 year old male with history of right renal atrophy, GERD, hyperlipidemia, history of PE and DVT on Eliquis , history of CVA, insulin -dependent diabetes mellitus, history of seizure and pseudoseizure, conversion disorder, who presents ED for chief concerns of low back pain for several days.  Patient endorses associated nausea, vomiting, abdominal pain, decreased urine output.  Vitals in the ED showed t of 97.8, rr 20, hr 101, blood pressure 130/86, SpO2 98% on room air.  Serum sodium is 132, potassium 3.6, chloride 98, bicarb 23, BUN of 16, serum creatinine 0.96, EGFR greater than 60, nonfasting glucose 265, WBC 16.4, hemoglobin 14.8, platelets of 166.  AST/ALT were 14.  T. bili was mildly elevated at 2.2.  Lipase was 27.  UA was positive for large leukocytes and negative for nitrates.  Glucose was greater than or equal to 600.  ED treatment: Dilaudid  1 mg IV x 2, ondansetron  4 mg IV, ceftriaxone  2 g IV, sodium chloride  1 L bolus.

## 2024-03-06 NOTE — Assessment & Plan Note (Signed)
 Secondary to acute pyelonephritis/acute cystitis Symptomatic support: Acetaminophen  650 mg p.o./rectal every 6 hours.  For mild pain, fever; morphine  4 mg IV every 4 hours as needed for moderate pain, 20 hours of coverage ordered; Dilaudid  0.5 mg IV every 4 hours as needed for severe pain, 20 hours of coverage ordered Lactated Ringer's infusion at 125 mL/h, 1 day ordered

## 2024-03-06 NOTE — Assessment & Plan Note (Signed)
-   Resumed home apixaban 2.5 mg p.o. twice daily

## 2024-03-06 NOTE — H&P (Signed)
 History and Physical   Michael Gill FMW:[id number] DOB: 12-06-1960 DOA: 03/06/2024  PCP: Kandis [name], MD  Outpatient Specialists: Dr. Fayette, infectious disease specialist Patient coming from: Home via POV  I have personally briefly reviewed patient's old medical records in [facility] EMR.  Chief Concern: Abdominal pain, low back pain, nausea, vomiting  HPI: Mr. Michael Gill is a 63 year old male with history of right renal atrophy, GERD, hyperlipidemia, history of PE and DVT on Eliquis , history of CVA, insulin -dependent diabetes mellitus, history of seizure and pseudoseizure, conversion disorder, who presents ED for chief concerns of low back pain for several days.  Patient endorses associated nausea, vomiting, abdominal pain, decreased urine output.  Vitals in the ED showed t of 97.8, rr 20, hr 101, blood pressure 130/86, SpO2 98% on room air.  Serum sodium is 132, potassium 3.6, chloride 98, bicarb 23, BUN of 16, serum creatinine 0.96, EGFR greater than 60, nonfasting glucose 265, WBC 16.4, hemoglobin 14.8, platelets of 166.  AST/ALT were 14.  T. bili was mildly elevated at 2.2.  Lipase was 27.  UA was positive for large leukocytes and negative for nitrates.  Glucose was greater than or equal to 600.  ED treatment: Dilaudid  1 mg IV x 2, ondansetron  4 mg IV, ceftriaxone  2 g IV, sodium chloride  1 L bolus. --------------------------------- At bedside, patient able to tell me his first and last name, age, location, current calendar year.  He reports he has been having abdominal pain, back pain for 3 days.  He endorses burning with urination for 3 days as well.  He endorses subjective fever and chills though he did not check his temperature.  He denies blood in his urine.  He endorses dark-colored urine.  He denies chest pain, shortness of breath, diarrhea, blood in his stool.  He denies trauma to his person.  Reports this feels similar to prior episodes of urinary tract  infection.  Social history: He lives at home with his wife.  He denies tobacco, EtOH, recreational drug use.  He is retired.  ROS: Constitutional: no weight change, no fever ENT/Mouth: no sore throat, no rhinorrhea Eyes: no eye pain, no vision changes Cardiovascular: no chest pain, no dyspnea,  no edema, no palpitations Respiratory: no cough, no sputum, no wheezing Gastrointestinal: no nausea, no vomiting, no diarrhea, no constipation, + abdominal pain Genitourinary: no urinary incontinence, + dysuria, no hematuria Musculoskeletal: no arthralgias, no myalgias, + back pain Skin: no skin lesions, no pruritus, Neuro: + weakness, no loss of consciousness, no syncope Psych: no anxiety, no depression, no decrease appetite Heme/Lymph: no bruising, no bleeding  ED Course: Discussed with EDP, patient requiring hospitalization for chief concerns of pain control.  Assessment/Plan  Principal Problem:   Inadequate pain control Active Problems:   TIA (transient ischemic attack)   Deep vein thrombosis (DVT) of popliteal vein of right lower extremity (HCC)   History of pulmonary embolism   History of colon cancer   Acute pyelonephritis   BPH (benign prostatic hyperplasia)   Pyelonephritis   Insulin  dependent type 2 diabetes mellitus (HCC)   Assessment and Plan:  * Inadequate pain control Secondary to acute pyelonephritis/acute cystitis Symptomatic support: Acetaminophen  650 mg p.o./rectal every 6 hours.  For mild pain, fever; morphine  4 mg IV every 4 hours as needed for moderate pain, 20 hours of coverage ordered; Dilaudid  0.5 mg IV every 4 hours as needed for severe pain, 20 hours of coverage ordered Lactated Ringer's infusion at 125 mL/h, 1 day ordered  Insulin  dependent type 2 diabetes mellitus (HCC) Home long-acting insulin  equivalent, 45 units twice daily resumed Insulin  SSI with at bedtime coverage ordered Goal inpatient blood glucose levels 140-180  Pyelonephritis Versus acute  cystitis Continue with ceftriaxone  2 g IV daily to complete a 5-day course  History of pulmonary embolism Resumed home apixaban  2.5 mg p.o. twice daily  Deep vein thrombosis (DVT) of popliteal vein of right lower extremity (HCC) Resumed home apixaban  2.5 mg p.o. twice daily  Chart reviewed.   DVT prophylaxis: Home Eliquis  Code Status: full code  Diet: Heart healthy/carb modified Family Communication: updated spouse at bedside with patient's permission  Disposition Plan: Pending clinical course Consults called: None at this time Admission status: Telemetry medical, observation  Past Medical History:  Diagnosis Date   Asthma    B12 deficiency    in the past   CAD (coronary artery disease)    s/p MI   Colon cancer (HCC) 1990   Complication of anesthesia    long time to wake up from colonoscopy.   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   Seizure (HCC)    last episode 08/2017. has no warning, will just happen. 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 He has never used smokeless tobacco. He reports that he does not drink alcohol and does not use drugs.  Allergies  Allergen Reactions   Shellfish Allergy Hives    Swelling of throat and mouth. Betadine is NOT a problem   Codeine Other (See Comments)    Reaction:  Hallucinations    Sulfa Antibiotics Hives   Family History  Problem Relation Age of Onset   Alzheimer's disease Mother    Congestive Heart Failure Father    Colon cancer Brother    Family history: Family history reviewed and not pertinent.  Prior to Admission medications   Medication Sig Start Date End Date Taking? Authorizing Provider  acetaminophen  (TYLENOL ) 500 MG tablet Take 500 mg by mouth every 6 (six) hours as needed for moderate pain.    [provider]  albuterol  (PROVENTIL  HFA;VENTOLIN  HFA) 108 (90 Base) MCG/ACT inhaler Inhale 1-2 puffs into  the lungs every 4 (four) hours as needed for wheezing or shortness of breath.    [provider]  ammonium lactate (AMLACTIN) 12 % cream Apply 1 Application topically 2 (two) times daily. 08/16/23   [provider]  bismuth  subsalicylate (PEPTO BISMOL) 262 MG chewable tablet Chew 524 mg by mouth as needed for indigestion or diarrhea or loose stools.     [provider]  cetirizine (ZYRTEC) 10 MG tablet Take 10 mg by mouth daily.    [provider]  Continuous Glucose Sensor (FREESTYLE LIBRE 2 SENSOR) MISC  08/21/23   [provider]  cyanocobalamin  (VITAMIN B12) 500 MCG tablet Take 1 tablet (500 mcg total) by mouth daily. 06/06/23   Bryn Bernardino NOVAK, MD  Dextromethorphan-guaiFENesin  10-100 MG/5ML liquid Take 10 mLs by mouth every 6 (six) hours as needed. 05/24/23    [provider]  docusate sodium  (COLACE) 100 MG capsule Take 100 mg by mouth 2 (two) times daily.    [provider]  ELIQUIS  2.5 MG TABS tablet TAKE (1) TABLET BY MOUTH TWICE DAILY. 02/24/20   Babara Call, MD  fluticasone  (FLONASE ) 50 MCG/ACT nasal spray Place 1 spray into both nostrils daily as needed for allergies or rhinitis.    [provider]  insulin  aspart (NOVOLOG ) 100 UNIT/ML injection Inject 14 Units into the skin 3 (three) times daily with meals.    [provider]  insulin  glargine (LANTUS ) 100 UNIT/ML injection Inject 45 Units into the skin 2 (two) times daily.    [provider]  meclizine  (ANTIVERT ) 25 MG tablet Take 25 mg by mouth 3 (three) times daily as needed for dizziness.    [provider]  omeprazole (PRILOSEC) 40 MG capsule Take 40 mg by mouth daily as needed (GERD symptoms).     [provider]  tamsulosin  (FLOMAX ) 0.4 MG CAPS capsule Take 1 capsule (0.4 mg total) by mouth daily after supper. 06/06/23   Bryn Bernardino NOVAK, MD   Physical Exam: Vitals:   03/06/24 0755 03/06/24 0801 03/06/24 1230 03/06/24 1355  BP:   133/76 124/71  Pulse:   (!) 102 (!) 105  Resp:   17 18  Temp:  97.8 F (36.6 C)  98.8 F (37.1 C)  TempSrc:  Oral  Oral  SpO2: 98%  95% 99%   Constitutional: appears age-appropriate, NAD, calm Eyes: PERRL, lids and conjunctivae normal ENMT: Mucous membranes are moist. Posterior pharynx clear of any exudate or lesions. Age-appropriate dentition. Hearing appropriate Neck: normal, supple, no masses, no thyromegaly Respiratory: clear to auscultation bilaterally, no wheezing, no crackles. Normal respiratory effort. No accessory muscle use.  Cardiovascular: Regular rate and rhythm, no murmurs / rubs / gallops. No extremity edema. 2+ pedal pulses. No carotid bruits.  Abdomen: Morbidly obese abdomen, + mild suprapubic tenderness, no masses palpated, no hepatosplenomegaly. Bowel sounds positive.   Musculoskeletal: no clubbing / cyanosis. No joint deformity upper and lower extremities. Good ROM, no contractures, no atrophy. Normal muscle tone.  Skin: no rashes, lesions, ulcers. No induration Neurologic: Sensation intact. Strength 5/5 in all 4.  Psychiatric: Normal judgment and insight. Alert and oriented x 3. Normal mood.   EKG: independently reviewed, showing sinus rhythm with rate of 95, QTc 424  Chest x-ray on Admission: Not indicated at this time  CT ABDOMEN PELVIS W CONTRAST Result Date: 03/06/2024 CLINICAL DATA:  Acute lower back pain. EXAM: CT ABDOMEN AND PELVIS WITH CONTRAST TECHNIQUE: Multidetector CT imaging of the abdomen and pelvis was performed using  the standard protocol following bolus administration of intravenous contrast. RADIATION DOSE REDUCTION: This exam was performed according to the departmental dose-optimization program which includes automated exposure control, adjustment of the mA and/or kV according to patient size and/or use of iterative reconstruction technique. CONTRAST:  OMNIPAQUE  IOHEXOL  300 MG/ML  SOLN COMPARISON:  August 08, 2023. FINDINGS: Lower chest: No acute abnormality. Hepatobiliary: No focal liver abnormality is seen. No gallstones, gallbladder wall thickening, or biliary dilatation. Pancreas: Unremarkable. No pancreatic ductal dilatation or surrounding inflammatory changes. Spleen: Normal in size without focal abnormality. Adrenals/Urinary Tract: Adrenal glands appear normal. Right renal atrophy is noted. Stable left renal cyst is noted. No hydronephrosis or renal obstruction is noted. Mild inflammatory changes are noted around the urinary bladder suggesting possible cystitis. Stomach/Bowel: The stomach is unremarkable. There is no evidence of bowel obstruction or inflammation. The appendix is not visualized. Stool is noted throughout colon. Vascular/Lymphatic: Aortic atherosclerosis. No enlarged abdominal or pelvic lymph nodes. Reproductive: Mild  prostatic enlargement is noted. Other: Small periumbilical fat containing hernia.  No ascites. Musculoskeletal: Bilateral L4 spondylolysis is noted. No acute osseous abnormality seen. IMPRESSION: Mild inflammatory changes are noted around urinary bladder suggesting possible cystitis. Right renal atrophy is noted. Mild prostatic enlargement. Bilateral L4 spondylolysis. Aortic Atherosclerosis (ICD10-I70.0). Electronically Signed   By: Lynwood Landy Raddle M.D.   On: 03/06/2024 11:24   Labs on Admission: I have personally reviewed following labs  CBC: Recent Labs  Lab 03/06/24 0753  WBC 16.4*  HGB 14.8  HCT 42.5  MCV 88.0  PLT 166   Basic Metabolic Panel: Recent Labs  Lab 03/06/24 0753  NA 132*  K 3.6  CL 98  CO2 23  GLUCOSE 265*  BUN 16  CREATININE 0.96  CALCIUM  8.9   GFR: CrCl cannot be calculated (Unknown ideal weight.).  Liver Function Tests: Recent Labs  Lab 03/06/24 0753  AST 14*  ALT 14  ALKPHOS 56  BILITOT 2.2*  PROT 6.4*  ALBUMIN 3.4*   Recent Labs  Lab 03/06/24 0753  LIPASE 27   Urine analysis:    Component Value Date/Time   COLORURINE AMBER (A) 03/06/2024 0830   APPEARANCEUR CLOUDY (A) 03/06/2024 0830   APPEARANCEUR Clear 08/22/2023 1429   LABSPEC 1.027 03/06/2024 0830   LABSPEC 1.012 11/01/2014 1413   PHURINE 5.0 03/06/2024 0830   GLUCOSEU >=500 (A) 03/06/2024 0830   GLUCOSEU 150 mg/dL 95/96/7983 8586   HGBUR SMALL (A) 03/06/2024 0830   BILIRUBINUR NEGATIVE 03/06/2024 0830   BILIRUBINUR Negative 08/22/2023 1429   BILIRUBINUR Negative 11/01/2014 1413   KETONESUR 5 (A) 03/06/2024 0830   PROTEINUR 100 (A) 03/06/2024 0830   NITRITE NEGATIVE 03/06/2024 0830   LEUKOCYTESUR LARGE (A) 03/06/2024 0830   LEUKOCYTESUR Negative 11/01/2014 1413   This document was prepared using Dragon Voice Recognition software and may include unintentional dictation errors.  Dr. Sherre [facility]  If 7PM-7AM, please contact overnight-coverage provider If 7AM-7PM,  please contact day attending provider [email or web address]  03/06/2024, 2:03 PM

## 2024-03-06 NOTE — ED Provider Notes (Signed)
 [facility] Provider Note    Event Date/Time   First MD Initiated Contact with Patient 03/06/24 [phone]     (approximate)   History   Chief Complaint: Back Pain and Abdominal Pain   HPI  Michael Gill is a 63 y.o. male with a history of colon cancer, CVA diabetes GERD hyperlipidemia stroke and pyelonephritis who comes ED complaining of low back pain for the past 2 days radiating to his lower abdomen associated with nausea and vomiting and decreased urine output.  He also has urinary frequency and dysuria.  Denies fever chest pain shortness of breath.        Past Medical History:  Diagnosis Date   Asthma    B12 deficiency    in the past   CAD (coronary artery disease)    s/p MI   Colon cancer (HCC) 1990   Complication of anesthesia    long time to wake up from colonoscopy.   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   Seizure (HCC)    last episode 08/2017. has no warning, will just happen. 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    difficulty forming words at times   Stroke [facility]) 2012, 03/2018   residual tremor left hand, facial droop   Tremors of nervous system    specifically left hand, since stroke    Current Outpatient Rx   Order #: [id number] Class: Historical Med   Order #:  [id number] Class: Historical Med   Order #: [id number] Class: Historical Med   Order #: [id number] Class: Historical Med   Order #: [id number] Class: Historical Med   Order #: [id number] Class: Historical Med   Order #: [id number] Class: Normal   Order #: [id number] Class: Historical Med   Order #: [id number] Class: Historical Med   Order #: [id number] Class: Normal   Order #: [id number] Class: Historical Med   Order #: [id number] Class: Historical Med   Order #: [id number] Class: Historical Med   Order #: [id number] Class: Historical Med   Order #: [id number] Class: Historical Med   Order #: [id number] Class: Normal    Past Surgical History:  Procedure Laterality Date   APPENDECTOMY  1971   CARDIAC CATHETERIZATION Left 10/12/2015   Procedure: Left Heart Cath and Coronary Angiography;  Surgeon: Cara JONETTA Lovelace, MD;  Location: [facility] INVASIVE CV LAB;  Service: Cardiovascular;  Laterality: Left;   TONSILLECTOMY      Physical Exam   Triage Vital Signs: ED Triage Vitals  Encounter Vitals Group     BP 03/06/24 0754 130/86     Girls Systolic BP Percentile --      Girls Diastolic BP Percentile --      Boys Systolic BP Percentile --      Boys Diastolic BP  Percentile --      Pulse Rate 03/06/24 0754 (!) 101     Resp 03/06/24 0754 20     Temp 03/06/24 0801 97.8 F (36.6 C)     Temp Source 03/06/24 0801 Oral     SpO2 03/06/24 0755 98 %     Weight --      Height --      Head Circumference --      Peak Flow --      Pain Score 03/06/24 0752 8     Pain Loc --      Pain Education --      Exclude from Growth Chart --     Most recent vital signs: Vitals:   03/06/24 0755 03/06/24 0801  BP:    Pulse:    Resp:    Temp:  97.8 F (36.6 C)  SpO2: 98%     General: Awake, no distress.  CV:  Good peripheral perfusion.  Tachycardia heart rate 100. Resp:  Normal effort.  Clear lungs Abd:  No distention.  Soft with mild generalized tenderness.  Pronounced tenderness over the suprapubic area.  There is tympany to  percussion. Other:     ED Results / Procedures / Treatments   Labs (all labs ordered are listed, but only abnormal results are displayed) Labs Reviewed  COMPREHENSIVE METABOLIC PANEL WITH GFR - Abnormal; Notable for the following components:      Result Value   Sodium 132 (*)    Glucose, Bld 265 (*)    Total Protein 6.4 (*)    Albumin 3.4 (*)    AST 14 (*)    Total Bilirubin 2.2 (*)    All other components within normal limits  CBC - Abnormal; Notable for the following components:   WBC 16.4 (*)    All other components within normal limits  URINALYSIS, ROUTINE W REFLEX MICROSCOPIC - Abnormal; Notable for the following components:   Color, Urine AMBER (*)    APPearance CLOUDY (*)    Glucose, UA >=500 (*)    Hgb urine dipstick SMALL (*)    Ketones, ur 5 (*)    Protein, ur 100 (*)    Leukocytes,Ua LARGE (*)    Bacteria, UA RARE (*)    All other components within normal limits  LIPASE, BLOOD     EKG Interpreted by me Sinus rhythm rate of 95.  Normal axis intervals QRS ST segments T waves   RADIOLOGY CT abdomen pelvis interpreted by me, shows cystitis and signs of pyelonephritis.  No ureteral obstruction bowel obstruction or free air.   PROCEDURES:  Procedures   MEDICATIONS ORDERED IN ED: Medications  insulin  glargine-yfgn (SEMGLEE ) injection 45 Units (has no administration in time range)  apixaban  (ELIQUIS ) tablet 2.5 mg (has no administration in time range)  acetaminophen  (TYLENOL ) tablet 650 mg (has no administration in time range)    Or  acetaminophen  (TYLENOL ) suppository 650 mg (has no administration in time range)  ondansetron  (ZOFRAN ) tablet 4 mg (has no administration in time range)    Or  ondansetron  (ZOFRAN ) injection 4 mg (has no administration in time range)  senna-docusate (Senokot-S) tablet 1 tablet (has no administration in time range)  morphine  (PF) 4 MG/ML injection 4 mg (has no administration in time range)  HYDROmorphone  (DILAUDID ) injection  0.5 mg (has no administration in time range)  cefTRIAXone  (ROCEPHIN ) 2 g in sodium chloride  0.9 % 100 mL IVPB (has no administration in time range)  lactated ringers  infusion (has no  administration in time range)  sodium chloride  0.9 % bolus 1,000 mL (0 mLs Intravenous Stopped 03/06/24 1039)  ondansetron  (ZOFRAN ) injection 4 mg (4 mg Intravenous Given 03/06/24 0848)  HYDROmorphone  (DILAUDID ) injection 1 mg (1 mg Intravenous Given 03/06/24 0848)  iohexol  (OMNIPAQUE ) 300 MG/ML solution 100 mL (100 mLs Intravenous Contrast Given 03/06/24 0945)  cefTRIAXone  (ROCEPHIN ) 2 g in sodium chloride  0.9 % 100 mL IVPB (2 g Intravenous New Bag/Given 03/06/24 1210)  HYDROmorphone  (DILAUDID ) injection 1 mg (1 mg Intravenous Given 03/06/24 1208)     IMPRESSION / MDM / ASSESSMENT AND PLAN / ED COURSE  I reviewed the triage vital signs and the nursing notes.  DDx: Cystitis, pyelonephritis, kidney stone, diverticulitis, bowel obstruction, pancreatitis  Patient's presentation is most consistent with acute presentation with potential threat to life or bodily function.  Patient presents with low abdominal pain and back pain, symptoms very suggestive of UTI.  He is nontoxic, not septic, but very uncomfortable.  Will give fluids Dilaudid  Zofran .  Labs show clear UTI with leukocytosis, normal chemistries except for mild hyperglycemia.  CT result pending.   Clinical Course as of 03/06/24 1247  Thu Mar 06, 2024  1243 Labs reveal cystitis, CT negative for obstructing stone or pyelonephritis.  With his comorbidities including insulin -dependent diabetes, case discussed with hospitalist for further management.   [PS]    Clinical Course User Index [PS] Viviann Pastor, MD     FINAL CLINICAL IMPRESSION(S) / ED DIAGNOSES   Final diagnoses:  Cystitis  Type 2 diabetes mellitus without complication, with long-term current use of insulin  (HCC)     Rx / DC Orders   ED Discharge Orders     None        Note:  This  document was prepared using Dragon voice recognition software and may include unintentional dictation errors.   Viviann Pastor, MD 03/06/24 1248

## 2024-03-06 NOTE — ED Triage Notes (Addendum)
 Pt to ED via POV from home. Pt reports lower back pain for a few days now having radiation to abdomen with N/V and decreased urine output. Hx of kidney infection and feels same. Pt reports CBG this am was 237. Pt is on eliquis .

## 2024-03-07 DIAGNOSIS — K219 Gastro-esophageal reflux disease without esophagitis: Secondary | ICD-10-CM | POA: Diagnosis present

## 2024-03-07 DIAGNOSIS — N10 Acute tubulo-interstitial nephritis: Secondary | ICD-10-CM | POA: Diagnosis present

## 2024-03-07 DIAGNOSIS — I2510 Atherosclerotic heart disease of native coronary artery without angina pectoris: Secondary | ICD-10-CM | POA: Diagnosis present

## 2024-03-07 DIAGNOSIS — Z87442 Personal history of urinary calculi: Secondary | ICD-10-CM | POA: Diagnosis not present

## 2024-03-07 DIAGNOSIS — Z86718 Personal history of other venous thrombosis and embolism: Secondary | ICD-10-CM | POA: Diagnosis not present

## 2024-03-07 DIAGNOSIS — Z800 Family history of malignant neoplasm of digestive organs: Secondary | ICD-10-CM | POA: Diagnosis not present

## 2024-03-07 DIAGNOSIS — Z885 Allergy status to narcotic agent status: Secondary | ICD-10-CM | POA: Diagnosis not present

## 2024-03-07 DIAGNOSIS — Z7901 Long term (current) use of anticoagulants: Secondary | ICD-10-CM | POA: Diagnosis not present

## 2024-03-07 DIAGNOSIS — Z794 Long term (current) use of insulin: Secondary | ICD-10-CM | POA: Diagnosis not present

## 2024-03-07 DIAGNOSIS — E1165 Type 2 diabetes mellitus with hyperglycemia: Secondary | ICD-10-CM | POA: Diagnosis present

## 2024-03-07 DIAGNOSIS — Z820 Family history of epilepsy and other diseases of the nervous system: Secondary | ICD-10-CM | POA: Diagnosis not present

## 2024-03-07 DIAGNOSIS — J45909 Unspecified asthma, uncomplicated: Secondary | ICD-10-CM | POA: Diagnosis present

## 2024-03-07 DIAGNOSIS — Z8249 Family history of ischemic heart disease and other diseases of the circulatory system: Secondary | ICD-10-CM | POA: Diagnosis not present

## 2024-03-07 DIAGNOSIS — A4159 Other Gram-negative sepsis: Secondary | ICD-10-CM | POA: Diagnosis present

## 2024-03-07 DIAGNOSIS — Z86711 Personal history of pulmonary embolism: Secondary | ICD-10-CM | POA: Diagnosis not present

## 2024-03-07 DIAGNOSIS — Z923 Personal history of irradiation: Secondary | ICD-10-CM | POA: Diagnosis not present

## 2024-03-07 DIAGNOSIS — Z79899 Other long term (current) drug therapy: Secondary | ICD-10-CM | POA: Diagnosis not present

## 2024-03-07 DIAGNOSIS — I252 Old myocardial infarction: Secondary | ICD-10-CM | POA: Diagnosis not present

## 2024-03-07 DIAGNOSIS — N400 Enlarged prostate without lower urinary tract symptoms: Secondary | ICD-10-CM | POA: Diagnosis present

## 2024-03-07 DIAGNOSIS — E785 Hyperlipidemia, unspecified: Secondary | ICD-10-CM | POA: Diagnosis present

## 2024-03-07 DIAGNOSIS — Z85038 Personal history of other malignant neoplasm of large intestine: Secondary | ICD-10-CM | POA: Diagnosis not present

## 2024-03-07 DIAGNOSIS — A419 Sepsis, unspecified organism: Secondary | ICD-10-CM | POA: Diagnosis not present

## 2024-03-07 DIAGNOSIS — R52 Pain, unspecified: Secondary | ICD-10-CM | POA: Diagnosis present

## 2024-03-07 DIAGNOSIS — I69328 Other speech and language deficits following cerebral infarction: Secondary | ICD-10-CM | POA: Diagnosis not present

## 2024-03-07 LAB — BASIC METABOLIC PANEL WITH GFR
Anion gap: 7 (ref 5–15)
BUN: 15 mg/dL (ref 8–23)
CO2: 27 mmol/L (ref 22–32)
Calcium: 8.5 mg/dL — ABNORMAL LOW (ref 8.9–10.3)
Chloride: 99 mmol/L (ref 98–111)
Creatinine, Ser: 0.94 mg/dL (ref 0.61–1.24)
GFR, Estimated: >60 mL/min (ref >60)
Glucose, Bld: 260 mg/dL — ABNORMAL HIGH (ref 70–99)
Potassium: 3.8 mmol/L (ref 3.5–5.1)
Sodium: 133 mmol/L — ABNORMAL LOW (ref 135–145)

## 2024-03-07 LAB — CBC
HCT: 39.6 % (ref 39.0–52.0)
Hemoglobin: 13.5 g/dL (ref 13.0–17.0)
MCH: 30.7 pg (ref 26.0–34.0)
MCHC: 34.1 g/dL (ref 30.0–36.0)
MCV: 90 fL (ref 80.0–100.0)
Platelets: 114 K/uL — ABNORMAL LOW (ref 150–400)
RBC: 4.4 MIL/uL (ref 4.22–5.81)
RDW: 12.2 % (ref 11.5–15.5)
WBC: 9.7 K/uL (ref 4.0–10.5)
nRBC: 0 % (ref 0.0–0.2)

## 2024-03-07 LAB — GLUCOSE, CAPILLARY
Glucose-Capillary: 222 mg/dL — ABNORMAL HIGH (ref 70–99)
Glucose-Capillary: 266 mg/dL — ABNORMAL HIGH (ref 70–99)
Glucose-Capillary: 212 mg/dL — ABNORMAL HIGH (ref 70–99)
Glucose-Capillary: 203 mg/dL — ABNORMAL HIGH (ref 70–99)

## 2024-03-07 MED ORDER — HYDROMORPHONE HCL 1 MG/ML IJ SOLN
1.0000 mg | INTRAMUSCULAR | Status: DC | PRN
  Administered 2024-03-07 – 2024-03-08 (×2): 1 mg via INTRAVENOUS
  Filled 2024-03-07 (×2): qty 1

## 2024-03-07 MED ORDER — OXYCODONE HCL 5 MG PO TABS
5.0000 mg | ORAL_TABLET | ORAL | Status: DC | PRN
  Administered 2024-03-08 – 2024-03-09 (×2): 5 mg via ORAL
  Filled 2024-03-07 (×2): qty 1

## 2024-03-07 NOTE — Care Management Obs Status (Signed)
 MEDICARE OBSERVATION STATUS NOTIFICATION   Patient Details  Name: Michael Gill MRN: [id number] Date of Birth: April 13, 1961   Medicare Observation Status Notification Given:  Chaney BRANDY CHRISTIANE LELON, CMA 03/07/2024, 11:13 AM

## 2024-03-07 NOTE — Progress Notes (Signed)
  Progress Note   Patient: Michael Gill FMW:[id number] DOB: 06-24-1961 DOA: 03/06/2024     0 DOS: the patient was seen and examined on 03/07/2024   Brief hospital course: Mr. Michael Gill is a 63 year old male with history of right renal atrophy, GERD, hyperlipidemia, history of PE and DVT on Eliquis , history of CVA, insulin -dependent diabetes mellitus, history of seizure and pseudoseizure, conversion disorder, who presents ED for chief concerns of low back pain for several days.    Patient endorses associated nausea, vomiting, abdominal pain, decreased urine output. See H&P for full HPI on admission & ED course.  Patient was admitted and started on IV antibiotics for acute cystitis / pyelonephritis pending cultures. Further hospital course and management as outlined below.    Assessment and Plan:  Sepsis due to UTI / pyelonephritis Sepsis POA as evidenced by tachycardia, leukocytosis, fevers developed overnight following admission. Leukocytosis resolved. T max 101 F overnight. --Continue empiric Rocephin  --Add on urine culture to initial UA --Follow blood cultures --IV fluids --Pain control, anti-emetics, Tylenol  PRN fevers  * Inadequate pain control - due to above --Tylenol , oxycodone  PRN, IV dilaudid  for breakthrough Note codeine allergy, pt states he tolerates oxycodone   Insulin  dependent type 2 diabetes mellitus (HCC) Home long-acting insulin  equivalent, 45 units twice daily resumed Insulin  SSI with at bedtime coverage ordered Goal inpatient blood glucose levels 140-180   History of pulmonary embolism Resumed home apixaban  2.5 mg p.o. twice daily  Deep vein thrombosis (DVT) of popliteal vein of right lower extremity (HCC) Resumed home apixaban  2.5 mg p.o. twice daily      Subjective: Pt seated edge of bed, wife at bedside today. He reports ongoing intermittent nausea.  Back/flank pain uncontrolled, needing medication administered.  Re codeine allergy, pt reported he  tolerates oxycodone , and had no adverse effect from IV dilaudid  given previously.     Physical Exam: Vitals:   03/07/24 0128 03/07/24 0502 03/07/24 0741 03/07/24 1141  BP:  121/69 119/71 118/67  Pulse:  98 98 89  Resp:  16    Temp: 100 F (37.8 C) 98.5 F (36.9 C) 97.8 F (36.6 C) 98 F (36.7 C)  TempSrc: Oral  Oral Oral  SpO2:  98% 98% 95%  Weight:      Height:       General exam: awake, alert, no acute distress HEENT: moist mucus membranes, hearing grossly normal  Respiratory system: CTAB, no wheezes, rales or rhonchi, normal respiratory effort. Cardiovascular system: normal S1/S2, RRR, no pedal edema.   Gastrointestinal system: soft, NT, ND, R CVA tenderness Central nervous system: A&O x3. no gross focal neurologic deficits, normal speech Extremities: moves all, no edema, normal tone Skin: dry, intact, normal temperature Psychiatry: normal mood, congruent affect, judgement and insight appear normal    Data Reviewed:  Notable labs -- Na 133, glucose 260, ca 8.5, platelets 114k  Family Communication: wife at bedside  Disposition: Status is: Inpatient Remains admitted on IV antibiotics for sepsis/UTI with fever last night   Planned Discharge Destination: Home    Time spent: 42 minutes  Author: Burnard DELENA Cunning, DO 03/07/2024 2:45 PM  For on call review [email or web address].

## 2024-03-08 DIAGNOSIS — R52 Pain, unspecified: Secondary | ICD-10-CM | POA: Diagnosis not present

## 2024-03-08 LAB — CBC
HCT: 37.6 % — ABNORMAL LOW (ref 39.0–52.0)
Hemoglobin: 13 g/dL (ref 13.0–17.0)
MCH: 30.6 pg (ref 26.0–34.0)
MCHC: 34.6 g/dL (ref 30.0–36.0)
MCV: 88.5 fL (ref 80.0–100.0)
Platelets: 143 K/uL — ABNORMAL LOW (ref 150–400)
RBC: 4.25 MIL/uL (ref 4.22–5.81)
RDW: 12.4 % (ref 11.5–15.5)
WBC: 5.5 K/uL (ref 4.0–10.5)
nRBC: 0 % (ref 0.0–0.2)

## 2024-03-08 LAB — GLUCOSE, CAPILLARY
Glucose-Capillary: 169 mg/dL — ABNORMAL HIGH (ref 70–99)
Glucose-Capillary: 188 mg/dL — ABNORMAL HIGH (ref 70–99)
Glucose-Capillary: 198 mg/dL — ABNORMAL HIGH (ref 70–99)
Glucose-Capillary: 276 mg/dL — ABNORMAL HIGH (ref 70–99)

## 2024-03-08 LAB — BASIC METABOLIC PANEL WITH GFR
Anion gap: 7 (ref 5–15)
BUN: 15 mg/dL (ref 8–23)
CO2: 25 mmol/L (ref 22–32)
Calcium: 8.5 mg/dL — ABNORMAL LOW (ref 8.9–10.3)
Chloride: 99 mmol/L (ref 98–111)
Creatinine, Ser: 0.97 mg/dL (ref 0.61–1.24)
GFR, Estimated: >60 mL/min
Glucose, Bld: 231 mg/dL — ABNORMAL HIGH (ref 70–99)
Potassium: 3.6 mmol/L (ref 3.5–5.1)
Sodium: 131 mmol/L — ABNORMAL LOW (ref 135–145)

## 2024-03-08 LAB — HEMOGLOBIN A1C
Hgb A1c MFr Bld: 11.2 % — ABNORMAL HIGH (ref 4.8–5.6)
Mean Plasma Glucose: 275 mg/dL

## 2024-03-08 MED ORDER — SODIUM CHLORIDE 0.9 % IV SOLN
INTRAVENOUS | Status: DC
  Administered 2024-03-09: 1000 mL via INTRAVENOUS

## 2024-03-08 NOTE — Plan of Care (Signed)
  Problem: Health Behavior/Discharge Planning: Goal: Ability to manage health-related needs will improve Outcome: Progressing   Problem: Pain Managment: Goal: General experience of comfort will improve and/or be controlled Outcome: Progressing   Problem: Education: Goal: Ability to describe self-care measures that may prevent or decrease complications (Diabetes Survival Skills Education) will improve Outcome: Progressing

## 2024-03-08 NOTE — Progress Notes (Addendum)
  Progress Note   Patient: Michael Gill FMW:[id number] DOB: [birth year] DOA: 03/06/2024     1 DOS: the patient was seen and examined on 03/08/2024   Brief hospital course: Michael Gill is a 63 year old male with history of right renal atrophy, GERD, hyperlipidemia, history of PE and DVT on Eliquis , history of CVA, insulin -dependent diabetes mellitus, history of seizure and pseudoseizure, conversion disorder, who presents ED for chief concerns of low back pain for several days.    Patient endorses associated nausea, vomiting, abdominal pain, decreased urine output. See H&P for full HPI on admission & ED course.  Patient was admitted and started on IV antibiotics for acute cystitis / pyelonephritis pending cultures. Further hospital course and management as outlined below.    Assessment and Plan:  Sepsis due to UTI / pyelonephritis Sepsis POA as evidenced by tachycardia, leukocytosis, fevers developed overnight following admission. Leukocytosis resolved. T max 101 F overnight 8/8 around midnight. --Continue empiric Rocephin  --Add on urine culture to initial UA --Follow blood cultures --IV fluids --Pain control, anti-emetics, Tylenol  PRN fevers  Mild hyponatremia - Na 131 today. Pt with persistent nausea, suspect mild hypovolemia --Gentle IV fluids NS @ 75 cc/hr x 1 day --Repeat BMP in AM  Inadequate pain control - due to above --Tylenol , oxycodone  PRN, IV dilaudid  for breakthrough Note codeine allergy, pt states he tolerates oxycodone   Insulin  dependent type 2 diabetes mellitus (HCC) Home long-acting insulin  equivalent, 45 units twice daily resumed Insulin  SSI with at bedtime coverage ordered Goal inpatient blood glucose levels 140-180   History of pulmonary embolism Resumed home apixaban  2.5 mg p.o. twice daily  Deep vein thrombosis (DVT) of popliteal vein of right lower extremity (HCC) Resumed home apixaban  2.5 mg p.o. twice daily      Subjective: Pt awake resting in  bed this AM, wife at bedside.  He reports nausea but not vomiting.  No fever but some intermittent chills due to room temp being cool.  He also has persistent flank/low back pain today.  No other acute complaints.    Physical Exam: Vitals:   03/07/24 1612 03/07/24 1942 03/08/24 0316 03/08/24 0736  BP: 136/74 137/80 (!) 113/51 118/74  Pulse: 81 88 80 78  Resp:   20 16  Temp: 98 F (36.7 C) (!) 97.3 F (36.3 C) 99.6 F (37.6 C) 97.9 F (36.6 C)  TempSrc: Oral Oral Oral Oral  SpO2: 99% 99% 95% 95%  Weight:      Height:       General exam: awake, alert, no acute distress HEENT: moist mucus membranes, hearing grossly normal  Respiratory system: CTAB, no wheezes, rales or rhonchi, normal respiratory effort. Cardiovascular system: normal S1/S2, RRR, no pedal edema.   Gastrointestinal system: soft, NT, ND, R CVA tenderness Central nervous system: A&O x3. no gross focal neurologic deficits, normal speech Extremities: moves all, no edema, normal tone Skin: dry, intact, normal temperature Psychiatry: normal mood, congruent affect, judgement and insight appear normal    Data Reviewed:  Notable labs -- Na 131, glucose 231, ca 8.5, platelets 114 >> 143  Family Communication: wife at bedside  Disposition: Status is: Inpatient Remains admitted on IV antibiotics for sepsis/UTI with cultures pending, persistent nausea and flank pain.    Planned Discharge Destination: Home    Time spent: 38 minutes  Author: Burnard DELENA Cunning, DO 03/08/2024 12:33 PM  For on call review [email or web address].

## 2024-03-08 NOTE — Plan of Care (Signed)
  Problem: Education: Goal: Knowledge of General Education information will improve Description: Including pain rating scale, medication(s)/side effects and non-pharmacologic comfort measures Outcome: Progressing   Problem: Health Behavior/Discharge Planning: Goal: Ability to manage health-related needs will improve Outcome: Progressing   Problem: Clinical Measurements: Goal: Ability to maintain clinical measurements within normal limits will improve Outcome: Progressing Goal: Will remain free from infection Outcome: Progressing Goal: Diagnostic test results will improve Outcome: Progressing Goal: Respiratory complications will improve Outcome: Progressing Goal: Cardiovascular complication will be avoided Outcome: Progressing   Problem: Activity: Goal: Risk for activity intolerance will decrease Outcome: Progressing   Problem: Nutrition: Goal: Adequate nutrition will be maintained Outcome: Progressing   Problem: Coping: Goal: Level of anxiety will decrease Outcome: Progressing   Problem: Elimination: Goal: Will not experience complications related to bowel motility Outcome: Progressing Goal: Will not experience complications related to urinary retention Outcome: Progressing   Problem: Pain Managment: Goal: General experience of comfort will improve and/or be controlled Outcome: Progressing   Problem: Safety: Goal: Ability to remain free from injury will improve Outcome: Progressing   Problem: Skin Integrity: Goal: Risk for impaired skin integrity will decrease Outcome: Progressing   Problem: Education: Goal: Ability to describe self-care measures that may prevent or decrease complications (Diabetes Survival Skills Education) will improve Outcome: Progressing Goal: Individualized Educational Video(s) Outcome: Progressing   Problem: Coping: Goal: Ability to adjust to condition or change in health will improve Outcome: Progressing   Problem: Fluid  Volume: Goal: Ability to maintain a balanced intake and output will improve Outcome: Progressing   Problem: Health Behavior/Discharge Planning: Goal: Ability to identify and utilize available resources and services will improve Outcome: Progressing Goal: Ability to manage health-related needs will improve Outcome: Progressing   Problem: Metabolic: Goal: Ability to maintain appropriate glucose levels will improve Outcome: Progressing   Problem: Nutritional: Goal: Maintenance of adequate nutrition will improve Outcome: Progressing Goal: Progress toward achieving an optimal weight will improve Outcome: Progressing   Problem: Skin Integrity: Goal: Risk for impaired skin integrity will decrease Outcome: Progressing   Problem: Tissue Perfusion: Goal: Adequacy of tissue perfusion will improve Outcome: Progressing   Problem: Urinary Elimination: Goal: Signs and symptoms of infection will decrease Outcome: Progressing

## 2024-03-09 DIAGNOSIS — A419 Sepsis, unspecified organism: Secondary | ICD-10-CM | POA: Diagnosis not present

## 2024-03-09 LAB — URINE CULTURE: Culture: >=100000 — AB

## 2024-03-09 LAB — BASIC METABOLIC PANEL WITH GFR
Anion gap: 7 (ref 5–15)
BUN: 15 mg/dL (ref 8–23)
CO2: 27 mmol/L (ref 22–32)
Calcium: 8.4 mg/dL — ABNORMAL LOW (ref 8.9–10.3)
Chloride: 98 mmol/L (ref 98–111)
Creatinine, Ser: 1.17 mg/dL (ref 0.61–1.24)
GFR, Estimated: >60 mL/min (ref >60)
Glucose, Bld: 302 mg/dL — ABNORMAL HIGH (ref 70–99)
Potassium: 4.2 mmol/L (ref 3.5–5.1)
Sodium: 132 mmol/L — ABNORMAL LOW (ref 135–145)

## 2024-03-09 LAB — GLUCOSE, CAPILLARY
Glucose-Capillary: 162 mg/dL — ABNORMAL HIGH (ref 70–99)
Glucose-Capillary: 125 mg/dL — ABNORMAL HIGH (ref 70–99)

## 2024-03-09 MED ORDER — INSULIN GLARGINE 100 UNIT/ML ~~LOC~~ SOLN: 45.0000 [IU] | Freq: Every day | SUBCUTANEOUS | Status: DC

## 2024-03-09 MED ORDER — LEVOFLOXACIN 500 MG PO TABS: 500.0000 mg | ORAL_TABLET | Freq: Every day | ORAL | 0 refills | Status: AC

## 2024-03-09 NOTE — Discharge Summary (Signed)
 Physician Discharge Summary   Patient: Michael Gill MRN: [id number] DOB: 1961/06/11  Admit date:     03/06/2024  Discharge date: 03/09/24  Discharge Physician: AIDA CHO   PCP: Kandis [name], MD   Recommendations at discharge:    Follow up with PCP in 1 week  Discharge Diagnoses: Principal Problem:   Sepsis (HCC) Active Problems:   History of pulmonary embolism   History of colon cancer   Acute pyelonephritis   BPH (benign prostatic hyperplasia)   Pyelonephritis   Inadequate pain control   Insulin  dependent type 2 diabetes mellitus (HCC)  Resolved Problems:   * No resolved hospital problems. *  Hospital Course:   Michael Gill is a 63 year old male with history of right renal atrophy, GERD, hyperlipidemia, history of PE and DVT on Eliquis , history of CVA, insulin -dependent diabetes mellitus, history of seizure and pseudoseizure, conversion disorder, who presented to the ED with low back pain, nausea, vomiting, abdominal pain and decreased urine output.  He developed fever in the hospital with temperature of 101 F.  He was admitted to the hospital for sepsis secondary to acute UTI/pyelonephritis.    Assessment and Plan:   Sepsis secondary to acute UTI/pyelonephritis: Urine culture showed Klebsiella pneumonia.  He completed 4 days of IV ceftriaxone .  He will be discharged on Levaquin  for 3 days to complete 7 days of treatment.   Back pain/abdominal pain: Improved.  This was likely related to UTI.   Hyperglycemia-induced hyponatremia: Sodium is stable.  He is asymptomatic.   Insulin -dependent type 2 DM: Continue Lantus  45 units twice daily and NovoLog  10 units 3 times daily with meals (home regimen). Hemoglobin A1c was 11.2 Outpatient follow-up with PCP recommended for ongoing management.   History of PE and right lower extremity DVT: Continue Eliquis    His condition has improved and he is deemed stable for discharge to home today.  Discharge plan was  discussed with the patient and his wife at the bedside.      Consultants: None Procedures performed: None  Disposition: Home Diet recommendation:  Discharge Diet Orders (From admission, onward)     Start     Ordered   03/09/24 0000  Diet - low sodium heart healthy        03/09/24 1137   03/09/24 0000  Diet Carb Modified        03/09/24 1137           Cardiac and Carb modified diet DISCHARGE MEDICATION: Allergies as of 03/09/2024       Reactions   Shellfish Allergy Hives   Swelling of throat and mouth. Betadine is NOT a problem   Codeine Other (See Comments)   Reaction:  Hallucinations    Sulfa Antibiotics Hives        Medication List     STOP taking these medications    Dextromethorphan-guaiFENesin  10-100 MG/5ML liquid   tamsulosin  0.4 MG Caps capsule Commonly known as: FLOMAX        TAKE these medications    acetaminophen  500 MG tablet Commonly known as: TYLENOL  Take 500 mg by mouth every 6 (six) hours as needed for moderate pain.   albuterol  108 (90 Base) MCG/ACT inhaler Commonly known as: VENTOLIN  HFA Inhale 1-2 puffs into the lungs every 4 (four) hours as needed for wheezing or shortness of breath.   ammonium lactate 12 % cream Commonly known as: AMLACTIN Apply 1 Application topically 2 (two) times daily.   bismuth  subsalicylate 262 MG chewable tablet Commonly known as:  PEPTO BISMOL Chew 524 mg by mouth as needed for indigestion or diarrhea or loose stools.   cetirizine 10 MG tablet Commonly known as: ZYRTEC Take 10 mg by mouth daily.   cyanocobalamin  500 MCG tablet Commonly known as: VITAMIN B12 Take 1 tablet (500 mcg total) by mouth daily.   docusate sodium  100 MG capsule Commonly known as: COLACE Take 100 mg by mouth 2 (two) times daily.   Eliquis  2.5 MG Tabs tablet Generic drug: apixaban  TAKE (1) TABLET BY MOUTH TWICE DAILY.   fluticasone  50 MCG/ACT nasal spray Commonly known as: FLONASE  Place 1 spray into both nostrils daily  as needed for allergies or rhinitis.   FreeStyle Libre 2 Sensor Misc   insulin  aspart 100 UNIT/ML injection Commonly known as: novoLOG  Inject 10 Units into the skin 3 (three) times daily with meals.   insulin  glargine 100 UNIT/ML injection Commonly known as: LANTUS  Inject 0.45 mLs (45 Units total) into the skin at bedtime.   levofloxacin  500 MG tablet Commonly known as: LEVAQUIN  Take 1 tablet (500 mg total) by mouth daily for 3 days. Start taking on: March 10, 2024   meclizine  25 MG tablet Commonly known as: ANTIVERT  Take 25 mg by mouth 3 (three) times daily as needed for dizziness.   omeprazole 40 MG capsule Commonly known as: PRILOSEC Take 40 mg by mouth daily as needed (GERD symptoms).        Follow-up Information     [name], [name], MD Follow up.   Specialty: Family Medicine Why: Office closed today patient to make own follow up appt  hospital follow up Contact information: [address] [facility] [address] [place] [address] [phone]                Discharge Exam: Michael Gill   03/06/24 1745  Weight: 98 kg   GEN: NAD SKIN: Warm and dry EYES: No pallor or icterus ENT: MMM CV: RRR PULM: CTA B ABD: soft, obese, NT, +BS CNS: AAO x 3, non focal EXT: No edema or tenderness   Condition at discharge: good  The results of significant diagnostics from this hospitalization (including imaging, microbiology, ancillary and laboratory) are listed below for reference.   Imaging Studies: CT ABDOMEN PELVIS W CONTRAST Result Date: 03/06/2024 CLINICAL DATA:  Acute lower back pain. EXAM: CT ABDOMEN AND PELVIS WITH CONTRAST TECHNIQUE: Multidetector CT imaging of the abdomen and pelvis was performed using the standard protocol following bolus administration of intravenous contrast. RADIATION DOSE REDUCTION: This exam was performed according to the departmental dose-optimization program which includes automated exposure control, adjustment of the mA and/or  kV according to patient size and/or use of iterative reconstruction technique. CONTRAST:  OMNIPAQUE  IOHEXOL  300 MG/ML  SOLN COMPARISON:  August 08, 2023. FINDINGS: Lower chest: No acute abnormality. Hepatobiliary: No focal liver abnormality is seen. No gallstones, gallbladder wall thickening, or biliary dilatation. Pancreas: Unremarkable. No pancreatic ductal dilatation or surrounding inflammatory changes. Spleen: Normal in size without focal abnormality. Adrenals/Urinary Tract: Adrenal glands appear normal. Right renal atrophy is noted. Stable left renal cyst is noted. No hydronephrosis or renal obstruction is noted. Mild inflammatory changes are noted around the urinary bladder suggesting possible cystitis. Stomach/Bowel: The stomach is unremarkable. There is no evidence of bowel obstruction or inflammation. The appendix is not visualized. Stool is noted throughout colon. Vascular/Lymphatic: Aortic atherosclerosis. No enlarged abdominal or pelvic lymph nodes. Reproductive: Mild prostatic enlargement is noted. Other: Small periumbilical fat containing hernia.  No ascites. Musculoskeletal: Bilateral L4 spondylolysis is  noted. No acute osseous abnormality seen. IMPRESSION: Mild inflammatory changes are noted around urinary bladder suggesting possible cystitis. Right renal atrophy is noted. Mild prostatic enlargement. Bilateral L4 spondylolysis. Aortic Atherosclerosis (ICD10-I70.0). Electronically Signed   By: Lynwood Landy Raddle M.D.   On: 03/06/2024 11:24    Microbiology: Results for orders placed or performed during the hospital encounter of 03/06/24  Urine Culture (for pregnant, neutropenic or urologic patients or patients with an indwelling urinary catheter)     Status: Abnormal   Collection Time: 03/06/24  8:30 AM   Specimen: Urine, Clean Catch  Result Value Ref Range Status   Specimen Description   Final    URINE, CLEAN CATCH Performed at [facility], [address] [place], [place]  [address]    Special Requests   Final    NONE Performed at [facility], [address] [place], [place] [address]    Culture >=100,000 COLONIES/mL KLEBSIELLA PNEUMONIAE (A)  Final   Report Status 03/09/2024 FINAL  Final   Organism ID, Bacteria KLEBSIELLA PNEUMONIAE (A)  Final      Susceptibility   Klebsiella pneumoniae - MIC*    AMPICILLIN >=32 RESISTANT Resistant     CEFAZOLIN  <=4 SENSITIVE Sensitive     CEFEPIME  <=0.12 SENSITIVE Sensitive     CEFTRIAXONE  <=0.25 SENSITIVE Sensitive     CIPROFLOXACIN  <=0.25 SENSITIVE Sensitive     GENTAMICIN <=1 SENSITIVE Sensitive     IMIPENEM <=0.25 SENSITIVE Sensitive     NITROFURANTOIN 64 INTERMEDIATE Intermediate     TRIMETH/SULFA <=20 SENSITIVE Sensitive     AMPICILLIN/SULBACTAM 4 SENSITIVE Sensitive     PIP/TAZO <=4 SENSITIVE Sensitive ug/mL    * >=100,000 COLONIES/mL KLEBSIELLA PNEUMONIAE    Labs: CBC: Recent Labs  Lab 03/06/24 0753 03/07/24 0353 03/08/24 0410  WBC 16.4* 9.7 5.5  HGB 14.8 13.5 13.0  HCT 42.5 39.6 37.6*  MCV 88.0 90.0 88.5  PLT 166 114* 143*   Basic Metabolic Panel: Recent Labs  Lab 03/06/24 0753 03/07/24 0353 03/08/24 0410 03/09/24 0408  NA 132* 133* 131* 132*  K 3.6 3.8 3.6 4.2  CL 98 99 99 98  CO2 23 27 25 27   GLUCOSE 265* 260* 231* 302*  BUN 16 15 15 15   CREATININE 0.96 0.94 0.97 1.17  CALCIUM  8.9 8.5* 8.5* 8.4*   Liver Function Tests: Recent Labs  Lab 03/06/24 0753  AST 14*  ALT 14  ALKPHOS 56  BILITOT 2.2*  PROT 6.4*  ALBUMIN 3.4*   CBG: Recent Labs  Lab 03/08/24 1231 03/08/24 1645 03/08/24 2120 03/09/24 0754 03/09/24 1212  GLUCAP 188* 198* 276* 162* 125*    Discharge time spent: greater than 30 minutes.  Signed: AIDA CHO, MD [facility] 03/09/2024

## 2024-03-09 NOTE — Care Management Important Message (Signed)
 Important Message  Patient Details  Name: Michael Gill MRN: [id number] Date of Birth: [birth year]   Important Message Given:  Yes - Medicare IM     Hau Sanor W, CMA 03/09/2024, 10:44 AM

## 2024-03-13 ENCOUNTER — Other Ambulatory Visit (INDEPENDENT_AMBULATORY_CARE_PROVIDER_SITE_OTHER): Admitting: Podiatry

## 2024-03-13 DIAGNOSIS — M2011 Hallux valgus (acquired), right foot: Secondary | ICD-10-CM

## 2024-03-13 DIAGNOSIS — L84 Corns and callosities: Secondary | ICD-10-CM

## 2024-03-13 DIAGNOSIS — E119 Type 2 diabetes mellitus without complications: Secondary | ICD-10-CM

## 2024-03-13 DIAGNOSIS — M2042 Other hammer toe(s) (acquired), left foot: Secondary | ICD-10-CM

## 2024-03-13 DIAGNOSIS — M2041 Other hammer toe(s) (acquired), right foot: Secondary | ICD-10-CM

## 2024-03-13 DIAGNOSIS — Z794 Long term (current) use of insulin: Secondary | ICD-10-CM

## 2024-03-13 DIAGNOSIS — M2012 Hallux valgus (acquired), left foot: Secondary | ICD-10-CM

## 2024-03-13 NOTE — Progress Notes (Signed)
 1. Callus   2. Hallux valgus, acquired, bilateral   3. Acquired hammertoes of both feet   4. Type 2 diabetes mellitus without complication, with long-term current use of insulin  (HCC)    Orders Placed This Encounter  Procedures   For home use only DME Other see comment    To [facility]: Dispense one pair extra depth shoe and 3 pair custom insoles. Offload calluses submet head 1 and submet head 2 left foot.    Length of Need:   12 Months

## 2024-04-24 ENCOUNTER — Other Ambulatory Visit: Payer: Self-pay | Admitting: Physician Assistant

## 2024-04-24 DIAGNOSIS — D179 Benign lipomatous neoplasm, unspecified: Secondary | ICD-10-CM

## 2024-05-01 ENCOUNTER — Ambulatory Visit
Admission: RE | Admit: 2024-05-01 | Discharge: 2024-05-01 | Disposition: A | Discharge status: Home / Self Care | Source: Ambulatory Visit | Attending: Physician Assistant | Admitting: Physician Assistant

## 2024-05-01 DIAGNOSIS — D179 Benign lipomatous neoplasm, unspecified: Secondary | ICD-10-CM | POA: Insufficient documentation

## 2024-06-02 ENCOUNTER — Ambulatory Visit (INDEPENDENT_AMBULATORY_CARE_PROVIDER_SITE_OTHER): Admitting: Podiatry

## 2024-06-02 DIAGNOSIS — Z91198 Patient's noncompliance with other medical treatment and regimen for other reason: Secondary | ICD-10-CM

## 2024-06-02 NOTE — Progress Notes (Signed)
 1. Failure to attend appointment with reason given    Appointment rescheduled by patient.

## 2024-06-18 ENCOUNTER — Emergency Department

## 2024-06-18 ENCOUNTER — Ambulatory Visit

## 2024-06-18 ENCOUNTER — Inpatient Hospital Stay
Admission: EM | Admit: 2024-06-18 | Discharge: 2024-06-22 | DRG: 871 | Disposition: A | Discharge status: Home / Self Care | Source: Ambulatory Visit | Attending: Internal Medicine | Admitting: Internal Medicine

## 2024-06-18 ENCOUNTER — Other Ambulatory Visit: Payer: Self-pay

## 2024-06-18 DIAGNOSIS — G4730 Sleep apnea, unspecified: Secondary | ICD-10-CM | POA: Diagnosis present

## 2024-06-18 DIAGNOSIS — Z794 Long term (current) use of insulin: Secondary | ICD-10-CM

## 2024-06-18 DIAGNOSIS — J45909 Unspecified asthma, uncomplicated: Secondary | ICD-10-CM | POA: Diagnosis present

## 2024-06-18 DIAGNOSIS — E66811 Obesity, class 1: Secondary | ICD-10-CM | POA: Diagnosis present

## 2024-06-18 DIAGNOSIS — I2510 Atherosclerotic heart disease of native coronary artery without angina pectoris: Secondary | ICD-10-CM | POA: Diagnosis present

## 2024-06-18 DIAGNOSIS — Z86718 Personal history of other venous thrombosis and embolism: Secondary | ICD-10-CM

## 2024-06-18 DIAGNOSIS — Z7901 Long term (current) use of anticoagulants: Secondary | ICD-10-CM

## 2024-06-18 DIAGNOSIS — Z91013 Allergy to seafood: Secondary | ICD-10-CM

## 2024-06-18 DIAGNOSIS — I69328 Other speech and language deficits following cerebral infarction: Secondary | ICD-10-CM

## 2024-06-18 DIAGNOSIS — K921 Melena: Secondary | ICD-10-CM | POA: Diagnosis not present

## 2024-06-18 DIAGNOSIS — I252 Old myocardial infarction: Secondary | ICD-10-CM

## 2024-06-18 DIAGNOSIS — Z85038 Personal history of other malignant neoplasm of large intestine: Secondary | ICD-10-CM

## 2024-06-18 DIAGNOSIS — E785 Hyperlipidemia, unspecified: Secondary | ICD-10-CM | POA: Diagnosis present

## 2024-06-18 DIAGNOSIS — Z8249 Family history of ischemic heart disease and other diseases of the circulatory system: Secondary | ICD-10-CM

## 2024-06-18 DIAGNOSIS — Z79899 Other long term (current) drug therapy: Secondary | ICD-10-CM

## 2024-06-18 DIAGNOSIS — R0602 Shortness of breath: Secondary | ICD-10-CM | POA: Diagnosis not present

## 2024-06-18 DIAGNOSIS — Z6834 Body mass index (BMI) 34.0-34.9, adult: Secondary | ICD-10-CM

## 2024-06-18 DIAGNOSIS — E1165 Type 2 diabetes mellitus with hyperglycemia: Secondary | ICD-10-CM | POA: Diagnosis present

## 2024-06-18 DIAGNOSIS — Z86711 Personal history of pulmonary embolism: Secondary | ICD-10-CM

## 2024-06-18 DIAGNOSIS — Z87442 Personal history of urinary calculi: Secondary | ICD-10-CM

## 2024-06-18 DIAGNOSIS — Z800 Family history of malignant neoplasm of digestive organs: Secondary | ICD-10-CM

## 2024-06-18 DIAGNOSIS — A419 Sepsis, unspecified organism: Secondary | ICD-10-CM | POA: Diagnosis not present

## 2024-06-18 DIAGNOSIS — Z885 Allergy status to narcotic agent status: Secondary | ICD-10-CM

## 2024-06-18 DIAGNOSIS — Z882 Allergy status to sulfonamides status: Secondary | ICD-10-CM

## 2024-06-18 DIAGNOSIS — Z820 Family history of epilepsy and other diseases of the nervous system: Secondary | ICD-10-CM

## 2024-06-18 DIAGNOSIS — J189 Pneumonia, unspecified organism: Secondary | ICD-10-CM | POA: Diagnosis not present

## 2024-06-18 DIAGNOSIS — K649 Unspecified hemorrhoids: Secondary | ICD-10-CM | POA: Diagnosis present

## 2024-06-18 LAB — BASIC METABOLIC PANEL WITH GFR
Anion gap: 11 (ref 5–15)
BUN: 13 mg/dL (ref 8–23)
CO2: 22 mmol/L (ref 22–32)
Calcium: 8.9 mg/dL (ref 8.9–10.3)
Chloride: 102 mmol/L (ref 98–111)
Creatinine, Ser: 0.87 mg/dL (ref 0.61–1.24)
GFR, Estimated: >60 mL/min (ref >60)
Glucose, Bld: 180 mg/dL — ABNORMAL HIGH (ref 70–99)
Potassium: 3.6 mmol/L (ref 3.5–5.1)
Sodium: 134 mmol/L — ABNORMAL LOW (ref 135–145)

## 2024-06-18 LAB — CBC
HCT: 40.1 % (ref 39.0–52.0)
Hemoglobin: 13.9 g/dL (ref 13.0–17.0)
MCH: 30.5 pg (ref 26.0–34.0)
MCHC: 34.7 g/dL (ref 30.0–36.0)
MCV: 88.1 fL (ref 80.0–100.0)
Platelets: 176 K/uL (ref 150–400)
RBC: 4.55 MIL/uL (ref 4.22–5.81)
RDW: 12.1 % (ref 11.5–15.5)
WBC: 12 K/uL — ABNORMAL HIGH (ref 4.0–10.5)
nRBC: 0 % (ref 0.0–0.2)

## 2024-06-18 LAB — HEPATIC FUNCTION PANEL
ALT: 13 U/L (ref 0–44)
AST: 12 U/L — ABNORMAL LOW (ref 15–41)
Albumin: 3.8 g/dL (ref 3.5–5.0)
Alkaline Phosphatase: 90 U/L (ref 38–126)
Bilirubin, Direct: 0.3 mg/dL — ABNORMAL HIGH (ref 0.0–0.2)
Indirect Bilirubin: 0.4 mg/dL (ref 0.3–0.9)
Total Bilirubin: 0.7 mg/dL (ref 0.0–1.2)
Total Protein: 7.1 g/dL (ref 6.5–8.1)

## 2024-06-18 LAB — GLUCOSE, CAPILLARY
Glucose-Capillary: 207 mg/dL — ABNORMAL HIGH (ref 70–99)
Glucose-Capillary: 195 mg/dL — ABNORMAL HIGH (ref 70–99)

## 2024-06-18 LAB — HIV ANTIBODY (ROUTINE TESTING W REFLEX): HIV Screen 4th Generation wRfx: NONREACTIVE

## 2024-06-18 LAB — LACTIC ACID, PLASMA: Lactic Acid, Venous: 1.2 mmol/L (ref 0.5–1.9)

## 2024-06-18 LAB — TROPONIN T, HIGH SENSITIVITY: Troponin T High Sensitivity: <15 ng/L (ref 0–19)

## 2024-06-18 MED ORDER — ACETAMINOPHEN 325 MG PO TABS
650.0000 mg | ORAL_TABLET | Freq: Four times a day (QID) | ORAL | Status: DC | PRN
Start: 2024-06-18 — End: 2024-06-22
  Administered 2024-06-21: 650 mg via ORAL
  Filled 2024-06-18: qty 2

## 2024-06-18 MED ORDER — APIXABAN 2.5 MG PO TABS
2.5000 mg | ORAL_TABLET | Freq: Two times a day (BID) | ORAL | Status: DC
  Administered 2024-06-18 – 2024-06-19 (×3): 2.5 mg via ORAL
  Filled 2024-06-18 (×3): qty 1

## 2024-06-18 MED ORDER — LACTATED RINGERS IV SOLN: INTRAVENOUS | Status: AC

## 2024-06-18 MED ORDER — INSULIN GLARGINE-YFGN 100 UNIT/ML ~~LOC~~ SOLN: 45.0000 [IU] | Freq: Every day | SUBCUTANEOUS | Status: DC

## 2024-06-18 MED ORDER — ACETAMINOPHEN 650 MG RE SUPP
650.0000 mg | Freq: Four times a day (QID) | RECTAL | Status: DC | PRN
Start: 2024-06-18 — End: 2024-06-22

## 2024-06-18 MED ORDER — SODIUM CHLORIDE 0.9 % IV SOLN: 500.0000 mg | Freq: Once | INTRAVENOUS | Status: DC

## 2024-06-18 MED ORDER — LACTATED RINGERS IV BOLUS (SEPSIS)
1000.0000 mL | Freq: Once | INTRAVENOUS | Status: AC
  Administered 2024-06-18: 1000 mL via INTRAVENOUS

## 2024-06-18 MED ORDER — PANTOPRAZOLE SODIUM 40 MG PO TBEC
40.0000 mg | DELAYED_RELEASE_TABLET | Freq: Every day | ORAL | Status: DC
  Administered 2024-06-18 – 2024-06-22 (×5): 40 mg via ORAL
  Filled 2024-06-18 (×5): qty 1

## 2024-06-18 MED ORDER — AZITHROMYCIN 500 MG PO TABS
500.0000 mg | ORAL_TABLET | Freq: Every day | ORAL | Status: AC
  Administered 2024-06-18 – 2024-06-22 (×5): 500 mg via ORAL
  Filled 2024-06-18 (×5): qty 1

## 2024-06-18 MED ORDER — GUAIFENESIN-DM 100-10 MG/5ML PO SYRP
10.0000 mL | ORAL_SOLUTION | ORAL | Status: DC | PRN
  Administered 2024-06-18 – 2024-06-21 (×11): 10 mL via ORAL
  Filled 2024-06-18 (×12): qty 10

## 2024-06-18 MED ORDER — SODIUM CHLORIDE 0.9 % IV SOLN
2.0000 g | Freq: Once | INTRAVENOUS | Status: DC
  Filled 2024-06-18: qty 12.5

## 2024-06-18 MED ORDER — FENTANYL CITRATE (PF) 50 MCG/ML IJ SOSY
50.0000 ug | PREFILLED_SYRINGE | Freq: Once | INTRAMUSCULAR | Status: AC
  Administered 2024-06-18: 50 ug via INTRAVENOUS
  Filled 2024-06-18: qty 1

## 2024-06-18 MED ORDER — INSULIN ASPART 100 UNIT/ML IJ SOLN: 0.0000 [IU] | Freq: Every day | INTRAMUSCULAR | Status: DC

## 2024-06-18 MED ORDER — ALBUTEROL SULFATE HFA 108 (90 BASE) MCG/ACT IN AERS: 1.0000 | INHALATION_SPRAY | RESPIRATORY_TRACT | Status: DC | PRN

## 2024-06-18 MED ORDER — VANCOMYCIN HCL IN DEXTROSE 1-5 GM/200ML-% IV SOLN
1000.0000 mg | Freq: Once | INTRAVENOUS | Status: AC
  Administered 2024-06-18: 1000 mg via INTRAVENOUS
  Filled 2024-06-18: qty 200

## 2024-06-18 MED ORDER — INSULIN ASPART 100 UNIT/ML IJ SOLN
0.0000 [IU] | Freq: Three times a day (TID) | INTRAMUSCULAR | Status: DC
  Administered 2024-06-18: 5 [IU] via SUBCUTANEOUS
  Administered 2024-06-19 (×2): 3 [IU] via SUBCUTANEOUS
  Administered 2024-06-19: 11 [IU] via SUBCUTANEOUS
  Administered 2024-06-20: 2 [IU] via SUBCUTANEOUS
  Administered 2024-06-20: 8 [IU] via SUBCUTANEOUS
  Filled 2024-06-18: qty 3
  Filled 2024-06-18: qty 11
  Filled 2024-06-18: qty 5
  Filled 2024-06-18: qty 8
  Filled 2024-06-18: qty 3
  Filled 2024-06-18: qty 5
  Filled 2024-06-18: qty 2

## 2024-06-18 MED ORDER — INSULIN ASPART 100 UNIT/ML IJ SOLN
10.0000 [IU] | Freq: Three times a day (TID) | INTRAMUSCULAR | Status: DC
  Administered 2024-06-18 – 2024-06-22 (×11): 10 [IU] via SUBCUTANEOUS
  Filled 2024-06-18 (×12): qty 10

## 2024-06-18 MED ORDER — SODIUM CHLORIDE 0.9 % IV SOLN
2.0000 g | INTRAVENOUS | Status: DC
  Administered 2024-06-19: 2 g via INTRAVENOUS
  Filled 2024-06-18: qty 20

## 2024-06-18 MED ORDER — ALBUTEROL SULFATE (2.5 MG/3ML) 0.083% IN NEBU: 2.5000 mg | INHALATION_SOLUTION | RESPIRATORY_TRACT | Status: DC | PRN

## 2024-06-18 MED ORDER — HYDROCODONE-ACETAMINOPHEN 5-325 MG PO TABS
1.0000 | ORAL_TABLET | ORAL | Status: DC | PRN
  Administered 2024-06-18 – 2024-06-20 (×7): 2 via ORAL
  Administered 2024-06-22: 1 via ORAL
  Filled 2024-06-18 (×2): qty 2
  Filled 2024-06-18: qty 1
  Filled 2024-06-18 (×5): qty 2
  Filled 2024-06-18: qty 1

## 2024-06-18 NOTE — H&P (Signed)
 History and Physical    Patient: Michael Gill FMW:[id number] DOB: 05/03/1961 DOA: 06/18/2024 DOS: the patient was seen and examined on 06/18/2024 PCP: Kandis [name], MD  Patient coming from: Home  Chief Complaint:  Chief Complaint  Patient presents with   Cough   Shortness of Breath   HPI: Michael Gill is a 63 y.o. male with medical history significant of CVA, DM, hx DVT on anticoag, HLD presented with  increased sob and chest discomfort x  weeks. Pt failed outpt abx including augmentin . In ED, pt was found to have WBC12.0  with HR 103. Tmax 99.3 in ED. CXR was notable for LLL pna. Pt was started on empiric azithro, cefepime , and vanc. Hospitalist consulted for consideration for admission  Review of Systems: As mentioned in the history of present illness. All other systems reviewed and are negative. Past Medical History:  Diagnosis Date   Asthma    B12 deficiency    in the past   CAD (coronary artery disease)    s/p MI   Colon cancer (HCC) 1990   Complication of anesthesia    long time to wake up from colonoscopy.   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   Seizure (HCC)    last episode 08/2017. has no warning, will just happen. 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 He has never used smokeless tobacco. He reports that he does not drink alcohol and does not use drugs.  Allergies  Allergen Reactions   Shellfish Allergy Hives    Swelling of throat and mouth. Betadine is NOT a problem   Codeine Other (See Comments)    Reaction:  Hallucinations    Sulfa Antibiotics Hives    Family History  Problem Relation Age of Onset   Alzheimer's disease Mother    Congestive Heart Failure Father    Colon cancer Brother     Prior to Admission medications   Medication Sig Start Date End Date Taking? Authorizing Provider  acetaminophen  (TYLENOL ) 500 MG tablet Take 500 mg by mouth every 6 (six) hours as needed for moderate pain.    [provider]  albuterol  (PROVENTIL  HFA;VENTOLIN  HFA) 108 (90 Base) MCG/ACT inhaler Inhale 1-2 puffs into the lungs every 4 (four) hours as needed for wheezing or shortness of breath.    [provider]  ammonium lactate (AMLACTIN) 12 % cream Apply 1 Application topically 2 (two) times daily. 08/16/23   [provider]  bismuth  subsalicylate (PEPTO BISMOL) 262 MG chewable tablet Chew 524 mg by mouth as needed for indigestion or diarrhea or loose stools.     [provider]  cetirizine (ZYRTEC) 10 MG tablet Take 10 mg by mouth daily.    [provider]  Continuous Glucose Sensor (FREESTYLE LIBRE 2 SENSOR) MISC  08/21/23   [provider]  cyanocobalamin  (VITAMIN B12) 500 MCG tablet Take 1 tablet (500  mcg total) by mouth daily. 06/06/23   Bryn Bernardino NOVAK, MD  docusate sodium  (COLACE) 100 MG capsule Take 100 mg by mouth 2 (two) times daily.    [provider]  ELIQUIS  2.5 MG TABS tablet TAKE (1) TABLET BY MOUTH TWICE DAILY. 02/24/20   Babara Call, MD  fluticasone  (FLONASE ) 50 MCG/ACT nasal spray Place 1 spray into both nostrils daily as needed for allergies or rhinitis.    [provider]  insulin  aspart (NOVOLOG ) 100 UNIT/ML injection Inject 10 Units into the skin 3 (three) times daily with meals.    [provider]  insulin  glargine (LANTUS ) 100 UNIT/ML injection Inject 0.45 mLs (45 Units total) into the skin at bedtime. 03/09/24   Jens Durand, MD  meclizine  (ANTIVERT ) 25 MG tablet Take 25 mg by mouth 3 (three) times daily as needed for dizziness.    [provider]  omeprazole (PRILOSEC) 40 MG capsule Take 40 mg by mouth daily as needed (GERD symptoms).     [provider]    Physical Exam: Vitals:   06/18/24 1051 06/18/24 1052  BP: 131/75   Pulse: (!) 103   Resp: 20   Temp: 99.3 F (37.4 C)   SpO2: 100%   Weight:  101.6 kg  Height:  5' 8 (1.727 m)   General exam: Awake, laying in bed, in nad Respiratory system: Normal respiratory effort, no wheezing Cardiovascular system: regular rate, s1, s2 Gastrointestinal system: Soft, nondistended, positive BS Central nervous system: CN2-12 grossly intact, strength intact Extremities: Perfused, no clubbing Skin: Normal skin turgor, no notable skin lesions seen Psychiatry: Mood normal // no visual hallucinations   Data Reviewed:  Labs reviewed: Na 134, K 3.6, Cr 0.87, WBC 12.0, Hgb 13.9  Assessment and Plan: LLL PNA with sepsis present on admit -Noted on CXR, reviewed -Pt with leukocytosis and tachypnea -Cont on empiric azithro with rocephin  -Cont O2 as needed, wean as tolerated -Recheck CBC in AM -check covid/rsv/flu -Pt has not received flu vaccine this year  Hx DVT -complaining of  marked RLE pain, but states similar pains come and go historically -cont chronic anticoag -LE dopplers pending  DM -Will cont pt on SSI as needed  Hx CVA -seems stable at this time -cont anticoag   Advance Care Planning:   Code Status: Prior full  Consults:   Family Communication: Pt in room, family at bedside  Severity of Illness: The appropriate patient status for this patient is OBSERVATION. Observation status is judged to be reasonable and necessary in order to provide the required intensity of service to ensure the patient's safety. The patient's presenting symptoms, physical exam findings, and initial radiographic and laboratory data in the context of their medical condition is felt  to place them at decreased risk for further clinical deterioration. Furthermore, it is anticipated that the patient will be medically stable for discharge from the hospital within 2 midnights of admission.   Author: Garnette Pelt, MD 06/18/2024 2:22 PM  For on call review [email or web address].

## 2024-06-18 NOTE — Consult Note (Signed)
 CODE SEPSIS - PHARMACY COMMUNICATION  **Broad Spectrum Antibiotics should be administered within 1 hour of Sepsis diagnosis**  Time Code Sepsis Called/Page Received: 1357  Antibiotics Ordered: azithromycin , cefepime , vancomycin   Time of 1st antibiotic administration: 1456  Additional action taken by pharmacy: n/a  If necessary, Name of Provider/Nurse Contacted: n/a    Jarelis Ehlert A Sicilia Killough ,PharmD Clinical Pharmacist  06/18/2024  2:03 PM

## 2024-06-18 NOTE — ED Provider Notes (Signed)
 [facility] Provider Note    Event Date/Time   First MD Initiated Contact with Patient 06/18/24 1332     (approximate)   History   Cough and Shortness of Breath   HPI  Michael Gill is a 63 y.o. male significant for history of pulmonary embolism on Eliquis , hyperlipidemia, diabetes, history of CVA, presents to the emergency department with cough and shortness of breath.  Patient states that he has been having a cough for the past 6 weeks that has significantly worsened over the past week.  Completed a course of antibiotics with Augmentin  and has ongoing cough.  Called primary care physician was told to come over to the emergency department.  Diffuse chest pain to his back and chest wall muscles.  Feels short of breath.  Denies nausea vomiting or diarrhea.  Also complaining of some pain behind his right knee which was his presenting symptom for his prior DVT.  States that he has been compliant with his anticoagulation.     Physical Exam   Triage Vital Signs: ED Triage Vitals  Encounter Vitals Group     BP 06/18/24 1051 131/75     Girls Systolic BP Percentile --      Girls Diastolic BP Percentile --      Boys Systolic BP Percentile --      Boys Diastolic BP Percentile --      Pulse Rate 06/18/24 1051 (!) 103     Resp 06/18/24 1051 20     Temp 06/18/24 1051 99.3 F (37.4 C)     Temp src --      SpO2 06/18/24 1051 100 %     Weight 06/18/24 1052 224 lb (101.6 kg)     Height 06/18/24 1052 5' 8 (1.727 m)     Head Circumference --      Peak Flow --      Pain Score --      Pain Loc --      Pain Education --      Exclude from Growth Chart --     Most recent vital signs: Vitals:   06/18/24 1051  BP: 131/75  Pulse: (!) 103  Resp: 20  Temp: 99.3 F (37.4 C)  SpO2: 100%    Physical Exam Constitutional:      Appearance: He is well-developed.  HENT:     Head: Atraumatic.  Eyes:     Conjunctiva/sclera: Conjunctivae normal.  Cardiovascular:      Rate and Rhythm: Regular rhythm. Tachycardia present.  Pulmonary:     Effort: Tachypnea and accessory muscle usage present. No respiratory distress.     Breath sounds: Rhonchi present.  Chest:     Chest wall: No tenderness.  Musculoskeletal:     Cervical back: Normal range of motion.     Right lower leg: No edema.     Left lower leg: No edema.  Skin:    General: Skin is warm.     Capillary Refill: Capillary refill takes less than 2 seconds.  Neurological:     General: No focal deficit present.     Mental Status: He is alert. Mental status is at baseline.     IMPRESSION / MDM / ASSESSMENT AND PLAN / ED COURSE  I reviewed the triage vital signs and the nursing notes.  Differential diagnosis including viral illness including COVID/influenza, pneumonia, pulmonary embolism, ACS, anemia, DVT  EKG  I, Clotilda Punter, the attending physician, personally viewed and interpreted this ECG.  Rate: Normal  Rhythm: Normal sinus  Axis: Normal  Intervals: Normal  ST&T Change: None  No tachycardic or bradycardic dysrhythmias while on cardiac telemetry.  RADIOLOGY I independently reviewed imaging, my interpretation of imaging: Chest x-ray concerning for left lower lobe pneumonia.  Read as left lower lobe pneumonia  LABS (all labs ordered are listed, but only abnormal results are displayed) Labs interpreted as -    Labs Reviewed  BASIC METABOLIC PANEL WITH GFR - Abnormal; Notable for the following components:      Result Value   Sodium 134 (*)    Glucose, Bld 180 (*)    All other components within normal limits  CBC - Abnormal; Notable for the following components:   WBC 12.0 (*)    All other components within normal limits  CULTURE, BLOOD (ROUTINE X 2)  CULTURE, BLOOD (ROUTINE X 2)  LACTIC ACID, PLASMA  LACTIC ACID, PLASMA  TROPONIN T, HIGH SENSITIVITY     MDM  Clinical picture is most concerning for left lower lobe pneumonia that has failed outpatient treatment.  Mild  leukocytosis.  Creatinine appears to be at baseline with no significant electrolyte abnormality.  Troponin is negative have low suspicion for ACS.  Blood cultures obtained will add on a lactic acid.  Will start on IV antibiotics with Pseudomonas and MRSA risk factors given prior hospitalization in August with IV antibiotics.  Ultrasound DVT to further evaluate for DVT to the right lower extremity.  Much less concern for pulmonary embolism given compliance with his Eliquis .  Consulted hospitalist for admission for complicated pneumonia     PROCEDURES:  Critical Care performed: yes  .Critical Care  Performed by: Suzanne Kirsch, MD Authorized by: Suzanne Kirsch, MD   Critical care provider statement:    Critical care time (minutes):  30   Critical care time was exclusive of:  Separately billable procedures and treating other patients   Critical care was necessary to treat or prevent imminent or life-threatening deterioration of the following conditions:  Sepsis   Critical care was time spent personally by me on the following activities:  Development of treatment plan with patient or surrogate, discussions with consultants, evaluation of patient's response to treatment, examination of patient, ordering and review of laboratory studies, ordering and review of radiographic studies, ordering and performing treatments and interventions, pulse oximetry, re-evaluation of patient's condition and review of old charts   Care discussed with: admitting provider     Patient's presentation is most consistent with acute presentation with potential threat to life or bodily function.   MEDICATIONS ORDERED IN ED: Medications  lactated ringers  infusion (has no administration in time range)  lactated ringers  bolus 1,000 mL (has no administration in time range)  vancomycin  (VANCOCIN ) IVPB 1000 mg/200 mL premix (has no administration in time range)  ceFEPIme  (MAXIPIME ) 2 g in sodium chloride  0.9 % 100 mL IVPB  (has no administration in time range)  azithromycin  (ZITHROMAX ) 500 mg in sodium chloride  0.9 % 250 mL IVPB (has no administration in time range)  fentaNYL  (SUBLIMAZE ) injection 50 mcg (has no administration in time range)    FINAL CLINICAL IMPRESSION(S) / ED DIAGNOSES   Final diagnoses:  Pneumonia of left lower lobe due to infectious organism     Rx / DC Orders   ED Discharge Orders     None        Note:  This document was prepared using Dragon voice recognition software and may include unintentional dictation errors.   Suzanne Kirsch,  MD 06/18/24 1406

## 2024-06-18 NOTE — ED Triage Notes (Signed)
 Pt comes with sob, chest pain, back pain and cough. Pt states pain all over. Pt states cough for 4 weeks but worse now.

## 2024-06-18 NOTE — Sepsis Progress Note (Signed)
 Elink will follow per sepsis protocol.

## 2024-06-19 DIAGNOSIS — Z86711 Personal history of pulmonary embolism: Secondary | ICD-10-CM | POA: Diagnosis not present

## 2024-06-19 DIAGNOSIS — I2510 Atherosclerotic heart disease of native coronary artery without angina pectoris: Secondary | ICD-10-CM | POA: Diagnosis present

## 2024-06-19 DIAGNOSIS — Z882 Allergy status to sulfonamides status: Secondary | ICD-10-CM | POA: Diagnosis not present

## 2024-06-19 DIAGNOSIS — G4730 Sleep apnea, unspecified: Secondary | ICD-10-CM | POA: Diagnosis present

## 2024-06-19 DIAGNOSIS — J45909 Unspecified asthma, uncomplicated: Secondary | ICD-10-CM | POA: Diagnosis present

## 2024-06-19 DIAGNOSIS — Z86718 Personal history of other venous thrombosis and embolism: Secondary | ICD-10-CM | POA: Diagnosis not present

## 2024-06-19 DIAGNOSIS — Z6834 Body mass index (BMI) 34.0-34.9, adult: Secondary | ICD-10-CM | POA: Diagnosis not present

## 2024-06-19 DIAGNOSIS — Z87442 Personal history of urinary calculi: Secondary | ICD-10-CM | POA: Diagnosis not present

## 2024-06-19 DIAGNOSIS — J189 Pneumonia, unspecified organism: Secondary | ICD-10-CM | POA: Diagnosis present

## 2024-06-19 DIAGNOSIS — A419 Sepsis, unspecified organism: Secondary | ICD-10-CM | POA: Diagnosis present

## 2024-06-19 DIAGNOSIS — Z794 Long term (current) use of insulin: Secondary | ICD-10-CM | POA: Diagnosis not present

## 2024-06-19 DIAGNOSIS — Z8249 Family history of ischemic heart disease and other diseases of the circulatory system: Secondary | ICD-10-CM | POA: Diagnosis not present

## 2024-06-19 DIAGNOSIS — Z800 Family history of malignant neoplasm of digestive organs: Secondary | ICD-10-CM | POA: Diagnosis not present

## 2024-06-19 DIAGNOSIS — Z820 Family history of epilepsy and other diseases of the nervous system: Secondary | ICD-10-CM | POA: Diagnosis not present

## 2024-06-19 DIAGNOSIS — E785 Hyperlipidemia, unspecified: Secondary | ICD-10-CM | POA: Diagnosis present

## 2024-06-19 DIAGNOSIS — E66811 Obesity, class 1: Secondary | ICD-10-CM | POA: Diagnosis present

## 2024-06-19 DIAGNOSIS — Z85038 Personal history of other malignant neoplasm of large intestine: Secondary | ICD-10-CM | POA: Diagnosis not present

## 2024-06-19 DIAGNOSIS — K649 Unspecified hemorrhoids: Secondary | ICD-10-CM | POA: Diagnosis present

## 2024-06-19 DIAGNOSIS — I69328 Other speech and language deficits following cerebral infarction: Secondary | ICD-10-CM | POA: Diagnosis not present

## 2024-06-19 DIAGNOSIS — Z885 Allergy status to narcotic agent status: Secondary | ICD-10-CM | POA: Diagnosis not present

## 2024-06-19 DIAGNOSIS — R0602 Shortness of breath: Secondary | ICD-10-CM | POA: Diagnosis present

## 2024-06-19 DIAGNOSIS — E1165 Type 2 diabetes mellitus with hyperglycemia: Secondary | ICD-10-CM | POA: Diagnosis present

## 2024-06-19 DIAGNOSIS — Z7901 Long term (current) use of anticoagulants: Secondary | ICD-10-CM | POA: Diagnosis not present

## 2024-06-19 DIAGNOSIS — I252 Old myocardial infarction: Secondary | ICD-10-CM | POA: Diagnosis not present

## 2024-06-19 DIAGNOSIS — K921 Melena: Secondary | ICD-10-CM | POA: Diagnosis not present

## 2024-06-19 LAB — RESP PANEL BY RT-PCR (RSV, FLU A&B, COVID)  RVPGX2
Influenza A by PCR: NEGATIVE
Influenza B by PCR: NEGATIVE
Resp Syncytial Virus by PCR: NEGATIVE
SARS Coronavirus 2 by RT PCR: NEGATIVE

## 2024-06-19 LAB — CBC
HCT: 35.5 % — ABNORMAL LOW (ref 39.0–52.0)
Hemoglobin: 12.4 g/dL — ABNORMAL LOW (ref 13.0–17.0)
MCH: 30.6 pg (ref 26.0–34.0)
MCHC: 34.9 g/dL (ref 30.0–36.0)
MCV: 87.7 fL (ref 80.0–100.0)
Platelets: 187 K/uL (ref 150–400)
RBC: 4.05 MIL/uL — ABNORMAL LOW (ref 4.22–5.81)
RDW: 12.1 % (ref 11.5–15.5)
WBC: 7.5 K/uL (ref 4.0–10.5)
nRBC: 0 % (ref 0.0–0.2)

## 2024-06-19 LAB — GLUCOSE, CAPILLARY
Glucose-Capillary: 310 mg/dL — ABNORMAL HIGH (ref 70–99)
Glucose-Capillary: 152 mg/dL — ABNORMAL HIGH (ref 70–99)
Glucose-Capillary: 183 mg/dL — ABNORMAL HIGH (ref 70–99)
Glucose-Capillary: 135 mg/dL — ABNORMAL HIGH (ref 70–99)

## 2024-06-19 LAB — COMPREHENSIVE METABOLIC PANEL WITH GFR
ALT: 10 U/L (ref 0–44)
AST: 13 U/L — ABNORMAL LOW (ref 15–41)
Albumin: 3.3 g/dL — ABNORMAL LOW (ref 3.5–5.0)
Alkaline Phosphatase: 78 U/L (ref 38–126)
Anion gap: 8 (ref 5–15)
BUN: 13 mg/dL (ref 8–23)
CO2: 24 mmol/L (ref 22–32)
Calcium: 8.4 mg/dL — ABNORMAL LOW (ref 8.9–10.3)
Chloride: 101 mmol/L (ref 98–111)
Creatinine, Ser: 0.9 mg/dL (ref 0.61–1.24)
GFR, Estimated: >60 mL/min (ref >60)
Glucose, Bld: 316 mg/dL — ABNORMAL HIGH (ref 70–99)
Potassium: 4.3 mmol/L (ref 3.5–5.1)
Sodium: 133 mmol/L — ABNORMAL LOW (ref 135–145)
Total Bilirubin: 0.5 mg/dL (ref 0.0–1.2)
Total Protein: 6.3 g/dL — ABNORMAL LOW (ref 6.5–8.1)

## 2024-06-19 LAB — LACTIC ACID, PLASMA: Lactic Acid, Venous: 1 mmol/L (ref 0.5–1.9)

## 2024-06-19 LAB — MRSA NEXT GEN BY PCR, NASAL: MRSA by PCR Next Gen: DETECTED — AB

## 2024-06-19 MED ORDER — KETOROLAC TROMETHAMINE 15 MG/ML IJ SOLN
15.0000 mg | Freq: Four times a day (QID) | INTRAMUSCULAR | Status: AC | PRN
  Administered 2024-06-19 – 2024-06-20 (×4): 15 mg via INTRAVENOUS
  Filled 2024-06-19 (×4): qty 1

## 2024-06-19 MED ORDER — INSULIN GLARGINE-YFGN 100 UNIT/ML ~~LOC~~ SOLN
30.0000 [IU] | Freq: Every day | SUBCUTANEOUS | Status: DC
  Administered 2024-06-19 – 2024-06-22 (×4): 30 [IU] via SUBCUTANEOUS
  Filled 2024-06-19 (×4): qty 0.3

## 2024-06-19 MED ORDER — VANCOMYCIN HCL IN DEXTROSE 1-5 GM/200ML-% IV SOLN
1000.0000 mg | Freq: Once | INTRAVENOUS | Status: AC
  Administered 2024-06-19: 1000 mg via INTRAVENOUS
  Filled 2024-06-19: qty 200

## 2024-06-19 MED ORDER — CHLORHEXIDINE GLUCONATE CLOTH 2 % EX PADS
6.0000 | MEDICATED_PAD | Freq: Every day | CUTANEOUS | Status: DC
  Administered 2024-06-19 – 2024-06-21 (×3): 6 via TOPICAL

## 2024-06-19 MED ORDER — MUPIROCIN 2 % EX OINT
1.0000 | TOPICAL_OINTMENT | Freq: Two times a day (BID) | CUTANEOUS | Status: DC
  Administered 2024-06-19 – 2024-06-22 (×6): 1 via NASAL
  Filled 2024-06-19: qty 22

## 2024-06-19 MED ORDER — VANCOMYCIN HCL 1250 MG/250ML IV SOLN
1250.0000 mg | INTRAVENOUS | Status: DC
  Administered 2024-06-20 – 2024-06-21 (×2): 1250 mg via INTRAVENOUS
  Filled 2024-06-19 (×2): qty 250

## 2024-06-19 NOTE — Progress Notes (Addendum)
 Progress Note    [name]  FMW:[id number] DOB: Jan 16, 1961  DOA: 06/18/2024 PCP: Kandis [name], MD      Brief Narrative:    Medical records reviewed and are as summarized below:  Michael Gill is a 63 y.o. male with medical history significant for stroke, type II DM, DVT on Eliquis , hyperlipidemia, who presented to the hospital with cough, notes of breath and chest discomfort.  Symptoms initially started about 4 weeks prior to admission.  However symptoms have progressively gotten worse.  He was given Augmentin  in the outpatient setting but symptoms have not improved.  Vitals in the ED showed temperature 99.3 F, respiratory rate 20, pulse 103, BP 131/75, oxygen saturation 100% on room air.   Chest x-ray  IMPRESSION: 1. Patchy opacities in the posterior left lung base concerning for left lower lobe pneumonia.     Assessment/Plan:   Principal Problem:   Pneumonia   Body mass index is 34.06 kg/m.  (Class I obesity)    Community-acquired pneumonia: MRSA screen was positive.  Change IV ceftriaxone  to IV vancomycin .  Continue azithromycin .  Follow-up blood cultures. MRSA screen was positive.  RSV, influenza and SARS-CoV-2 test were negative.   History of DVT: Continue Eliquis .  Venous duplex of right lower extremity was negative for DVT.   Type II DM with hyperglycemia: Start Semglee  30 units daily.  Scheduled NovoLog  10 units 3 times daily with meals.  NovoLog  as needed for hyperglycemia. He takes Lantus  40 units nightly at home.   History of stroke    Diet Order             Diet Carb Modified Fluid consistency: Thin; Room service appropriate? Yes  Diet effective now                                  Consultants: None  Procedures: None    Medications:    apixaban   2.5 mg Oral BID   azithromycin   500 mg Oral Daily   insulin  aspart  0-15 Units Subcutaneous TID WC   insulin  aspart  0-5 Units Subcutaneous QHS    insulin  aspart  10 Units Subcutaneous TID WC   insulin  glargine-yfgn  30 Units Subcutaneous Daily   pantoprazole   40 mg Oral Daily   Continuous Infusions:  cefTRIAXone  (ROCEPHIN )  IV     lactated ringers  40 mL/hr at 06/18/24 1744     Anti-infectives (From admission, onward)    Start     Dose/Rate Route Frequency Ordered Stop   06/18/24 1500  azithromycin  (ZITHROMAX ) tablet 500 mg        500 mg Oral Daily 06/18/24 1447 06/23/24 0959   06/18/24 1400  vancomycin  (VANCOCIN ) IVPB 1000 mg/200 mL premix        1,000 mg 200 mL/hr over 60 Minutes Intravenous  Once 06/18/24 1357 06/18/24 1630   06/18/24 1400  ceFEPIme  (MAXIPIME ) 2 g in sodium chloride  0.9 % 100 mL IVPB  Status:  Discontinued        2 g 200 mL/hr over 30 Minutes Intravenous  Once 06/18/24 1357 06/18/24 1451   06/18/24 1400  azithromycin  (ZITHROMAX ) 500 mg in sodium chloride  0.9 % 250 mL IVPB  Status:  Discontinued        500 mg 250 mL/hr over 60 Minutes Intravenous  Once 06/18/24 1357 06/18/24 1451   06/18/24 1400  cefTRIAXone  (ROCEPHIN ) 2 g in sodium chloride   0.9 % 100 mL IVPB        2 g 200 mL/hr over 30 Minutes Intravenous Every 24 hours 06/18/24 1447 06/23/24 1359              Family Communication/Anticipated D/C date and plan/Code Status   DVT prophylaxis: apixaban  (ELIQUIS ) tablet 2.5 mg Start: 06/18/24 2200 apixaban  (ELIQUIS ) tablet 2.5 mg     Code Status: Full Code  Family Communication: Plan okay discussed with his wife at the bedside Disposition Plan: Plan to discharge home   Status is: Inpatient Remains inpatient appropriate because: Community-acquired pneumonia         Subjective:   Interval events noted.  He complains of cough productive of yellowish and greenish sputum, pleuritic chest pain and shortness of breath.  His wife was at the bedside.  Objective:    Vitals:   06/18/24 1539 06/18/24 1932 06/19/24 0440 06/19/24 0759  BP: 133/80 122/68 126/71 (!) 92/59  Pulse: 86 (!) 105  86 78  Resp: 20 20 20 16   Temp: 98.3 F (36.8 C) 98.5 F (36.9 C) 98.2 F (36.8 C) 98.1 F (36.7 C)  TempSrc: Oral Oral Oral   SpO2: 99% 91% 95% 97%  Weight:      Height:       No data found.   Intake/Output Summary (Last 24 hours) at 06/19/2024 1127 Last data filed at 06/19/2024 1009 Gross per 24 hour  Intake 240 ml  Output 2000 ml  Net -1760 ml   Filed Weights   06/18/24 1052  Weight: 101.6 kg    Exam:  GEN: NAD SKIN: Warm and dry EYES: No pallor or icterus ENT: MMM CV: RRR PULM: Rales and rhonchi heard at left lung base. ABD: soft, ND, NT, +BS CNS: AAO x 3, non focal EXT: No edema or tenderness        Data Reviewed:   I have personally reviewed following labs and imaging studies:  Labs: Labs show the following:   Basic Metabolic Panel: Recent Labs  Lab 06/18/24 1057 06/19/24 0532  NA 134* 133*  K 3.6 4.3  CL 102 101  CO2 22 24  GLUCOSE 180* 316*  BUN 13 13  CREATININE 0.87 0.90  CALCIUM  8.9 8.4*   GFR Estimated Creatinine Clearance: 95.8 mL/min (by C-G formula based on SCr of 0.9 mg/dL). Liver Function Tests: Recent Labs  Lab 06/18/24 1407 06/19/24 0532  AST 12* 13*  ALT 13 10  ALKPHOS 90 78  BILITOT 0.7 0.5  PROT 7.1 6.3*  ALBUMIN 3.8 3.3*   No results for input(s): LIPASE, AMYLASE in the last 168 hours. No results for input(s): AMMONIA in the last 168 hours. Coagulation profile No results for input(s): INR, PROTIME in the last 168 hours.  CBC: Recent Labs  Lab 06/18/24 1057 06/19/24 0532  WBC 12.0* 7.5  HGB 13.9 12.4*  HCT 40.1 35.5*  MCV 88.1 87.7  PLT 176 187   Cardiac Enzymes: No results for input(s): CKTOTAL, CKMB, CKMBINDEX, TROPONINI in the last 168 hours. BNP (last 3 results) No results for input(s): PROBNP in the last 8760 hours. CBG: Recent Labs  Lab 06/18/24 1644 06/18/24 2040 06/19/24 0802  GLUCAP 207* 195* 310*   D-Dimer: No results for input(s): DDIMER in the last 72  hours. Hgb A1c: No results for input(s): HGBA1C in the last 72 hours. Lipid Profile: No results for input(s): CHOL, HDL, LDLCALC, TRIG, CHOLHDL, LDLDIRECT in the last 72 hours. Thyroid  function studies: No results for input(s): TSH,  T4TOTAL, T3FREE, THYROIDAB in the last 72 hours.  Invalid input(s): FREET3 Anemia work up: No results for input(s): VITAMINB12, FOLATE, FERRITIN, TIBC, IRON, RETICCTPCT in the last 72 hours. Sepsis Labs: Recent Labs  Lab 06/18/24 1057 06/18/24 1419 06/18/24 1947 06/19/24 0532  WBC 12.0*  --   --  7.5  LATICACIDVEN  --  1.2 1.0  --     Microbiology Recent Results (from the past 240 hours)  Blood Culture (routine x 2)     Status: None (Preliminary result)   Collection Time: 06/18/24  2:19 PM   Specimen: BLOOD  Result Value Ref Range Status   Specimen Description BLOOD RIGHT ANTECUBITAL  Final   Special Requests   Final    BOTTLES DRAWN AEROBIC AND ANAEROBIC Blood Culture results may not be optimal due to an inadequate volume of blood received in culture bottles   Culture   Final    NO GROWTH < 24 HOURS Performed at [facility], [address], [place], [place] [address]    Report Status PENDING  Incomplete  Blood Culture (routine x 2)     Status: None (Preliminary result)   Collection Time: 06/18/24  2:20 PM   Specimen: BLOOD  Result Value Ref Range Status   Specimen Description BLOOD BLOOD RIGHT HAND  Final   Special Requests   Final    BOTTLES DRAWN AEROBIC AND ANAEROBIC Blood Culture adequate volume   Culture   Final    NO GROWTH < 24 HOURS Performed at [facility], [address], [place], [place] [address]    Report Status PENDING  Incomplete  Resp panel by RT-PCR (RSV, Flu A&B, Covid) Anterior Nasal Swab     Status: None   Collection Time: 06/19/24 12:50 AM   Specimen: Anterior Nasal Swab  Result Value Ref Range Status   SARS Coronavirus 2 by RT PCR NEGATIVE NEGATIVE Final     Comment: (NOTE) SARS-CoV-2 target nucleic acids are NOT DETECTED.  The SARS-CoV-2 RNA is generally detectable in upper respiratory specimens during the acute phase of infection. The lowest concentration of SARS-CoV-2 viral copies this assay can detect is 138 copies/mL. A negative result does not preclude SARS-Cov-2 infection and should not be used as the sole basis for treatment or other patient management decisions. A negative result may occur with  improper specimen collection/handling, submission of specimen other than nasopharyngeal swab, presence of viral mutation(s) within the areas targeted by this assay, and inadequate number of viral copies(<138 copies/mL). A negative result must be combined with clinical observations, patient history, and epidemiological information. The expected result is Negative.  Fact Sheet for Patients:  bloggercourse.com  Fact Sheet for Healthcare Providers:  seriousbroker.it  This test is no t yet approved or cleared by the [place]  FDA and  has been authorized for detection and/or diagnosis of SARS-CoV-2 by FDA under an Emergency Use Authorization (EUA). This EUA will remain  in effect (meaning this test can be used) for the duration of the COVID-19 declaration under Section 564(b)(1) of the Act, 21 U.S.C.section 360bbb-3(b)(1), unless the authorization is terminated  or revoked sooner.       Influenza A by PCR NEGATIVE NEGATIVE Final   Influenza B by PCR NEGATIVE NEGATIVE Final    Comment: (NOTE) The Xpert Xpress SARS-CoV-2/FLU/RSV plus assay is intended as an aid in the diagnosis of influenza from Nasopharyngeal swab specimens and should not be used as a sole basis for treatment. Nasal washings and aspirates are unacceptable for  Xpert Xpress SARS-CoV-2/FLU/RSV testing.  Fact Sheet for Patients: bloggercourse.com  Fact Sheet for Healthcare  Providers: seriousbroker.it  This test is not yet approved or cleared by the [place]  FDA and has been authorized for detection and/or diagnosis of SARS-CoV-2 by FDA under an Emergency Use Authorization (EUA). This EUA will remain in effect (meaning this test can be used) for the duration of the COVID-19 declaration under Section 564(b)(1) of the Act, 21 U.S.C. section 360bbb-3(b)(1), unless the authorization is terminated or revoked.     Resp Syncytial Virus by PCR NEGATIVE NEGATIVE Final    Comment: (NOTE) Fact Sheet for Patients: bloggercourse.com  Fact Sheet for Healthcare Providers: seriousbroker.it  This test is not yet approved or cleared by the [place]  FDA and has been authorized for detection and/or diagnosis of SARS-CoV-2 by FDA under an Emergency Use Authorization (EUA). This EUA will remain in effect (meaning this test can be used) for the duration of the COVID-19 declaration under Section 564(b)(1) of the Act, 21 U.S.C. section 360bbb-3(b)(1), unless the authorization is terminated or revoked.  Performed at [facility], [address] [place], [place] [address]     Procedures and diagnostic studies:  US  Venous Img Lower Unilateral Right Result Date: 06/18/2024 CLINICAL DATA:  RIGHT lower extremity swelling EXAM: RIGHT LOWER EXTREMITY VENOUS DOPPLER ULTRASOUND TECHNIQUE: Gray-scale sonography with compression, as well as color and duplex ultrasound, were performed to evaluate the deep venous system(s) from the level of the common femoral vein through the popliteal and proximal calf veins. COMPARISON:  06/08/2022 FINDINGS: VENOUS Normal compressibility of the common femoral, superficial femoral, and popliteal veins, as well as the visualized calf veins. Visualized portions of profunda femoral vein and great saphenous vein unremarkable. No filling defects to suggest DVT on  grayscale or color Doppler imaging. Doppler waveforms show normal direction of venous flow, normal respiratory plasticity and response to augmentation. Limited views of the contralateral common femoral vein are unremarkable. OTHER None. Limitations: Limited visualization of the RIGHT peroneal vein. IMPRESSION: No right lower extremity DVT. Electronically Signed   By: Aliene Lloyd M.D.   On: 06/18/2024 14:55   DG Chest 2 View Result Date: 06/18/2024 EXAM: 2 VIEW(S) XRAY OF THE CHEST 06/18/2024 11:29:00 AM COMPARISON: 02/25/2019. CLINICAL HISTORY: cp FINDINGS: LUNGS AND PLEURA: Patchy opacities in the posterior left lung base concerning for consolidation in the setting of pneumonia. No pleural effusion. No pneumothorax. HEART AND MEDIASTINUM: No acute abnormality of the cardiac and mediastinal silhouettes. BONES AND SOFT TISSUES: Thoracic degenerative changes. IMPRESSION: 1. Patchy opacities in the posterior left lung base concerning for left lower lobe pneumonia. Electronically signed by: Donnice Mania MD 06/18/2024 11:52 AM EST RP Workstation: [id number]               LOS: 0 days   Elyssa Pendelton  [facility]   Pager on [email or web address]. If 7PM-7AM, please contact night-coverage at [email or web address]     06/19/2024, 11:27 AM

## 2024-06-19 NOTE — Care Management Obs Status (Signed)
 MEDICARE OBSERVATION STATUS NOTIFICATION   Patient Details  Name: Michael Gill MRN: [id number] Date of Birth: 10-24-1960   Medicare Observation Status Notification Given:  Yes    Auden Wettstein W, CMA 06/19/2024, 11:37 AM

## 2024-06-19 NOTE — Inpatient Diabetes Management (Signed)
 Inpatient Diabetes Program Recommendations  AACE/ADA: New Consensus Statement on Inpatient Glycemic Control (2015)  Target Ranges:  Prepandial:   less than 140 mg/dL      Peak postprandial:   less than 180 mg/dL (1-2 hours)      Critically ill patients:  140 - 180 mg/dL    Latest Reference Range & Units 06/18/24 16:44 06/18/24 20:40 06/19/24 08:02  Glucose-Capillary 70 - 99 mg/dL 792 (H)  15 units Novolog   195 (H) 310 (H)  (H): Data is abnormally high    Admit with: LLL Pnuemonia  History: DM  Home DM Meds: Freestyle Libre 2 CGM       Lantus  40 units at HS       Novolog  10 units TID with meals  Current Orders: Novolog  10 units TID with meals     Novolog  Moderate Correction Scale/ SSI (0-15 units) TID AC + HS    MD- Note pt takes Lantus  at home (40 units at HS) CBG 310 this AM  Please consider starting Semglee  30 units daily this AM (75% home dose)  Please start this AM   --Will follow patient during hospitalization--  Adina Rudolpho Arrow RN, MSN, CDCES Diabetes Coordinator Inpatient Glycemic Control Team Team Pager: [phone] (8a-5p)

## 2024-06-19 NOTE — Consult Note (Signed)
 Pharmacy Antibiotic Note  Michael Gill is a 63 y.o. male admitted on 06/18/2024 with cough and concerns for pneumonia.  Pharmacy has been consulted for Vancomycin  dosing.  Plan: Vancomycin  2g IV total as loading dose, followed by: Vancomycin  1250 mg IV Q 12 hrs. Goal AUC 400-550. Expected AUC: 481.4 Expected Cmin: 14.1 SCr used: 0.9, Vd used: 0.72   Height: 5' 8 (172.7 cm) Weight: 101.6 kg (224 lb) IBW/kg (Calculated) : 68.4  Temp (24hrs), Avg:98.2 F (36.8 C), Min:97.9 F (36.6 C), Max:98.5 F (36.9 C)  Recent Labs  Lab 06/18/24 1057 06/18/24 1419 06/18/24 1947 06/19/24 0532  WBC 12.0*  --   --  7.5  CREATININE 0.87  --   --  0.90  LATICACIDVEN  --  1.2 1.0  --     Estimated Creatinine Clearance: 95.8 mL/min (by C-G formula based on SCr of 0.9 mg/dL).    Allergies  Allergen Reactions   Shellfish Allergy Hives    Swelling of throat and mouth. Betadine is NOT a problem   Codeine Other (See Comments)    Reaction:  Hallucinations    Sulfa Antibiotics Hives    Antimicrobials this admission: Azithromycin  11/19 >>  Ceftriaxone  11/20 >>  Vancomycin  11/19 >>  Dose adjustments this admission: N/A  Microbiology results: 11/29 BCx: collected 11/20 MRSA PCR: POSITIVE  Thank you for allowing pharmacy to be a part of this patient's care.  Rozann Holts A Lannie Heaps 06/19/2024 4:18 PM

## 2024-06-19 NOTE — Plan of Care (Signed)
  Problem: Coping: Goal: Ability to adjust to condition or change in health will improve Outcome: Progressing   Problem: Nutritional: Goal: Maintenance of adequate nutrition will improve Outcome: Progressing   Problem: Nutrition: Goal: Adequate nutrition will be maintained Outcome: Progressing   Problem: Elimination: Goal: Will not experience complications related to bowel motility Outcome: Progressing Goal: Will not experience complications related to urinary retention Outcome: Progressing   Problem: Pain Managment: Goal: General experience of comfort will improve and/or be controlled Outcome: Progressing   Problem: Safety: Goal: Ability to remain free from injury will improve Outcome: Progressing   Problem: Activity: Goal: Ability to tolerate increased activity will improve Outcome: Progressing

## 2024-06-19 NOTE — Progress Notes (Signed)
  Chaplain On-Call responded to Spiritual Care Consult Order from Garnette Pelt, MD. The request was for Advance Directives information for the patient.  Chaplain met the patient and his wife Arland, and offered the Advance Directives documents and education. The patient stated his understanding.  Chaplain provided spiritual and emotional support.  Chaplain Bebe Ardean EMERSON Hershal., [facility]

## 2024-06-20 DIAGNOSIS — J189 Pneumonia, unspecified organism: Secondary | ICD-10-CM | POA: Diagnosis not present

## 2024-06-20 LAB — CBC
HCT: 37.1 % — ABNORMAL LOW (ref 39.0–52.0)
HCT: 36.9 % — ABNORMAL LOW (ref 39.0–52.0)
HCT: 37.3 % — ABNORMAL LOW (ref 39.0–52.0)
HCT: 36.9 % — ABNORMAL LOW (ref 39.0–52.0)
Hemoglobin: 12.6 g/dL — ABNORMAL LOW (ref 13.0–17.0)
Hemoglobin: 12.8 g/dL — ABNORMAL LOW (ref 13.0–17.0)
Hemoglobin: 12.6 g/dL — ABNORMAL LOW (ref 13.0–17.0)
Hemoglobin: 12.4 g/dL — ABNORMAL LOW (ref 13.0–17.0)
MCH: 30.4 pg (ref 26.0–34.0)
MCH: 30.5 pg (ref 26.0–34.0)
MCH: 30.3 pg (ref 26.0–34.0)
MCH: 30 pg (ref 26.0–34.0)
MCHC: 34 g/dL (ref 30.0–36.0)
MCHC: 34.7 g/dL (ref 30.0–36.0)
MCHC: 33.8 g/dL (ref 30.0–36.0)
MCHC: 33.6 g/dL (ref 30.0–36.0)
MCV: 89.6 fL (ref 80.0–100.0)
MCV: 87.9 fL (ref 80.0–100.0)
MCV: 89.7 fL (ref 80.0–100.0)
MCV: 89.3 fL (ref 80.0–100.0)
Platelets: 223 K/uL (ref 150–400)
Platelets: 227 K/uL (ref 150–400)
Platelets: 264 K/uL (ref 150–400)
Platelets: 258 K/uL (ref 150–400)
RBC: 4.14 MIL/uL — ABNORMAL LOW (ref 4.22–5.81)
RBC: 4.2 MIL/uL — ABNORMAL LOW (ref 4.22–5.81)
RBC: 4.16 MIL/uL — ABNORMAL LOW (ref 4.22–5.81)
RBC: 4.13 MIL/uL — ABNORMAL LOW (ref 4.22–5.81)
RDW: 12.1 % (ref 11.5–15.5)
RDW: 12 % (ref 11.5–15.5)
RDW: 11.9 % (ref 11.5–15.5)
RDW: 12 % (ref 11.5–15.5)
WBC: 7.4 K/uL (ref 4.0–10.5)
WBC: 7.9 K/uL (ref 4.0–10.5)
WBC: 7.3 K/uL (ref 4.0–10.5)
WBC: 7.8 K/uL (ref 4.0–10.5)
nRBC: 0 % (ref 0.0–0.2)
nRBC: 0 % (ref 0.0–0.2)
nRBC: 0 % (ref 0.0–0.2)
nRBC: 0 % (ref 0.0–0.2)

## 2024-06-20 LAB — CREATININE, SERUM
Creatinine, Ser: 1.01 mg/dL (ref 0.61–1.24)
GFR, Estimated: >60 mL/min (ref >60)

## 2024-06-20 LAB — GLUCOSE, CAPILLARY
Glucose-Capillary: 297 mg/dL — ABNORMAL HIGH (ref 70–99)
Glucose-Capillary: 139 mg/dL — ABNORMAL HIGH (ref 70–99)
Glucose-Capillary: 134 mg/dL — ABNORMAL HIGH (ref 70–99)
Glucose-Capillary: 187 mg/dL — ABNORMAL HIGH (ref 70–99)

## 2024-06-20 MED ORDER — INSULIN ASPART 100 UNIT/ML IJ SOLN
0.0000 [IU] | Freq: Three times a day (TID) | INTRAMUSCULAR | Status: DC
  Administered 2024-06-20: 1 [IU] via SUBCUTANEOUS
  Administered 2024-06-21 (×2): 3 [IU] via SUBCUTANEOUS
  Administered 2024-06-22: 5 [IU] via SUBCUTANEOUS
  Filled 2024-06-20: qty 5
  Filled 2024-06-20 (×2): qty 3

## 2024-06-20 MED ORDER — INSULIN ASPART 100 UNIT/ML IJ SOLN
0.0000 [IU] | Freq: Every day | INTRAMUSCULAR | Status: DC
  Administered 2024-06-21: 2 [IU] via SUBCUTANEOUS
  Filled 2024-06-20: qty 2

## 2024-06-20 MED ORDER — SODIUM CHLORIDE 0.9 % IV BOLUS: 500.0000 mL | Freq: Once | INTRAVENOUS | Status: DC

## 2024-06-20 MED ORDER — BENZONATATE 100 MG PO CAPS
100.0000 mg | ORAL_CAPSULE | Freq: Three times a day (TID) | ORAL | Status: AC
  Administered 2024-06-20 – 2024-06-21 (×3): 100 mg via ORAL
  Filled 2024-06-20 (×3): qty 1

## 2024-06-20 NOTE — Progress Notes (Signed)
 Progress Note    Michael Gill  FMW:[id number] DOB: 1960/11/26  DOA: 06/18/2024 PCP: Kandis [name], MD      Brief Narrative:    Medical records reviewed and are as summarized below:  Michael Gill is a 63 y.o. male with medical history significant for stroke, type II DM, DVT on Eliquis , hyperlipidemia, who presented to the hospital with cough, notes of breath and chest discomfort.  Symptoms initially started about 4 weeks prior to admission.  However symptoms have progressively gotten worse.  He was given Augmentin  in the outpatient setting but symptoms have not improved.  Vitals in the ED showed temperature 99.3 F, respiratory rate 20, pulse 103, BP 131/75, oxygen saturation 100% on room air.   Chest x-ray  IMPRESSION: 1. Patchy opacities in the posterior left lung base concerning for left lower lobe pneumonia.     Assessment/Plan:   Principal Problem:   Pneumonia   Body mass index is 34.06 kg/m.  (Class I obesity)    Community-acquired pneumonia: MRSA screen was positive.  Continue IV vancomycin  and azithromycin .  No growth on blood cultures thus far..  Follow-up blood cultures. MRSA screen was positive.  RSV, influenza and SARS-CoV-2 test were negative.   Rectal bleeding, history of hemorrhoids: Rectal bleeding likely due to hemorrhoids.  No further bleeding reported.  H&H stable at 12.8.   History of DVT: Eliquis  held because of reported rectal bleeding. Plan to resume Eliquis  tomorrow if there is no further bleeding. Venous duplex of right lower extremity was negative for DVT.   Type II DM with hyperglycemia: Continue Semglee , scheduled NovoLog  and NovoLog  as needed for hyperglycemia.   He takes Lantus  40 units nightly at home.   History of stroke    Diet Order             Diet Carb Modified Fluid consistency: Thin; Room service appropriate? Yes  Diet effective now                                   Consultants: None  Procedures: None    Medications:    azithromycin   500 mg Oral Daily   Chlorhexidine  Gluconate Cloth  6 each Topical Daily   insulin  aspart  0-15 Units Subcutaneous TID WC   insulin  aspart  0-5 Units Subcutaneous QHS   insulin  aspart  10 Units Subcutaneous TID WC   insulin  glargine-yfgn  30 Units Subcutaneous Daily   mupirocin  ointment  1 Application Nasal BID   pantoprazole   40 mg Oral Daily   Continuous Infusions:  sodium chloride      vancomycin  1,250 mg (06/20/24 0554)     Anti-infectives (From admission, onward)    Start     Dose/Rate Route Frequency Ordered Stop   06/20/24 0600  vancomycin  (VANCOREADY) IVPB 1250 mg/250 mL        1,250 mg 166.7 mL/hr over 90 Minutes Intravenous Every 24 hours 06/19/24 1705     06/19/24 1630  vancomycin  (VANCOCIN ) IVPB 1000 mg/200 mL premix        1,000 mg 200 mL/hr over 60 Minutes Intravenous  Once 06/19/24 1541 06/19/24 1833   06/18/24 1500  azithromycin  (ZITHROMAX ) tablet 500 mg        500 mg Oral Daily 06/18/24 1447 06/23/24 0959   06/18/24 1400  vancomycin  (VANCOCIN ) IVPB 1000 mg/200 mL premix        1,000 mg 200 mL/hr  over 60 Minutes Intravenous  Once 06/18/24 1357 06/18/24 1630   06/18/24 1400  ceFEPIme  (MAXIPIME ) 2 g in sodium chloride  0.9 % 100 mL IVPB  Status:  Discontinued        2 g 200 mL/hr over 30 Minutes Intravenous  Once 06/18/24 1357 06/18/24 1451   06/18/24 1400  azithromycin  (ZITHROMAX ) 500 mg in sodium chloride  0.9 % 250 mL IVPB  Status:  Discontinued        500 mg 250 mL/hr over 60 Minutes Intravenous  Once 06/18/24 1357 06/18/24 1451   06/18/24 1400  cefTRIAXone  (ROCEPHIN ) 2 g in sodium chloride  0.9 % 100 mL IVPB  Status:  Discontinued        2 g 200 mL/hr over 30 Minutes Intravenous Every 24 hours 06/18/24 1447 06/19/24 1527              Family Communication/Anticipated D/C date and plan/Code Status   DVT prophylaxis: Place and maintain  sequential compression device Start: 06/20/24 0419     Code Status: Full Code  Family Communication: Plan discussed with his wife at the bedside Disposition Plan: Plan to discharge home   Status is: Inpatient Remains inpatient appropriate because: Community-acquired pneumonia         Subjective:   Interval events noted.  He had some rectal bleeding earlier this morning.  He passed some bright red blood.  The second time he moved his bowels there was some small amount of blood on the toilet paper.  He has a history of hemorrhoids and has had episodic rectal bleeding in the past.  He still has a cough and difficulty breathing especially with activity.  No vomiting, abdominal pain, dizziness.  Wife at the bedside.  Objective:    Vitals:   06/19/24 2019 06/20/24 0430 06/20/24 0819 06/20/24 1614  BP: (!) 109/57 (!) 143/67 114/68 117/69  Pulse: 77 97 72 76  Resp:   17   Temp: (!) 97.5 F (36.4 C) 98 F (36.7 C) 97.9 F (36.6 C) (!) 97.5 F (36.4 C)  TempSrc: Oral Oral    SpO2: 98% 98% 97% 100%  Weight:      Height:       No data found.   Intake/Output Summary (Last 24 hours) at 06/20/2024 1629 Last data filed at 06/20/2024 1415 Gross per 24 hour  Intake 920 ml  Output 500 ml  Net 420 ml   Filed Weights   06/18/24 1052  Weight: 101.6 kg    Exam:  GEN: NAD SKIN: Warm and dry EYES: No pallor or icterus ENT: MMM CV: RRR PULM: Rales heard at the left lung base.   ABD: soft, obese, NT, +BS CNS: AAO x 3, non focal EXT: No edema or tenderness        Data Reviewed:   I have personally reviewed following labs and imaging studies:  Labs: Labs show the following:   Basic Metabolic Panel: Recent Labs  Lab 06/18/24 1057 06/19/24 0532 06/20/24 1356  NA 134* 133*  --   K 3.6 4.3  --   CL 102 101  --   CO2 22 24  --   GLUCOSE 180* 316*  --   BUN 13 13  --   CREATININE 0.87 0.90 1.01  CALCIUM  8.9 8.4*  --    GFR Estimated Creatinine Clearance:  85.4 mL/min (by C-G formula based on SCr of 1.01 mg/dL). Liver Function Tests: Recent Labs  Lab 06/18/24 1407 06/19/24 0532  AST 12* 13*  ALT  13 10  ALKPHOS 90 78  BILITOT 0.7 0.5  PROT 7.1 6.3*  ALBUMIN 3.8 3.3*   No results for input(s): LIPASE, AMYLASE in the last 168 hours. No results for input(s): AMMONIA in the last 168 hours. Coagulation profile No results for input(s): INR, PROTIME in the last 168 hours.  CBC: Recent Labs  Lab 06/18/24 1057 06/19/24 0532 06/20/24 0424 06/20/24 1356  WBC 12.0* 7.5 7.4 7.9  HGB 13.9 12.4* 12.6* 12.8*  HCT 40.1 35.5* 37.1* 36.9*  MCV 88.1 87.7 89.6 87.9  PLT 176 187 223 227   Cardiac Enzymes: No results for input(s): CKTOTAL, CKMB, CKMBINDEX, TROPONINI in the last 168 hours. BNP (last 3 results) No results for input(s): PROBNP in the last 8760 hours. CBG: Recent Labs  Lab 06/19/24 1637 06/19/24 2051 06/20/24 0817 06/20/24 1216 06/20/24 1623  GLUCAP 183* 135* 297* 139* 134*   D-Dimer: No results for input(s): DDIMER in the last 72 hours. Hgb A1c: No results for input(s): HGBA1C in the last 72 hours. Lipid Profile: No results for input(s): CHOL, HDL, LDLCALC, TRIG, CHOLHDL, LDLDIRECT in the last 72 hours. Thyroid  function studies: No results for input(s): TSH, T4TOTAL, T3FREE, THYROIDAB in the last 72 hours.  Invalid input(s): FREET3 Anemia work up: No results for input(s): VITAMINB12, FOLATE, FERRITIN, TIBC, IRON, RETICCTPCT in the last 72 hours. Sepsis Labs: Recent Labs  Lab 06/18/24 1057 06/18/24 1419 06/18/24 1947 06/19/24 0532 06/20/24 0424 06/20/24 1356  WBC 12.0*  --   --  7.5 7.4 7.9  LATICACIDVEN  --  1.2 1.0  --   --   --     Microbiology Recent Results (from the past 240 hours)  Blood Culture (routine x 2)     Status: None (Preliminary result)   Collection Time: 06/18/24  2:19 PM   Specimen: BLOOD  Result Value Ref Range Status    Specimen Description BLOOD RIGHT ANTECUBITAL  Final   Special Requests   Final    BOTTLES DRAWN AEROBIC AND ANAEROBIC Blood Culture results may not be optimal due to an inadequate volume of blood received in culture bottles   Culture   Final    NO GROWTH 2 DAYS Performed at [facility], [address], [place], [place] [address]    Report Status PENDING  Incomplete  Blood Culture (routine x 2)     Status: None (Preliminary result)   Collection Time: 06/18/24  2:20 PM   Specimen: BLOOD  Result Value Ref Range Status   Specimen Description BLOOD BLOOD RIGHT HAND  Final   Special Requests   Final    BOTTLES DRAWN AEROBIC AND ANAEROBIC Blood Culture adequate volume   Culture   Final    NO GROWTH 2 DAYS Performed at [facility], [address] [place], [place] [address]    Report Status PENDING  Incomplete  Resp panel by RT-PCR (RSV, Flu A&B, Covid) Anterior Nasal Swab     Status: None   Collection Time: 06/19/24 12:50 AM   Specimen: Anterior Nasal Swab  Result Value Ref Range Status   SARS Coronavirus 2 by RT PCR NEGATIVE NEGATIVE Final    Comment: (NOTE) SARS-CoV-2 target nucleic acids are NOT DETECTED.  The SARS-CoV-2 RNA is generally detectable in upper respiratory specimens during the acute phase of infection. The lowest concentration of SARS-CoV-2 viral copies this assay can detect is 138 copies/mL. A negative result does not preclude SARS-Cov-2 infection and should not be used as the sole basis for treatment  or other patient management decisions. A negative result may occur with  improper specimen collection/handling, submission of specimen other than nasopharyngeal swab, presence of viral mutation(s) within the areas targeted by this assay, and inadequate number of viral copies(<138 copies/mL). A negative result must be combined with clinical observations, patient history, and epidemiological information. The expected result is Negative.  Fact Sheet  for Patients:  bloggercourse.com  Fact Sheet for Healthcare Providers:  seriousbroker.it  This test is no t yet approved or cleared by the [place]  FDA and  has been authorized for detection and/or diagnosis of SARS-CoV-2 by FDA under an Emergency Use Authorization (EUA). This EUA will remain  in effect (meaning this test can be used) for the duration of the COVID-19 declaration under Section 564(b)(1) of the Act, 21 U.S.C.section 360bbb-3(b)(1), unless the authorization is terminated  or revoked sooner.       Influenza A by PCR NEGATIVE NEGATIVE Final   Influenza B by PCR NEGATIVE NEGATIVE Final    Comment: (NOTE) The Xpert Xpress SARS-CoV-2/FLU/RSV plus assay is intended as an aid in the diagnosis of influenza from Nasopharyngeal swab specimens and should not be used as a sole basis for treatment. Nasal washings and aspirates are unacceptable for Xpert Xpress SARS-CoV-2/FLU/RSV testing.  Fact Sheet for Patients: bloggercourse.com  Fact Sheet for Healthcare Providers: seriousbroker.it  This test is not yet approved or cleared by the [place]  FDA and has been authorized for detection and/or diagnosis of SARS-CoV-2 by FDA under an Emergency Use Authorization (EUA). This EUA will remain in effect (meaning this test can be used) for the duration of the COVID-19 declaration under Section 564(b)(1) of the Act, 21 U.S.C. section 360bbb-3(b)(1), unless the authorization is terminated or revoked.     Resp Syncytial Virus by PCR NEGATIVE NEGATIVE Final    Comment: (NOTE) Fact Sheet for Patients: bloggercourse.com  Fact Sheet for Healthcare Providers: seriousbroker.it  This test is not yet approved or cleared by the [place]  FDA and has been authorized for detection and/or diagnosis of SARS-CoV-2 by FDA under an  Emergency Use Authorization (EUA). This EUA will remain in effect (meaning this test can be used) for the duration of the COVID-19 declaration under Section 564(b)(1) of the Act, 21 U.S.C. section 360bbb-3(b)(1), unless the authorization is terminated or revoked.  Performed at [facility], [address] [place], [place] [address]   MRSA Next Gen by PCR, Nasal     Status: Abnormal   Collection Time: 06/19/24  8:50 AM   Specimen: Nasal Mucosa; Nasal Swab  Result Value Ref Range Status   MRSA by PCR Next Gen DETECTED (A) NOT DETECTED Final    Comment: RESULT CALLED TO, READ BACK BY AND VERIFIED WITH: JOESPH GAMBLER RN [phone] 06/19/24 HNM (NOTE) The GeneXpert MRSA Assay (FDA approved for NASAL specimens only), is one component of a comprehensive MRSA colonization surveillance program. It is not intended to diagnose MRSA infection nor to guide or monitor treatment for MRSA infections. Test performance is not FDA approved in patients less than 1 years old. Performed at [facility], [address] [place], [place] [address]     Procedures and diagnostic studies:  No results found.              LOS: 1 day   Evelina Lore  [facility]   Pager on [email or web address]. If 7PM-7AM, please contact night-coverage at [email or web address]     06/20/2024, 4:29 PM

## 2024-06-20 NOTE — Progress Notes (Signed)
       CROSS COVER NOTE  NAME: Michael Gill MRN: [id number] DOB : 11/29/1960    Concern as stated by nurse / staff   Hello the pt told me he was trying to hav a bm and just blood came out. I wasn't able to assess anything. Would it be possible to get some labs drawn. thank you      Pertinent findings on chart review: Admitted for pneumonia On Eliquis  for historyo f DVT  Patient Assessment    Assessment and  Interventions   Assessment:  Hematochezia  Plan: Hold Eliquis  Serial H/H Will give a bolus, given soft BP earlier Consider GI consult in the am SCD for DVT prophylaxis

## 2024-06-20 NOTE — Plan of Care (Signed)
  Problem: Metabolic: Goal: Ability to maintain appropriate glucose levels will improve Outcome: Progressing   Problem: Nutritional: Goal: Maintenance of adequate nutrition will improve Outcome: Progressing   Problem: Clinical Measurements: Goal: Respiratory complications will improve Outcome: Progressing   Problem: Activity: Goal: Risk for activity intolerance will decrease Outcome: Progressing   Problem: Coping: Goal: Level of anxiety will decrease Outcome: Progressing

## 2024-06-20 NOTE — Plan of Care (Signed)
  Problem: Education: Goal: Ability to describe self-care measures that may prevent or decrease complications (Diabetes Survival Skills Education) will improve Outcome: Progressing Goal: Individualized Educational Video(s) Outcome: Progressing   Problem: Coping: Goal: Ability to adjust to condition or change in health will improve Outcome: Progressing   Problem: Fluid Volume: Goal: Ability to maintain a balanced intake and output will improve Outcome: Progressing   Problem: Health Behavior/Discharge Planning: Goal: Ability to identify and utilize available resources and services will improve Outcome: Progressing Goal: Ability to manage health-related needs will improve Outcome: Progressing   Problem: Metabolic: Goal: Ability to maintain appropriate glucose levels will improve Outcome: Progressing   Problem: Nutritional: Goal: Maintenance of adequate nutrition will improve Outcome: Progressing Goal: Progress toward achieving an optimal weight will improve Outcome: Progressing   Problem: Skin Integrity: Goal: Risk for impaired skin integrity will decrease Outcome: Progressing   Problem: Tissue Perfusion: Goal: Adequacy of tissue perfusion will improve Outcome: Progressing   Problem: Education: Goal: Knowledge of General Education information will improve Description: Including pain rating scale, medication(s)/side effects and non-pharmacologic comfort measures Outcome: Progressing   Problem: Health Behavior/Discharge Planning: Goal: Ability to manage health-related needs will improve Outcome: Progressing   Problem: Clinical Measurements: Goal: Ability to maintain clinical measurements within normal limits will improve Outcome: Progressing Goal: Will remain free from infection Outcome: Progressing Goal: Diagnostic test results will improve Outcome: Progressing Goal: Respiratory complications will improve Outcome: Progressing Goal: Cardiovascular complication will  be avoided Outcome: Progressing   Problem: Activity: Goal: Risk for activity intolerance will decrease Outcome: Progressing   Problem: Nutrition: Goal: Adequate nutrition will be maintained Outcome: Progressing   Problem: Coping: Goal: Level of anxiety will decrease Outcome: Progressing   Problem: Elimination: Goal: Will not experience complications related to bowel motility Outcome: Progressing Goal: Will not experience complications related to urinary retention Outcome: Progressing   Problem: Pain Managment: Goal: General experience of comfort will improve and/or be controlled Outcome: Progressing   Problem: Safety: Goal: Ability to remain free from injury will improve Outcome: Progressing   Problem: Skin Integrity: Goal: Risk for impaired skin integrity will decrease Outcome: Progressing   Problem: Activity: Goal: Ability to tolerate increased activity will improve Outcome: Progressing   Problem: Clinical Measurements: Goal: Ability to maintain a body temperature in the normal range will improve Outcome: Progressing   Problem: Respiratory: Goal: Ability to maintain adequate ventilation will improve Outcome: Progressing Goal: Ability to maintain a clear airway will improve Outcome: Progressing   Problem: Education: Goal: Ability to describe self-care measures that may prevent or decrease complications (Diabetes Survival Skills Education) will improve Outcome: Progressing Goal: Individualized Educational Video(s) Outcome: Progressing   Problem: Coping: Goal: Ability to adjust to condition or change in health will improve Outcome: Progressing   Problem: Fluid Volume: Goal: Ability to maintain a balanced intake and output will improve Outcome: Progressing   Problem: Health Behavior/Discharge Planning: Goal: Ability to identify and utilize available resources and services will improve Outcome: Progressing Goal: Ability to manage health-related needs will  improve Outcome: Progressing   Problem: Metabolic: Goal: Ability to maintain appropriate glucose levels will improve Outcome: Progressing   Problem: Nutritional: Goal: Maintenance of adequate nutrition will improve Outcome: Progressing Goal: Progress toward achieving an optimal weight will improve Outcome: Progressing   Problem: Skin Integrity: Goal: Risk for impaired skin integrity will decrease Outcome: Progressing   Problem: Tissue Perfusion: Goal: Adequacy of tissue perfusion will improve Outcome: Progressing

## 2024-06-21 ENCOUNTER — Inpatient Hospital Stay

## 2024-06-21 DIAGNOSIS — J189 Pneumonia, unspecified organism: Secondary | ICD-10-CM | POA: Diagnosis not present

## 2024-06-21 LAB — GLUCOSE, CAPILLARY
Glucose-Capillary: 235 mg/dL — ABNORMAL HIGH (ref 70–99)
Glucose-Capillary: 215 mg/dL — ABNORMAL HIGH (ref 70–99)
Glucose-Capillary: 110 mg/dL — ABNORMAL HIGH (ref 70–99)
Glucose-Capillary: 241 mg/dL — ABNORMAL HIGH (ref 70–99)

## 2024-06-21 LAB — CBC
HCT: 37 % — ABNORMAL LOW (ref 39.0–52.0)
Hemoglobin: 12.5 g/dL — ABNORMAL LOW (ref 13.0–17.0)
MCH: 30.6 pg (ref 26.0–34.0)
MCHC: 33.8 g/dL (ref 30.0–36.0)
MCV: 90.5 fL (ref 80.0–100.0)
Platelets: 239 K/uL (ref 150–400)
RBC: 4.09 MIL/uL — ABNORMAL LOW (ref 4.22–5.81)
RDW: 12 % (ref 11.5–15.5)
WBC: 7.1 K/uL (ref 4.0–10.5)
nRBC: 0 % (ref 0.0–0.2)

## 2024-06-21 MED ORDER — VANCOMYCIN HCL IN DEXTROSE 1-5 GM/200ML-% IV SOLN
1000.0000 mg | Freq: Two times a day (BID) | INTRAVENOUS | Status: DC
  Administered 2024-06-21 – 2024-06-22 (×2): 1000 mg via INTRAVENOUS
  Filled 2024-06-21 (×3): qty 200

## 2024-06-21 MED ORDER — IOHEXOL 350 MG/ML SOLN
75.0000 mL | Freq: Once | INTRAVENOUS | Status: AC | PRN
  Administered 2024-06-21: 75 mL via INTRAVENOUS

## 2024-06-21 MED ORDER — ONDANSETRON HCL 4 MG/2ML IJ SOLN
4.0000 mg | Freq: Four times a day (QID) | INTRAMUSCULAR | Status: DC | PRN
  Administered 2024-06-21: 4 mg via INTRAVENOUS
  Filled 2024-06-21: qty 2

## 2024-06-21 MED ORDER — APIXABAN 5 MG PO TABS
5.0000 mg | ORAL_TABLET | Freq: Two times a day (BID) | ORAL | Status: DC
  Administered 2024-06-21 – 2024-06-22 (×3): 5 mg via ORAL
  Filled 2024-06-21 (×3): qty 1

## 2024-06-21 NOTE — Plan of Care (Signed)
  Problem: Education: Goal: Ability to describe self-care measures that may prevent or decrease complications (Diabetes Survival Skills Education) will improve Outcome: Progressing Goal: Individualized Educational Video(s) Outcome: Progressing   Problem: Coping: Goal: Ability to adjust to condition or change in health will improve Outcome: Progressing   Problem: Fluid Volume: Goal: Ability to maintain a balanced intake and output will improve Outcome: Progressing   Problem: Health Behavior/Discharge Planning: Goal: Ability to identify and utilize available resources and services will improve Outcome: Progressing Goal: Ability to manage health-related needs will improve Outcome: Progressing   Problem: Metabolic: Goal: Ability to maintain appropriate glucose levels will improve Outcome: Progressing   Problem: Nutritional: Goal: Maintenance of adequate nutrition will improve Outcome: Progressing Goal: Progress toward achieving an optimal weight will improve Outcome: Progressing   Problem: Skin Integrity: Goal: Risk for impaired skin integrity will decrease Outcome: Progressing   Problem: Tissue Perfusion: Goal: Adequacy of tissue perfusion will improve Outcome: Progressing   Problem: Education: Goal: Knowledge of General Education information will improve Description: Including pain rating scale, medication(s)/side effects and non-pharmacologic comfort measures Outcome: Progressing   Problem: Health Behavior/Discharge Planning: Goal: Ability to manage health-related needs will improve Outcome: Progressing   Problem: Clinical Measurements: Goal: Ability to maintain clinical measurements within normal limits will improve Outcome: Progressing Goal: Will remain free from infection Outcome: Progressing Goal: Diagnostic test results will improve Outcome: Progressing Goal: Respiratory complications will improve Outcome: Progressing Goal: Cardiovascular complication will  be avoided Outcome: Progressing   Problem: Activity: Goal: Risk for activity intolerance will decrease Outcome: Progressing   Problem: Nutrition: Goal: Adequate nutrition will be maintained Outcome: Progressing   Problem: Coping: Goal: Level of anxiety will decrease Outcome: Progressing   Problem: Elimination: Goal: Will not experience complications related to bowel motility Outcome: Progressing Goal: Will not experience complications related to urinary retention Outcome: Progressing   Problem: Pain Managment: Goal: General experience of comfort will improve and/or be controlled Outcome: Progressing   Problem: Safety: Goal: Ability to remain free from injury will improve Outcome: Progressing   Problem: Skin Integrity: Goal: Risk for impaired skin integrity will decrease Outcome: Progressing   Problem: Activity: Goal: Ability to tolerate increased activity will improve Outcome: Progressing   Problem: Clinical Measurements: Goal: Ability to maintain a body temperature in the normal range will improve Outcome: Progressing   Problem: Respiratory: Goal: Ability to maintain adequate ventilation will improve Outcome: Progressing Goal: Ability to maintain a clear airway will improve Outcome: Progressing   Problem: Education: Goal: Ability to describe self-care measures that may prevent or decrease complications (Diabetes Survival Skills Education) will improve Outcome: Progressing Goal: Individualized Educational Video(s) Outcome: Progressing   Problem: Coping: Goal: Ability to adjust to condition or change in health will improve Outcome: Progressing   Problem: Fluid Volume: Goal: Ability to maintain a balanced intake and output will improve Outcome: Progressing   Problem: Health Behavior/Discharge Planning: Goal: Ability to identify and utilize available resources and services will improve Outcome: Progressing Goal: Ability to manage health-related needs will  improve Outcome: Progressing   Problem: Metabolic: Goal: Ability to maintain appropriate glucose levels will improve Outcome: Progressing   Problem: Nutritional: Goal: Maintenance of adequate nutrition will improve Outcome: Progressing Goal: Progress toward achieving an optimal weight will improve Outcome: Progressing   Problem: Skin Integrity: Goal: Risk for impaired skin integrity will decrease Outcome: Progressing   Problem: Tissue Perfusion: Goal: Adequacy of tissue perfusion will improve Outcome: Progressing

## 2024-06-21 NOTE — Consult Note (Signed)
 Pharmacy Antibiotic Note  Michael Gill is a 63 y.o. male admitted on 06/18/2024 with cough and concerns for pneumonia.  Pharmacy has been consulted for vancomycin  dosing.  Plan:  Modify vancomycin  to 1 g IV q12h --Calculated AUC: 429, Cmin: 12.7 --Scr 1.01, IBW, Vd 0.72 (BMI 34) --Daily Scr per protocol. Levels at steady state or as clinically indicated  Height: 5' 8 (172.7 cm) Weight: 101.6 kg (224 lb) IBW/kg (Calculated) : 68.4  Temp (24hrs), Avg:97.9 F (36.6 C), Min:97.5 F (36.4 C), Max:98.7 F (37.1 C)  Recent Labs  Lab 06/18/24 1057 06/18/24 1419 06/18/24 1947 06/19/24 0532 06/20/24 0424 06/20/24 1356 06/20/24 1625 06/20/24 2226 06/21/24 0411  WBC 12.0*  --   --  7.5 7.4 7.9 7.3 7.8 7.1  CREATININE 0.87  --   --  0.90  --  1.01  --   --   --   LATICACIDVEN  --  1.2 1.0  --   --   --   --   --   --     Estimated Creatinine Clearance: 85.4 mL/min (by C-G formula based on SCr of 1.01 mg/dL).    Allergies  Allergen Reactions   Shellfish Allergy Hives    Swelling of throat and mouth. Betadine is NOT a problem   Codeine Other (See Comments)    Reaction:  Hallucinations    Sulfa Antibiotics Hives    Antimicrobials this admission: Azithromycin  11/19 >>  Ceftriaxone  11/20 x 1 Vancomycin  11/19 >>  Dose adjustments this admission: N/A  Microbiology results: 11/29 BCx: NGTD 11/20 MRSA PCR: POSITIVE  Thank you for allowing pharmacy to be a part of this patient's care.  Michael Gill 06/21/2024 11:43 AM

## 2024-06-21 NOTE — Progress Notes (Signed)
 Progress Note    Michael Gill  FMW:[id number] DOB: 1960/09/01  DOA: 06/18/2024 PCP: Kandis [name], MD      Brief Narrative:    Medical records reviewed and are as summarized below:  Michael Gill is a 63 y.o. male with medical history significant for stroke, type II DM, DVT on Eliquis , hyperlipidemia, who presented to the hospital with cough, notes of breath and chest discomfort.  Symptoms initially started about 4 weeks prior to admission.  However symptoms have progressively gotten worse.  He was given Augmentin  in the outpatient setting but symptoms have not improved.  Vitals in the ED showed temperature 99.3 F, respiratory rate 20, pulse 103, BP 131/75, oxygen saturation 100% on room air.   Chest x-ray  IMPRESSION: 1. Patchy opacities in the posterior left lung base concerning for left lower lobe pneumonia.     Assessment/Plan:   Principal Problem:   Pneumonia   Body mass index is 34.06 kg/m.  (Class I obesity)    Community-acquired pneumonia:  CTA chest was done to rule out acute PE because of persistent shortness of breath and chest pain, and complaint of blood-tinged sputum.  No evidence of PE on CT chest.  CT chest confirmed right lower lobe pneumonia.  Continue IV vancomycin  and azithromycin . No growth on blood cultures thus far. MRSA screen was positive.  RSV, influenza and SARS-CoV-2 test were negative.   Rectal bleeding, history of hemorrhoids: No further rectal bleeding.  Rectal bleeding likely due to hemorrhoids.  H&H stable at 12.8.   History of DVT: Resume Eliquis  Venous duplex of right lower extremity was negative for DVT.   Type II DM with hyperglycemia: Continue Semglee , scheduled NovoLog  and NovoLog  as needed for hyperglycemia.   He takes Lantus  40 units nightly at home.   History of stroke    Diet Order             Diet Carb Modified Fluid consistency: Thin; Room service appropriate? Yes  Diet effective now                                   Consultants: None  Procedures: None    Medications:    apixaban   5 mg Oral BID   azithromycin   500 mg Oral Daily   Chlorhexidine  Gluconate Cloth  6 each Topical Daily   insulin  aspart  0-5 Units Subcutaneous QHS   insulin  aspart  0-9 Units Subcutaneous TID WC   insulin  aspart  10 Units Subcutaneous TID WC   insulin  glargine-yfgn  30 Units Subcutaneous Daily   mupirocin  ointment  1 Application Nasal BID   pantoprazole   40 mg Oral Daily   Continuous Infusions:  vancomycin        Anti-infectives (From admission, onward)    Start     Dose/Rate Route Frequency Ordered Stop   06/21/24 1800  vancomycin  (VANCOCIN ) IVPB 1000 mg/200 mL premix        1,000 mg 200 mL/hr over 60 Minutes Intravenous Every 12 hours 06/21/24 1143     06/20/24 0600  vancomycin  (VANCOREADY) IVPB 1250 mg/250 mL  Status:  Discontinued        1,250 mg 166.7 mL/hr over 90 Minutes Intravenous Every 24 hours 06/19/24 1705 06/21/24 1143   06/19/24 1630  vancomycin  (VANCOCIN ) IVPB 1000 mg/200 mL premix        1,000 mg 200 mL/hr over 60 Minutes Intravenous  Once 06/19/24 1541 06/19/24 1833   06/18/24 1500  azithromycin  (ZITHROMAX ) tablet 500 mg        500 mg Oral Daily 06/18/24 1447 06/23/24 0959   06/18/24 1400  vancomycin  (VANCOCIN ) IVPB 1000 mg/200 mL premix        1,000 mg 200 mL/hr over 60 Minutes Intravenous  Once 06/18/24 1357 06/18/24 1630   06/18/24 1400  ceFEPIme  (MAXIPIME ) 2 g in sodium chloride  0.9 % 100 mL IVPB  Status:  Discontinued        2 g 200 mL/hr over 30 Minutes Intravenous  Once 06/18/24 1357 06/18/24 1451   06/18/24 1400  azithromycin  (ZITHROMAX ) 500 mg in sodium chloride  0.9 % 250 mL IVPB  Status:  Discontinued        500 mg 250 mL/hr over 60 Minutes Intravenous  Once 06/18/24 1357 06/18/24 1451   06/18/24 1400  cefTRIAXone  (ROCEPHIN ) 2 g in sodium chloride  0.9 % 100 mL IVPB  Status:  Discontinued        2 g 200 mL/hr over 30 Minutes  Intravenous Every 24 hours 06/18/24 1447 06/19/24 1527              Family Communication/Anticipated D/C date and plan/Code Status   DVT prophylaxis: Place and maintain sequential compression device Start: 06/20/24 0419 apixaban  (ELIQUIS ) tablet 5 mg     Code Status: Full Code  Family Communication: Plan discussed with his wife at the bedside Disposition Plan: Plan to discharge home   Status is: Inpatient Remains inpatient appropriate because: Community-acquired pneumonia         Subjective:   Interval events noted.  He complains of cough productive of blood-tinged sputum, shortness of breath and chest pain.  No rectal bleeding today.  Objective:    Vitals:   06/20/24 1614 06/20/24 1933 06/21/24 0414 06/21/24 0822  BP: 117/69 (!) 140/74 117/67 139/77  Pulse: 76 83 78 99  Resp:  18 18 17   Temp: (!) 97.5 F (36.4 C) 98.7 F (37.1 C) 97.7 F (36.5 C) 97.8 F (36.6 C)  TempSrc:  Oral Oral Oral  SpO2: 100% 93% 97% 95%  Weight:      Height:       No data found.   Intake/Output Summary (Last 24 hours) at 06/21/2024 1439 Last data filed at 06/21/2024 1300 Gross per 24 hour  Intake 1200 ml  Output 1425 ml  Net -225 ml   Filed Weights   06/18/24 1052  Weight: 101.6 kg    Exam:  GEN: NAD SKIN: Warm and dry EYES: No pallor or icterus ENT: MMM CV: RRR PULM: CTA B ABD: soft, ND, NT, +BS CNS: AAO x 3, non focal EXT: No edema or tenderness        Data Reviewed:   I have personally reviewed following labs and imaging studies:  Labs: Labs show the following:   Basic Metabolic Panel: Recent Labs  Lab 06/18/24 1057 06/19/24 0532 06/20/24 1356  NA 134* 133*  --   K 3.6 4.3  --   CL 102 101  --   CO2 22 24  --   GLUCOSE 180* 316*  --   BUN 13 13  --   CREATININE 0.87 0.90 1.01  CALCIUM  8.9 8.4*  --    GFR Estimated Creatinine Clearance: 85.4 mL/min (by C-G formula based on SCr of 1.01 mg/dL). Liver Function Tests: Recent Labs   Lab 06/18/24 1407 06/19/24 0532  AST 12* 13*  ALT 13 10  ALKPHOS 90  78  BILITOT 0.7 0.5  PROT 7.1 6.3*  ALBUMIN 3.8 3.3*   No results for input(s): LIPASE, AMYLASE in the last 168 hours. No results for input(s): AMMONIA in the last 168 hours. Coagulation profile No results for input(s): INR, PROTIME in the last 168 hours.  CBC: Recent Labs  Lab 06/20/24 0424 06/20/24 1356 06/20/24 1625 06/20/24 2226 06/21/24 0411  WBC 7.4 7.9 7.3 7.8 7.1  HGB 12.6* 12.8* 12.6* 12.4* 12.5*  HCT 37.1* 36.9* 37.3* 36.9* 37.0*  MCV 89.6 87.9 89.7 89.3 90.5  PLT 223 227 264 258 239   Cardiac Enzymes: No results for input(s): CKTOTAL, CKMB, CKMBINDEX, TROPONINI in the last 168 hours. BNP (last 3 results) No results for input(s): PROBNP in the last 8760 hours. CBG: Recent Labs  Lab 06/20/24 1216 06/20/24 1623 06/20/24 2002 06/21/24 0855 06/21/24 1220  GLUCAP 139* 134* 187* 235* 215*   D-Dimer: No results for input(s): DDIMER in the last 72 hours. Hgb A1c: No results for input(s): HGBA1C in the last 72 hours. Lipid Profile: No results for input(s): CHOL, HDL, LDLCALC, TRIG, CHOLHDL, LDLDIRECT in the last 72 hours. Thyroid  function studies: No results for input(s): TSH, T4TOTAL, T3FREE, THYROIDAB in the last 72 hours.  Invalid input(s): FREET3 Anemia work up: No results for input(s): VITAMINB12, FOLATE, FERRITIN, TIBC, IRON, RETICCTPCT in the last 72 hours. Sepsis Labs: Recent Labs  Lab 06/18/24 1419 06/18/24 1947 06/19/24 0532 06/20/24 1356 06/20/24 1625 06/20/24 2226 06/21/24 0411  WBC  --   --    < > 7.9 7.3 7.8 7.1  LATICACIDVEN 1.2 1.0  --   --   --   --   --    < > = values in this interval not displayed.    Microbiology Recent Results (from the past 240 hours)  Blood Culture (routine x 2)     Status: None (Preliminary result)   Collection Time: 06/18/24  2:19 PM   Specimen: BLOOD  Result Value Ref  Range Status   Specimen Description BLOOD RIGHT ANTECUBITAL  Final   Special Requests   Final    BOTTLES DRAWN AEROBIC AND ANAEROBIC Blood Culture results may not be optimal due to an inadequate volume of blood received in culture bottles   Culture   Final    NO GROWTH 3 DAYS Performed at [facility], [address] [place], [place] [address]    Report Status PENDING  Incomplete  Blood Culture (routine x 2)     Status: None (Preliminary result)   Collection Time: 06/18/24  2:20 PM   Specimen: BLOOD  Result Value Ref Range Status   Specimen Description BLOOD BLOOD RIGHT HAND  Final   Special Requests   Final    BOTTLES DRAWN AEROBIC AND ANAEROBIC Blood Culture adequate volume   Culture   Final    NO GROWTH 3 DAYS Performed at [facility], [address] [place], [place] [address]    Report Status PENDING  Incomplete  Resp panel by RT-PCR (RSV, Flu A&B, Covid) Anterior Nasal Swab     Status: None   Collection Time: 06/19/24 12:50 AM   Specimen: Anterior Nasal Swab  Result Value Ref Range Status   SARS Coronavirus 2 by RT PCR NEGATIVE NEGATIVE Final    Comment: (NOTE) SARS-CoV-2 target nucleic acids are NOT DETECTED.  The SARS-CoV-2 RNA is generally detectable in upper respiratory specimens during the acute phase of infection. The lowest concentration of SARS-CoV-2 viral copies this assay can detect  is 138 copies/mL. A negative result does not preclude SARS-Cov-2 infection and should not be used as the sole basis for treatment or other patient management decisions. A negative result may occur with  improper specimen collection/handling, submission of specimen other than nasopharyngeal swab, presence of viral mutation(s) within the areas targeted by this assay, and inadequate number of viral copies(<138 copies/mL). A negative result must be combined with clinical observations, patient history, and epidemiological information. The expected result is  Negative.  Fact Sheet for Patients:  bloggercourse.com  Fact Sheet for Healthcare Providers:  seriousbroker.it  This test is no t yet approved or cleared by the [place]  FDA and  has been authorized for detection and/or diagnosis of SARS-CoV-2 by FDA under an Emergency Use Authorization (EUA). This EUA will remain  in effect (meaning this test can be used) for the duration of the COVID-19 declaration under Section 564(b)(1) of the Act, 21 U.S.C.section 360bbb-3(b)(1), unless the authorization is terminated  or revoked sooner.       Influenza A by PCR NEGATIVE NEGATIVE Final   Influenza B by PCR NEGATIVE NEGATIVE Final    Comment: (NOTE) The Xpert Xpress SARS-CoV-2/FLU/RSV plus assay is intended as an aid in the diagnosis of influenza from Nasopharyngeal swab specimens and should not be used as a sole basis for treatment. Nasal washings and aspirates are unacceptable for Xpert Xpress SARS-CoV-2/FLU/RSV testing.  Fact Sheet for Patients: bloggercourse.com  Fact Sheet for Healthcare Providers: seriousbroker.it  This test is not yet approved or cleared by the [place]  FDA and has been authorized for detection and/or diagnosis of SARS-CoV-2 by FDA under an Emergency Use Authorization (EUA). This EUA will remain in effect (meaning this test can be used) for the duration of the COVID-19 declaration under Section 564(b)(1) of the Act, 21 U.S.C. section 360bbb-3(b)(1), unless the authorization is terminated or revoked.     Resp Syncytial Virus by PCR NEGATIVE NEGATIVE Final    Comment: (NOTE) Fact Sheet for Patients: bloggercourse.com  Fact Sheet for Healthcare Providers: seriousbroker.it  This test is not yet approved or cleared by the [place]  FDA and has been authorized for detection and/or diagnosis of  SARS-CoV-2 by FDA under an Emergency Use Authorization (EUA). This EUA will remain in effect (meaning this test can be used) for the duration of the COVID-19 declaration under Section 564(b)(1) of the Act, 21 U.S.C. section 360bbb-3(b)(1), unless the authorization is terminated or revoked.  Performed at [facility], [address] [place], [place] [address]   MRSA Next Gen by PCR, Nasal     Status: Abnormal   Collection Time: 06/19/24  8:50 AM   Specimen: Nasal Mucosa; Nasal Swab  Result Value Ref Range Status   MRSA by PCR Next Gen DETECTED (A) NOT DETECTED Final    Comment: RESULT CALLED TO, READ BACK BY AND VERIFIED WITH: JOESPH GAMBLER RN [phone] 06/19/24 HNM (NOTE) The GeneXpert MRSA Assay (FDA approved for NASAL specimens only), is one component of a comprehensive MRSA colonization surveillance program. It is not intended to diagnose MRSA infection nor to guide or monitor treatment for MRSA infections. Test performance is not FDA approved in patients less than 45 years old. Performed at [facility], [address] [place], [place] [address]     Procedures and diagnostic studies:  CT Angio Chest Pulmonary Embolism (PE) W or WO Contrast Result Date: 06/21/2024 CLINICAL DATA:  Concern for pulmonary embolism. EXAM: CT ANGIOGRAPHY CHEST WITH CONTRAST TECHNIQUE: Multidetector CT imaging of the  chest was performed using the standard protocol during bolus administration of intravenous contrast. Multiplanar CT image reconstructions and MIPs were obtained to evaluate the vascular anatomy. RADIATION DOSE REDUCTION: This exam was performed according to the departmental dose-optimization program which includes automated exposure control, adjustment of the mA and/or kV according to patient size and/or use of iterative reconstruction technique. CONTRAST:  75mL OMNIPAQUE  IOHEXOL  350 MG/ML SOLN COMPARISON:  CT dated 05/07/2019. FINDINGS: Evaluation of this exam is limited  due to respiratory motion. Cardiovascular: There is no cardiomegaly or pericardial effusion. Mild atherosclerotic calcification of the thoracic aorta. No aneurysmal dilatation or dissection. The origins of the great vessels of the aortic arch are patent. Evaluation of the pulmonary arteries is limited due to respiratory motion and suboptimal visualization of the peripheral branches. No central pulmonary artery embolus identified. Mediastinum/Nodes: Mildly enlarged right hilar lymph node measures 13 mm short axis. Subcarinal lymph node measures 18 mm. The esophagus is grossly unremarkable no mediastinal fluid collection. Lungs/Pleura: Trace right pleural effusion. An area of consolidative change at the right lung base consistent with pneumonia. Follow-up to resolution recommended. There is no pneumothorax. The central airways are patent. Upper Abdomen: No acute abnormality. Musculoskeletal: No acute osseous pathology. Review of the MIP images confirms the above findings. IMPRESSION: 1. No CT evidence of central pulmonary artery embolus. 2. Right lower lobe pneumonia. Follow-up to resolution recommended. 3. Trace right pleural effusion. 4.  Aortic Atherosclerosis (ICD10-I70.0). Electronically Signed   By: Vanetta Chou M.D.   On: 06/21/2024 11:17                LOS: 2 days   Cristela Stalder  [facility]   Pager on [email or web address]. If 7PM-7AM, please contact night-coverage at [email or web address]     06/21/2024, 2:39 PM

## 2024-06-21 NOTE — Plan of Care (Signed)
 Problem: Education: Goal: Ability to describe self-care measures that may prevent or decrease complications (Diabetes Survival Skills Education) will improve 06/21/2024 1726 by Leonce Marshall HERO, LPN Outcome: Progressing 06/21/2024 1633 by Leonce Marshall HERO, LPN Outcome: Progressing Goal: Individualized Educational Video(s) 06/21/2024 1726 by Leonce Marshall HERO, LPN Outcome: Progressing 06/21/2024 1633 by Leonce Marshall HERO, LPN Outcome: Progressing   Problem: Coping: Goal: Ability to adjust to condition or change in health will improve 06/21/2024 1726 by Leonce Marshall HERO, LPN Outcome: Progressing 06/21/2024 1633 by Leonce Marshall HERO, LPN Outcome: Progressing   Problem: Fluid Volume: Goal: Ability to maintain a balanced intake and output will improve 06/21/2024 1726 by Leonce Marshall HERO, LPN Outcome: Progressing 06/21/2024 1633 by Leonce Marshall HERO, LPN Outcome: Progressing   Problem: Health Behavior/Discharge Planning: Goal: Ability to identify and utilize available resources and services will improve 06/21/2024 1726 by Leonce Marshall HERO, LPN Outcome: Progressing 06/21/2024 1633 by Leonce Marshall HERO, LPN Outcome: Progressing Goal: Ability to manage health-related needs will improve 06/21/2024 1726 by Leonce Marshall HERO, LPN Outcome: Progressing 06/21/2024 1633 by Leonce Marshall HERO, LPN Outcome: Progressing   Problem: Metabolic: Goal: Ability to maintain appropriate glucose levels will improve 06/21/2024 1726 by Leonce Marshall HERO, LPN Outcome: Progressing 06/21/2024 1633 by Leonce Marshall HERO, LPN Outcome: Progressing   Problem: Nutritional: Goal: Maintenance of adequate nutrition will improve 06/21/2024 1726 by Leonce Marshall HERO, LPN Outcome: Progressing 06/21/2024 1633 by Leonce Marshall HERO, LPN Outcome: Progressing Goal: Progress toward achieving an optimal weight will improve 06/21/2024 1726 by Leonce Marshall HERO, LPN Outcome: Progressing 06/21/2024 1633 by  Leonce Marshall HERO, LPN Outcome: Progressing   Problem: Skin Integrity: Goal: Risk for impaired skin integrity will decrease 06/21/2024 1726 by Leonce Marshall HERO, LPN Outcome: Progressing 06/21/2024 1633 by Leonce Marshall HERO, LPN Outcome: Progressing   Problem: Tissue Perfusion: Goal: Adequacy of tissue perfusion will improve 06/21/2024 1726 by Leonce Marshall HERO, LPN Outcome: Progressing 06/21/2024 1633 by Leonce Marshall HERO, LPN Outcome: Progressing   Problem: Education: Goal: Knowledge of General Education information will improve Description: Including pain rating scale, medication(s)/side effects and non-pharmacologic comfort measures 06/21/2024 1726 by Leonce Marshall HERO, LPN Outcome: Progressing 06/21/2024 1633 by Leonce Marshall HERO, LPN Outcome: Progressing   Problem: Health Behavior/Discharge Planning: Goal: Ability to manage health-related needs will improve 06/21/2024 1726 by Leonce Marshall HERO, LPN Outcome: Progressing 06/21/2024 1633 by Leonce Marshall HERO, LPN Outcome: Progressing   Problem: Clinical Measurements: Goal: Ability to maintain clinical measurements within normal limits will improve 06/21/2024 1726 by Leonce Marshall HERO, LPN Outcome: Progressing 06/21/2024 1633 by Leonce Marshall HERO, LPN Outcome: Progressing Goal: Will remain free from infection 06/21/2024 1726 by Leonce Marshall HERO, LPN Outcome: Progressing 06/21/2024 1633 by Leonce Marshall HERO, LPN Outcome: Progressing Goal: Diagnostic test results will improve 06/21/2024 1726 by Leonce Marshall HERO, LPN Outcome: Progressing 06/21/2024 1633 by Leonce Marshall HERO, LPN Outcome: Progressing Goal: Respiratory complications will improve 06/21/2024 1726 by Leonce Marshall HERO, LPN Outcome: Progressing 06/21/2024 1633 by Leonce Marshall HERO, LPN Outcome: Progressing Goal: Cardiovascular complication will be avoided 06/21/2024 1726 by Leonce Marshall HERO, LPN Outcome: Progressing 06/21/2024 1633 by Leonce Marshall HERO, LPN Outcome: Progressing   Problem: Activity: Goal: Risk for activity intolerance will decrease 06/21/2024 1726 by Leonce Marshall HERO, LPN Outcome: Progressing 06/21/2024 1633 by Leonce Marshall HERO, LPN Outcome: Progressing   Problem: Nutrition: Goal: Adequate nutrition will be maintained 06/21/2024 1726 by Leonce Marshall HERO, LPN Outcome: Progressing 06/21/2024 1633 by Leonce Marshall HERO, LPN Outcome: Progressing  Problem: Coping: Goal: Level of anxiety will decrease 06/21/2024 1726 by Leonce Marshall HERO, LPN Outcome: Progressing 06/21/2024 1633 by Leonce Marshall HERO, LPN Outcome: Progressing   Problem: Elimination: Goal: Will not experience complications related to bowel motility 06/21/2024 1726 by Leonce Marshall HERO, LPN Outcome: Progressing 06/21/2024 1633 by Leonce Marshall HERO, LPN Outcome: Progressing Goal: Will not experience complications related to urinary retention 06/21/2024 1726 by Leonce Marshall HERO, LPN Outcome: Progressing 06/21/2024 1633 by Leonce Marshall HERO, LPN Outcome: Progressing   Problem: Pain Managment: Goal: General experience of comfort will improve and/or be controlled 06/21/2024 1726 by Leonce Marshall HERO, LPN Outcome: Progressing 06/21/2024 1633 by Leonce Marshall HERO, LPN Outcome: Progressing   Problem: Safety: Goal: Ability to remain free from injury will improve 06/21/2024 1726 by Leonce Marshall HERO, LPN Outcome: Progressing 06/21/2024 1633 by Leonce Marshall HERO, LPN Outcome: Progressing   Problem: Skin Integrity: Goal: Risk for impaired skin integrity will decrease 06/21/2024 1726 by Leonce Marshall HERO, LPN Outcome: Progressing 06/21/2024 1633 by Leonce Marshall HERO, LPN Outcome: Progressing   Problem: Activity: Goal: Ability to tolerate increased activity will improve 06/21/2024 1726 by Leonce Marshall HERO, LPN Outcome: Progressing 06/21/2024 1633 by Leonce Marshall HERO, LPN Outcome: Progressing   Problem: Clinical  Measurements: Goal: Ability to maintain a body temperature in the normal range will improve 06/21/2024 1726 by Leonce Marshall HERO, LPN Outcome: Progressing 06/21/2024 1633 by Leonce Marshall HERO, LPN Outcome: Progressing   Problem: Respiratory: Goal: Ability to maintain adequate ventilation will improve 06/21/2024 1726 by Leonce Marshall HERO, LPN Outcome: Progressing 06/21/2024 1633 by Leonce Marshall HERO, LPN Outcome: Progressing Goal: Ability to maintain a clear airway will improve 06/21/2024 1726 by Leonce Marshall HERO, LPN Outcome: Progressing 06/21/2024 1633 by Leonce Marshall HERO, LPN Outcome: Progressing   Problem: Education: Goal: Ability to describe self-care measures that may prevent or decrease complications (Diabetes Survival Skills Education) will improve 06/21/2024 1726 by Leonce Marshall HERO, LPN Outcome: Progressing 06/21/2024 1633 by Leonce Marshall HERO, LPN Outcome: Progressing Goal: Individualized Educational Video(s) 06/21/2024 1726 by Leonce Marshall HERO, LPN Outcome: Progressing 06/21/2024 1633 by Leonce Marshall HERO, LPN Outcome: Progressing   Problem: Coping: Goal: Ability to adjust to condition or change in health will improve 06/21/2024 1726 by Leonce Marshall HERO, LPN Outcome: Progressing 06/21/2024 1633 by Leonce Marshall HERO, LPN Outcome: Progressing   Problem: Fluid Volume: Goal: Ability to maintain a balanced intake and output will improve 06/21/2024 1726 by Leonce Marshall HERO, LPN Outcome: Progressing 06/21/2024 1633 by Leonce Marshall HERO, LPN Outcome: Progressing   Problem: Health Behavior/Discharge Planning: Goal: Ability to identify and utilize available resources and services will improve 06/21/2024 1726 by Leonce Marshall HERO, LPN Outcome: Progressing 06/21/2024 1633 by Leonce Marshall HERO, LPN Outcome: Progressing Goal: Ability to manage health-related needs will improve 06/21/2024 1726 by Leonce Marshall HERO, LPN Outcome: Progressing 06/21/2024 1633 by  Leonce Marshall HERO, LPN Outcome: Progressing   Problem: Metabolic: Goal: Ability to maintain appropriate glucose levels will improve 06/21/2024 1726 by Leonce Marshall HERO, LPN Outcome: Progressing 06/21/2024 1633 by Leonce Marshall HERO, LPN Outcome: Progressing   Problem: Nutritional: Goal: Maintenance of adequate nutrition will improve 06/21/2024 1726 by Leonce Marshall HERO, LPN Outcome: Progressing 06/21/2024 1633 by Leonce Marshall HERO, LPN Outcome: Progressing Goal: Progress toward achieving an optimal weight will improve 06/21/2024 1726 by Leonce Marshall HERO, LPN Outcome: Progressing 06/21/2024 1633 by Leonce Marshall HERO, LPN Outcome: Progressing   Problem: Skin Integrity: Goal: Risk for impaired skin integrity will decrease 06/21/2024 1726 by Leonce,  Marshall HERO, LPN Outcome: Progressing 06/21/2024 1633 by Leonce Marshall HERO, LPN Outcome: Progressing   Problem: Tissue Perfusion: Goal: Adequacy of tissue perfusion will improve 06/21/2024 1726 by Leonce Marshall HERO, LPN Outcome: Progressing 06/21/2024 1633 by Leonce Marshall HERO, LPN Outcome: Progressing

## 2024-06-22 DIAGNOSIS — J189 Pneumonia, unspecified organism: Secondary | ICD-10-CM | POA: Diagnosis not present

## 2024-06-22 LAB — CBC
HCT: 39.1 % (ref 39.0–52.0)
Hemoglobin: 13 g/dL (ref 13.0–17.0)
MCH: 29.5 pg (ref 26.0–34.0)
MCHC: 33.2 g/dL (ref 30.0–36.0)
MCV: 88.9 fL (ref 80.0–100.0)
Platelets: 259 K/uL (ref 150–400)
RBC: 4.4 MIL/uL (ref 4.22–5.81)
RDW: 12.1 % (ref 11.5–15.5)
WBC: 8 K/uL (ref 4.0–10.5)
nRBC: 0 % (ref 0.0–0.2)

## 2024-06-22 LAB — BASIC METABOLIC PANEL WITH GFR
Anion gap: 10 (ref 5–15)
BUN: 13 mg/dL (ref 8–23)
CO2: 26 mmol/L (ref 22–32)
Calcium: 9.2 mg/dL (ref 8.9–10.3)
Chloride: 102 mmol/L (ref 98–111)
Creatinine, Ser: 0.92 mg/dL (ref 0.61–1.24)
GFR, Estimated: >60 mL/min (ref >60)
Glucose, Bld: 275 mg/dL — ABNORMAL HIGH (ref 70–99)
Potassium: 4.4 mmol/L (ref 3.5–5.1)
Sodium: 137 mmol/L (ref 135–145)

## 2024-06-22 LAB — GLUCOSE, CAPILLARY: Glucose-Capillary: 261 mg/dL — ABNORMAL HIGH (ref 70–99)

## 2024-06-22 LAB — PRO BRAIN NATRIURETIC PEPTIDE: Pro Brain Natriuretic Peptide: <50 pg/mL (ref <300.0)

## 2024-06-22 MED ORDER — APIXABAN 2.5 MG PO TABS: 2.5000 mg | ORAL_TABLET | Freq: Two times a day (BID) | ORAL | Status: DC

## 2024-06-22 MED ORDER — INSULIN GLARGINE 100 UNIT/ML ~~LOC~~ SOLN: 40.0000 [IU] | Freq: Every day | SUBCUTANEOUS | Status: AC

## 2024-06-22 NOTE — Plan of Care (Signed)
  Problem: Education: Goal: Ability to describe self-care measures that may prevent or decrease complications (Diabetes Survival Skills Education) will improve Outcome: Progressing Goal: Individualized Educational Video(s) Outcome: Progressing   Problem: Coping: Goal: Ability to adjust to condition or change in health will improve Outcome: Progressing   Problem: Fluid Volume: Goal: Ability to maintain a balanced intake and output will improve Outcome: Progressing   Problem: Health Behavior/Discharge Planning: Goal: Ability to identify and utilize available resources and services will improve Outcome: Progressing Goal: Ability to manage health-related needs will improve Outcome: Progressing   Problem: Metabolic: Goal: Ability to maintain appropriate glucose levels will improve Outcome: Progressing   Problem: Nutritional: Goal: Maintenance of adequate nutrition will improve Outcome: Progressing Goal: Progress toward achieving an optimal weight will improve Outcome: Progressing   Problem: Skin Integrity: Goal: Risk for impaired skin integrity will decrease Outcome: Progressing   Problem: Tissue Perfusion: Goal: Adequacy of tissue perfusion will improve Outcome: Progressing   Problem: Education: Goal: Knowledge of General Education information will improve Description: Including pain rating scale, medication(s)/side effects and non-pharmacologic comfort measures Outcome: Progressing   Problem: Health Behavior/Discharge Planning: Goal: Ability to manage health-related needs will improve Outcome: Progressing   Problem: Clinical Measurements: Goal: Ability to maintain clinical measurements within normal limits will improve Outcome: Progressing Goal: Will remain free from infection Outcome: Progressing Goal: Diagnostic test results will improve Outcome: Progressing Goal: Respiratory complications will improve Outcome: Progressing Goal: Cardiovascular complication will  be avoided Outcome: Progressing   Problem: Activity: Goal: Risk for activity intolerance will decrease Outcome: Progressing   Problem: Nutrition: Goal: Adequate nutrition will be maintained Outcome: Progressing   Problem: Coping: Goal: Level of anxiety will decrease Outcome: Progressing   Problem: Elimination: Goal: Will not experience complications related to bowel motility Outcome: Progressing Goal: Will not experience complications related to urinary retention Outcome: Progressing   Problem: Pain Managment: Goal: General experience of comfort will improve and/or be controlled Outcome: Progressing   Problem: Safety: Goal: Ability to remain free from injury will improve Outcome: Progressing   Problem: Skin Integrity: Goal: Risk for impaired skin integrity will decrease Outcome: Progressing   Problem: Activity: Goal: Ability to tolerate increased activity will improve Outcome: Progressing   Problem: Clinical Measurements: Goal: Ability to maintain a body temperature in the normal range will improve Outcome: Progressing   Problem: Respiratory: Goal: Ability to maintain adequate ventilation will improve Outcome: Progressing Goal: Ability to maintain a clear airway will improve Outcome: Progressing   Problem: Education: Goal: Ability to describe self-care measures that may prevent or decrease complications (Diabetes Survival Skills Education) will improve Outcome: Progressing Goal: Individualized Educational Video(s) Outcome: Progressing   Problem: Coping: Goal: Ability to adjust to condition or change in health will improve Outcome: Progressing   Problem: Fluid Volume: Goal: Ability to maintain a balanced intake and output will improve Outcome: Progressing   Problem: Health Behavior/Discharge Planning: Goal: Ability to identify and utilize available resources and services will improve Outcome: Progressing Goal: Ability to manage health-related needs will  improve Outcome: Progressing   Problem: Metabolic: Goal: Ability to maintain appropriate glucose levels will improve Outcome: Progressing   Problem: Nutritional: Goal: Maintenance of adequate nutrition will improve Outcome: Progressing Goal: Progress toward achieving an optimal weight will improve Outcome: Progressing   Problem: Skin Integrity: Goal: Risk for impaired skin integrity will decrease Outcome: Progressing   Problem: Tissue Perfusion: Goal: Adequacy of tissue perfusion will improve Outcome: Progressing

## 2024-06-22 NOTE — Progress Notes (Signed)
 Mobility Specialist Progress Note:    06/22/24 0910  Mobility  Activity Ambulated with assistance  Level of Assistance Standby assist, set-up cues, supervision of patient - no hands on  Assistive Device Front wheel walker  Distance Ambulated (ft) 22 ft  Range of Motion/Exercises Active;All extremities  Activity Response Tolerated well  Mobility visit 1 Mobility  Mobility Specialist Start Time (ACUTE ONLY) [id number]  Mobility Specialist Stop Time (ACUTE ONLY) 0910  Mobility Specialist Time Calculation (min) (ACUTE ONLY) 7 min   Assisted pt with ambulation to bathroom. Required SBA to stand and ambulate with RW. Tolerated well, left pt sitting EOB with RN at bedside. All needs met.  Sherrilee Ditty Mobility Specialist Please contact via Special Educational Needs Teacher or  Rehab office at [phone]

## 2024-06-22 NOTE — Progress Notes (Signed)
 pt still c/o swollen RLE and burning and tingling now. recommended elevating leg, provider notified.  Will continue to monitor.

## 2024-06-22 NOTE — Discharge Summary (Signed)
 Physician Discharge Summary   Patient: Michael Gill MRN: [id number] DOB: October 27, 1960  Admit date:     06/18/2024  Discharge date: 06/22/24  Discharge Physician: AIDA CHO   PCP: Kandis [name], MD   Recommendations at discharge:   Follow-up with PCP in 1 week  Discharge Diagnoses: Principal Problem:   Pneumonia  Resolved Problems:   * No resolved hospital problems. *  Hospital Course:  Michael Gill is a 63 y.o. male with medical history significant for stroke, type II DM, DVT on Eliquis , hyperlipidemia, who presented to the hospital with cough, notes of breath and chest discomfort.  Symptoms initially started about 4 weeks prior to admission.  However symptoms have progressively gotten worse.  He was given Augmentin  in the outpatient setting but symptoms have not improved.   Vitals in the ED showed temperature 99.3 F, respiratory rate 20, pulse 103, BP 131/75, oxygen saturation 100% on room air.     Chest x-ray   IMPRESSION: 1. Patchy opacities in the posterior left lung base concerning for left lower lobe pneumonia.        Assessment and Plan:  Community-acquired pneumonia: Improved. CTA chest did not show any evidence of pulmonary embolism but did confirm right lower lobe pneumonia. proBNP was normal.. He completed 5 days of IV vancomycin  and azithromycin . No growth on blood cultures. MRSA screen was positive.  RSV, influenza and SARS-CoV-2 test were negative.     Rectal bleeding, history of hemorrhoids: No further rectal bleeding.  Rectal bleeding likely due to hemorrhoids.  H&H stable at 13.     History of DVT: Continue Eliquis  Venous duplex of right lower extremity was negative for DVT. Patient said he has no history of atrial fibrillation    Type II DM with hyperglycemia: Continue Lantus  and NovoLog  at discharge     History of stroke      His condition has improved and is deemed stable for discharge home today. Discharge plan was  discussed with the patient and his wife at the bedside.       Consultants: None Procedures performed: None Disposition: Home Diet recommendation:  Discharge Diet Orders (From admission, onward)     Start     Ordered   06/22/24 0000  Diet - low sodium heart healthy        06/22/24 1116           Cardiac and Carb modified diet DISCHARGE MEDICATION: Allergies as of 06/22/2024       Reactions   Shellfish Allergy Hives   Swelling of throat and mouth. Betadine is NOT a problem   Codeine Other (See Comments)   Reaction:  Hallucinations    Sulfa Antibiotics Hives        Medication List     STOP taking these medications    cyanocobalamin  500 MCG tablet Commonly known as: VITAMIN B12       TAKE these medications    acetaminophen  500 MG tablet Commonly known as: TYLENOL  Take 500 mg by mouth every 6 (six) hours as needed for moderate pain.   albuterol  108 (90 Base) MCG/ACT inhaler Commonly known as: VENTOLIN  HFA Inhale 1-2 puffs into the lungs every 4 (four) hours as needed for wheezing or shortness of breath.   ammonium lactate 12 % cream Commonly known as: AMLACTIN Apply 1 Application topically 2 (two) times daily.   bismuth  subsalicylate 262 MG chewable tablet Commonly known as: PEPTO BISMOL Chew 524 mg by mouth as needed for indigestion or  diarrhea or loose stools.   cetirizine 10 MG tablet Commonly known as: ZYRTEC Take 10 mg by mouth daily.   docusate sodium  100 MG capsule Commonly known as: COLACE Take 100 mg by mouth 2 (two) times daily.   Eliquis  2.5 MG Tabs tablet Generic drug: apixaban  TAKE (1) TABLET BY MOUTH TWICE DAILY.   fluticasone  50 MCG/ACT nasal spray Commonly known as: FLONASE  Place 1 spray into both nostrils daily as needed for allergies or rhinitis.   FreeStyle Libre 2 Sensor Misc   insulin  glargine 100 UNIT/ML injection Commonly known as: LANTUS  Inject 0.4 mLs (40 Units total) into the skin at bedtime. Start taking on:  June 23, 2024   meclizine  25 MG tablet Commonly known as: ANTIVERT  Take 25 mg by mouth 3 (three) times daily as needed for dizziness.   meloxicam 7.5 MG tablet Commonly known as: MOBIC Take 7.5 mg by mouth daily.   NovoLOG  FlexPen 100 UNIT/ML FlexPen Generic drug: insulin  aspart Inject 10 Units into the skin 3 (three) times daily with meals.   omeprazole 40 MG capsule Commonly known as: PRILOSEC Take 40 mg by mouth daily as needed (GERD symptoms).   triamcinolone cream 0.5 % Commonly known as: KENALOG Apply 1 Application topically 2 (two) times daily as needed.        Follow-up Information     [name], [name], MD Follow up.   Specialty: Family Medicine Why: hospital follow up Contact information: [address] [facility] [address] [place] [address] [phone]                Discharge Exam: Filed Weights   06/18/24 1052  Weight: 101.6 kg   GEN: NAD SKIN: Warm and dry EYES: No pallor or icterus ENT: MMM CV: RRR PULM: Mild rales at the right lung base.  No wheezing ABD: soft, obese, NT, +BS CNS: AAO x 3, non focal EXT: Mild b/l leg edema, no erythema or tenderness   Condition at discharge: good  The results of significant diagnostics from this hospitalization (including imaging, microbiology, ancillary and laboratory) are listed below for reference.   Imaging Studies: CT Angio Chest Pulmonary Embolism (PE) W or WO Contrast Result Date: 06/21/2024 CLINICAL DATA:  Concern for pulmonary embolism. EXAM: CT ANGIOGRAPHY CHEST WITH CONTRAST TECHNIQUE: Multidetector CT imaging of the chest was performed using the standard protocol during bolus administration of intravenous contrast. Multiplanar CT image reconstructions and MIPs were obtained to evaluate the vascular anatomy. RADIATION DOSE REDUCTION: This exam was performed according to the departmental dose-optimization program which includes automated exposure control, adjustment of the mA and/or kV  according to patient size and/or use of iterative reconstruction technique. CONTRAST:  75mL OMNIPAQUE  IOHEXOL  350 MG/ML SOLN COMPARISON:  CT dated 05/07/2019. FINDINGS: Evaluation of this exam is limited due to respiratory motion. Cardiovascular: There is no cardiomegaly or pericardial effusion. Mild atherosclerotic calcification of the thoracic aorta. No aneurysmal dilatation or dissection. The origins of the great vessels of the aortic arch are patent. Evaluation of the pulmonary arteries is limited due to respiratory motion and suboptimal visualization of the peripheral branches. No central pulmonary artery embolus identified. Mediastinum/Nodes: Mildly enlarged right hilar lymph node measures 13 mm short axis. Subcarinal lymph node measures 18 mm. The esophagus is grossly unremarkable no mediastinal fluid collection. Lungs/Pleura: Trace right pleural effusion. An area of consolidative change at the right lung base consistent with pneumonia. Follow-up to resolution recommended. There is no pneumothorax. The central airways are patent. Upper Abdomen: No acute abnormality. Musculoskeletal:  No acute osseous pathology. Review of the MIP images confirms the above findings. IMPRESSION: 1. No CT evidence of central pulmonary artery embolus. 2. Right lower lobe pneumonia. Follow-up to resolution recommended. 3. Trace right pleural effusion. 4.  Aortic Atherosclerosis (ICD10-I70.0). Electronically Signed   By: Vanetta Chou M.D.   On: 06/21/2024 11:17   US  Venous Img Lower Unilateral Right Result Date: 06/18/2024 CLINICAL DATA:  RIGHT lower extremity swelling EXAM: RIGHT LOWER EXTREMITY VENOUS DOPPLER ULTRASOUND TECHNIQUE: Gray-scale sonography with compression, as well as color and duplex ultrasound, were performed to evaluate the deep venous system(s) from the level of the common femoral vein through the popliteal and proximal calf veins. COMPARISON:  06/08/2022 FINDINGS: VENOUS Normal compressibility of the  common femoral, superficial femoral, and popliteal veins, as well as the visualized calf veins. Visualized portions of profunda femoral vein and great saphenous vein unremarkable. No filling defects to suggest DVT on grayscale or color Doppler imaging. Doppler waveforms show normal direction of venous flow, normal respiratory plasticity and response to augmentation. Limited views of the contralateral common femoral vein are unremarkable. OTHER None. Limitations: Limited visualization of the RIGHT peroneal vein. IMPRESSION: No right lower extremity DVT. Electronically Signed   By: Aliene Lloyd M.D.   On: 06/18/2024 14:55   DG Chest 2 View Result Date: 06/18/2024 EXAM: 2 VIEW(S) XRAY OF THE CHEST 06/18/2024 11:29:00 AM COMPARISON: 02/25/2019. CLINICAL HISTORY: cp FINDINGS: LUNGS AND PLEURA: Patchy opacities in the posterior left lung base concerning for consolidation in the setting of pneumonia. No pleural effusion. No pneumothorax. HEART AND MEDIASTINUM: No acute abnormality of the cardiac and mediastinal silhouettes. BONES AND SOFT TISSUES: Thoracic degenerative changes. IMPRESSION: 1. Patchy opacities in the posterior left lung base concerning for left lower lobe pneumonia. Electronically signed by: Donnice Mania MD 06/18/2024 11:52 AM EST RP Workstation: [id number]    Microbiology: Results for orders placed or performed during the hospital encounter of 06/18/24  Blood Culture (routine x 2)     Status: None (Preliminary result)   Collection Time: 06/18/24  2:19 PM   Specimen: BLOOD  Result Value Ref Range Status   Specimen Description BLOOD RIGHT ANTECUBITAL  Final   Special Requests   Final    BOTTLES DRAWN AEROBIC AND ANAEROBIC Blood Culture results may not be optimal due to an inadequate volume of blood received in culture bottles   Culture   Final    NO GROWTH 4 DAYS Performed at [facility], [address] [place], [place] [address]    Report Status PENDING  Incomplete  Blood  Culture (routine x 2)     Status: None (Preliminary result)   Collection Time: 06/18/24  2:20 PM   Specimen: BLOOD  Result Value Ref Range Status   Specimen Description BLOOD BLOOD RIGHT HAND  Final   Special Requests   Final    BOTTLES DRAWN AEROBIC AND ANAEROBIC Blood Culture adequate volume   Culture   Final    NO GROWTH 4 DAYS Performed at [facility], [address], [place], [place] [address]    Report Status PENDING  Incomplete  Resp panel by RT-PCR (RSV, Flu A&B, Covid) Anterior Nasal Swab     Status: None   Collection Time: 06/19/24 12:50 AM   Specimen: Anterior Nasal Swab  Result Value Ref Range Status   SARS Coronavirus 2 by RT PCR NEGATIVE NEGATIVE Final    Comment: (NOTE) SARS-CoV-2 target nucleic acids are NOT DETECTED.  The SARS-CoV-2 RNA is generally detectable  in upper respiratory specimens during the acute phase of infection. The lowest concentration of SARS-CoV-2 viral copies this assay can detect is 138 copies/mL. A negative result does not preclude SARS-Cov-2 infection and should not be used as the sole basis for treatment or other patient management decisions. A negative result may occur with  improper specimen collection/handling, submission of specimen other than nasopharyngeal swab, presence of viral mutation(s) within the areas targeted by this assay, and inadequate number of viral copies(<138 copies/mL). A negative result must be combined with clinical observations, patient history, and epidemiological information. The expected result is Negative.  Fact Sheet for Patients:  bloggercourse.com  Fact Sheet for Healthcare Providers:  seriousbroker.it  This test is no t yet approved or cleared by the [place]  FDA and  has been authorized for detection and/or diagnosis of SARS-CoV-2 by FDA under an Emergency Use Authorization (EUA). This EUA will remain  in effect (meaning this test can be  used) for the duration of the COVID-19 declaration under Section 564(b)(1) of the Act, 21 U.S.C.section 360bbb-3(b)(1), unless the authorization is terminated  or revoked sooner.       Influenza A by PCR NEGATIVE NEGATIVE Final   Influenza B by PCR NEGATIVE NEGATIVE Final    Comment: (NOTE) The Xpert Xpress SARS-CoV-2/FLU/RSV plus assay is intended as an aid in the diagnosis of influenza from Nasopharyngeal swab specimens and should not be used as a sole basis for treatment. Nasal washings and aspirates are unacceptable for Xpert Xpress SARS-CoV-2/FLU/RSV testing.  Fact Sheet for Patients: bloggercourse.com  Fact Sheet for Healthcare Providers: seriousbroker.it  This test is not yet approved or cleared by the [place]  FDA and has been authorized for detection and/or diagnosis of SARS-CoV-2 by FDA under an Emergency Use Authorization (EUA). This EUA will remain in effect (meaning this test can be used) for the duration of the COVID-19 declaration under Section 564(b)(1) of the Act, 21 U.S.C. section 360bbb-3(b)(1), unless the authorization is terminated or revoked.     Resp Syncytial Virus by PCR NEGATIVE NEGATIVE Final    Comment: (NOTE) Fact Sheet for Patients: bloggercourse.com  Fact Sheet for Healthcare Providers: seriousbroker.it  This test is not yet approved or cleared by the [place]  FDA and has been authorized for detection and/or diagnosis of SARS-CoV-2 by FDA under an Emergency Use Authorization (EUA). This EUA will remain in effect (meaning this test can be used) for the duration of the COVID-19 declaration under Section 564(b)(1) of the Act, 21 U.S.C. section 360bbb-3(b)(1), unless the authorization is terminated or revoked.  Performed at [facility], [address] [place], [place] [address]   MRSA Next Gen by PCR, Nasal     Status:  Abnormal   Collection Time: 06/19/24  8:50 AM   Specimen: Nasal Mucosa; Nasal Swab  Result Value Ref Range Status   MRSA by PCR Next Gen DETECTED (A) NOT DETECTED Final    Comment: RESULT CALLED TO, READ BACK BY AND VERIFIED WITH: JOESPH GAMBLER RN [phone] 06/19/24 HNM (NOTE) The GeneXpert MRSA Assay (FDA approved for NASAL specimens only), is one component of a comprehensive MRSA colonization surveillance program. It is not intended to diagnose MRSA infection nor to guide or monitor treatment for MRSA infections. Test performance is not FDA approved in patients less than 63 years old. Performed at [facility], [address] [place], [place] [address]     Labs: CBC: Recent Labs  Lab 06/20/24 1356 06/20/24 1625 06/20/24 2226 06/21/24 0411 06/22/24 1013  WBC 7.9 7.3 7.8 7.1 8.0  HGB 12.8* 12.6* 12.4* 12.5* 13.0  HCT 36.9* 37.3* 36.9* 37.0* 39.1  MCV 87.9 89.7 89.3 90.5 88.9  PLT 227 264 258 239 259   Basic Metabolic Panel: Recent Labs  Lab 06/18/24 1057 06/19/24 0532 06/20/24 1356 06/22/24 1013  NA 134* 133*  --  137  K 3.6 4.3  --  4.4  CL 102 101  --  102  CO2 22 24  --  26  GLUCOSE 180* 316*  --  275*  BUN 13 13  --  13  CREATININE 0.87 0.90 1.01 0.92  CALCIUM  8.9 8.4*  --  9.2   Liver Function Tests: Recent Labs  Lab 06/18/24 1407 06/19/24 0532  AST 12* 13*  ALT 13 10  ALKPHOS 90 78  BILITOT 0.7 0.5  PROT 7.1 6.3*  ALBUMIN 3.8 3.3*   CBG: Recent Labs  Lab 06/21/24 0855 06/21/24 1220 06/21/24 1643 06/21/24 2028 06/22/24 0848  GLUCAP 235* 215* 110* 241* 261*    Discharge time spent: greater than 30 minutes.  Signed: AIDA CHO, MD [facility] 06/22/2024

## 2024-06-23 LAB — CULTURE, BLOOD (ROUTINE X 2)
Culture: NO GROWTH
Culture: NO GROWTH
Special Requests: ADEQUATE

## 2024-07-04 ENCOUNTER — Ambulatory Visit: Admitting: Podiatry

## 2024-07-04 DIAGNOSIS — Z91198 Patient's noncompliance with other medical treatment and regimen for other reason: Secondary | ICD-10-CM

## 2024-07-06 NOTE — Progress Notes (Signed)
 1. Failure to attend appointment with reason given    Appointment canceled and rescheduled.

## 2024-07-07 ENCOUNTER — Ambulatory Visit: Admitting: Podiatry

## 2024-09-29 ENCOUNTER — Ambulatory Visit: Admitting: Podiatry
